# Patient Record
Sex: Female | Born: 1950 | Race: White | Hispanic: No | Marital: Married | Smoking: Current some day smoker
Health system: Southern US, Community
[De-identification: ages and names within clinical notes are randomized; demographics above are authoritative.]

## PROBLEM LIST (undated history)

## (undated) DIAGNOSIS — I5032 Chronic diastolic (congestive) heart failure: Secondary | ICD-10-CM

## (undated) DIAGNOSIS — K219 Gastro-esophageal reflux disease without esophagitis: Secondary | ICD-10-CM

## (undated) DIAGNOSIS — E1340 Other specified diabetes mellitus with diabetic neuropathy, unspecified: Secondary | ICD-10-CM

## (undated) DIAGNOSIS — J387 Other diseases of larynx: Secondary | ICD-10-CM

## (undated) DIAGNOSIS — D497 Neoplasm of unspecified behavior of endocrine glands and other parts of nervous system: Secondary | ICD-10-CM

## (undated) DIAGNOSIS — R112 Nausea with vomiting, unspecified: Secondary | ICD-10-CM

## (undated) DIAGNOSIS — N186 End stage renal disease: Secondary | ICD-10-CM

## (undated) DIAGNOSIS — E1122 Type 2 diabetes mellitus with diabetic chronic kidney disease: Secondary | ICD-10-CM

## (undated) DIAGNOSIS — I509 Heart failure, unspecified: Secondary | ICD-10-CM

## (undated) DIAGNOSIS — E1121 Type 2 diabetes mellitus with diabetic nephropathy: Secondary | ICD-10-CM

## (undated) DIAGNOSIS — E785 Hyperlipidemia, unspecified: Secondary | ICD-10-CM

## (undated) DIAGNOSIS — Z9889 Other specified postprocedural states: Secondary | ICD-10-CM

## (undated) DIAGNOSIS — D649 Anemia, unspecified: Secondary | ICD-10-CM

## (undated) DIAGNOSIS — I700 Atherosclerosis of aorta: Secondary | ICD-10-CM

## (undated) DIAGNOSIS — G4730 Sleep apnea, unspecified: Secondary | ICD-10-CM

## (undated) DIAGNOSIS — E1140 Type 2 diabetes mellitus with diabetic neuropathy, unspecified: Secondary | ICD-10-CM

## (undated) DIAGNOSIS — I350 Nonrheumatic aortic (valve) stenosis: Secondary | ICD-10-CM

## (undated) DIAGNOSIS — Z973 Presence of spectacles and contact lenses: Secondary | ICD-10-CM

## (undated) DIAGNOSIS — Z992 Dependence on renal dialysis: Secondary | ICD-10-CM

## (undated) DIAGNOSIS — I4891 Unspecified atrial fibrillation: Principal | ICD-10-CM

## (undated) DIAGNOSIS — I10 Essential (primary) hypertension: Secondary | ICD-10-CM

## (undated) DIAGNOSIS — I2119 ST elevation (STEMI) myocardial infarction involving other coronary artery of inferior wall: Secondary | ICD-10-CM

## (undated) DIAGNOSIS — E13319 Other specified diabetes mellitus with unspecified diabetic retinopathy without macular edema: Secondary | ICD-10-CM

## (undated) DIAGNOSIS — I2510 Atherosclerotic heart disease of native coronary artery without angina pectoris: Secondary | ICD-10-CM

## (undated) DIAGNOSIS — J449 Chronic obstructive pulmonary disease, unspecified: Secondary | ICD-10-CM

## (undated) DIAGNOSIS — IMO0001 Reserved for inherently not codable concepts without codable children: Secondary | ICD-10-CM

## (undated) HISTORY — DX: Other diseases of larynx: J38.7

## (undated) HISTORY — PX: ACHILLES TENDON REPAIR: SUR1153

## (undated) HISTORY — PX: OTHER SURGICAL HISTORY: SHX169

## (undated) HISTORY — DX: Hyperlipidemia, unspecified: E78.5

## (undated) HISTORY — DX: Essential (primary) hypertension: I10

## (undated) HISTORY — PX: SHOULDER ARTHROSCOPY: SHX128

## (undated) HISTORY — DX: Anemia, unspecified: D64.9

## (undated) HISTORY — PX: APPENDECTOMY: SHX54

## (undated) HISTORY — PX: LARYNGECTOMY: SUR815

## (undated) HISTORY — PX: CARDIAC CATHETERIZATION: SHX172

## (undated) HISTORY — DX: Heart failure, unspecified: I50.9

## (undated) HISTORY — DX: Type 2 diabetes mellitus with diabetic nephropathy: E11.21

## (undated) HISTORY — PX: HEMORRHOID SURGERY: SHX153

## (undated) HISTORY — PX: CHOLECYSTECTOMY: SHX55

## (undated) HISTORY — PX: LUMBAR LAMINECTOMY: SHX95

## (undated) HISTORY — DX: Chronic obstructive pulmonary disease, unspecified: J44.9

---

## 1998-05-30 ENCOUNTER — Ambulatory Visit (HOSPITAL_BASED_OUTPATIENT_CLINIC_OR_DEPARTMENT_OTHER): Admission: RE | Admit: 1998-05-30 | Discharge: 1998-05-30 | Payer: Self-pay | Admitting: *Deleted

## 1999-06-26 ENCOUNTER — Encounter: Admission: RE | Admit: 1999-06-26 | Discharge: 1999-06-26 | Payer: Self-pay | Admitting: *Deleted

## 1999-06-28 ENCOUNTER — Inpatient Hospital Stay (HOSPITAL_COMMUNITY): Admission: RE | Admit: 1999-06-28 | Discharge: 1999-06-29 | Payer: Self-pay | Admitting: *Deleted

## 2001-07-29 ENCOUNTER — Ambulatory Visit (HOSPITAL_COMMUNITY): Admission: RE | Admit: 2001-07-29 | Discharge: 2001-07-29 | Payer: Self-pay | Admitting: Orthopedic Surgery

## 2001-07-29 ENCOUNTER — Encounter: Payer: Self-pay | Admitting: Orthopedic Surgery

## 2002-07-12 ENCOUNTER — Ambulatory Visit (HOSPITAL_BASED_OUTPATIENT_CLINIC_OR_DEPARTMENT_OTHER): Admission: RE | Admit: 2002-07-12 | Discharge: 2002-07-12 | Payer: Self-pay | Admitting: Orthopedic Surgery

## 2007-11-16 ENCOUNTER — Ambulatory Visit: Payer: Self-pay | Admitting: Cardiology

## 2007-12-16 ENCOUNTER — Ambulatory Visit: Payer: Self-pay | Admitting: Cardiology

## 2009-03-21 ENCOUNTER — Ambulatory Visit (HOSPITAL_COMMUNITY): Admission: RE | Admit: 2009-03-21 | Discharge: 2009-03-21 | Payer: Self-pay | Admitting: Family Medicine

## 2011-01-03 NOTE — Op Note (Signed)
NAME:  Anita [name], Anita [name]                          ACCOUNT NO.:  [id number]   MEDICAL RECORD NO.:  [id number]                   PATIENT TYPE:  AMB   LOCATION:  DSC                                  FACILITY:  [facility]   PHYSICIAN:  Leonides Grills, M.D.                  DATE OF BIRTH:  08/17/1951   DATE OF PROCEDURE:  DATE OF DISCHARGE:                                 OPERATIVE REPORT   PREOPERATIVE DIAGNOSIS:  Right Achilles tendinopathy.   POSTOPERATIVE DIAGNOSIS:  Same.   OPERATION:  1. Right FHL to calcaneus transfer.  2. Right FDL to FHL transfer.  3. Right Achilles tendon scarification.   ANESTHESIA:  General endotracheal tube.   SURGEON:  Dr. Leonides Grills.   ASSISTANT:  Lianne Cure, P.A.-C.   ESTIMATED BLOOD LOSS:  Minimal.   TOURNIQUET TIME:  One hour and 15 minutes.   COMPLICATIONS:  None.   DISPOSITION:  Stable to PR.   INDICATIONS FOR PROCEDURE:  This is a 60 year old female who has had  persistent, long-standing Achilles tendinopathy that was resistant to  conservative management.  She was consented for the above procedure.  All  risks which include infection, neurovascular injury, persistent pain,  worsening of pain, rupture of the tendon, stiffness, contracture were all  explained, questions were encouraged and answered.   DESCRIPTION OF PROCEDURE:  The patient was brought to the operating room and  placed in the supine position after adequate general endotracheal tube  anesthesia was administered as well as Ancef one gram IV piggyback.  A  tourniquet was placed on the proximal thigh, right lower extremity, and the  patient was then placed in the lateral decubitus position with the operative  side down.  All bony prominences were well padded.  Axillary roll was  placed.  We then prepped and draped the left lower extremity in the sterile  manner.  Gravity exsanguinated the left lower extremity.  The tourniquet was  elevated to 290 mmHg.  A longitudinal  incision over the anteromedial border  of the Achilles tendon was then made.  Dissection was carried down to the  Achilles tendon.  The tendon was then identified and was stenosed, we then  scarified the tendon. Once the tendon was scarified, with longitudinal slits  in the Achilles tendon and debridement, we then identified the FHL tendon  and released the fascia posteriorly to the tendon.  Hemostasis was obtained  during the exposure.  We then made a longitudinal incision over the  posterior tibial tendon, posterior tibial tendon was then identified.  FDL  tendon was then identified and traced to knot of Sherilyn Cooter.  At this point, both  the FHL and FDL tendons were identified, and with the foot in neutral  dorsiflexion as well as toes in neutral dorsiflexion, we then transferred to  the FDL to FHL tendon with 2-0 fiber wire.  After this was done,  we then  tenotomized the FHL tendon in the wound, proximal to the knot of the  transfer, and pulled this through the more proximal wound.  We then  identified the adequate amount of tension on the FHL once this was elevated  off the posterior aspect of the tibia bluntly.  We then drilled a hole in  the calcaneal tuber midline, just anterior to the Achilles tendon.  We then  placed fiber wire in a running manner, both on the medial and lateral  aspects of the FHL tendon respectively.  We then placed the tendon in the  hole with the ankle in equinus, with the adequate amount of tension on the  tendon, and this was then secured and tensioned using an [employer] tenodesis  9 mm bioabsorbable screw.  This had excellent purchase and tension on the  Achilles tendon.  Muscle belly was anterior to the Achilles tendon for  better blood supply.  The paratenon of the Achilles tendon was then sewn to  the epimysium with 4-0 PDS.  Ankle was then ranged and had excellent  maintenance of the FHL to the anterior aspect of the Achilles tendon and  scarified area, and  did not have any contracture.  The wounds were copiously  irrigated with normal saline.  Subcu was closed with 3-0 Vicryl.  Skin was  closed with 4-0 nylon.  Tourniquet was deflated prior to closure and  hemostasis was obtained.  Sterile dressings were applied, modified Achilles  dressing was applied with the ankle in gravity equinus.  The patient was  stable to the PR.                                               Leonides Grills, M.D.    PB/MEDQ  D:  07/12/2002  T:  07/12/2002  Job:  [id number]

## 2011-06-06 ENCOUNTER — Encounter (INDEPENDENT_AMBULATORY_CARE_PROVIDER_SITE_OTHER): Payer: Self-pay | Admitting: Ophthalmology

## 2011-06-06 DIAGNOSIS — H2510 Age-related nuclear cataract, unspecified eye: Secondary | ICD-10-CM

## 2011-06-06 DIAGNOSIS — E11319 Type 2 diabetes mellitus with unspecified diabetic retinopathy without macular edema: Secondary | ICD-10-CM

## 2011-06-06 DIAGNOSIS — H43819 Vitreous degeneration, unspecified eye: Secondary | ICD-10-CM

## 2011-08-20 ENCOUNTER — Ambulatory Visit (HOSPITAL_COMMUNITY)
Admission: RE | Admit: 2011-08-20 | Discharge: 2011-08-20 | Disposition: A | Discharge status: Home / Self Care | Payer: Medicare Other | Source: Ambulatory Visit | Attending: Family Medicine | Admitting: Family Medicine

## 2011-08-20 DIAGNOSIS — R011 Cardiac murmur, unspecified: Secondary | ICD-10-CM | POA: Insufficient documentation

## 2011-08-20 DIAGNOSIS — I359 Nonrheumatic aortic valve disorder, unspecified: Secondary | ICD-10-CM | POA: Insufficient documentation

## 2011-08-20 NOTE — Progress Notes (Signed)
*  PRELIMINARY RESULTS* Echocardiogram 2D Echocardiogram has been performed.  Anita [name] 08/20/2011, 9:37 AM

## 2011-10-22 DIAGNOSIS — E669 Obesity, unspecified: Secondary | ICD-10-CM | POA: Insufficient documentation

## 2011-12-05 ENCOUNTER — Ambulatory Visit (INDEPENDENT_AMBULATORY_CARE_PROVIDER_SITE_OTHER): Payer: Medicare Other | Admitting: Ophthalmology

## 2011-12-05 DIAGNOSIS — H35039 Hypertensive retinopathy, unspecified eye: Secondary | ICD-10-CM

## 2011-12-05 DIAGNOSIS — H43819 Vitreous degeneration, unspecified eye: Secondary | ICD-10-CM

## 2011-12-05 DIAGNOSIS — H2510 Age-related nuclear cataract, unspecified eye: Secondary | ICD-10-CM

## 2011-12-05 DIAGNOSIS — E11319 Type 2 diabetes mellitus with unspecified diabetic retinopathy without macular edema: Secondary | ICD-10-CM

## 2011-12-05 DIAGNOSIS — I10 Essential (primary) hypertension: Secondary | ICD-10-CM

## 2011-12-05 DIAGNOSIS — E1139 Type 2 diabetes mellitus with other diabetic ophthalmic complication: Secondary | ICD-10-CM

## 2011-12-05 DIAGNOSIS — H43319 Vitreous membranes and strands, unspecified eye: Secondary | ICD-10-CM

## 2012-05-19 ENCOUNTER — Ambulatory Visit (INDEPENDENT_AMBULATORY_CARE_PROVIDER_SITE_OTHER): Payer: Medicare Other | Admitting: Ophthalmology

## 2012-11-30 ENCOUNTER — Other Ambulatory Visit (HOSPITAL_COMMUNITY): Payer: Self-pay | Admitting: Nephrology

## 2012-11-30 DIAGNOSIS — R011 Cardiac murmur, unspecified: Secondary | ICD-10-CM

## 2012-12-01 ENCOUNTER — Other Ambulatory Visit: Payer: Self-pay | Admitting: *Deleted

## 2012-12-01 ENCOUNTER — Other Ambulatory Visit (HOSPITAL_COMMUNITY): Payer: Self-pay | Admitting: Nephrology

## 2012-12-01 DIAGNOSIS — Z01810 Encounter for preprocedural cardiovascular examination: Secondary | ICD-10-CM

## 2012-12-01 DIAGNOSIS — N184 Chronic kidney disease, stage 4 (severe): Secondary | ICD-10-CM

## 2012-12-02 ENCOUNTER — Encounter: Payer: Self-pay | Admitting: Vascular Surgery

## 2012-12-03 ENCOUNTER — Ambulatory Visit (HOSPITAL_COMMUNITY)
Admission: RE | Admit: 2012-12-03 | Discharge: 2012-12-03 | Disposition: A | Discharge status: Home / Self Care | Payer: Medicare Other | Source: Ambulatory Visit | Attending: Nephrology | Admitting: Nephrology

## 2012-12-03 DIAGNOSIS — E119 Type 2 diabetes mellitus without complications: Secondary | ICD-10-CM | POA: Insufficient documentation

## 2012-12-03 DIAGNOSIS — R011 Cardiac murmur, unspecified: Secondary | ICD-10-CM | POA: Insufficient documentation

## 2012-12-03 DIAGNOSIS — I359 Nonrheumatic aortic valve disorder, unspecified: Secondary | ICD-10-CM

## 2012-12-03 DIAGNOSIS — I509 Heart failure, unspecified: Secondary | ICD-10-CM | POA: Insufficient documentation

## 2012-12-03 NOTE — Progress Notes (Signed)
*  PRELIMINARY RESULTS* Echocardiogram 2D Echocardiogram has been performed.  Anita [name] 12/03/2012, 10:10 AM

## 2012-12-22 ENCOUNTER — Encounter: Payer: Self-pay | Admitting: Vascular Surgery

## 2012-12-23 ENCOUNTER — Encounter (INDEPENDENT_AMBULATORY_CARE_PROVIDER_SITE_OTHER): Payer: Medicare Other

## 2012-12-23 ENCOUNTER — Other Ambulatory Visit: Payer: Self-pay

## 2012-12-23 ENCOUNTER — Encounter: Payer: Self-pay | Admitting: Vascular Surgery

## 2012-12-23 ENCOUNTER — Encounter (HOSPITAL_COMMUNITY): Payer: Self-pay | Admitting: Pharmacy Technician

## 2012-12-23 ENCOUNTER — Ambulatory Visit (INDEPENDENT_AMBULATORY_CARE_PROVIDER_SITE_OTHER): Payer: Medicare Other | Admitting: Vascular Surgery

## 2012-12-23 VITALS — BP 188/60 | HR 59 | Resp 16 | Ht 66.5 in | Wt 243.0 lb

## 2012-12-23 DIAGNOSIS — N184 Chronic kidney disease, stage 4 (severe): Secondary | ICD-10-CM

## 2012-12-23 DIAGNOSIS — I350 Nonrheumatic aortic (valve) stenosis: Secondary | ICD-10-CM

## 2012-12-23 DIAGNOSIS — Z01810 Encounter for preprocedural cardiovascular examination: Secondary | ICD-10-CM

## 2012-12-23 DIAGNOSIS — N289 Disorder of kidney and ureter, unspecified: Secondary | ICD-10-CM

## 2012-12-23 DIAGNOSIS — I359 Nonrheumatic aortic valve disorder, unspecified: Secondary | ICD-10-CM

## 2012-12-23 DIAGNOSIS — N186 End stage renal disease: Secondary | ICD-10-CM

## 2012-12-23 HISTORY — DX: Nonrheumatic aortic (valve) stenosis: I35.0

## 2012-12-23 NOTE — Progress Notes (Signed)
[facility] [place] HISTORY AND PHYSICAL  Referring: Hyman Hopes M.D. History of Present Illness:  Patient is a 62 y.o. year old female who presents for placement of a permanent hemodialysis access. The patient is right handed .  The patient is not currently on hemodialysis.  The cause of renal failure is thought to be secondary to diabetes.  Other chronic medical problems include peripheral neuropathy, hypertension, hyperlipidemia COPD, ll of which are currently stable. The patient has been experiencing some fatigue recently. She has also had some slight decrease of appetite. She denies any skin itching. She is not currently on hemodialysis  Past Medical History  Diagnosis Date  . Peripheral neuropathy   . Heart murmur   . ESRD (end stage renal disease)     Chronic Renal Insufficiency  . Diabetic nephropathy   . Diabetes mellitus without complication   . Proteinuria     Progression  . Hypertension   . Anemia   . Dyslipidemia   . Laryngeal mass   . COPD (chronic obstructive pulmonary disease)   . CHF (congestive heart failure)     Past Surgical History  Procedure Laterality Date  . Cholecystectomy    . Appendectomy    . Shoulder arthroscopy Right     w/ repair of rotator cuff repair  . Elbow tendon surgery Right   . Achilles tendon repair Right   . Cesarean section      X 2      Social History History  Substance Use Topics  . Smoking status: Current Every Day Smoker -- 1.00 packs/day for 40 years    Types: Cigarettes  . Smokeless tobacco: Never Used  . Alcohol Use: No    Family History Family History  Problem Relation Age of Onset  . COPD Mother   . Multiple sclerosis Father   . Depression Brother     Suicide  . Heart disease Brother     Allergies  Allergies  Allergen Reactions  . Doxycycline   . Lipitor (Atorvastatin)      Current Outpatient Prescriptions  Medication Sig Dispense Refill  . atenolol (TENORMIN) 50 MG tablet Take 50 mg  by mouth daily.      . calcitRIOL (ROCALTROL) 0.25 MCG capsule Take 0.25 mcg by mouth daily.      Marland Kitchen Cinnamon ([employer] CINNAMON) 500 MG capsule Take 500 mg by mouth daily.      . cloNIDine (CATAPRES) 0.2 MG tablet Take 0.2 mg by mouth 2 (two) times daily.      . Cyanocobalamin (VITAMIN B 12 PO) Take 1,000 mg by mouth daily.      . folic acid (FOLVITE) 800 MCG tablet Take 400 mcg by mouth daily.      . hydrALAZINE (APRESOLINE) 25 MG tablet Take 25 mg by mouth 2 (two) times daily.       Marland Kitchen HYDROcodone-acetaminophen (LORTAB) 10-500 MG per tablet Take 1 tablet by mouth every 6 (six) hours as needed for pain.      Marland Kitchen lisinopril (PRINIVIL,ZESTRIL) 20 MG tablet Take 20 mg by mouth daily.      Marland Kitchen LORazepam (ATIVAN) 1 MG tablet Take 1 mg by mouth every 8 (eight) hours.      Marland Kitchen omeprazole (PRILOSEC) 20 MG capsule Take 20 mg by mouth daily.      . paricalcitol (ZEMPLAR) 1 MCG capsule Take 1 mcg by mouth daily.      . saxagliptin HCl (ONGLYZA) 5 MG TABS tablet Take by mouth daily.      Marland Kitchen  sodium bicarbonate 650 MG tablet Take 650 mg by mouth 4 (four) times daily.      . Vitamin D, Ergocalciferol, (DRISDOL) 50000 UNITS CAPS Take 50,000 Units by mouth.      . zolpidem (AMBIEN) 10 MG tablet Take 10 mg by mouth at bedtime as needed for sleep.       No current facility-administered medications for this visit.    ROS:   General:  No weight loss, Fever, chills  HEENT: No recent headaches, no nasal bleeding, no visual changes, no sore throat  Neurologic: No dizziness, blackouts, seizures. No recent symptoms of stroke or mini- stroke. No recent episodes of slurred speech, or temporary blindness.  Cardiac: No recent episodes of chest pain/pressure, no shortness of breath at rest.  + shortness of breath with exertion.  Denies history of atrial fibrillation or irregular heartbeat  Vascular: No history of rest pain in feet.  No history of claudication.  No history of non-healing ulcer, No history of DVT   Pulmonary: No  home oxygen, no productive cough, no hemoptysis,  No asthma or wheezing  Musculoskeletal:  [ ]  Arthritis, [ ]  Low back pain,  [ ]  Joint pain  Hematologic:No history of hypercoagulable state.  No history of easy bleeding.  No history of anemia  Gastrointestinal: No hematochezia or melena,  No gastroesophageal reflux, no trouble swallowing  Urinary: [x ] chronic Kidney disease, [ ]  on HD - [ ]  MWF or [ ]  TTHS, [ ]  Burning with urination, [ ]  Frequent urination, [ ]  Difficulty urinating;   Skin: No rashes  Psychological: No history of anxiety,  No history of depression   Physical Examination  Filed Vitals:   12/23/12 1256  BP: 188/60  Pulse: 59  Resp: 16  Height: 5' 6.5" (1.689 m)  Weight: 243 lb (110.224 kg)  SpO2: 99%    Body mass index is 38.64 kg/(m^2).  General:  Alert and oriented, no acute distress HEENT: Normal Neck: No bruit or JVD Pulmonary: Clear to auscultation bilaterally Cardiac: Regular Rate and Rhythm with 3/6 systolic murmur heard throughout the precordium Gastrointestinal: Soft, non-tender, non-distended, no mass, obese Skin: No rash Extremity Pulses:  2+ radial, brachial pulses bilaterally Musculoskeletal: No deformity trace ankle edema  Neurologic: Upper and lower extremity motor 5/5 and symmetric  DATA: Patient had bilateral vein mapping ultrasound today. I reviewed and interpreted this study. The cephalic vein in the forearm is fairly small. It is of good quality and the upper arm bilaterally greater than 4 mm in diameter. The basilic vein in the right is also greater than 4 mm the basilic vein in the left is fairly small  Recent echocardiogram results were reviewed which shows aortic stenosis with mild regurgitation normal ejection fraction some diastolic dysfunction   ASSESSMENT: Needs hemodialysis access.   PLAN:  Left brachiocephalic AV fistula Wednesday, 12/29/2012.  Risks benefits possible complications and procedure details the fistula  placement including but not limited to non-maturation ischemic steal bleeding and infection were explained to the patient today.  She understands and agrees to proceed.  Fabienne Bruns, MD [facility] [place] Office: [phone] Pager: [phone]

## 2012-12-28 ENCOUNTER — Encounter (HOSPITAL_COMMUNITY)
Admission: RE | Admit: 2012-12-28 | Discharge: 2012-12-28 | Disposition: A | Discharge status: Home / Self Care | Payer: Medicare Other | Source: Ambulatory Visit | Attending: Vascular Surgery | Admitting: Vascular Surgery

## 2012-12-28 ENCOUNTER — Encounter (HOSPITAL_COMMUNITY): Payer: Self-pay

## 2012-12-28 ENCOUNTER — Ambulatory Visit (HOSPITAL_COMMUNITY)
Admission: RE | Admit: 2012-12-28 | Discharge: 2012-12-28 | Disposition: A | Discharge status: Home / Self Care | Payer: Medicare Other | Source: Ambulatory Visit | Attending: Anesthesiology | Admitting: Anesthesiology

## 2012-12-28 HISTORY — DX: Nausea with vomiting, unspecified: R11.2

## 2012-12-28 HISTORY — DX: Other specified postprocedural states: Z98.890

## 2012-12-28 LAB — BASIC METABOLIC PANEL
BUN: 47 mg/dL — ABNORMAL HIGH (ref 6–23)
Calcium: 7.9 mg/dL — ABNORMAL LOW (ref 8.4–10.5)
Chloride: 108 mEq/L (ref 96–112)
Creatinine, Ser: 4.49 mg/dL — ABNORMAL HIGH (ref 0.50–1.10)
GFR calc Af Amer: 11 mL/min — ABNORMAL LOW (ref >90)

## 2012-12-28 LAB — CBC
HCT: 31.5 % — ABNORMAL LOW (ref 36.0–46.0)
MCHC: 32.7 g/dL (ref 30.0–36.0)
Platelets: 187 10*3/uL (ref 150–400)
RDW: 14.1 % (ref 11.5–15.5)

## 2012-12-28 LAB — SURGICAL PCR SCREEN: MRSA, PCR: NEGATIVE

## 2012-12-28 MED ORDER — DEXTROSE 5 % IV SOLN
1.5000 g | INTRAVENOUS | Status: AC
  Administered 2012-12-29: 1.5 g via INTRAVENOUS
  Filled 2012-12-28: qty 1.5

## 2012-12-28 NOTE — Pre-Procedure Instructions (Signed)
Anita [name]  12/28/2012   Your procedure is scheduled on:  Dec 29, 2012  Report to [facility] at 8:30 AM.  Call this number if you have problems the morning of surgery: [phone]   Remember:   Do not eat food or drink liquids after midnight.   Take these medicines the morning of surgery with A SIP OF WATER: atenolol (TENORMIN) 50 MG tablet, cloNIDine (CATAPRES) 0.2 MG tablet, hydrALAZINE (APRESOLINE) 25 MG tablet, HYDROcodone-acetaminophen (NORCO) 7.5-325 MG per tablet, LORazepam (ATIVAN) 1 MG tablet, omeprazole (PRILOSEC) 20 MG capsule   Do not wear jewelry, make-up or nail polish.  Do not wear lotions, powders, or perfumes. You may wear deodorant.  Do not shave 48 hours prior to surgery. Men may shave face and neck.  Do not bring valuables to the hospital.  Contacts, dentures or bridgework may not be worn into surgery.  Leave suitcase in the car. After surgery it may be brought to your room.  For patients admitted to the hospital, checkout time is 11:00 AM the day of  discharge.   Patients discharged the day of surgery will not be allowed to drive  home.  Name and phone number of your driver:   Special Instructions: Shower using CHG 2 nights before surgery and the night before surgery.  If you shower the day of surgery use CHG.  Use special wash - you have one bottle of CHG for all showers.  You should use approximately 1/3 of the bottle for each shower.   Please read over the following fact sheets that you were given: Pain Booklet, Coughing and Deep Breathing and Surgical Site Infection Prevention

## 2012-12-29 ENCOUNTER — Telehealth: Payer: Self-pay | Admitting: Vascular Surgery

## 2012-12-29 ENCOUNTER — Encounter (HOSPITAL_COMMUNITY): Payer: Self-pay | Admitting: Anesthesiology

## 2012-12-29 ENCOUNTER — Encounter (HOSPITAL_COMMUNITY)
Admission: RE | Disposition: A | Discharge status: Home / Self Care | Payer: Self-pay | Source: Ambulatory Visit | Attending: Vascular Surgery

## 2012-12-29 ENCOUNTER — Ambulatory Visit (HOSPITAL_COMMUNITY)
Admission: RE | Admit: 2012-12-29 | Discharge: 2012-12-29 | Disposition: A | Discharge status: Home / Self Care | Payer: Medicare Other | Source: Ambulatory Visit | Attending: Vascular Surgery | Admitting: Vascular Surgery

## 2012-12-29 ENCOUNTER — Ambulatory Visit (HOSPITAL_COMMUNITY): Payer: Medicare Other | Admitting: Anesthesiology

## 2012-12-29 ENCOUNTER — Other Ambulatory Visit: Payer: Self-pay | Admitting: *Deleted

## 2012-12-29 DIAGNOSIS — E785 Hyperlipidemia, unspecified: Secondary | ICD-10-CM | POA: Insufficient documentation

## 2012-12-29 DIAGNOSIS — N058 Unspecified nephritic syndrome with other morphologic changes: Secondary | ICD-10-CM | POA: Insufficient documentation

## 2012-12-29 DIAGNOSIS — N186 End stage renal disease: Secondary | ICD-10-CM

## 2012-12-29 DIAGNOSIS — J449 Chronic obstructive pulmonary disease, unspecified: Secondary | ICD-10-CM | POA: Insufficient documentation

## 2012-12-29 DIAGNOSIS — G609 Hereditary and idiopathic neuropathy, unspecified: Secondary | ICD-10-CM | POA: Insufficient documentation

## 2012-12-29 DIAGNOSIS — F17200 Nicotine dependence, unspecified, uncomplicated: Secondary | ICD-10-CM | POA: Insufficient documentation

## 2012-12-29 DIAGNOSIS — I120 Hypertensive chronic kidney disease with stage 5 chronic kidney disease or end stage renal disease: Secondary | ICD-10-CM | POA: Insufficient documentation

## 2012-12-29 DIAGNOSIS — J4489 Other specified chronic obstructive pulmonary disease: Secondary | ICD-10-CM | POA: Insufficient documentation

## 2012-12-29 DIAGNOSIS — I509 Heart failure, unspecified: Secondary | ICD-10-CM | POA: Insufficient documentation

## 2012-12-29 DIAGNOSIS — Z4931 Encounter for adequacy testing for hemodialysis: Secondary | ICD-10-CM

## 2012-12-29 DIAGNOSIS — E1129 Type 2 diabetes mellitus with other diabetic kidney complication: Secondary | ICD-10-CM | POA: Insufficient documentation

## 2012-12-29 DIAGNOSIS — N289 Disorder of kidney and ureter, unspecified: Secondary | ICD-10-CM

## 2012-12-29 DIAGNOSIS — R011 Cardiac murmur, unspecified: Secondary | ICD-10-CM | POA: Insufficient documentation

## 2012-12-29 HISTORY — PX: AV FISTULA PLACEMENT: SHX1204

## 2012-12-29 LAB — POCT I-STAT 4, (NA,K, GLUC, HGB,HCT): Potassium: 4.6 mEq/L (ref 3.5–5.1)

## 2012-12-29 SURGERY — ARTERIOVENOUS (AV) FISTULA CREATION
Anesthesia: Monitor Anesthesia Care | Site: Arm Upper | Laterality: Left | Wound class: Clean

## 2012-12-29 MED ORDER — CEFUROXIME SODIUM 750 MG IJ SOLR
INTRAMUSCULAR | Status: AC
  Filled 2012-12-29: qty 1500

## 2012-12-29 MED ORDER — OXYCODONE HCL 5 MG PO TABS
ORAL_TABLET | ORAL | Status: AC
  Administered 2012-12-29: 5 mg via ORAL
  Filled 2012-12-29: qty 1

## 2012-12-29 MED ORDER — 0.9 % SODIUM CHLORIDE (POUR BTL) OPTIME
TOPICAL | Status: DC | PRN
  Administered 2012-12-29: 1000 mL

## 2012-12-29 MED ORDER — SODIUM CHLORIDE 0.9 % IV SOLN
INTRAVENOUS | Status: DC | PRN
  Administered 2012-12-29: 12:00:00 via INTRAVENOUS

## 2012-12-29 MED ORDER — HEPARIN SODIUM (PORCINE) 1000 UNIT/ML IJ SOLN
INTRAMUSCULAR | Status: DC | PRN
  Administered 2012-12-29: 5000 [IU] via INTRAVENOUS

## 2012-12-29 MED ORDER — THROMBIN 20000 UNITS EX SOLR
CUTANEOUS | Status: AC
  Filled 2012-12-29: qty 20000

## 2012-12-29 MED ORDER — HYDROCODONE-ACETAMINOPHEN 7.5-325 MG PO TABS: 1.0000 | ORAL_TABLET | Freq: Four times a day (QID) | ORAL | Status: DC | PRN

## 2012-12-29 MED ORDER — MIDAZOLAM HCL 5 MG/5ML IJ SOLN
INTRAMUSCULAR | Status: DC | PRN
  Administered 2012-12-29 (×2): 1 mg via INTRAVENOUS

## 2012-12-29 MED ORDER — SODIUM CHLORIDE 0.9 % IR SOLN
Status: DC | PRN
  Administered 2012-12-29: 13:00:00

## 2012-12-29 MED ORDER — FENTANYL CITRATE 0.05 MG/ML IJ SOLN
INTRAMUSCULAR | Status: DC | PRN
  Administered 2012-12-29 (×4): 50 ug via INTRAVENOUS

## 2012-12-29 MED ORDER — HYDROMORPHONE HCL PF 1 MG/ML IJ SOLN: 0.2500 mg | INTRAMUSCULAR | Status: DC | PRN

## 2012-12-29 MED ORDER — OXYCODONE HCL 5 MG PO TABS: 5.0000 mg | ORAL_TABLET | Freq: Once | ORAL | Status: AC | PRN

## 2012-12-29 MED ORDER — ONDANSETRON HCL 4 MG/2ML IJ SOLN
INTRAMUSCULAR | Status: DC | PRN
  Administered 2012-12-29: 4 mg via INTRAVENOUS

## 2012-12-29 MED ORDER — LIDOCAINE HCL (PF) 1 % IJ SOLN
INTRAMUSCULAR | Status: DC | PRN
  Administered 2012-12-29: 30 mL via INTRADERMAL

## 2012-12-29 MED ORDER — PROMETHAZINE HCL 25 MG/ML IJ SOLN: 6.2500 mg | INTRAMUSCULAR | Status: DC | PRN

## 2012-12-29 MED ORDER — LIDOCAINE HCL (PF) 1 % IJ SOLN
INTRAMUSCULAR | Status: AC
  Filled 2012-12-29: qty 30

## 2012-12-29 MED ORDER — OXYCODONE HCL 5 MG/5ML PO SOLN: 5.0000 mg | Freq: Once | ORAL | Status: AC | PRN

## 2012-12-29 MED ORDER — PROPOFOL INFUSION 10 MG/ML OPTIME
INTRAVENOUS | Status: DC | PRN
  Administered 2012-12-29: 25 ug/kg/min via INTRAVENOUS

## 2012-12-29 MED ORDER — SODIUM CHLORIDE 0.9 % IV SOLN
INTRAVENOUS | Status: DC
  Administered 2012-12-29: 12:00:00 via INTRAVENOUS

## 2012-12-29 SURGICAL SUPPLY — 40 items
ADH SKN CLS APL DERMABOND .7 (GAUZE/BANDAGES/DRESSINGS) ×1
ARMBAND PINK RESTRICT EXTREMIT (MISCELLANEOUS) ×2 IMPLANT
CANISTER SUCTION 2500CC (MISCELLANEOUS) ×2 IMPLANT
CLIP TI MEDIUM 6 (CLIP) ×2 IMPLANT
CLIP TI WIDE RED SMALL 6 (CLIP) ×2 IMPLANT
CLOTH BEACON ORANGE TIMEOUT ST (SAFETY) ×2 IMPLANT
COVER PROBE W GEL 5X96 (DRAPES) ×2 IMPLANT
COVER SURGICAL LIGHT HANDLE (MISCELLANEOUS) ×2 IMPLANT
DECANTER SPIKE VIAL GLASS SM (MISCELLANEOUS) ×2 IMPLANT
DERMABOND ADVANCED (GAUZE/BANDAGES/DRESSINGS) ×1
DERMABOND ADVANCED .7 DNX12 (GAUZE/BANDAGES/DRESSINGS) ×1 IMPLANT
DRAIN PENROSE 1/4X12 LTX STRL (WOUND CARE) ×2 IMPLANT
ELECT REM PT RETURN 9FT ADLT (ELECTROSURGICAL) ×2
ELECTRODE REM PT RTRN 9FT ADLT (ELECTROSURGICAL) ×1 IMPLANT
GEL ULTRASOUND 20GR AQUASONIC (MISCELLANEOUS) IMPLANT
GLOVE BIO SURGEON STRL SZ 6.5 (GLOVE) ×2 IMPLANT
GLOVE BIO SURGEON STRL SZ7.5 (GLOVE) ×2 IMPLANT
GLOVE BIOGEL PI IND STRL 6 (GLOVE) IMPLANT
GLOVE BIOGEL PI IND STRL 6.5 (GLOVE) IMPLANT
GLOVE BIOGEL PI INDICATOR 6 (GLOVE) ×1
GLOVE BIOGEL PI INDICATOR 6.5 (GLOVE) ×2
GLOVE SURG SS PI 6.5 STRL IVOR (GLOVE) ×1 IMPLANT
GOWN PREVENTION PLUS XLARGE (GOWN DISPOSABLE) ×2 IMPLANT
GOWN STRL NON-REIN LRG LVL3 (GOWN DISPOSABLE) ×4 IMPLANT
KIT BASIN OR (CUSTOM PROCEDURE TRAY) ×2 IMPLANT
KIT ROOM TURNOVER OR (KITS) ×2 IMPLANT
LOOP VESSEL MINI RED (MISCELLANEOUS) IMPLANT
NS IRRIG 1000ML POUR BTL (IV SOLUTION) ×2 IMPLANT
PACK CV ACCESS (CUSTOM PROCEDURE TRAY) ×2 IMPLANT
PAD ARMBOARD 7.5X6 YLW CONV (MISCELLANEOUS) ×4 IMPLANT
SPONGE SURGIFOAM ABS GEL 100 (HEMOSTASIS) IMPLANT
SUT PROLENE 6 0 CC (SUTURE) ×1 IMPLANT
SUT PROLENE 7 0 BV 1 (SUTURE) ×2 IMPLANT
SUT VIC AB 3-0 SH 27 (SUTURE) ×2
SUT VIC AB 3-0 SH 27X BRD (SUTURE) ×1 IMPLANT
SUT VICRYL 4-0 PS2 18IN ABS (SUTURE) ×2 IMPLANT
TOWEL OR 17X24 6PK STRL BLUE (TOWEL DISPOSABLE) ×2 IMPLANT
TOWEL OR 17X26 10 PK STRL BLUE (TOWEL DISPOSABLE) ×2 IMPLANT
UNDERPAD 30X30 INCONTINENT (UNDERPADS AND DIAPERS) ×2 IMPLANT
WATER STERILE IRR 1000ML POUR (IV SOLUTION) ×2 IMPLANT

## 2012-12-29 NOTE — Telephone Encounter (Addendum)
Message copied by Fredrich Birks on Wed Dec 29, 2012  3:03 PM ------      Message from: [place], [place] K      Created: Wed Dec 29, 2012  1:33 PM      Regarding: schedule                   ----- Message -----         From: Dara Lords, PA-C         Sent: 12/29/2012   1:15 PM           To: Sharee Pimple, CMA            S/p left brachiocephalic AVF 12/29/12.  F/u with Dr. Darrick Penna in 4-6 weeks.            Thanks,      Samantha ------  12/29/12: unable to reach patient, mailed letter, dpm

## 2012-12-29 NOTE — Preoperative (Signed)
Beta Blockers   Reason not to administer Beta Blockers:Not Applicable 

## 2012-12-29 NOTE — Anesthesia Postprocedure Evaluation (Signed)
Anesthesia Post Note  Patient: Anita [name]  Procedure(s) Performed: Procedure(s) (LRB): ARTERIOVENOUS (AV) FISTULA CREATION (Left)  Anesthesia type: MAC  Patient location: PACU  Post pain: Pain level controlled  Post assessment: Patient's Cardiovascular Status Stable  Last Vitals:  Filed Vitals:   12/29/12 1445  BP: 171/73  Pulse: 57  Temp: 36.6 C  Resp: 20    Post vital signs: Reviewed and stable  Level of consciousness: sedated  Complications: No apparent anesthesia complications

## 2012-12-29 NOTE — H&P (View-Only) (Signed)
[facility] [place] HISTORY AND PHYSICAL  Referring: Webb M.D. History of Present Illness:  Patient is a 61 y.o. year old female who presents for placement of a permanent hemodialysis access. The patient is right handed .  The patient is not currently on hemodialysis.  The cause of renal failure is thought to be secondary to diabetes.  Other chronic medical problems include peripheral neuropathy, hypertension, hyperlipidemia COPD, ll of which are currently stable. The patient has been experiencing some fatigue recently. She has also had some slight decrease of appetite. She denies any skin itching. She is not currently on hemodialysis  Past Medical History  Diagnosis Date  . Peripheral neuropathy   . Heart murmur   . ESRD (end stage renal disease)     Chronic Renal Insufficiency  . Diabetic nephropathy   . Diabetes mellitus without complication   . Proteinuria     Progression  . Hypertension   . Anemia   . Dyslipidemia   . Laryngeal mass   . COPD (chronic obstructive pulmonary disease)   . CHF (congestive heart failure)     Past Surgical History  Procedure Laterality Date  . Cholecystectomy    . Appendectomy    . Shoulder arthroscopy Right     w/ repair of rotator cuff repair  . Elbow tendon surgery Right   . Achilles tendon repair Right   . Cesarean section      X 2      Social History History  Substance Use Topics  . Smoking status: Current Every Day Smoker -- 1.00 packs/day for 40 years    Types: Cigarettes  . Smokeless tobacco: Never Used  . Alcohol Use: No    Family History Family History  Problem Relation Age of Onset  . COPD Mother   . Multiple sclerosis Father   . Depression Brother     Suicide  . Heart disease Brother     Allergies  Allergies  Allergen Reactions  . Doxycycline   . Lipitor (Atorvastatin)      Current Outpatient Prescriptions  Medication Sig Dispense Refill  . atenolol (TENORMIN) 50 MG tablet Take 50 mg  by mouth daily.      . calcitRIOL (ROCALTROL) 0.25 MCG capsule Take 0.25 mcg by mouth daily.      . Cinnamon ([employer] CINNAMON) 500 MG capsule Take 500 mg by mouth daily.      . cloNIDine (CATAPRES) 0.2 MG tablet Take 0.2 mg by mouth 2 (two) times daily.      . Cyanocobalamin (VITAMIN B 12 PO) Take 1,000 mg by mouth daily.      . folic acid (FOLVITE) 800 MCG tablet Take 400 mcg by mouth daily.      . hydrALAZINE (APRESOLINE) 25 MG tablet Take 25 mg by mouth 2 (two) times daily.       . HYDROcodone-acetaminophen (LORTAB) 10-500 MG per tablet Take 1 tablet by mouth every 6 (six) hours as needed for pain.      . lisinopril (PRINIVIL,ZESTRIL) 20 MG tablet Take 20 mg by mouth daily.      . LORazepam (ATIVAN) 1 MG tablet Take 1 mg by mouth every 8 (eight) hours.      . omeprazole (PRILOSEC) 20 MG capsule Take 20 mg by mouth daily.      . paricalcitol (ZEMPLAR) 1 MCG capsule Take 1 mcg by mouth daily.      . saxagliptin HCl (ONGLYZA) 5 MG TABS tablet Take by mouth daily.      .   sodium bicarbonate 650 MG tablet Take 650 mg by mouth 4 (four) times daily.      . Vitamin D, Ergocalciferol, (DRISDOL) 50000 UNITS CAPS Take 50,000 Units by mouth.      . zolpidem (AMBIEN) 10 MG tablet Take 10 mg by mouth at bedtime as needed for sleep.       No current facility-administered medications for this visit.    ROS:   General:  No weight loss, Fever, chills  HEENT: No recent headaches, no nasal bleeding, no visual changes, no sore throat  Neurologic: No dizziness, blackouts, seizures. No recent symptoms of stroke or mini- stroke. No recent episodes of slurred speech, or temporary blindness.  Cardiac: No recent episodes of chest pain/pressure, no shortness of breath at rest.  + shortness of breath with exertion.  Denies history of atrial fibrillation or irregular heartbeat  Vascular: No history of rest pain in feet.  No history of claudication.  No history of non-healing ulcer, No history of DVT   Pulmonary: No  home oxygen, no productive cough, no hemoptysis,  No asthma or wheezing  Musculoskeletal:  [ ] Arthritis, [ ] Low back pain,  [ ] Joint pain  Hematologic:No history of hypercoagulable state.  No history of easy bleeding.  No history of anemia  Gastrointestinal: No hematochezia or melena,  No gastroesophageal reflux, no trouble swallowing  Urinary: [x ] chronic Kidney disease, [ ] on HD - [ ] MWF or [ ] TTHS, [ ] Burning with urination, [ ] Frequent urination, [ ] Difficulty urinating;   Skin: No rashes  Psychological: No history of anxiety,  No history of depression   Physical Examination  Filed Vitals:   12/23/12 1256  BP: 188/60  Pulse: 59  Resp: 16  Height: 5' 6.5" (1.689 m)  Weight: 243 lb (110.224 kg)  SpO2: 99%    Body mass index is 38.64 kg/(m^2).  General:  Alert and oriented, no acute distress HEENT: Normal Neck: No bruit or JVD Pulmonary: Clear to auscultation bilaterally Cardiac: Regular Rate and Rhythm with 3/6 systolic murmur heard throughout the precordium Gastrointestinal: Soft, non-tender, non-distended, no mass, obese Skin: No rash Extremity Pulses:  2+ radial, brachial pulses bilaterally Musculoskeletal: No deformity trace ankle edema  Neurologic: Upper and lower extremity motor 5/5 and symmetric  DATA: Patient had bilateral vein mapping ultrasound today. I reviewed and interpreted this study. The cephalic vein in the forearm is fairly small. It is of good quality and the upper arm bilaterally greater than 4 mm in diameter. The basilic vein in the right is also greater than 4 mm the basilic vein in the left is fairly small  Recent echocardiogram results were reviewed which shows aortic stenosis with mild regurgitation normal ejection fraction some diastolic dysfunction   ASSESSMENT: Needs hemodialysis access.   PLAN:  Left brachiocephalic AV fistula Wednesday, 12/29/2012.  Risks benefits possible complications and procedure details the fistula  placement including but not limited to non-maturation ischemic steal bleeding and infection were explained to the patient today.  She understands and agrees to proceed.  Caisley Baxendale, MD [facility] [place] Office: [phone] Pager: [phone]  

## 2012-12-29 NOTE — Transfer of Care (Signed)
Immediate Anesthesia Transfer of Care Note  Patient: Anita [name]  Procedure(s) Performed: Procedure(s) with comments: ARTERIOVENOUS (AV) FISTULA CREATION (Left) - Creation Left Brachial Cephalic Arteriovenous Fistula  Patient Location: PACU  Anesthesia Type:MAC  Level of Consciousness: awake, alert  and oriented  Airway & Oxygen Therapy: Patient Spontanous Breathing and Patient connected to nasal cannula oxygen  Post-op Assessment: Report given to PACU RN, Post -op Vital signs reviewed and stable and Patient moving all extremities X 4  Post vital signs: Reviewed and stable  Complications: No apparent anesthesia complications

## 2012-12-29 NOTE — Op Note (Signed)
Procedure: Left Brachial Cephalic AV fistula  Preop: ESRD  Postop: ESRD  Anesthesia: Local with IV sedation  Assistant: Doreatha Massed, PA-C  Findings: 3.5 mm cephalic vein  Procedure: After obtaining informed consent, the patient was taken to the operating room.  After adequate sedation, the left upper extremity was prepped and draped in usual sterile fashion.  Local anesthesia was infiltrated near the antecubital crease.  A transverse incision was then made near the antecubital crease the left arm. The incision was carried into the subcutaneous tissues down to level of the cephalic vein. The cephalic vein was approximately 3.5 mm in diameter. It was of good quality. This was dissected free circumferentially and small side branches ligated and divided between silk ties or clips. Next the brachial artery was dissected free in the medial portion of the incision. The artery was  3 mm in diameter. The vessel loops were placed proximal and distal to the planned site of arteriotomy. The patient was given 5000 units of intravenous heparin. After appropriate circulation time, the vessel loops were used to control the artery. A longitudinal opening was made in the brachial artery.  The vein was ligated distally with a 2-0 silk tie. The vein was controlled proximally with a fine bulldog clamp. The vein was then swung over to the artery and sewn end of vein to side of artery using a running 7-0 Prolene suture. Just prior to completion of the anastomosis, everything was fore bled back bled and thoroughly flushed. The anastomosis was secured, vessel loops released, and there was a palpable thrill in the fistula immediately. After hemostasis was obtained, the subcutaneous tissues were reapproximated using a running 3-0 Vicryl suture. The skin was then closed with a 4 0 Vicryl subcuticular stitch. Dermabond was applied to the skin incision.  The patient had an audible radial doppler signal that augmented  approximately 40% with compression of the fistula.  Instrument sponge and needle count was correct at the end of the case.  The patient was taken to PACU in stable condition.  Fabienne Bruns, MD [facility] [place] Office: [phone] Pager: [phone]

## 2012-12-29 NOTE — Interval H&P Note (Signed)
History and Physical Interval Note:  12/29/2012 11:36 AM  Anita [name]  has presented today for surgery, with the diagnosis of End Stage Renal Disease  The various methods of treatment have been discussed with the patient and family. After consideration of risks, benefits and other options for treatment, the patient has consented to  Procedure(s) with comments: ARTERIOVENOUS (AV) FISTULA CREATION (Left) - Creation Left Brachial Cephalic Arteriovenous Fistula as a surgical intervention .  The patient's history has been reviewed, patient examined, no change in status, stable for surgery.  I have reviewed the patient's chart and labs.  Questions were answered to the patient's satisfaction.     FIELDS,CHARLES E

## 2012-12-29 NOTE — Anesthesia Preprocedure Evaluation (Addendum)
Anesthesia Evaluation  Patient identified by MRN, date of birth, ID band Patient awake    Reviewed: Allergy & Precautions, H&P , NPO status , Patient's Chart, lab work & pertinent test results  History of Anesthesia Complications (+) PONV  Airway Mallampati: II TM Distance: >3 FB Neck ROM: Full    Dental  (+) Poor Dentition and Dental Advisory Given   Pulmonary COPD COPD inhaler, Current Smoker,    Pulmonary exam normal       Cardiovascular hypertension, Pt. on medications +CHF     Neuro/Psych negative neurological ROS     GI/Hepatic negative GI ROS, Neg liver ROS,   Endo/Other  diabetes, Insulin Dependent  Renal/GU CRFRenal disease     Musculoskeletal   Abdominal   Peds  Hematology negative hematology ROS (+)   Anesthesia Other Findings   Reproductive/Obstetrics                          Anesthesia Physical Anesthesia Plan  ASA: III  Anesthesia Plan: MAC   Post-op Pain Management:    Induction: Intravenous  Airway Management Planned: Simple Face Mask  Additional Equipment:   Intra-op Plan:   Post-operative Plan:   Informed Consent: I have reviewed the patients History and Physical, chart, labs and discussed the procedure including the risks, benefits and alternatives for the proposed anesthesia with the patient or authorized representative who has indicated his/her understanding and acceptance.   Dental advisory given  Plan Discussed with: CRNA, Anesthesiologist and Surgeon  Anesthesia Plan Comments:        Anesthesia Quick Evaluation

## 2012-12-31 ENCOUNTER — Encounter (HOSPITAL_COMMUNITY): Payer: Self-pay | Admitting: Vascular Surgery

## 2013-02-02 ENCOUNTER — Encounter: Payer: Self-pay | Admitting: Vascular Surgery

## 2013-02-03 ENCOUNTER — Encounter (INDEPENDENT_AMBULATORY_CARE_PROVIDER_SITE_OTHER): Payer: Medicare Other | Admitting: Vascular Surgery

## 2013-02-03 ENCOUNTER — Encounter: Payer: Self-pay | Admitting: Vascular Surgery

## 2013-02-03 ENCOUNTER — Encounter: Payer: Self-pay | Admitting: *Deleted

## 2013-02-03 ENCOUNTER — Other Ambulatory Visit: Payer: Self-pay | Admitting: *Deleted

## 2013-02-03 ENCOUNTER — Ambulatory Visit (INDEPENDENT_AMBULATORY_CARE_PROVIDER_SITE_OTHER): Payer: Medicare Other | Admitting: Vascular Surgery

## 2013-02-03 VITALS — BP 182/69 | HR 55 | Temp 97.0°F | Resp 16 | Ht 66.5 in | Wt 251.0 lb

## 2013-02-03 DIAGNOSIS — M7989 Other specified soft tissue disorders: Secondary | ICD-10-CM

## 2013-02-03 DIAGNOSIS — N186 End stage renal disease: Secondary | ICD-10-CM

## 2013-02-03 DIAGNOSIS — R1900 Intra-abdominal and pelvic swelling, mass and lump, unspecified site: Secondary | ICD-10-CM | POA: Insufficient documentation

## 2013-02-03 DIAGNOSIS — Z48812 Encounter for surgical aftercare following surgery on the circulatory system: Secondary | ICD-10-CM

## 2013-02-03 DIAGNOSIS — Z4931 Encounter for adequacy testing for hemodialysis: Secondary | ICD-10-CM

## 2013-02-03 DIAGNOSIS — N184 Chronic kidney disease, stage 4 (severe): Secondary | ICD-10-CM

## 2013-02-03 NOTE — Progress Notes (Signed)
Patient is a 62 year old female who returns for followup today after placement of a left brachiocephalic AV fistula May 14. She is currently not on hemodialysis. She denies any numbness or tingling in her left hand.  Physical exam:  Filed Vitals:   02/03/13 1441  BP: 182/69  Pulse: 55  Temp: 97 F (36.1 C)  TempSrc: Oral  Resp: 16  Height: 5' 6.5" (1.689 m)  Weight: 251 lb (113.853 kg)  SpO2: 96%   Left upper extremity: Palpable thrill in fistula it is deep and difficult to palpate Left hand pink warm well perfused 1+ radial pulse  Data: Duplex ultrasound of the fistula was performed today. The fistula 7-8 mm in diameter throughout its course. It is possible narrowing of the proximal anastomosis with velocity of 93 cm/s there was one large side branch mid fistula. The fistula 7-8 mm in depth throughout its course  Assessment: Maturing left upper arm AV fistula but with possible narrowing of the anastomosis as well as overall being too deep Plan: Superficialization and possible revision of the anastomosis on 02/15/2013. Risks benefits possible complications procedure details were explained the patient today. She understands and agrees to proceed.  Fabienne Bruns, MD [facility] [place] Office: [phone] Pager: [phone]

## 2013-02-07 ENCOUNTER — Encounter (HOSPITAL_COMMUNITY): Payer: Self-pay | Admitting: Pharmacy Technician

## 2013-02-14 ENCOUNTER — Encounter (HOSPITAL_COMMUNITY): Payer: Self-pay | Admitting: *Deleted

## 2013-02-14 MED ORDER — SODIUM CHLORIDE 0.9 % IV SOLN
INTRAVENOUS | Status: DC
  Administered 2013-02-15: 10 mL/h via INTRAVENOUS

## 2013-02-14 MED ORDER — DEXTROSE 5 % IV SOLN
1.5000 g | INTRAVENOUS | Status: AC
  Administered 2013-02-15: 1.5 g via INTRAVENOUS
  Filled 2013-02-14: qty 1.5

## 2013-02-15 ENCOUNTER — Encounter (HOSPITAL_COMMUNITY): Payer: Self-pay | Admitting: Anesthesiology

## 2013-02-15 ENCOUNTER — Telehealth: Payer: Self-pay | Admitting: Vascular Surgery

## 2013-02-15 ENCOUNTER — Ambulatory Visit (HOSPITAL_COMMUNITY)
Admission: RE | Admit: 2013-02-15 | Discharge: 2013-02-15 | Disposition: A | Discharge status: Home / Self Care | Payer: Medicare Other | Source: Ambulatory Visit | Attending: Vascular Surgery | Admitting: Vascular Surgery

## 2013-02-15 ENCOUNTER — Ambulatory Visit (HOSPITAL_COMMUNITY): Payer: Medicare Other | Admitting: Anesthesiology

## 2013-02-15 ENCOUNTER — Encounter (HOSPITAL_COMMUNITY): Payer: Self-pay | Admitting: Certified Registered Nurse Anesthetist

## 2013-02-15 ENCOUNTER — Encounter (HOSPITAL_COMMUNITY)
Admission: RE | Disposition: A | Discharge status: Home / Self Care | Payer: Self-pay | Source: Ambulatory Visit | Attending: Vascular Surgery

## 2013-02-15 DIAGNOSIS — T82898A Other specified complication of vascular prosthetic devices, implants and grafts, initial encounter: Secondary | ICD-10-CM

## 2013-02-15 DIAGNOSIS — T82598A Other mechanical complication of other cardiac and vascular devices and implants, initial encounter: Secondary | ICD-10-CM | POA: Insufficient documentation

## 2013-02-15 DIAGNOSIS — N186 End stage renal disease: Secondary | ICD-10-CM | POA: Insufficient documentation

## 2013-02-15 DIAGNOSIS — Y832 Surgical operation with anastomosis, bypass or graft as the cause of abnormal reaction of the patient, or of later complication, without mention of misadventure at the time of the procedure: Secondary | ICD-10-CM | POA: Insufficient documentation

## 2013-02-15 HISTORY — DX: Gastro-esophageal reflux disease without esophagitis: K21.9

## 2013-02-15 HISTORY — DX: Sleep apnea, unspecified: G47.30

## 2013-02-15 LAB — POCT I-STAT 4, (NA,K, GLUC, HGB,HCT): Sodium: 140 mEq/L (ref 135–145)

## 2013-02-15 SURGERY — FISTULA SUPERFICIALIZATION
Anesthesia: General | Site: Arm Upper | Laterality: Left | Wound class: Clean

## 2013-02-15 MED ORDER — ONDANSETRON HCL 4 MG/2ML IJ SOLN: 4.0000 mg | Freq: Four times a day (QID) | INTRAMUSCULAR | Status: DC | PRN

## 2013-02-15 MED ORDER — PROPOFOL 10 MG/ML IV BOLUS
INTRAVENOUS | Status: DC | PRN
  Administered 2013-02-15: 120 mg via INTRAVENOUS

## 2013-02-15 MED ORDER — LIDOCAINE HCL (CARDIAC) 10 MG/ML IV SOLN
INTRAVENOUS | Status: DC | PRN
  Administered 2013-02-15: 40 mg via INTRAVENOUS

## 2013-02-15 MED ORDER — OXYCODONE HCL 5 MG PO TABS
ORAL_TABLET | ORAL | Status: AC
  Filled 2013-02-15: qty 1

## 2013-02-15 MED ORDER — HYDROCODONE-ACETAMINOPHEN 7.5-325 MG/15ML PO SOLN
ORAL | Status: AC
  Filled 2013-02-15: qty 15

## 2013-02-15 MED ORDER — OXYCODONE HCL 5 MG PO TABS
5.0000 mg | ORAL_TABLET | Freq: Once | ORAL | Status: AC | PRN
  Administered 2013-02-15: 5 mg via ORAL

## 2013-02-15 MED ORDER — SODIUM CHLORIDE 0.9 % IR SOLN
Status: DC | PRN
  Administered 2013-02-15: 16:00:00

## 2013-02-15 MED ORDER — HYDROCODONE-ACETAMINOPHEN 7.5-325 MG PO TABS: 1.0000 | ORAL_TABLET | Freq: Four times a day (QID) | ORAL | Status: AC | PRN

## 2013-02-15 MED ORDER — 0.9 % SODIUM CHLORIDE (POUR BTL) OPTIME
TOPICAL | Status: DC | PRN
  Administered 2013-02-15: 1000 mL

## 2013-02-15 MED ORDER — HYDROCODONE-ACETAMINOPHEN 7.5-325 MG PO TABS
1.0000 | ORAL_TABLET | Freq: Four times a day (QID) | ORAL | Status: DC | PRN
  Administered 2013-02-15: 1 via ORAL

## 2013-02-15 MED ORDER — FENTANYL CITRATE 0.05 MG/ML IJ SOLN
25.0000 ug | INTRAMUSCULAR | Status: DC | PRN
  Administered 2013-02-15 (×4): 25 ug via INTRAVENOUS

## 2013-02-15 MED ORDER — OXYCODONE HCL 5 MG/5ML PO SOLN: 5.0000 mg | Freq: Once | ORAL | Status: AC | PRN

## 2013-02-15 MED ORDER — FENTANYL CITRATE 0.05 MG/ML IJ SOLN
INTRAMUSCULAR | Status: AC
  Filled 2013-02-15: qty 2

## 2013-02-15 MED ORDER — ONDANSETRON HCL 4 MG/2ML IJ SOLN
INTRAMUSCULAR | Status: DC | PRN
  Administered 2013-02-15: 4 mg via INTRAVENOUS

## 2013-02-15 MED ORDER — THROMBIN 20000 UNITS EX SOLR
CUTANEOUS | Status: AC
  Filled 2013-02-15: qty 20000

## 2013-02-15 MED ORDER — SODIUM CHLORIDE 0.9 % IV SOLN
INTRAVENOUS | Status: DC | PRN
  Administered 2013-02-15: 15:00:00 via INTRAVENOUS

## 2013-02-15 MED ORDER — DIPHENHYDRAMINE HCL 50 MG/ML IJ SOLN
INTRAMUSCULAR | Status: DC | PRN
  Administered 2013-02-15: 12.5 mg via INTRAVENOUS

## 2013-02-15 MED ORDER — FENTANYL CITRATE 0.05 MG/ML IJ SOLN
INTRAMUSCULAR | Status: DC | PRN
  Administered 2013-02-15 (×3): 50 ug via INTRAVENOUS

## 2013-02-15 MED ORDER — MIDAZOLAM HCL 5 MG/5ML IJ SOLN
INTRAMUSCULAR | Status: DC | PRN
  Administered 2013-02-15: 2 mg via INTRAVENOUS

## 2013-02-15 SURGICAL SUPPLY — 39 items
ADH SKN CLS APL DERMABOND .7 (GAUZE/BANDAGES/DRESSINGS) ×2
CANISTER SUCTION 2500CC (MISCELLANEOUS) ×2 IMPLANT
CLIP TI MEDIUM 6 (CLIP) ×2 IMPLANT
CLIP TI WIDE RED SMALL 6 (CLIP) ×2 IMPLANT
CLOTH BEACON ORANGE TIMEOUT ST (SAFETY) ×2 IMPLANT
COVER PROBE W GEL 5X96 (DRAPES) ×2 IMPLANT
COVER SURGICAL LIGHT HANDLE (MISCELLANEOUS) ×2 IMPLANT
DECANTER SPIKE VIAL GLASS SM (MISCELLANEOUS) ×2 IMPLANT
DERMABOND ADVANCED (GAUZE/BANDAGES/DRESSINGS) ×2
DERMABOND ADVANCED .7 DNX12 (GAUZE/BANDAGES/DRESSINGS) ×1 IMPLANT
DRAIN PENROSE 1/4X12 LTX STRL (WOUND CARE) ×2 IMPLANT
ELECT REM PT RETURN 9FT ADLT (ELECTROSURGICAL) ×2
ELECTRODE REM PT RTRN 9FT ADLT (ELECTROSURGICAL) ×1 IMPLANT
GEL ULTRASOUND 20GR AQUASONIC (MISCELLANEOUS) IMPLANT
GLOVE BIO SURGEON STRL SZ 6.5 (GLOVE) ×2 IMPLANT
GLOVE BIO SURGEON STRL SZ7 (GLOVE) ×3 IMPLANT
GLOVE BIO SURGEON STRL SZ7.5 (GLOVE) ×2 IMPLANT
GLOVE BIOGEL PI IND STRL 6.5 (GLOVE) IMPLANT
GLOVE BIOGEL PI IND STRL 7.0 (GLOVE) IMPLANT
GLOVE BIOGEL PI INDICATOR 6.5 (GLOVE) ×1
GLOVE BIOGEL PI INDICATOR 7.0 (GLOVE) ×1
GOWN PREVENTION PLUS XLARGE (GOWN DISPOSABLE) ×2 IMPLANT
GOWN STRL NON-REIN LRG LVL3 (GOWN DISPOSABLE) ×4 IMPLANT
GOWN STRL REIN XL XLG (GOWN DISPOSABLE) ×2 IMPLANT
KIT BASIN OR (CUSTOM PROCEDURE TRAY) ×2 IMPLANT
KIT ROOM TURNOVER OR (KITS) ×2 IMPLANT
LOOP VESSEL MINI RED (MISCELLANEOUS) IMPLANT
NS IRRIG 1000ML POUR BTL (IV SOLUTION) ×2 IMPLANT
PACK CV ACCESS (CUSTOM PROCEDURE TRAY) ×2 IMPLANT
PAD ARMBOARD 7.5X6 YLW CONV (MISCELLANEOUS) ×4 IMPLANT
SPONGE SURGIFOAM ABS GEL 100 (HEMOSTASIS) IMPLANT
SUT PROLENE 7 0 BV 1 (SUTURE) ×3 IMPLANT
SUT VIC AB 3-0 SH 27 (SUTURE) ×2
SUT VIC AB 3-0 SH 27X BRD (SUTURE) ×1 IMPLANT
SUT VICRYL 4-0 PS2 18IN ABS (SUTURE) ×4 IMPLANT
TOWEL OR 17X24 6PK STRL BLUE (TOWEL DISPOSABLE) ×2 IMPLANT
TOWEL OR 17X26 10 PK STRL BLUE (TOWEL DISPOSABLE) ×2 IMPLANT
UNDERPAD 30X30 INCONTINENT (UNDERPADS AND DIAPERS) ×2 IMPLANT
WATER STERILE IRR 1000ML POUR (IV SOLUTION) ×2 IMPLANT

## 2013-02-15 NOTE — Anesthesia Preprocedure Evaluation (Signed)
Anesthesia Evaluation  Patient identified by MRN, date of birth, ID band Patient awake    Reviewed: Allergy & Precautions, H&P , NPO status , Patient's Chart, lab work & pertinent test results  History of Anesthesia Complications (+) PONV  Airway Mallampati: II  Neck ROM: full    Dental   Pulmonary shortness of breath, sleep apnea , COPDCurrent Smoker,          Cardiovascular hypertension, +CHF     Neuro/Psych  Neuromuscular disease    GI/Hepatic GERD-  ,  Endo/Other  diabetes, Type obesity  Renal/GU ESRF and DialysisRenal disease     Musculoskeletal   Abdominal   Peds  Hematology   Anesthesia Other Findings   Reproductive/Obstetrics                           Anesthesia Physical Anesthesia Plan  ASA: III  Anesthesia Plan: General   Post-op Pain Management:    Induction: Intravenous  Airway Management Planned: LMA  Additional Equipment:   Intra-op Plan:   Post-operative Plan:   Informed Consent: I have reviewed the patients History and Physical, chart, labs and discussed the procedure including the risks, benefits and alternatives for the proposed anesthesia with the patient or authorized representative who has indicated his/her understanding and acceptance.     Plan Discussed with: CRNA, Anesthesiologist and Surgeon  Anesthesia Plan Comments:         Anesthesia Quick Evaluation

## 2013-02-15 NOTE — Anesthesia Procedure Notes (Signed)
Procedure Name: LMA Insertion Date/Time: 02/15/2013 3:27 PM Performed by: Orvilla Fus A Pre-anesthesia Checklist: Patient identified, Timeout performed, Emergency Drugs available, Suction available and Patient being monitored Patient Re-evaluated:Patient Re-evaluated prior to inductionOxygen Delivery Method: Circle system utilized Preoxygenation: Pre-oxygenation with 100% oxygen Intubation Type: IV induction Ventilation: Mask ventilation without difficulty LMA: LMA with gastric port inserted LMA Size: 4.0 Number of attempts: 1 Placement Confirmation: breath sounds checked- equal and bilateral and positive ETCO2 Tube secured with: Tape Dental Injury: Teeth and Oropharynx as per pre-operative assessment

## 2013-02-15 NOTE — H&P (View-Only) (Signed)
Patient is a 62 year old female who returns for followup today after placement of a left brachiocephalic AV fistula May 14. She is currently not on hemodialysis. She denies any numbness or tingling in her left hand.  Physical exam:  Filed Vitals:   02/03/13 1441  BP: 182/69  Pulse: 55  Temp: 97 F (36.1 C)  TempSrc: Oral  Resp: 16  Height: 5' 6.5" (1.689 m)  Weight: 251 lb (113.853 kg)  SpO2: 96%   Left upper extremity: Palpable thrill in fistula it is deep and difficult to palpate Left hand pink warm well perfused 1+ radial pulse  Data: Duplex ultrasound of the fistula was performed today. The fistula 7-8 mm in diameter throughout its course. It is possible narrowing of the proximal anastomosis with velocity of 93 cm/s there was one large side branch mid fistula. The fistula 7-8 mm in depth throughout its course  Assessment: Maturing left upper arm AV fistula but with possible narrowing of the anastomosis as well as overall being too deep Plan: Superficialization and possible revision of the anastomosis on 02/15/2013. Risks benefits possible complications procedure details were explained the patient today. She understands and agrees to proceed.  Fabienne Bruns, MD [facility] [place] Office: [phone] Pager: [phone]

## 2013-02-15 NOTE — Telephone Encounter (Addendum)
Message copied by Rosalyn Charters on Tue Feb 15, 2013  5:19 PM ------      Message from: Marlowe Shores      Created: Tue Feb 15, 2013  4:19 PM       4 week F/U revision AVF - Fields  unable to reach patient by phone mailed appt. lettter to notify patient of fu appt. with dr. Darrick Penna on 03-17-13 at 10:45 am ------

## 2013-02-15 NOTE — Transfer of Care (Signed)
Immediate Anesthesia Transfer of Care Note  Patient: Anita [name]  Procedure(s) Performed: Procedure(s): FISTULA SUPERFICIALIZATION AND REVISION (Left)  Patient Location: PACU  Anesthesia Type:General  Level of Consciousness: awake, alert  and oriented  Airway & Oxygen Therapy: Patient Spontanous Breathing and Patient connected to face mask oxygen  Post-op Assessment: Report given to PACU RN, Post -op Vital signs reviewed and stable and Patient moving all extremities  Post vital signs: Reviewed and stable  Complications: No apparent anesthesia complications

## 2013-02-15 NOTE — Anesthesia Postprocedure Evaluation (Signed)
  Anesthesia Post-op Note  Patient: Anita [name]  Procedure(s) Performed: Procedure(s): FISTULA SUPERFICIALIZATION AND REVISION (Left)  Patient Location: PACU  Anesthesia Type:General  Level of Consciousness: awake, alert  and oriented  Airway and Oxygen Therapy: Patient Spontanous Breathing  Post-op Pain: none  Post-op Assessment: Post-op Vital signs reviewed, Patient's Cardiovascular Status Stable, Respiratory Function Stable, Patent Airway and No signs of Nausea or vomiting  Post-op Vital Signs: Reviewed and stable  Complications: No apparent anesthesia complications

## 2013-02-15 NOTE — Op Note (Signed)
Procedure: #1 Superficialization right brachiocephalic AV fistula #2 ligation of multiple side branches  Preoperative Diagnosis: Non-maturing left brachiocephalic AV fistula  Postoperative diagnosis: Same  Anesthesia: General  Asst: Doreatha Massed PAC  Operative details: After obtaining informed consent, the patient was taken to the operating room. The patient was placed in supine position on the operating room table. After induction of general anesthesia and placement of a laryngeal mask the patient's entire left upper extremity was prepped and draped in the usual sterile fashion. A transverse incision was made near the left antecubital crease. The incision was carried into the subcutaneous tissues down to level of the left arm AV fistula. The fistula had an excellent thrill and palpation of the fistula suggested no inflow narrowing. The cephalic vein was dissected free circumferentially.  I then proceeded to dissect out the entire cephalic vein in the upper arm through several skip incisions of the arm. There were several side branches all of which were 1-2 mm in diameter and these were all ligated and divided between silk ties. The vein was fully mobilized circumferentially so it could rise to the skin surface. Hemostasis was obtained. The arm incisions were closed with running 4-0 Vicryl subcuticular stitch.  Dermabond was applied to all incisions. Patient tolerated the procedure well and there were no complications. Instrument, sponge, and  needle counts were correct at the end of the case.   Fabienne Bruns, MD [facility] [place] Office: [phone] Pager: [phone]

## 2013-02-15 NOTE — Anesthesia Postprocedure Evaluation (Signed)
  Anesthesia Post-op Note  Patient: Anita [name]  Procedure(s) Performed: Procedure(s): FISTULA SUPERFICIALIZATION AND REVISION (Left)  Patient Location: PACU  Anesthesia Type:General  Level of Consciousness: awake, alert  and oriented  Airway and Oxygen Therapy: Patient Spontanous Breathing and Patient connected to nasal cannula oxygen  Post-op Pain: mild  Post-op Assessment: Post-op Vital signs reviewed, Patient's Cardiovascular Status Stable, Respiratory Function Stable, Patent Airway and Pain level controlled  Post-op Vital Signs: stable  Complications: No apparent anesthesia complications

## 2013-02-15 NOTE — Interval H&P Note (Signed)
History and Physical Interval Note:  02/15/2013 2:36 PM  Anita [name]  has presented today for surgery, with the diagnosis of ESRD  The various methods of treatment have been discussed with the patient and family. After consideration of risks, benefits and other options for treatment, the patient has consented to  Procedure(s): FISTULA SUPERFICIALIZATION AND REVISION (Left) as a surgical intervention .  The patient's history has been reviewed, patient examined, no change in status, stable for surgery.  I have reviewed the patient's chart and labs.  Questions were answered to the patient's satisfaction.     Anita [name]

## 2013-03-15 ENCOUNTER — Encounter: Payer: Self-pay | Admitting: Vascular Surgery

## 2013-03-16 ENCOUNTER — Ambulatory Visit (INDEPENDENT_AMBULATORY_CARE_PROVIDER_SITE_OTHER): Payer: Medicare Other | Admitting: Vascular Surgery

## 2013-03-16 ENCOUNTER — Encounter: Payer: Self-pay | Admitting: Vascular Surgery

## 2013-03-16 VITALS — BP 169/74 | HR 58 | Resp 16 | Ht 66.5 in | Wt 217.0 lb

## 2013-03-16 DIAGNOSIS — N186 End stage renal disease: Secondary | ICD-10-CM

## 2013-03-16 NOTE — Progress Notes (Signed)
[facility] [place]  Subjective  - patient is status post Superficialization of her left upper arm AV fistula. The patient states she is doing well with a little numbness in the hand that goes away with active movement.  She has lost 20 lbs. Due to anorexia. She is currently not on dialysis. She has followup scheduled with Dr. Hyman Hopes in August.   Objective 169/74 58   16   @IOBRIEF @  Incisions are well healed.  Min. Scab formation. No sign of infection Min. decreased sensation in the left hand compared to the right Palpable radial pulse and strong thrill in fistula.  Assessment/Planning: S/P spiritualization of AV fistula 02/15/2013 AV fistula is ready for use once Dr. Hyman Hopes deems it necessary F/U with Dr. Darrick Penna PRN    Anita [name], Anita [name] [facility] 03/16/2013 2:10 PM  Fistula easily palpable throughout the upper arm. Left hand warm well perfused. No obvious sensory or motor deficits today. Incisions continuing to heal.  Fistula should be ready for use in the next few weeks. She will followup on as-needed basis.  Fabienne Bruns, MD [facility] [place] Office: [phone] Pager: [phone]

## 2013-03-17 ENCOUNTER — Ambulatory Visit: Payer: Medicare Other | Admitting: Vascular Surgery

## 2013-03-21 DIAGNOSIS — R609 Edema, unspecified: Secondary | ICD-10-CM | POA: Insufficient documentation

## 2013-05-31 ENCOUNTER — Ambulatory Visit: Payer: Medicare Other | Admitting: Vascular Surgery

## 2013-06-01 ENCOUNTER — Emergency Department (HOSPITAL_COMMUNITY): Payer: Medicare Other

## 2013-06-01 ENCOUNTER — Emergency Department (HOSPITAL_COMMUNITY)
Admission: EM | Admit: 2013-06-01 | Discharge: 2013-06-01 | Disposition: A | Discharge status: Home / Self Care | Payer: Medicare Other | Attending: Emergency Medicine | Admitting: Emergency Medicine

## 2013-06-01 ENCOUNTER — Encounter (HOSPITAL_COMMUNITY): Payer: Self-pay | Admitting: Emergency Medicine

## 2013-06-01 DIAGNOSIS — I120 Hypertensive chronic kidney disease with stage 5 chronic kidney disease or end stage renal disease: Secondary | ICD-10-CM | POA: Insufficient documentation

## 2013-06-01 DIAGNOSIS — E1129 Type 2 diabetes mellitus with other diabetic kidney complication: Secondary | ICD-10-CM | POA: Insufficient documentation

## 2013-06-01 DIAGNOSIS — K6289 Other specified diseases of anus and rectum: Secondary | ICD-10-CM | POA: Insufficient documentation

## 2013-06-01 DIAGNOSIS — J4489 Other specified chronic obstructive pulmonary disease: Secondary | ICD-10-CM | POA: Insufficient documentation

## 2013-06-01 DIAGNOSIS — Z992 Dependence on renal dialysis: Secondary | ICD-10-CM | POA: Insufficient documentation

## 2013-06-01 DIAGNOSIS — Z9889 Other specified postprocedural states: Secondary | ICD-10-CM | POA: Insufficient documentation

## 2013-06-01 DIAGNOSIS — K219 Gastro-esophageal reflux disease without esophagitis: Secondary | ICD-10-CM | POA: Insufficient documentation

## 2013-06-01 DIAGNOSIS — Z9089 Acquired absence of other organs: Secondary | ICD-10-CM | POA: Insufficient documentation

## 2013-06-01 DIAGNOSIS — Z79899 Other long term (current) drug therapy: Secondary | ICD-10-CM | POA: Insufficient documentation

## 2013-06-01 DIAGNOSIS — R011 Cardiac murmur, unspecified: Secondary | ICD-10-CM | POA: Insufficient documentation

## 2013-06-01 DIAGNOSIS — N186 End stage renal disease: Secondary | ICD-10-CM | POA: Insufficient documentation

## 2013-06-01 DIAGNOSIS — R197 Diarrhea, unspecified: Secondary | ICD-10-CM | POA: Insufficient documentation

## 2013-06-01 DIAGNOSIS — J449 Chronic obstructive pulmonary disease, unspecified: Secondary | ICD-10-CM | POA: Insufficient documentation

## 2013-06-01 DIAGNOSIS — I509 Heart failure, unspecified: Secondary | ICD-10-CM | POA: Insufficient documentation

## 2013-06-01 DIAGNOSIS — D649 Anemia, unspecified: Secondary | ICD-10-CM | POA: Insufficient documentation

## 2013-06-01 DIAGNOSIS — Z794 Long term (current) use of insulin: Secondary | ICD-10-CM | POA: Insufficient documentation

## 2013-06-01 DIAGNOSIS — F17200 Nicotine dependence, unspecified, uncomplicated: Secondary | ICD-10-CM | POA: Insufficient documentation

## 2013-06-01 DIAGNOSIS — K5900 Constipation, unspecified: Secondary | ICD-10-CM | POA: Insufficient documentation

## 2013-06-01 DIAGNOSIS — E876 Hypokalemia: Secondary | ICD-10-CM | POA: Insufficient documentation

## 2013-06-01 DIAGNOSIS — N058 Unspecified nephritic syndrome with other morphologic changes: Secondary | ICD-10-CM | POA: Insufficient documentation

## 2013-06-01 DIAGNOSIS — Z8669 Personal history of other diseases of the nervous system and sense organs: Secondary | ICD-10-CM | POA: Insufficient documentation

## 2013-06-01 LAB — BASIC METABOLIC PANEL
BUN: 34 mg/dL — ABNORMAL HIGH (ref 6–23)
Calcium: 9 mg/dL (ref 8.4–10.5)
Creatinine, Ser: 3.64 mg/dL — ABNORMAL HIGH (ref 0.50–1.10)
GFR calc Af Amer: 14 mL/min — ABNORMAL LOW (ref >90)
GFR calc non Af Amer: 12 mL/min — ABNORMAL LOW (ref >90)
Glucose, Bld: 111 mg/dL — ABNORMAL HIGH (ref 70–99)
Potassium: 2.9 mEq/L — ABNORMAL LOW (ref 3.5–5.1)

## 2013-06-01 MED ORDER — FLEET ENEMA 7-19 GM/118ML RE ENEM
1.0000 | ENEMA | Freq: Once | RECTAL | Status: AC
  Administered 2013-06-01: 1 via RECTAL

## 2013-06-01 MED ORDER — POLYETHYLENE GLYCOL 3350 17 GM/SCOOP PO POWD: 17.0000 g | Freq: Every day | ORAL | Status: DC

## 2013-06-01 MED ORDER — POTASSIUM CHLORIDE CRYS ER 20 MEQ PO TBCR
40.0000 meq | EXTENDED_RELEASE_TABLET | Freq: Once | ORAL | Status: AC
  Administered 2013-06-01: 40 meq via ORAL
  Filled 2013-06-01: qty 2

## 2013-06-01 NOTE — ED Provider Notes (Signed)
Medical screening examination/treatment/procedure(s) were performed by non-physician practitioner and as supervising physician I was immediately available for consultation/collaboration.   Tava Peery L Jacoby Ritsema, MD 06/01/13 1117 

## 2013-06-01 NOTE — ED Notes (Signed)
Reports having large solid BM.

## 2013-06-01 NOTE — ED Notes (Signed)
Reports constipation with no bm x 4 days.  C/o pain and pressure in rectum with lower abd cramping.  Took laxative last night with no relief.  Denies n/v/d.

## 2013-06-01 NOTE — ED Provider Notes (Signed)
CSN: [id number]     Arrival date & time 06/01/13  0706 History   First MD Initiated Contact with Patient 06/01/13 [phone]     Chief Complaint  Patient presents with  . Constipation   (Consider location/radiation/quality/duration/timing/severity/associated sxs/prior Treatment) HPI Comments: Anita [name] is a 62 y.o. Female presenting with a 4 day history of constipation.  She had diarrhea last week for which she took 2 imodium tablets.  Since then she has developed increased pain and pressure in her rectum and lower pelvis.  For the past 2 days she has leaked stool anytime she stands,  But has been unable to relieve the rectal pressure.  She took dulcolax PO yesterday without relief.  She denies fevers, chills, nausea or vomiting.  She is a dialysis patient and did not attend yesterday as she was unable to stand without leakage of stool, has resorted to using depends to avoid accidents since yesterday.       The history is provided by the patient.    Past Medical History  Diagnosis Date  . Peripheral neuropathy   . Heart murmur   . ESRD (end stage renal disease)     Chronic Renal Insufficiency  . Diabetic nephropathy   . Proteinuria     Progression  . Hypertension   . Anemia   . Dyslipidemia   . Laryngeal mass   . COPD (chronic obstructive pulmonary disease)   . CHF (congestive heart failure)   . PONV (postoperative nausea and vomiting)   . Shortness of breath     with too much fluid  . GERD (gastroesophageal reflux disease)   . Sleep apnea     Does not use CPAP- due to weight loss  . Diabetes mellitus without complication     pt denies   Past Surgical History  Procedure Laterality Date  . Cholecystectomy    . Appendectomy    . Shoulder arthroscopy Right     w/ repair of rotator cuff repair  . Elbow tendon surgery Right   . Achilles tendon repair Right   . Cesarean section      X 2   . Hemorrhoid surgery      many years ago  . Av fistula placement Left 12/29/2012     Procedure: ARTERIOVENOUS (AV) FISTULA CREATION;  Surgeon: Sherren Kerns, MD;  Location: [facility];  Service: Vascular;  Laterality: Left;  Creation Left Brachial Cephalic Arteriovenous Fistula  . Cardiac catheterization    . Laryngectomy      mass removed- noncancerous  . Back surgery      lower back   Family History  Problem Relation Age of Onset  . COPD Mother   . Multiple sclerosis Father   . Depression Brother     Suicide  . Heart disease Brother    History  Substance Use Topics  . Smoking status: Current Every Day Smoker -- 1.00 packs/day for 40 years    Types: Cigarettes  . Smokeless tobacco: Never Used  . Alcohol Use: No   OB History   Grav Para Term Preterm Abortions TAB SAB Ect Mult Living                 Review of Systems  Constitutional: Negative for chills.  HENT: Negative for congestion and sore throat.   Eyes: Negative.   Respiratory: Negative for chest tightness and shortness of breath.   Cardiovascular: Negative for chest pain.  Gastrointestinal: Positive for rectal pain. Negative for blood in stool.  Genitourinary: Negative.   Musculoskeletal: Negative for arthralgias, joint swelling and neck pain.  Skin: Negative.  Negative for rash and wound.  Neurological: Negative for dizziness, weakness, light-headedness, numbness and headaches.  Psychiatric/Behavioral: Negative.     Allergies  Doxycycline and Lipitor  Home Medications   Current Outpatient Rx  Name  Route  Sig  Dispense  Refill  . atenolol (TENORMIN) 50 MG tablet   Oral   Take 50 mg by mouth 2 (two) times daily.          . calcitRIOL (ROCALTROL) 0.25 MCG capsule   Oral   Take 0.25 mcg by mouth every Monday, Wednesday, and Friday.          . calcium acetate (PHOSLO) 667 MG capsule   Oral   Take 667 mg by mouth 3 (three) times daily with meals.         Marland Kitchen Cinnamon ([facility] CINNAMON) 500 MG capsule   Oral   Take 500 mg by mouth daily.         . cloNIDine (CATAPRES) 0.2 MG tablet    Oral   Take 0.2 mg by mouth 2 (two) times daily.         . Cyanocobalamin (VITAMIN B 12 PO)   Oral   Take 1,000 mg by mouth daily.         . furosemide (LASIX) 40 MG tablet   Oral   Take 80 mg by mouth 2 (two) times daily.          . hydrALAZINE (APRESOLINE) 25 MG tablet   Oral   Take 25 mg by mouth 2 (two) times daily.          Marland Kitchen HYDROcodone-acetaminophen (NORCO) 7.5-325 MG per tablet   Oral   Take 1 tablet by mouth every 6 (six) hours as needed for pain.   30 tablet   0   . insulin lispro (HUMALOG) 100 UNIT/ML injection   Subcutaneous   Inject into the skin 2 (two) times daily as needed for high blood sugar (>150 call the MD.).          Marland Kitchen lisinopril (PRINIVIL,ZESTRIL) 20 MG tablet   Oral   Take 20 mg by mouth 2 (two) times daily.          Marland Kitchen LORazepam (ATIVAN) 1 MG tablet   Oral   Take 1 mg by mouth every 8 (eight) hours as needed for anxiety.          Marland Kitchen omeprazole (PRILOSEC) 20 MG capsule   Oral   Take 20 mg by mouth daily as needed (acid reflux).          . polyethylene glycol powder (GLYCOLAX/MIRALAX) powder   Oral   Take 17 g by mouth daily.   527 g   0   . sodium bicarbonate 650 MG tablet   Oral   Take 650 mg by mouth 2 (two) times daily.          . Vitamin D, Ergocalciferol, (DRISDOL) 50000 UNITS CAPS   Oral   Take 50,000 Units by mouth every 30 (thirty) days. Take on the 10th day of each month.         . zolpidem (AMBIEN) 10 MG tablet   Oral   Take 10 mg by mouth at bedtime.           BP 170/54  Pulse 66  Temp(Src) 97.7 F (36.5 C) (Oral)  Resp 20  Ht 5\' 6"  (1.676 m)  Wt  205 lb (92.987 kg)  BMI 33.1 kg/m2  SpO2 95% Physical Exam  Nursing note and vitals reviewed. Constitutional: She appears well-developed and well-nourished.  HENT:  Head: Normocephalic and atraumatic.  Eyes: Conjunctivae are normal.  Neck: Normal range of motion.  Cardiovascular: Normal rate, regular rhythm, normal heart sounds and intact distal  pulses.   Pulmonary/Chest: Effort normal and breath sounds normal. She has no wheezes.  Abdominal: Soft. Bowel sounds are normal. She exhibits no distension. There is no tenderness. There is no guarding.  Genitourinary: Guaiac negative stool.  Hard stool in rectum with copious overflow soft stool present on digital exam.  Musculoskeletal: Normal range of motion.  Neurological: She is alert.  Skin: Skin is warm and dry.  Psychiatric: She has a normal mood and affect.    ED Course  Procedures (including critical care time)  Pt was given fleets enema with large bm resulting.  Improved rectal pain and pressure,  Still feels "sore".    Labs Review Labs Reviewed  BASIC METABOLIC PANEL - Abnormal; Notable for the following:    Sodium 129 (*)    Potassium 2.9 (*)    Chloride 87 (*)    Glucose, Bld 111 (*)    BUN 34 (*)    Creatinine, Ser 3.64 (*)    GFR calc non Af Amer 12 (*)    GFR calc Af Amer 14 (*)    All other components within normal limits   Imaging Review Dg Abd 2 Views  06/01/2013   CLINICAL DATA:  Abdominal pain with cramping and constipation.  EXAM: ABDOMEN - 2 VIEW  COMPARISON:  Abdominal pelvic CT 03/21/2009. Chest radiographs 12/28/2012.  FINDINGS: The bowel gas pattern is normal. There is prominent stool in the rectum. No free intraperitoneal air is identified. There are scattered vascular calcifications. Cholecystectomy clips are noted. There is a new right pleural effusion which is loculated laterally at the costophrenic angle. Mild opacity is noted at the right lung base. There is no significant left pleural effusion.  IMPRESSION: 1. No acute abdominal findings identified. Prominent stool in the colon may indicate constipation. 2. New right pleural effusion loculated at the costophrenic angle. This is of undetermined etiology. Consider CT or thoracentesis for further evaluation.   Electronically Signed   By: Roxy Horseman M.D.   On: 06/01/2013 08:52    EKG Interpretation    None       MDM   1. Constipation   2. Overflow diarrhea   3. Hypokalemia       Pt discussed with Dr Estell Harpin.  Pt with constipation/impaction with overflow diarrhea.  She is a dialysis patient who missed yesterdays treatment.  Potassium is low at 2.9.  Will replace with PO 40 meq.  Fleets enema performed with adequate results.  miralax prescribed.  F/u with dialysis tomorrow as routinely scheduled.    Burgess Amor, PA-C 06/01/13 [phone]

## 2013-07-12 ENCOUNTER — Emergency Department (HOSPITAL_COMMUNITY): Payer: Medicare Other

## 2013-07-12 ENCOUNTER — Encounter (HOSPITAL_COMMUNITY): Payer: Self-pay | Admitting: Emergency Medicine

## 2013-07-12 ENCOUNTER — Inpatient Hospital Stay (HOSPITAL_COMMUNITY)
Admission: EM | Admit: 2013-07-12 | Discharge: 2013-07-18 | DRG: 308 | Disposition: A | Discharge status: Home / Self Care | Payer: Medicare Other | Attending: Internal Medicine | Admitting: Internal Medicine

## 2013-07-12 DIAGNOSIS — E1142 Type 2 diabetes mellitus with diabetic polyneuropathy: Secondary | ICD-10-CM | POA: Diagnosis present

## 2013-07-12 DIAGNOSIS — J438 Other emphysema: Secondary | ICD-10-CM | POA: Diagnosis present

## 2013-07-12 DIAGNOSIS — E1122 Type 2 diabetes mellitus with diabetic chronic kidney disease: Secondary | ICD-10-CM

## 2013-07-12 DIAGNOSIS — Z836 Family history of other diseases of the respiratory system: Secondary | ICD-10-CM

## 2013-07-12 DIAGNOSIS — R531 Weakness: Secondary | ICD-10-CM | POA: Diagnosis present

## 2013-07-12 DIAGNOSIS — R627 Adult failure to thrive: Secondary | ICD-10-CM | POA: Diagnosis present

## 2013-07-12 DIAGNOSIS — R0902 Hypoxemia: Secondary | ICD-10-CM

## 2013-07-12 DIAGNOSIS — E43 Unspecified severe protein-calorie malnutrition: Secondary | ICD-10-CM | POA: Diagnosis present

## 2013-07-12 DIAGNOSIS — E11319 Type 2 diabetes mellitus with unspecified diabetic retinopathy without macular edema: Secondary | ICD-10-CM | POA: Diagnosis present

## 2013-07-12 DIAGNOSIS — K5900 Constipation, unspecified: Secondary | ICD-10-CM | POA: Diagnosis present

## 2013-07-12 DIAGNOSIS — IMO0002 Reserved for concepts with insufficient information to code with codable children: Secondary | ICD-10-CM | POA: Diagnosis present

## 2013-07-12 DIAGNOSIS — W101XXA Fall (on)(from) sidewalk curb, initial encounter: Secondary | ICD-10-CM | POA: Diagnosis present

## 2013-07-12 DIAGNOSIS — J90 Pleural effusion, not elsewhere classified: Secondary | ICD-10-CM

## 2013-07-12 DIAGNOSIS — E041 Nontoxic single thyroid nodule: Secondary | ICD-10-CM | POA: Diagnosis present

## 2013-07-12 DIAGNOSIS — N058 Unspecified nephritic syndrome with other morphologic changes: Secondary | ICD-10-CM | POA: Diagnosis present

## 2013-07-12 DIAGNOSIS — I359 Nonrheumatic aortic valve disorder, unspecified: Secondary | ICD-10-CM | POA: Diagnosis present

## 2013-07-12 DIAGNOSIS — D696 Thrombocytopenia, unspecified: Secondary | ICD-10-CM | POA: Diagnosis present

## 2013-07-12 DIAGNOSIS — I350 Nonrheumatic aortic (valve) stenosis: Secondary | ICD-10-CM

## 2013-07-12 DIAGNOSIS — W108XXA Fall (on) (from) other stairs and steps, initial encounter: Secondary | ICD-10-CM | POA: Diagnosis present

## 2013-07-12 DIAGNOSIS — F17200 Nicotine dependence, unspecified, uncomplicated: Secondary | ICD-10-CM | POA: Diagnosis present

## 2013-07-12 DIAGNOSIS — Y92009 Unspecified place in unspecified non-institutional (private) residence as the place of occurrence of the external cause: Secondary | ICD-10-CM | POA: Diagnosis present

## 2013-07-12 DIAGNOSIS — D3500 Benign neoplasm of unspecified adrenal gland: Secondary | ICD-10-CM | POA: Diagnosis present

## 2013-07-12 DIAGNOSIS — K219 Gastro-esophageal reflux disease without esophagitis: Secondary | ICD-10-CM | POA: Diagnosis present

## 2013-07-12 DIAGNOSIS — E785 Hyperlipidemia, unspecified: Secondary | ICD-10-CM | POA: Diagnosis present

## 2013-07-12 DIAGNOSIS — D631 Anemia in chronic kidney disease: Secondary | ICD-10-CM | POA: Diagnosis present

## 2013-07-12 DIAGNOSIS — R0600 Dyspnea, unspecified: Secondary | ICD-10-CM | POA: Diagnosis present

## 2013-07-12 DIAGNOSIS — Z992 Dependence on renal dialysis: Secondary | ICD-10-CM

## 2013-07-12 DIAGNOSIS — N186 End stage renal disease: Secondary | ICD-10-CM | POA: Diagnosis present

## 2013-07-12 DIAGNOSIS — IMO0001 Reserved for inherently not codable concepts without codable children: Secondary | ICD-10-CM | POA: Diagnosis present

## 2013-07-12 DIAGNOSIS — S0990XA Unspecified injury of head, initial encounter: Secondary | ICD-10-CM

## 2013-07-12 DIAGNOSIS — E1139 Type 2 diabetes mellitus with other diabetic ophthalmic complication: Secondary | ICD-10-CM | POA: Diagnosis present

## 2013-07-12 DIAGNOSIS — I2510 Atherosclerotic heart disease of native coronary artery without angina pectoris: Secondary | ICD-10-CM | POA: Diagnosis present

## 2013-07-12 DIAGNOSIS — Z9089 Acquired absence of other organs: Secondary | ICD-10-CM

## 2013-07-12 DIAGNOSIS — I509 Heart failure, unspecified: Secondary | ICD-10-CM | POA: Diagnosis present

## 2013-07-12 DIAGNOSIS — I5033 Acute on chronic diastolic (congestive) heart failure: Secondary | ICD-10-CM | POA: Diagnosis present

## 2013-07-12 DIAGNOSIS — I700 Atherosclerosis of aorta: Secondary | ICD-10-CM | POA: Diagnosis present

## 2013-07-12 DIAGNOSIS — E1149 Type 2 diabetes mellitus with other diabetic neurological complication: Secondary | ICD-10-CM | POA: Diagnosis present

## 2013-07-12 DIAGNOSIS — I5032 Chronic diastolic (congestive) heart failure: Secondary | ICD-10-CM | POA: Diagnosis present

## 2013-07-12 DIAGNOSIS — Z8249 Family history of ischemic heart disease and other diseases of the circulatory system: Secondary | ICD-10-CM

## 2013-07-12 DIAGNOSIS — S0003XA Contusion of scalp, initial encounter: Secondary | ICD-10-CM | POA: Diagnosis present

## 2013-07-12 DIAGNOSIS — I4891 Unspecified atrial fibrillation: Principal | ICD-10-CM | POA: Diagnosis present

## 2013-07-12 DIAGNOSIS — I953 Hypotension of hemodialysis: Secondary | ICD-10-CM | POA: Diagnosis present

## 2013-07-12 DIAGNOSIS — G4733 Obstructive sleep apnea (adult) (pediatric): Secondary | ICD-10-CM | POA: Diagnosis present

## 2013-07-12 DIAGNOSIS — R1310 Dysphagia, unspecified: Secondary | ICD-10-CM | POA: Diagnosis present

## 2013-07-12 DIAGNOSIS — Z818 Family history of other mental and behavioral disorders: Secondary | ICD-10-CM

## 2013-07-12 DIAGNOSIS — E1129 Type 2 diabetes mellitus with other diabetic kidney complication: Secondary | ICD-10-CM | POA: Diagnosis present

## 2013-07-12 DIAGNOSIS — Z7901 Long term (current) use of anticoagulants: Secondary | ICD-10-CM

## 2013-07-12 HISTORY — DX: Dependence on renal dialysis: Z99.2

## 2013-07-12 HISTORY — DX: Unspecified atrial fibrillation: I48.91

## 2013-07-12 HISTORY — DX: Chronic diastolic (congestive) heart failure: I50.32

## 2013-07-12 HISTORY — DX: End stage renal disease: N18.6

## 2013-07-12 HISTORY — DX: Other specified diabetes mellitus with unspecified diabetic retinopathy without macular edema: E13.319

## 2013-07-12 HISTORY — DX: Nonrheumatic aortic (valve) stenosis: I35.0

## 2013-07-12 HISTORY — DX: Atherosclerosis of aorta: I70.0

## 2013-07-12 HISTORY — DX: Type 2 diabetes mellitus with diabetic chronic kidney disease: E11.22

## 2013-07-12 HISTORY — DX: Atherosclerotic heart disease of native coronary artery without angina pectoris: I25.10

## 2013-07-12 HISTORY — DX: Other specified diabetes mellitus with diabetic neuropathy, unspecified: E13.40

## 2013-07-12 LAB — CBC WITH DIFFERENTIAL/PLATELET
Basophils Absolute: 0 10*3/uL (ref 0.0–0.1)
HCT: 35.4 % — ABNORMAL LOW (ref 36.0–46.0)
Hemoglobin: 12.3 g/dL (ref 12.0–15.0)
Lymphocytes Relative: 25 % (ref 12–46)
Lymphs Abs: 1.8 10*3/uL (ref 0.7–4.0)
MCV: 84.5 fL (ref 78.0–100.0)
Neutro Abs: 5.1 10*3/uL (ref 1.7–7.7)
Neutrophils Relative %: 70 % (ref 43–77)
Platelets: 210 10*3/uL (ref 150–400)
RBC: 4.19 MIL/uL (ref 3.87–5.11)
RDW: 17.2 % — ABNORMAL HIGH (ref 11.5–15.5)
WBC: 7.3 10*3/uL (ref 4.0–10.5)

## 2013-07-12 LAB — TROPONIN I: Troponin I: <0.3 ng/mL (ref <0.30)

## 2013-07-12 LAB — COMPREHENSIVE METABOLIC PANEL
ALT: 23 U/L (ref 0–35)
AST: 17 U/L (ref 0–37)
CO2: 24 mEq/L (ref 19–32)
Chloride: 89 mEq/L — ABNORMAL LOW (ref 96–112)
GFR calc non Af Amer: 9 mL/min — ABNORMAL LOW (ref >90)
Glucose, Bld: 100 mg/dL — ABNORMAL HIGH (ref 70–99)
Sodium: 134 mEq/L — ABNORMAL LOW (ref 135–145)
Total Bilirubin: 0.3 mg/dL (ref 0.3–1.2)

## 2013-07-12 LAB — HEPATITIS B SURFACE ANTIGEN: Hepatitis B Surface Ag: NEGATIVE

## 2013-07-12 MED ORDER — ALTEPLASE 2 MG IJ SOLR
2.0000 mg | Freq: Once | INTRAMUSCULAR | Status: DC | PRN
  Filled 2013-07-12: qty 2

## 2013-07-12 MED ORDER — FUROSEMIDE 80 MG PO TABS
80.0000 mg | ORAL_TABLET | Freq: Two times a day (BID) | ORAL | Status: DC
  Administered 2013-07-12 – 2013-07-16 (×9): 80 mg via ORAL
  Filled 2013-07-12 (×13): qty 1

## 2013-07-12 MED ORDER — ALBUMIN HUMAN 25 % IV SOLN
25.0000 g | Freq: Once | INTRAVENOUS | Status: AC
  Administered 2013-07-12: 25 g via INTRAVENOUS

## 2013-07-12 MED ORDER — PANTOPRAZOLE SODIUM 40 MG PO TBEC
40.0000 mg | DELAYED_RELEASE_TABLET | Freq: Every day | ORAL | Status: DC
  Administered 2013-07-12 – 2013-07-18 (×7): 40 mg via ORAL
  Filled 2013-07-12 (×6): qty 1

## 2013-07-12 MED ORDER — PENTAFLUOROPROP-TETRAFLUOROETH EX AERO: 1.0000 "application " | INHALATION_SPRAY | CUTANEOUS | Status: DC | PRN

## 2013-07-12 MED ORDER — HYDROCODONE-ACETAMINOPHEN 5-325 MG PO TABS
ORAL_TABLET | ORAL | Status: AC
  Filled 2013-07-12: qty 1

## 2013-07-12 MED ORDER — HYDROCODONE-ACETAMINOPHEN 5-325 MG PO TABS
ORAL_TABLET | ORAL | Status: AC
  Administered 2013-07-12: 1
  Filled 2013-07-12: qty 1

## 2013-07-12 MED ORDER — NEPRO/CARBSTEADY PO LIQD
237.0000 mL | ORAL | Status: DC | PRN
  Filled 2013-07-12: qty 237

## 2013-07-12 MED ORDER — CLONIDINE HCL 0.2 MG PO TABS
0.2000 mg | ORAL_TABLET | Freq: Two times a day (BID) | ORAL | Status: DC
  Filled 2013-07-12 (×3): qty 1

## 2013-07-12 MED ORDER — HEPARIN BOLUS VIA INFUSION
2500.0000 [IU] | Freq: Once | INTRAVENOUS | Status: AC
  Administered 2013-07-12: 2500 [IU] via INTRAVENOUS
  Filled 2013-07-12: qty 2500

## 2013-07-12 MED ORDER — MORPHINE SULFATE 4 MG/ML IJ SOLN
4.0000 mg | Freq: Once | INTRAMUSCULAR | Status: AC
  Administered 2013-07-12: 4 mg via INTRAVENOUS
  Filled 2013-07-12: qty 1

## 2013-07-12 MED ORDER — SODIUM CHLORIDE 0.9 % IJ SOLN
3.0000 mL | Freq: Two times a day (BID) | INTRAMUSCULAR | Status: DC
Start: 2013-07-12 — End: 2013-07-18
  Administered 2013-07-12 – 2013-07-18 (×11): 3 mL via INTRAVENOUS

## 2013-07-12 MED ORDER — HYDROMORPHONE HCL PF 1 MG/ML IJ SOLN: 1.0000 mg | INTRAMUSCULAR | Status: DC | PRN

## 2013-07-12 MED ORDER — LIDOCAINE HCL (PF) 1 % IJ SOLN: 5.0000 mL | INTRAMUSCULAR | Status: DC | PRN

## 2013-07-12 MED ORDER — LIDOCAINE-PRILOCAINE 2.5-2.5 % EX CREA
1.0000 "application " | TOPICAL_CREAM | CUTANEOUS | Status: DC | PRN
  Filled 2013-07-12: qty 5

## 2013-07-12 MED ORDER — ONDANSETRON HCL 4 MG/2ML IJ SOLN
4.0000 mg | Freq: Once | INTRAMUSCULAR | Status: AC
  Administered 2013-07-12: 4 mg via INTRAVENOUS
  Filled 2013-07-12: qty 2

## 2013-07-12 MED ORDER — ALBUMIN HUMAN 25 % IV SOLN
INTRAVENOUS | Status: AC
  Administered 2013-07-12: 25 g via INTRAVENOUS
  Filled 2013-07-12: qty 100

## 2013-07-12 MED ORDER — HEPARIN SODIUM (PORCINE) 1000 UNIT/ML DIALYSIS: 1000.0000 [IU] | INTRAMUSCULAR | Status: DC | PRN

## 2013-07-12 MED ORDER — CALCITRIOL 0.25 MCG PO CAPS
0.2500 ug | ORAL_CAPSULE | ORAL | Status: DC
  Administered 2013-07-13 – 2013-07-18 (×3): 0.25 ug via ORAL
  Filled 2013-07-12 (×3): qty 1

## 2013-07-12 MED ORDER — SODIUM CHLORIDE 0.9 % IV SOLN: 100.0000 mL | INTRAVENOUS | Status: DC | PRN

## 2013-07-12 MED ORDER — HEPARIN SODIUM (PORCINE) 5000 UNIT/ML IJ SOLN
5000.0000 [IU] | Freq: Three times a day (TID) | INTRAMUSCULAR | Status: DC
  Filled 2013-07-12 (×2): qty 1

## 2013-07-12 MED ORDER — POLYETHYLENE GLYCOL 3350 17 GM/SCOOP PO POWD
17.0000 g | Freq: Every day | ORAL | Status: DC
  Administered 2013-07-12: 17 g via ORAL
  Filled 2013-07-12 (×2): qty 255

## 2013-07-12 MED ORDER — HYDROCODONE-ACETAMINOPHEN 7.5-325 MG PO TABS
1.0000 | ORAL_TABLET | Freq: Four times a day (QID) | ORAL | Status: DC | PRN
  Administered 2013-07-12 – 2013-07-13 (×2): 1 via ORAL
  Filled 2013-07-12 (×2): qty 1

## 2013-07-12 MED ORDER — DILTIAZEM HCL 25 MG/5ML IV SOLN
5.0000 mg | Freq: Once | INTRAVENOUS | Status: AC
  Administered 2013-07-12: 5 mg via INTRAVENOUS
  Filled 2013-07-12: qty 5

## 2013-07-12 MED ORDER — HEPARIN SODIUM (PORCINE) 1000 UNIT/ML DIALYSIS
1000.0000 [IU] | Freq: Once | INTRAMUSCULAR | Status: DC
  Filled 2013-07-12: qty 1

## 2013-07-12 MED ORDER — ZOLPIDEM TARTRATE 5 MG PO TABS
5.0000 mg | ORAL_TABLET | Freq: Every day | ORAL | Status: DC
  Administered 2013-07-12 – 2013-07-13 (×2): 5 mg via ORAL
  Filled 2013-07-12 (×2): qty 1

## 2013-07-12 MED ORDER — NEPRO/CARBSTEADY PO LIQD: 237.0000 mL | ORAL | Status: DC | PRN

## 2013-07-12 MED ORDER — HEPARIN SODIUM (PORCINE) 1000 UNIT/ML DIALYSIS: 20.0000 [IU]/kg | INTRAMUSCULAR | Status: DC | PRN

## 2013-07-12 MED ORDER — HEPARIN (PORCINE) IN NACL 100-0.45 UNIT/ML-% IJ SOLN
1200.0000 [IU]/h | INTRAMUSCULAR | Status: DC
  Administered 2013-07-12: 1200 [IU]/h via INTRAVENOUS
  Filled 2013-07-12 (×3): qty 250

## 2013-07-12 MED ORDER — ALBUTEROL SULFATE HFA 108 (90 BASE) MCG/ACT IN AERS: 2.0000 | INHALATION_SPRAY | Freq: Four times a day (QID) | RESPIRATORY_TRACT | Status: DC | PRN

## 2013-07-12 MED ORDER — SODIUM CHLORIDE 0.9 % IV BOLUS (SEPSIS)
500.0000 mL | Freq: Once | INTRAVENOUS | Status: AC
  Administered 2013-07-12: 500 mL via INTRAVENOUS

## 2013-07-12 MED ORDER — ONDANSETRON HCL 4 MG/2ML IJ SOLN: 4.0000 mg | Freq: Three times a day (TID) | INTRAMUSCULAR | Status: DC | PRN

## 2013-07-12 MED ORDER — CALCIUM ACETATE 667 MG PO CAPS
667.0000 mg | ORAL_CAPSULE | Freq: Three times a day (TID) | ORAL | Status: DC
  Administered 2013-07-12 – 2013-07-18 (×9): 667 mg via ORAL
  Filled 2013-07-12 (×20): qty 1

## 2013-07-12 MED ORDER — HEPARIN SODIUM (PORCINE) 1000 UNIT/ML DIALYSIS
1000.0000 [IU] | INTRAMUSCULAR | Status: DC | PRN
  Filled 2013-07-12: qty 1

## 2013-07-12 NOTE — Progress Notes (Signed)
Patient arrived by ems after experiencing fall, hitting head and ALOC. Patient's husband is in route from [place], [place].  Provided support to staff. Will follow as needed.  07/12/13 1000  Clinical Encounter Type  Visited With Patient not available;Health care provider  Visit Type ED;Trauma;Other (Comment) (staff support)  Referral From Nurse  Spiritual Encounters  Spiritual Needs Emotional  Stress Factors  Patient Stress Factors None identified  Fae Pippin, pager [phone]

## 2013-07-12 NOTE — Consult Note (Signed)
Anita [name] is a 62 yo female with a history of ESRD on dialysis at the [facility] and receives treatment on Tuesday, Thursday and Saturday.  She reports an inability of removal of fluid and noticeable accumulation.  In fact she was started on diuretics recently to assist with fluid removal without improvement.  Today she had a fall while walking, slipped in the driveway hitting the back of her head. No loss of consciousness. She complain of pain in the left side of her head and left arm. She is not taking any Coumadin. Denies any chest pain or shortness of breath. Denies abdominal pain nausea vomiting. No back pain, focal weakness, numbness or tingling.  Brain CT was negative except for sq hematoma and CXR showed a new right pleural effusion.  There was diffuse subQ, mesenteric and mediastinal edema. She is due HD today.  Past Medical History  Diagnosis Date  . Peripheral neuropathy   . Heart murmur   . ESRD (end stage renal disease)     Chronic Renal Insufficiency  . Diabetic nephropathy   . Proteinuria     Progression  . Hypertension   . Anemia   . Dyslipidemia   . Laryngeal mass   . COPD (chronic obstructive pulmonary disease)   . CHF (congestive heart failure)   . PONV (postoperative nausea and vomiting)   . Shortness of breath     with too much fluid  . GERD (gastroesophageal reflux disease)   . Sleep apnea     Does not use CPAP- due to weight loss  . Diabetes mellitus without complication     pt denies   Past Surgical History  Procedure Laterality Date  . Cholecystectomy    . Appendectomy    . Shoulder arthroscopy Right     w/ repair of rotator cuff repair  . Elbow tendon surgery Right   . Achilles tendon repair Right   . Cesarean section      X 2   . Hemorrhoid surgery      many years ago  . Av fistula placement Left 12/29/2012    Procedure: ARTERIOVENOUS (AV) FISTULA CREATION;  Surgeon: Sherren Kerns, MD;  Location: [facility] OR;  Service: Vascular;  Laterality:  Left;  Creation Left Brachial Cephalic Arteriovenous Fistula  . Cardiac catheterization    . Laryngectomy      mass removed- noncancerous  . Back surgery      lower back   Social History:  reports that she has been smoking Cigarettes.  She has a 40 pack-year smoking history. She has never used smokeless tobacco. She reports that she does not drink alcohol or use illicit drugs. Allergies:  Allergies  Allergen Reactions  . Doxycycline Nausea And Vomiting  . Lipitor [Atorvastatin] Nausea And Vomiting   Family History  Problem Relation Age of Onset  . COPD Mother   . Multiple sclerosis Father   . Depression Brother     Suicide  . Heart disease Brother     Medications:  Scheduled: . [START ON 07/13/2013] calcitRIOL  0.25 mcg Oral Q M,W,F  . calcium acetate  667 mg Oral TID WC  . cloNIDine  0.2 mg Oral BID  . furosemide  80 mg Oral BID  . heparin  1,000 Units Dialysis Once in dialysis  . heparin  5,000 Units Subcutaneous Q8H  . pantoprazole  40 mg Oral Daily  . polyethylene glycol powder  17 g Oral Daily  . sodium chloride  3 mL Intravenous  Q12H  . zolpidem  10 mg Oral QHS   ROS: as per HPI otherwise, genarlized maliase and fatigue  Blood pressure 125/56, pulse 98, temperature 97.7 F (36.5 [name]), temperature source Oral, resp. rate 16, height 5' 6.5" (1.689 m), weight 90.719 kg (200 lb), SpO2 100.00%.  General appearance: alert and cooperative Head: Normocephalic, without obvious abnormality, atraumatic Eyes: negative Ears: normal TM's and external ear canals both ears Nose: Nares normal. Septum midline. Mucosa normal. No drainage or sinus tenderness. Throat: lips, mucosa, and tongue normal; teeth and gums normal Resp: diminished breath sounds bibasilar Chest wall: no tenderness Cardio: regular rate and rhythm, S1, S2 normal, no murmur, click, rub or gallop GI: obese protuberant Extremities: edema 3+ bilat AV access lue w/ bruising Skin: Skin color, texture, turgor normal.  No rashes or lesions Neurologic: Grossly normal Results for orders placed during the hospital encounter of 07/12/13 (from the past 48 hour(s))  CBC WITH DIFFERENTIAL     Status: Abnormal   Collection Time    07/12/13 10:08 AM      Result Value Range   WBC 7.3  4.0 - 10.5 K/uL   RBC 4.19  3.87 - 5.11 MIL/uL   Hemoglobin 12.3  12.0 - 15.0 g/dL   HCT 16.1 (*) 09.6 - 04.5 %   MCV 84.5  78.0 - 100.0 fL   MCH 29.4  26.0 - 34.0 pg   MCHC 34.7  30.0 - 36.0 g/dL   RDW 40.9 (*) 81.1 - 91.4 %   Platelets 210  150 - 400 K/uL   Neutrophils Relative % 70  43 - 77 %   Neutro Abs 5.1  1.7 - 7.7 K/uL   Lymphocytes Relative 25  12 - 46 %   Lymphs Abs 1.8  0.7 - 4.0 K/uL   Monocytes Relative 5  3 - 12 %   Monocytes Absolute 0.4  0.1 - 1.0 K/uL   Eosinophils Relative 0  0 - 5 %   Eosinophils Absolute 0.0  0.0 - 0.7 K/uL   Basophils Relative 0  0 - 1 %   Basophils Absolute 0.0  0.0 - 0.1 K/uL  COMPREHENSIVE METABOLIC PANEL     Status: Abnormal   Collection Time    07/12/13 10:08 AM      Result Value Range   Sodium 134 (*) 135 - 145 mEq/L   Potassium 3.3 (*) 3.5 - 5.1 mEq/L   Chloride 89 (*) 96 - 112 mEq/L   CO2 24  19 - 32 mEq/L   Glucose, Bld 100 (*) 70 - 99 mg/dL   BUN 37 (*) 6 - 23 mg/dL   Creatinine, Ser 7.82 (*) 0.50 - 1.10 mg/dL   Calcium 7.8 (*) 8.4 - 10.5 mg/dL   Total Protein 5.4 (*) 6.0 - 8.3 g/dL   Albumin 2.2 (*) 3.5 - 5.2 g/dL   AST 17  0 - 37 U/L   ALT 23  0 - 35 U/L   Alkaline Phosphatase 67  39 - 117 U/L   Total Bilirubin 0.3  0.3 - 1.2 mg/dL   GFR calc non Af Amer 9 (*) >90 mL/min   GFR calc Af Amer 10 (*) >90 mL/min   Comment: (NOTE)     The eGFR has been calculated using the CKD EPI equation.     This calculation has not been validated in all clinical situations.     eGFR's persistently <90 mL/min signify possible Chronic Kidney     Disease.  PROTIME-INR  Status: None   Collection Time    07/12/13 10:08 AM      Result Value Range   Prothrombin Time 11.8  11.6 -  15.2 seconds   INR 0.88  0.00 - 1.49  TROPONIN I     Status: None   Collection Time    07/12/13 10:08 AM      Result Value Range   Troponin I <0.30  <0.30 ng/mL   Comment:            Due to the release kinetics of cTnI,     a negative result within the first hours     of the onset of symptoms does not rule out     myocardial infarction with certainty.     If myocardial infarction is still suspected,     repeat the test at appropriate intervals.  CG4 I-STAT (LACTIC ACID)     Status: None   Collection Time    07/12/13 10:33 AM      Result Value Range   Lactic Acid, Venous 2.02  0.5 - 2.2 mmol/L   Ct Abdomen Pelvis Wo Contrast  07/12/2013   CLINICAL DATA:  Fall with head injury and altered level of consciousness.  EXAM: CT CHEST, ABDOMEN AND PELVIS WITHOUT CONTRAST  TECHNIQUE: Multidetector CT imaging of the chest, abdomen and pelvis was performed following the standard protocol without IV contrast.  COMPARISON:  Multiple exams, including 07/12/2013 and 03/21/2009.  FINDINGS: CT CHEST FINDINGS  2.0 cm partially calcified nodule the right thyroid lobe.  Coronary artery atherosclerosis with calcification of the aortic valve and mild cardiomegaly. Diffuse subcutaneous and mediastinal edema.  Moderate and likely loculated right pleural effusion with small left pleural effusion. Passive atelectasis noted. Suspected centrilobular emphysema.  CT ABDOMEN AND PELVIS FINDINGS  Diffuse subcutaneous edema with low grade mesenteric edema, all increased from prior. Trace fluid in the left paracolic gutter. Stable low-density left adrenal mass compatible with adenoma. Mild renal atrophy. Pancreas unremarkable in non-contrast CT appearance.  Aortoiliac atherosclerotic vascular disease noted with scattered small retroperitoneal lymph nodes. Sigmoid diverticulosis. No dilated bowel. Small amount of free pelvic fluid.  IMPRESSION: 1. Diffuse subcutaneous, mediastinal, and mesenteric edema compatible with 3rd spacing  of fluid. 2. Moderate right and small left pleural effusions. The right pleural effusion is probably loculated. 3. Right-sided thyroid nodule. Consider further evaluation with thyroid ultrasound. If patient is clinically hyperthyroid, consider nuclear medicine thyroid uptake and scan. 4. Atherosclerosis. 5. Stable left adrenal adenoma. 6. Small amount of ascites. Please note that today's exam was performed without IV contrast and accordingly does not have high sensitivity for solid organ laceration. 7. Sigmoid diverticulosis. 8. Centrilobular emphysema.   Electronically Signed   By: Herbie Baltimore M.D.   On: 07/12/2013 12:33   Dg Wrist Complete Left  07/12/2013   CLINICAL DATA:  Status post fall  EXAM: LEFT WRIST - COMPLETE 3+ VIEW  FINDINGS: There is no evidence of fracture or dislocation. There is no evidence of arthropathy or other focal bone abnormality. Soft tissues are unremarkable. The bones are osteopenic.  IMPRESSION: Osteopenia without evidence of acute osseous abnormalities.   Electronically Signed   By: Salome Holmes M.D.   On: 07/12/2013 11:53   Ct Head Wo Contrast  07/12/2013   CLINICAL DATA:  Fall  EXAM: CT HEAD WITHOUT CONTRAST  CT CERVICAL SPINE WITHOUT CONTRAST  TECHNIQUE: Multidetector CT imaging of the head and cervical spine was performed following the standard protocol without intravenous contrast. Multiplanar CT  image reconstructions of the cervical spine were also generated.  COMPARISON:  None.  FINDINGS: CT HEAD FINDINGS  No skull fracture is noted. Paranasal sinuses and mastoid air cells are unremarkable. There is skull swelling and subcutaneous stranding in left occipital region. Subcutaneous hematoma in this region measures about 2.3 cm.  No intracranial hemorrhage, mass effect or midline shift. Atherosclerotic calcifications of carotid siphon are noted. Mild cerebral atrophy is noted. Tiny lacunar infarct is noted in right basal ganglia. No acute cortical infarction. No mass  lesion is noted on this unenhanced scan.  CT CERVICAL SPINE FINDINGS  Axial images of the cervical spine shows no acute fracture or subluxation. Computer processed images shows no acute fracture or subluxation. Degenerative changes are noted C1-C2 articulation. Mild disc space flattening with anterior spurring at C5-C6 level. No prevertebral soft tissue swelling. Cervical airway is patent. There is no pneumothorax in visualized left lung apex.  IMPRESSION: 1. No acute intracranial abnormality. There is scalp swelling and subcutaneous stranding in left occipital region. Subcutaneous hematoma in left occipital region measures 2.4 cm. Mild cerebral atrophy. 2. No cervical spine acute fracture or subluxation. Mild degenerative changes.   Electronically Signed   By: Natasha Mead M.D.   On: 07/12/2013 11:22   Ct Chest Wo Contrast  07/12/2013   CLINICAL DATA:  Fall with head injury and altered level of consciousness.  EXAM: CT CHEST, ABDOMEN AND PELVIS WITHOUT CONTRAST  TECHNIQUE: Multidetector CT imaging of the chest, abdomen and pelvis was performed following the standard protocol without IV contrast.  COMPARISON:  Multiple exams, including 07/12/2013 and 03/21/2009.  FINDINGS: CT CHEST FINDINGS  2.0 cm partially calcified nodule the right thyroid lobe.  Coronary artery atherosclerosis with calcification of the aortic valve and mild cardiomegaly. Diffuse subcutaneous and mediastinal edema.  Moderate and likely loculated right pleural effusion with small left pleural effusion. Passive atelectasis noted. Suspected centrilobular emphysema.  CT ABDOMEN AND PELVIS FINDINGS  Diffuse subcutaneous edema with low grade mesenteric edema, all increased from prior. Trace fluid in the left paracolic gutter. Stable low-density left adrenal mass compatible with adenoma. Mild renal atrophy. Pancreas unremarkable in non-contrast CT appearance.  Aortoiliac atherosclerotic vascular disease noted with scattered small retroperitoneal lymph  nodes. Sigmoid diverticulosis. No dilated bowel. Small amount of free pelvic fluid.  IMPRESSION: 1. Diffuse subcutaneous, mediastinal, and mesenteric edema compatible with 3rd spacing of fluid. 2. Moderate right and small left pleural effusions. The right pleural effusion is probably loculated. 3. Right-sided thyroid nodule. Consider further evaluation with thyroid ultrasound. If patient is clinically hyperthyroid, consider nuclear medicine thyroid uptake and scan. 4. Atherosclerosis. 5. Stable left adrenal adenoma. 6. Small amount of ascites. Please note that today's exam was performed without IV contrast and accordingly does not have high sensitivity for solid organ laceration. 7. Sigmoid diverticulosis. 8. Centrilobular emphysema.   Electronically Signed   By: Herbie Baltimore M.D.   On: 07/12/2013 12:33   Ct Cervical Spine Wo Contrast  07/12/2013   CLINICAL DATA:  Fall  EXAM: CT HEAD WITHOUT CONTRAST  CT CERVICAL SPINE WITHOUT CONTRAST  TECHNIQUE: Multidetector CT imaging of the head and cervical spine was performed following the standard protocol without intravenous contrast. Multiplanar CT image reconstructions of the cervical spine were also generated.  COMPARISON:  None.  FINDINGS: CT HEAD FINDINGS  No skull fracture is noted. Paranasal sinuses and mastoid air cells are unremarkable. There is skull swelling and subcutaneous stranding in left occipital region. Subcutaneous hematoma in this region measures about 2.3  cm.  No intracranial hemorrhage, mass effect or midline shift. Atherosclerotic calcifications of carotid siphon are noted. Mild cerebral atrophy is noted. Tiny lacunar infarct is noted in right basal ganglia. No acute cortical infarction. No mass lesion is noted on this unenhanced scan.  CT CERVICAL SPINE FINDINGS  Axial images of the cervical spine shows no acute fracture or subluxation. Computer processed images shows no acute fracture or subluxation. Degenerative changes are noted C1-C2  articulation. Mild disc space flattening with anterior spurring at C5-C6 level. No prevertebral soft tissue swelling. Cervical airway is patent. There is no pneumothorax in visualized left lung apex.  IMPRESSION: 1. No acute intracranial abnormality. There is scalp swelling and subcutaneous stranding in left occipital region. Subcutaneous hematoma in left occipital region measures 2.4 cm. Mild cerebral atrophy. 2. No cervical spine acute fracture or subluxation. Mild degenerative changes.   Electronically Signed   By: Natasha Mead M.D.   On: 07/12/2013 11:22   Dg Pelvis Portable  07/12/2013   CLINICAL DATA:  Fall, weakness and pain.  EXAM: PORTABLE PELVIS 1-2 VIEWS  COMPARISON:  CT abdomen and pelvis 03/21/2009.  FINDINGS: There is no evidence of pelvic fracture or diastasis. No other pelvic bone lesions are seen.  IMPRESSION: Negative exam.   Electronically Signed   By: Drusilla Kanner M.D.   On: 07/12/2013 11:09   Dg Chest Portable 1 View  07/12/2013   CLINICAL DATA:  Status post fall now with weakness history of dialysis dependent renal failure, and diabetes.  EXAM: PORTABLE CHEST - 1 VIEW  COMPARISON:  Dec 28, 2012.  FINDINGS: There is new blunting of the right lateral costophrenic angle. There is no pneumothorax. The observed portions of the overlying ribs appear normal. The left lung is well expanded and clear. The cardiac silhouette is mildly enlarged though stable. The pulmonary vascularity is somewhat indistinct on the right.  IMPRESSION: There is a new right pleural effusion. This may be secondary to the patient's underlying cardiac and renal disease or reflect post traumatic processes. As best as can be determined the right ribs are intact. Followup chest CT scanning may be useful if the patient has experienced significant thoracic trauma.   Electronically Signed   By: David  [place]   On: 07/12/2013 11:07   Dg Humerus Left  07/12/2013   CLINICAL DATA:  Status post fall  EXAM: LEFT HUMERUS - 2+  VIEW  COMPARISON:  None.  FINDINGS: There is no evidence of fracture or other focal bone lesions. Soft tissues are unremarkable.  IMPRESSION: Negative.   Electronically Signed   By: Salome Holmes M.D.   On: 07/12/2013 11:54   Dg Hand Complete Left  07/12/2013   CLINICAL DATA:  Status post fall  EXAM: LEFT HAND - COMPLETE 3+ VIEW  COMPARISON:  None.  FINDINGS: The bones are osteopenic. Mild osteoarthritic changes identified within the proximal and distal interphalangeal joints. There is no evidence of acute fracture nor dislocation.  IMPRESSION: 1. Mild osteoarthritic changes. 2. No evidence of acute osseous abnormalities.   Electronically Signed   By: Salome Holmes M.D.   On: 07/12/2013 11:48    Assessment:  1 ESRD 2 Volume overload 3 s/p Fall 4 Generalized weakness/FTT Plan: 1 Hemodialysis tonight for volume removal, high k bath, repeat HD 11/26  Anita [name] 07/12/2013, 4:21 PM

## 2013-07-12 NOTE — ED Notes (Signed)
Pt reports was walking back into house, missed a step & fell backwards. Hematoma noted to posterior head & skin tear to posterior left hand. Pt denies LOC, back or extremity pain. Pt only c/o pain to head.  Pt A&OX4. A fib with RVR noted on monitor, ED MD aware. Pt denies any CP, palpitations, dizziness.  Pt reports dialysis on Tues, Thurs & Sat but last Saturday's treatment was only 2 hrs instead of 4. Has not been to dialysis yet today.

## 2013-07-12 NOTE — ED Notes (Signed)
Slipped & fell in driveway hitting back of head. Denies LOC. A&OX4 upon arrival

## 2013-07-12 NOTE — ED Provider Notes (Signed)
CSN: [id number]     Arrival date & time 07/12/13  8657 History   First MD Initiated Contact with Patient 07/12/13 1007     Chief Complaint  Patient presents with  . Head Injury  . Trauma   (Consider location/radiation/quality/duration/timing/severity/associated sxs/prior Treatment) HPI Comments: Presents by EMS after mechanical fall. Patient was walking and slipped in the driveway hitting the back of her head. No loss of consciousness. She complains of pain in the left side of her head and left arm. She is not taking any Coumadin. Is a history of dialysis last session was Saturday and she is due today. Denies any chest pain or shortness of breath. Denies abdominal pain nausea vomiting. No back pain, focal weakness, numbness or tingling.  The history is provided by the patient and the EMS personnel.    Past Medical History  Diagnosis Date  . Peripheral neuropathy   . Heart murmur   . ESRD (end stage renal disease)     Chronic Renal Insufficiency  . Diabetic nephropathy   . Proteinuria     Progression  . Hypertension   . Anemia   . Dyslipidemia   . Laryngeal mass   . COPD (chronic obstructive pulmonary disease)   . CHF (congestive heart failure)   . PONV (postoperative nausea and vomiting)   . Shortness of breath     with too much fluid  . GERD (gastroesophageal reflux disease)   . Sleep apnea     Does not use CPAP- due to weight loss  . Diabetes mellitus without complication     pt denies   Past Surgical History  Procedure Laterality Date  . Cholecystectomy    . Appendectomy    . Shoulder arthroscopy Right     w/ repair of rotator cuff repair  . Elbow tendon surgery Right   . Achilles tendon repair Right   . Cesarean section      X 2   . Hemorrhoid surgery      many years ago  . Av fistula placement Left 12/29/2012    Procedure: ARTERIOVENOUS (AV) FISTULA CREATION;  Surgeon: Sherren Kerns, MD;  Location: [facility] OR;  Service: Vascular;  Laterality: Left;  Creation Left  Brachial Cephalic Arteriovenous Fistula  . Cardiac catheterization    . Laryngectomy      mass removed- noncancerous  . Back surgery      lower back   Family History  Problem Relation Age of Onset  . COPD Mother   . Multiple sclerosis Father   . Depression Brother     Suicide  . Heart disease Brother    History  Substance Use Topics  . Smoking status: Current Every Day Smoker -- 1.00 packs/day for 40 years    Types: Cigarettes  . Smokeless tobacco: Never Used  . Alcohol Use: No   OB History   Grav Para Term Preterm Abortions TAB SAB Ect Mult Living                 Review of Systems  Constitutional: Negative for fever, activity change and appetite change.  HENT: Negative for postnasal drip and sneezing.   Respiratory: Negative for cough, chest tightness and shortness of breath.   Cardiovascular: Negative for chest pain.  Gastrointestinal: Negative for nausea, vomiting and abdominal pain.  Genitourinary: Negative for dysuria and hematuria.  Musculoskeletal: Positive for arthralgias and myalgias. Negative for back pain and neck pain.  Neurological: Positive for headaches. Negative for dizziness, weakness and light-headedness.  A complete 10 system review of systems was obtained and all systems are negative except as noted in the HPI and PMH.    Allergies  Doxycycline and Lipitor  Home Medications   No current outpatient prescriptions on file. BP 98/55  Pulse 104  Temp(Src) 97.5 F (36.4 C) (Oral)  Resp 10  Ht 5' 6.5" (1.689 m)  Wt 208 lb 8.9 oz (94.6 kg)  BMI 33.16 kg/m2  SpO2 95% Physical Exam  Constitutional: She is oriented to person, place, and time. She appears well-developed and well-nourished. No distress.  HENT:  Head: Normocephalic.  Mouth/Throat: Oropharynx is clear and moist. No oropharyngeal exudate.  L occipital hematoma  Eyes: Conjunctivae and EOM are normal. Pupils are equal, round, and reactive to light.  Neck: Normal range of motion.   Cardiovascular: Normal rate, regular rhythm and normal heart sounds.   Pulmonary/Chest: Effort normal and breath sounds normal. No respiratory distress.  Abdominal: Soft. There is no tenderness. There is no rebound and no guarding.  Musculoskeletal: Normal range of motion. She exhibits tenderness.  Abrasion dorsum left hand and wrist. No bony tenderness. Dialysis fistula intact with thrill and bruit No T. or L-spine tenderness  Neurological: She is alert and oriented to person, place, and time. No cranial nerve deficit. She exhibits normal muscle tone. Coordination normal.  Moving all extremities, no focal deficits  Skin: Skin is warm.    ED Course  Procedures (including critical care time) Labs Review Labs Reviewed  CBC WITH DIFFERENTIAL - Abnormal; Notable for the following:    HCT 35.4 (*)    RDW 17.2 (*)    All other components within normal limits  COMPREHENSIVE METABOLIC PANEL - Abnormal; Notable for the following:    Sodium 134 (*)    Potassium 3.3 (*)    Chloride 89 (*)    Glucose, Bld 100 (*)    BUN 37 (*)    Creatinine, Ser 4.73 (*)    Calcium 7.8 (*)    Total Protein 5.4 (*)    Albumin 2.2 (*)    GFR calc non Af Amer 9 (*)    GFR calc Af Amer 10 (*)    All other components within normal limits  PROTIME-INR  TROPONIN I  GLUCOSE, CAPILLARY  HEPATITIS B SURFACE ANTIGEN  TSH  T4, FREE  HEMOGLOBIN A1C  CG4 I-STAT (LACTIC ACID)   Imaging Review Ct Abdomen Pelvis Wo Contrast  07/12/2013   CLINICAL DATA:  Fall with head injury and altered level of consciousness.  EXAM: CT CHEST, ABDOMEN AND PELVIS WITHOUT CONTRAST  TECHNIQUE: Multidetector CT imaging of the chest, abdomen and pelvis was performed following the standard protocol without IV contrast.  COMPARISON:  Multiple exams, including 07/12/2013 and 03/21/2009.  FINDINGS: CT CHEST FINDINGS  2.0 cm partially calcified nodule the right thyroid lobe.  Coronary artery atherosclerosis with calcification of the aortic  valve and mild cardiomegaly. Diffuse subcutaneous and mediastinal edema.  Moderate and likely loculated right pleural effusion with small left pleural effusion. Passive atelectasis noted. Suspected centrilobular emphysema.  CT ABDOMEN AND PELVIS FINDINGS  Diffuse subcutaneous edema with low grade mesenteric edema, all increased from prior. Trace fluid in the left paracolic gutter. Stable low-density left adrenal mass compatible with adenoma. Mild renal atrophy. Pancreas unremarkable in non-contrast CT appearance.  Aortoiliac atherosclerotic vascular disease noted with scattered small retroperitoneal lymph nodes. Sigmoid diverticulosis. No dilated bowel. Small amount of free pelvic fluid.  IMPRESSION: 1. Diffuse subcutaneous, mediastinal, and mesenteric edema compatible with 3rd  spacing of fluid. 2. Moderate right and small left pleural effusions. The right pleural effusion is probably loculated. 3. Right-sided thyroid nodule. Consider further evaluation with thyroid ultrasound. If patient is clinically hyperthyroid, consider nuclear medicine thyroid uptake and scan. 4. Atherosclerosis. 5. Stable left adrenal adenoma. 6. Small amount of ascites. Please note that today's exam was performed without IV contrast and accordingly does not have high sensitivity for solid organ laceration. 7. Sigmoid diverticulosis. 8. Centrilobular emphysema.   Electronically Signed   By: Herbie Baltimore M.D.   On: 07/12/2013 12:33   Dg Wrist Complete Left  07/12/2013   CLINICAL DATA:  Status post fall  EXAM: LEFT WRIST - COMPLETE 3+ VIEW  FINDINGS: There is no evidence of fracture or dislocation. There is no evidence of arthropathy or other focal bone abnormality. Soft tissues are unremarkable. The bones are osteopenic.  IMPRESSION: Osteopenia without evidence of acute osseous abnormalities.   Electronically Signed   By: Salome Holmes M.D.   On: 07/12/2013 11:53   Ct Head Wo Contrast  07/12/2013   CLINICAL DATA:  Fall  EXAM: CT  HEAD WITHOUT CONTRAST  CT CERVICAL SPINE WITHOUT CONTRAST  TECHNIQUE: Multidetector CT imaging of the head and cervical spine was performed following the standard protocol without intravenous contrast. Multiplanar CT image reconstructions of the cervical spine were also generated.  COMPARISON:  None.  FINDINGS: CT HEAD FINDINGS  No skull fracture is noted. Paranasal sinuses and mastoid air cells are unremarkable. There is skull swelling and subcutaneous stranding in left occipital region. Subcutaneous hematoma in this region measures about 2.3 cm.  No intracranial hemorrhage, mass effect or midline shift. Atherosclerotic calcifications of carotid siphon are noted. Mild cerebral atrophy is noted. Tiny lacunar infarct is noted in right basal ganglia. No acute cortical infarction. No mass lesion is noted on this unenhanced scan.  CT CERVICAL SPINE FINDINGS  Axial images of the cervical spine shows no acute fracture or subluxation. Computer processed images shows no acute fracture or subluxation. Degenerative changes are noted C1-C2 articulation. Mild disc space flattening with anterior spurring at C5-C6 level. No prevertebral soft tissue swelling. Cervical airway is patent. There is no pneumothorax in visualized left lung apex.  IMPRESSION: 1. No acute intracranial abnormality. There is scalp swelling and subcutaneous stranding in left occipital region. Subcutaneous hematoma in left occipital region measures 2.4 cm. Mild cerebral atrophy. 2. No cervical spine acute fracture or subluxation. Mild degenerative changes.   Electronically Signed   By: Natasha Mead M.D.   On: 07/12/2013 11:22   Ct Chest Wo Contrast  07/12/2013   CLINICAL DATA:  Fall with head injury and altered level of consciousness.  EXAM: CT CHEST, ABDOMEN AND PELVIS WITHOUT CONTRAST  TECHNIQUE: Multidetector CT imaging of the chest, abdomen and pelvis was performed following the standard protocol without IV contrast.  COMPARISON:  Multiple exams,  including 07/12/2013 and 03/21/2009.  FINDINGS: CT CHEST FINDINGS  2.0 cm partially calcified nodule the right thyroid lobe.  Coronary artery atherosclerosis with calcification of the aortic valve and mild cardiomegaly. Diffuse subcutaneous and mediastinal edema.  Moderate and likely loculated right pleural effusion with small left pleural effusion. Passive atelectasis noted. Suspected centrilobular emphysema.  CT ABDOMEN AND PELVIS FINDINGS  Diffuse subcutaneous edema with low grade mesenteric edema, all increased from prior. Trace fluid in the left paracolic gutter. Stable low-density left adrenal mass compatible with adenoma. Mild renal atrophy. Pancreas unremarkable in non-contrast CT appearance.  Aortoiliac atherosclerotic vascular disease noted with scattered small  retroperitoneal lymph nodes. Sigmoid diverticulosis. No dilated bowel. Small amount of free pelvic fluid.  IMPRESSION: 1. Diffuse subcutaneous, mediastinal, and mesenteric edema compatible with 3rd spacing of fluid. 2. Moderate right and small left pleural effusions. The right pleural effusion is probably loculated. 3. Right-sided thyroid nodule. Consider further evaluation with thyroid ultrasound. If patient is clinically hyperthyroid, consider nuclear medicine thyroid uptake and scan. 4. Atherosclerosis. 5. Stable left adrenal adenoma. 6. Small amount of ascites. Please note that today's exam was performed without IV contrast and accordingly does not have high sensitivity for solid organ laceration. 7. Sigmoid diverticulosis. 8. Centrilobular emphysema.   Electronically Signed   By: Herbie Baltimore M.D.   On: 07/12/2013 12:33   Ct Cervical Spine Wo Contrast  07/12/2013   CLINICAL DATA:  Fall  EXAM: CT HEAD WITHOUT CONTRAST  CT CERVICAL SPINE WITHOUT CONTRAST  TECHNIQUE: Multidetector CT imaging of the head and cervical spine was performed following the standard protocol without intravenous contrast. Multiplanar CT image reconstructions of the  cervical spine were also generated.  COMPARISON:  None.  FINDINGS: CT HEAD FINDINGS  No skull fracture is noted. Paranasal sinuses and mastoid air cells are unremarkable. There is skull swelling and subcutaneous stranding in left occipital region. Subcutaneous hematoma in this region measures about 2.3 cm.  No intracranial hemorrhage, mass effect or midline shift. Atherosclerotic calcifications of carotid siphon are noted. Mild cerebral atrophy is noted. Tiny lacunar infarct is noted in right basal ganglia. No acute cortical infarction. No mass lesion is noted on this unenhanced scan.  CT CERVICAL SPINE FINDINGS  Axial images of the cervical spine shows no acute fracture or subluxation. Computer processed images shows no acute fracture or subluxation. Degenerative changes are noted C1-C2 articulation. Mild disc space flattening with anterior spurring at C5-C6 level. No prevertebral soft tissue swelling. Cervical airway is patent. There is no pneumothorax in visualized left lung apex.  IMPRESSION: 1. No acute intracranial abnormality. There is scalp swelling and subcutaneous stranding in left occipital region. Subcutaneous hematoma in left occipital region measures 2.4 cm. Mild cerebral atrophy. 2. No cervical spine acute fracture or subluxation. Mild degenerative changes.   Electronically Signed   By: Natasha Mead M.D.   On: 07/12/2013 11:22   Dg Pelvis Portable  07/12/2013   CLINICAL DATA:  Fall, weakness and pain.  EXAM: PORTABLE PELVIS 1-2 VIEWS  COMPARISON:  CT abdomen and pelvis 03/21/2009.  FINDINGS: There is no evidence of pelvic fracture or diastasis. No other pelvic bone lesions are seen.  IMPRESSION: Negative exam.   Electronically Signed   By: Drusilla Kanner M.D.   On: 07/12/2013 11:09   Dg Chest Portable 1 View  07/12/2013   CLINICAL DATA:  Status post fall now with weakness history of dialysis dependent renal failure, and diabetes.  EXAM: PORTABLE CHEST - 1 VIEW  COMPARISON:  Dec 28, 2012.   FINDINGS: There is new blunting of the right lateral costophrenic angle. There is no pneumothorax. The observed portions of the overlying ribs appear normal. The left lung is well expanded and clear. The cardiac silhouette is mildly enlarged though stable. The pulmonary vascularity is somewhat indistinct on the right.  IMPRESSION: There is a new right pleural effusion. This may be secondary to the patient's underlying cardiac and renal disease or reflect post traumatic processes. As best as can be determined the right ribs are intact. Followup chest CT scanning may be useful if the patient has experienced significant thoracic trauma.   Electronically Signed  By: David  [place]   On: 07/12/2013 11:07   Dg Humerus Left  07/12/2013   CLINICAL DATA:  Status post fall  EXAM: LEFT HUMERUS - 2+ VIEW  COMPARISON:  None.  FINDINGS: There is no evidence of fracture or other focal bone lesions. Soft tissues are unremarkable.  IMPRESSION: Negative.   Electronically Signed   By: Salome Holmes M.D.   On: 07/12/2013 11:54   Dg Hand Complete Left  07/12/2013   CLINICAL DATA:  Status post fall  EXAM: LEFT HAND - COMPLETE 3+ VIEW  COMPARISON:  None.  FINDINGS: The bones are osteopenic. Mild osteoarthritic changes identified within the proximal and distal interphalangeal joints. There is no evidence of acute fracture nor dislocation.  IMPRESSION: 1. Mild osteoarthritic changes. 2. No evidence of acute osseous abnormalities.   Electronically Signed   By: Salome Holmes M.D.   On: 07/12/2013 11:48    EKG Interpretation    Date/Time:  Tuesday July 12 2013 10:08:02 EST Ventricular Rate:  129 PR Interval:    QRS Duration: 100 QT Interval:  317 QTC Calculation: 464 R Axis:   32 Text Interpretation:  Atrial fibrillation Borderline low voltage, extremity leads Repol abnrm suggests ischemia, diffuse leads atrial  Confirmed by Wilton Thrall  MD, Sandrina Heaton ([id number]) on 07/12/2013 10:18:32 AM            MDM   1. Atrial  fibrillation   2. Head injury, initial encounter   3. Pleural effusion   4. Hypoxia    Mechanical fall with head injury. No loss of consciousness. No anticoagulants. Atrial fibrillation appears to be new. No neck or back pain. No chest pain or abdominal pain.  CT head is negative for intracranial pathology. C-spine cleared after CT scan. Chest x-ray shows new right sided pleural effusion. No evidence of trauma to the right chest wall.  She has new oxygen requirement. She does not wear his oxygen at home. She has no signs of trauma to her right chest and no rib fractures. Suspect effusion is secondary to her renal disease, volume overload, and need for dialysis. Doubt hemothorax.  Vitals stable in the ED.  Rate controlled atrial fibrillation in the 100-110s. BP stable.   Will need admission in setting of new atrial fibrillation with pleural effusion and hypoxia. She is due for dialysis today. Potassium is normal. Unassigned admission discussed with Dr. Dierdre Searles.  CRITICAL CARE Performed by: Glynn Octave Total critical care time: 30 Critical care time was exclusive of separately billable procedures and treating other patients. Critical care was necessary to treat or prevent imminent or life-threatening deterioration. Critical care was time spent personally by me on the following activities: development of treatment plan with patient and/or surrogate as well as nursing, discussions with consultants, evaluation of patient's response to treatment, examination of patient, obtaining history from patient or surrogate, ordering and performing treatments and interventions, ordering and review of laboratory studies, ordering and review of radiographic studies, pulse oximetry and re-evaluation of patient's condition.  BP 98/55  Pulse 104  Temp(Src) 97.5 F (36.4 C) (Oral)  Resp 10  Ht 5' 6.5" (1.689 m)  Wt 208 lb 8.9 oz (94.6 kg)  BMI 33.16 kg/m2  SpO2 95%      Glynn Octave, MD 07/12/13  [phone]

## 2013-07-12 NOTE — ED Notes (Signed)
Patient transported to X-ray & CT scan with this nurse

## 2013-07-12 NOTE — Progress Notes (Addendum)
Hemodialysis- Pt experiencing low bp and increased HR on HD. Asymptomatic. BP drop after 10 minutes of initiation. Goal reduced. With 1.75 hours remaining bp drop into 70s-80s/40s unrelieved by 200cc saline bolus, hob down, o2 administered. Dr. Kathrene Bongo paged. Gave 25g albumin. BP remains stable upper 90s-low 100s/50s. Pt has no complaints other than back discomfort, repositioning frequently.   Post weight Standing: 88.8kg

## 2013-07-12 NOTE — Progress Notes (Signed)
Called ED for the second time that 2C11 is ready.

## 2013-07-12 NOTE — H&P (Signed)
Date: 07/12/2013               Patient Name:  Anita [name] MRN: [id number]  DOB: [birth year] Age / Sex: 62 y.o., female   PCP: Milana Obey, MD              Medical Service: Internal Medicine Teaching Service              Attending Physician: Dr. Inez Catalina, MD    First Contact: Lewie Chamber, MS 4 Pager: [phone]  Second Contact: Dr. Johna Roles Pager: [phone]  Third Contact Dr. Burtis Junes Pager: [phone]         After Hours (After 5p/  First Contact Pager: [phone]  weekends / holidays): Second Contact Pager: [phone]   Chief Complaint: s/p fall without LOC  History of Present Illness: Anita [name] is a 62 year old caucasian female with significant PMH of ESRD (2/2 DM, began 03/2013 --TTS), DM (10yrs, no A1C), HTN (70yrs), anemia, COPD, Grade II diastolic CHF (EF 84-13%), sleep apnea who presents to the ED after a fall at home as she was attempting to go up her steps back into the house from outside.   She notes that since beginning dialysis in August 2014 she has become progressively weaker in all 4 extremities and when she grabbed the handrail she lost grip and fell backwards onto the pavement. She denied any LOC but did note that she struck her head when she fell and her left side. She waved down a neighbor passing by who called 911 and the patient was transported to the ED. She denied any other complaints on arrival and has been alert and oriented.   She endorses weakness in all extremities since HD was started 03/2013, chronic constipation, and some increased swelling in her legs above baseline. She denies any cough, fevers, sore throats, chills, night sweats, SOB, CP, or N/V/D.    Meds: Current Facility-Administered Medications  Medication Dose Route Frequency Provider Last Rate Last Dose  . 0.9 %  sodium chloride infusion  100 mL Intravenous PRN Lauris Poag, MD      . 0.9 %  sodium chloride infusion  100 mL Intravenous PRN Lauris Poag, MD      . alteplase  (CATHFLO ACTIVASE) injection 2 mg  2 mg Intracatheter Once PRN Lauris Poag, MD      . Melene Muller ON 07/13/2013] calcitRIOL (ROCALTROL) capsule 0.25 mcg  0.25 mcg Oral Q M,W,F Christen Bame, MD      . calcium acetate (PHOSLO) capsule 667 mg  667 mg Oral TID WC Christen Bame, MD      . cloNIDine (CATAPRES) tablet 0.2 mg  0.2 mg Oral BID Christen Bame, MD      . feeding supplement (NEPRO CARB STEADY) liquid 237 mL  237 mL Oral PRN Lauris Poag, MD      . furosemide (LASIX) tablet 80 mg  80 mg Oral BID Christen Bame, MD      . heparin injection 1,000 Units  1,000 Units Dialysis PRN Lauris Poag, MD      . heparin injection 1,000 Units  1,000 Units Dialysis Once in dialysis Lauris Poag, MD      . heparin injection 5,000 Units  5,000 Units Subcutaneous Q8H Christen Bame, MD      . HYDROcodone-acetaminophen (NORCO) 7.5-325 MG per tablet 1 tablet  1 tablet Oral Q6H PRN Christen Bame, MD      . lidocaine (PF) (XYLOCAINE) 1 %  injection 5 mL  5 mL Intradermal PRN Lauris Poag, MD      . lidocaine-prilocaine (EMLA) cream 1 application  1 application Topical PRN Lauris Poag, MD      . pantoprazole (PROTONIX) EC tablet 40 mg  40 mg Oral Daily Christen Bame, MD      . pentafluoroprop-tetrafluoroeth (GEBAUERS) aerosol 1 application  1 application Topical PRN Lauris Poag, MD      . polyethylene glycol powder (GLYCOLAX/MIRALAX) container 17 g  17 g Oral Daily Christen Bame, MD      . sodium chloride 0.9 % injection 3 mL  3 mL Intravenous Q12H Christen Bame, MD      . zolpidem (AMBIEN) tablet 5 mg  5 mg Oral QHS Christen Bame, MD        Allergies: Allergies as of 07/12/2013 - Review Complete 07/12/2013  Allergen Reaction Noted  . Doxycycline Nausea And Vomiting 12/02/2012  . Lipitor [atorvastatin] Nausea And Vomiting 12/02/2012   Past Medical History  Diagnosis Date  . Peripheral neuropathy   . Heart murmur   . ESRD (end stage renal disease)     Chronic Renal Insufficiency  . Diabetic nephropathy   . Proteinuria      Progression  . Hypertension   . Anemia   . Dyslipidemia   . Laryngeal mass   . COPD (chronic obstructive pulmonary disease)   . CHF (congestive heart failure)   . PONV (postoperative nausea and vomiting)   . Shortness of breath     with too much fluid  . GERD (gastroesophageal reflux disease)   . Sleep apnea     Does not use CPAP- due to weight loss  . Diabetes mellitus without complication     pt denies   Past Surgical History  Procedure Laterality Date  . Cholecystectomy    . Appendectomy    . Shoulder arthroscopy Right     w/ repair of rotator cuff repair  . Elbow tendon surgery Right   . Achilles tendon repair Right   . Cesarean section      X 2   . Hemorrhoid surgery      many years ago  . Av fistula placement Left 12/29/2012    Procedure: ARTERIOVENOUS (AV) FISTULA CREATION;  Surgeon: Sherren Kerns, MD;  Location: [facility] OR;  Service: Vascular;  Laterality: Left;  Creation Left Brachial Cephalic Arteriovenous Fistula  . Cardiac catheterization    . Laryngectomy      mass removed- noncancerous  . Back surgery      lower back   Family History  Problem Relation Age of Onset  . COPD Mother   . Multiple sclerosis Father   . Depression Brother     Suicide  . Heart disease Brother    History   Social History  . Marital Status: Married    Spouse Name: N/A    Number of Children: N/A  . Years of Education: N/A   Occupational History  . 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 Hair dresser    Social History Main Topics  . Smoking status: Current Every Day Smoker -- 1.00 packs/day for 40 years    Types: Cigarettes  . Smokeless tobacco: Never Used  . Alcohol Use: No  . Drug Use: No  . Sexual Activity: Not on file   Other Topics Concern  . Not on file   Social History Narrative   62 yo woman who lives at  home with her husband of 44 years. She has a 40 pack year history of smoking, denies etoh or illicit drug use. She is retired and has had many jobs  throughout her life, notably hair dressing as well as working 30 years in a [employer].     Review of Systems: Constitutional: positive for weakness, negative for chills, fevers and night sweats Eyes: negative for redness and visual disturbance Respiratory: positive for wheezing, negative for cough, hemoptysis, pneumonia and sputum Cardiovascular: positive for irregular heart beat, negative for chest pain, chest pressure/discomfort and palpitations Gastrointestinal: positive for constipation Musculoskeletal:positive for muscle weakness Neurological: negative  Physical Exam: Blood pressure 125/56, pulse 98, temperature 97.7 F (36.5 C), temperature source Oral, resp. rate 16, height 5' 6.5" (1.689 m), weight 90.719 kg (200 lb), SpO2 100.00%. BP 125/56  Pulse 98  Temp(Src) 97.7 F (36.5 C) (Oral)  Resp 16  Ht 5' 6.5" (1.689 m)  Wt 90.719 kg (200 lb)  BMI 31.80 kg/m2  SpO2 100% General appearance: alert, cooperative and no distress Head: posterior scalp hematoma Eyes: conjunctivae/corneas clear. PERRL, EOM's intact. Fundi benign. Throat: lips, mucosa, and tongue normal; teeth and gums normal Back: symmetric, no curvature. ROM normal. No CVA tenderness. Lungs: rales bibasilar and wheezes LLL Heart: irregularly irregular rhythm Abdomen: soft, non-tender; bowel sounds normal; no masses,  no organomegaly Extremities: edema 3+ pitting edema foot to subpatellar Pulses: unable to appreciate PT, DP pulses Skin: LE cool to touch   Lab results: Basic Metabolic Panel:  Recent Labs  16/10/96 1008  NA 134*  K 3.3*  CL 89*  CO2 24  GLUCOSE 100*  BUN 37*  CREATININE 4.73*  CALCIUM 7.8*   Liver Function Tests:  Recent Labs  07/12/13 1008  AST 17  ALT 23  ALKPHOS 67  BILITOT 0.3  PROT 5.4*  ALBUMIN 2.2*   No results found for this basename: LIPASE, AMYLASE,  in the last 72 hours No results found for this basename: AMMONIA,  in the last 72  hours CBC:  Recent Labs  07/12/13 1008  WBC 7.3  NEUTROABS 5.1  HGB 12.3  HCT 35.4*  MCV 84.5  PLT 210   Cardiac Enzymes:  Recent Labs  07/12/13 1008  TROPONINI <0.30   BNP: No results found for this basename: PROBNP,  in the last 72 hours D-Dimer: No results found for this basename: DDIMER,  in the last 72 hours CBG: No results found for this basename: GLUCAP,  in the last 72 hours Hemoglobin A1C: No results found for this basename: HGBA1C,  in the last 72 hours Fasting Lipid Panel: No results found for this basename: CHOL, HDL, LDLCALC, TRIG, CHOLHDL, LDLDIRECT,  in the last 72 hours Thyroid Function Tests: No results found for this basename: TSH, T4TOTAL, FREET4, T3FREE, THYROIDAB,  in the last 72 hours Anemia Panel: No results found for this basename: VITAMINB12, FOLATE, FERRITIN, TIBC, IRON, RETICCTPCT,  in the last 72 hours Coagulation:  Recent Labs  07/12/13 1008  LABPROT 11.8  INR 0.88   Urine Drug Screen: Drugs of Abuse  No results found for this basename: labopia,  cocainscrnur,  labbenz,  amphetmu,  thcu,  labbarb    Alcohol Level: No results found for this basename: ETH,  in the last 72 hours Urinalysis: No results found for this basename: COLORURINE, APPERANCEUR, LABSPEC, PHURINE, GLUCOSEU, HGBUR, BILIRUBINUR, KETONESUR, PROTEINUR, UROBILINOGEN, NITRITE, LEUKOCYTESUR,  in the last 72 hours  Imaging results:  Ct Abdomen Pelvis Wo Contrast  07/12/2013   CLINICAL DATA:  Fall with head injury and altered level of consciousness.  EXAM: CT CHEST, ABDOMEN AND PELVIS WITHOUT CONTRAST  TECHNIQUE: Multidetector CT imaging of the chest, abdomen and pelvis was performed following the standard protocol without IV contrast.  COMPARISON:  Multiple exams, including 07/12/2013 and 03/21/2009.  FINDINGS: CT CHEST FINDINGS  2.0 cm partially calcified nodule the right thyroid lobe.  Coronary artery atherosclerosis with calcification of the aortic valve and mild  cardiomegaly. Diffuse subcutaneous and mediastinal edema.  Moderate and likely loculated right pleural effusion with small left pleural effusion. Passive atelectasis noted. Suspected centrilobular emphysema.  CT ABDOMEN AND PELVIS FINDINGS  Diffuse subcutaneous edema with low grade mesenteric edema, all increased from prior. Trace fluid in the left paracolic gutter. Stable low-density left adrenal mass compatible with adenoma. Mild renal atrophy. Pancreas unremarkable in non-contrast CT appearance.  Aortoiliac atherosclerotic vascular disease noted with scattered small retroperitoneal lymph nodes. Sigmoid diverticulosis. No dilated bowel. Small amount of free pelvic fluid.  IMPRESSION: 1. Diffuse subcutaneous, mediastinal, and mesenteric edema compatible with 3rd spacing of fluid. 2. Moderate right and small left pleural effusions. The right pleural effusion is probably loculated. 3. Right-sided thyroid nodule. Consider further evaluation with thyroid ultrasound. If patient is clinically hyperthyroid, consider nuclear medicine thyroid uptake and scan. 4. Atherosclerosis. 5. Stable left adrenal adenoma. 6. Small amount of ascites. Please note that today's exam was performed without IV contrast and accordingly does not have high sensitivity for solid organ laceration. 7. Sigmoid diverticulosis. 8. Centrilobular emphysema.   Electronically Signed   By: Herbie Baltimore M.D.   On: 07/12/2013 12:33   Dg Wrist Complete Left  07/12/2013   CLINICAL DATA:  Status post fall  EXAM: LEFT WRIST - COMPLETE 3+ VIEW  FINDINGS: There is no evidence of fracture or dislocation. There is no evidence of arthropathy or other focal bone abnormality. Soft tissues are unremarkable. The bones are osteopenic.  IMPRESSION: Osteopenia without evidence of acute osseous abnormalities.   Electronically Signed   By: Salome Holmes M.D.   On: 07/12/2013 11:53   Ct Head Wo Contrast  07/12/2013   CLINICAL DATA:  Fall  EXAM: CT HEAD WITHOUT  CONTRAST  CT CERVICAL SPINE WITHOUT CONTRAST  TECHNIQUE: Multidetector CT imaging of the head and cervical spine was performed following the standard protocol without intravenous contrast. Multiplanar CT image reconstructions of the cervical spine were also generated.  COMPARISON:  None.  FINDINGS: CT HEAD FINDINGS  No skull fracture is noted. Paranasal sinuses and mastoid air cells are unremarkable. There is skull swelling and subcutaneous stranding in left occipital region. Subcutaneous hematoma in this region measures about 2.3 cm.  No intracranial hemorrhage, mass effect or midline shift. Atherosclerotic calcifications of carotid siphon are noted. Mild cerebral atrophy is noted. Tiny lacunar infarct is noted in right basal ganglia. No acute cortical infarction. No mass lesion is noted on this unenhanced scan.  CT CERVICAL SPINE FINDINGS  Axial images of the cervical spine shows no acute fracture or subluxation. Computer processed images shows no acute fracture or subluxation. Degenerative changes are noted C1-C2 articulation. Mild disc space flattening with anterior spurring at C5-C6 level. No prevertebral soft tissue swelling. Cervical airway is patent. There is no pneumothorax in visualized left lung apex.  IMPRESSION: 1. No acute intracranial abnormality. There is scalp swelling and subcutaneous stranding in left occipital region. Subcutaneous hematoma in left occipital region measures 2.4 cm. Mild cerebral atrophy. 2. No cervical spine acute fracture or subluxation.  Mild degenerative changes.   Electronically Signed   By: Natasha Mead M.D.   On: 07/12/2013 11:22   Ct Chest Wo Contrast  07/12/2013   CLINICAL DATA:  Fall with head injury and altered level of consciousness.  EXAM: CT CHEST, ABDOMEN AND PELVIS WITHOUT CONTRAST  TECHNIQUE: Multidetector CT imaging of the chest, abdomen and pelvis was performed following the standard protocol without IV contrast.  COMPARISON:  Multiple exams, including  07/12/2013 and 03/21/2009.  FINDINGS: CT CHEST FINDINGS  2.0 cm partially calcified nodule the right thyroid lobe.  Coronary artery atherosclerosis with calcification of the aortic valve and mild cardiomegaly. Diffuse subcutaneous and mediastinal edema.  Moderate and likely loculated right pleural effusion with small left pleural effusion. Passive atelectasis noted. Suspected centrilobular emphysema.  CT ABDOMEN AND PELVIS FINDINGS  Diffuse subcutaneous edema with low grade mesenteric edema, all increased from prior. Trace fluid in the left paracolic gutter. Stable low-density left adrenal mass compatible with adenoma. Mild renal atrophy. Pancreas unremarkable in non-contrast CT appearance.  Aortoiliac atherosclerotic vascular disease noted with scattered small retroperitoneal lymph nodes. Sigmoid diverticulosis. No dilated bowel. Small amount of free pelvic fluid.  IMPRESSION: 1. Diffuse subcutaneous, mediastinal, and mesenteric edema compatible with 3rd spacing of fluid. 2. Moderate right and small left pleural effusions. The right pleural effusion is probably loculated. 3. Right-sided thyroid nodule. Consider further evaluation with thyroid ultrasound. If patient is clinically hyperthyroid, consider nuclear medicine thyroid uptake and scan. 4. Atherosclerosis. 5. Stable left adrenal adenoma. 6. Small amount of ascites. Please note that today's exam was performed without IV contrast and accordingly does not have high sensitivity for solid organ laceration. 7. Sigmoid diverticulosis. 8. Centrilobular emphysema.   Electronically Signed   By: Herbie Baltimore M.D.   On: 07/12/2013 12:33   Ct Cervical Spine Wo Contrast  07/12/2013   CLINICAL DATA:  Fall  EXAM: CT HEAD WITHOUT CONTRAST  CT CERVICAL SPINE WITHOUT CONTRAST  TECHNIQUE: Multidetector CT imaging of the head and cervical spine was performed following the standard protocol without intravenous contrast. Multiplanar CT image reconstructions of the cervical  spine were also generated.  COMPARISON:  None.  FINDINGS: CT HEAD FINDINGS  No skull fracture is noted. Paranasal sinuses and mastoid air cells are unremarkable. There is skull swelling and subcutaneous stranding in left occipital region. Subcutaneous hematoma in this region measures about 2.3 cm.  No intracranial hemorrhage, mass effect or midline shift. Atherosclerotic calcifications of carotid siphon are noted. Mild cerebral atrophy is noted. Tiny lacunar infarct is noted in right basal ganglia. No acute cortical infarction. No mass lesion is noted on this unenhanced scan.  CT CERVICAL SPINE FINDINGS  Axial images of the cervical spine shows no acute fracture or subluxation. Computer processed images shows no acute fracture or subluxation. Degenerative changes are noted C1-C2 articulation. Mild disc space flattening with anterior spurring at C5-C6 level. No prevertebral soft tissue swelling. Cervical airway is patent. There is no pneumothorax in visualized left lung apex.  IMPRESSION: 1. No acute intracranial abnormality. There is scalp swelling and subcutaneous stranding in left occipital region. Subcutaneous hematoma in left occipital region measures 2.4 cm. Mild cerebral atrophy. 2. No cervical spine acute fracture or subluxation. Mild degenerative changes.   Electronically Signed   By: Natasha Mead M.D.   On: 07/12/2013 11:22   Dg Pelvis Portable  07/12/2013   CLINICAL DATA:  Fall, weakness and pain.  EXAM: PORTABLE PELVIS 1-2 VIEWS  COMPARISON:  CT abdomen and pelvis 03/21/2009.  FINDINGS:  There is no evidence of pelvic fracture or diastasis. No other pelvic bone lesions are seen.  IMPRESSION: Negative exam.   Electronically Signed   By: Drusilla Kanner M.D.   On: 07/12/2013 11:09   Dg Chest Portable 1 View  07/12/2013   CLINICAL DATA:  Status post fall now with weakness history of dialysis dependent renal failure, and diabetes.  EXAM: PORTABLE CHEST - 1 VIEW  COMPARISON:  Dec 28, 2012.  FINDINGS:  There is new blunting of the right lateral costophrenic angle. There is no pneumothorax. The observed portions of the overlying ribs appear normal. The left lung is well expanded and clear. The cardiac silhouette is mildly enlarged though stable. The pulmonary vascularity is somewhat indistinct on the right.  IMPRESSION: There is a new right pleural effusion. This may be secondary to the patient's underlying cardiac and renal disease or reflect post traumatic processes. As best as can be determined the right ribs are intact. Followup chest CT scanning may be useful if the patient has experienced significant thoracic trauma.   Electronically Signed   By: Cleon Thoma  [place]   On: 07/12/2013 11:07   Dg Humerus Left  07/12/2013   CLINICAL DATA:  Status post fall  EXAM: LEFT HUMERUS - 2+ VIEW  COMPARISON:  None.  FINDINGS: There is no evidence of fracture or other focal bone lesions. Soft tissues are unremarkable.  IMPRESSION: Negative.   Electronically Signed   By: Salome Holmes M.D.   On: 07/12/2013 11:54   Dg Hand Complete Left  07/12/2013   CLINICAL DATA:  Status post fall  EXAM: LEFT HAND - COMPLETE 3+ VIEW  COMPARISON:  None.  FINDINGS: The bones are osteopenic. Mild osteoarthritic changes identified within the proximal and distal interphalangeal joints. There is no evidence of acute fracture nor dislocation.  IMPRESSION: 1. Mild osteoarthritic changes. 2. No evidence of acute osseous abnormalities.   Electronically Signed   By: Salome Holmes M.D.   On: 07/12/2013 11:48    Other results: EKG: atrial fibrillation, rate 129  Assessment & Plan by Problem: Principal Problem:   Dyspnea Active Problems:   Accidental fall on or from sidewalk curb  Patient is a 62 yo woman with PMH ESRD (TTS), DM, HTN, COPD, CHF who presented s/p fall at home with no LOC and no acute findings on head/brain imaging studies. However, other imaging concerning for loculated right pleural effusion with symptomatic dyspnea and  weakness.   # Pleural effusion  Symptomatic at home with dyspnea and CT chest reveals likely loculated right pleural effusion and small left effusion. She has no s/s of infectious process (afebrile, no leukocytosis, normal lactic acid) and given her mild symptoms prior to admission this is likely a chronic problem. She meets indication criteria for diagnostic thoracentesis (Bilateral effusions that are of markedly disparate sizes, effusion does not resolve with HF therapy). Differential diagnosis includes decompensating CHF, pneumonia, or occupational lung disease (pneumoconiosis). - continue oxygen with O2 goal >90% - will consider diagnostic thoracentesis in am or if symptoms worsen or develops s/s of infection. Will reassess after dialysis.  # ESRD  Patient began dialysis in August 2014 via left arm fistula. She missed her outpt dialysis session today and had a short session on Saturday, 2 hrs vs. 4 hrs (3 days prior to admission). She has significant signs of volume overload given her LE edema, crackles, and dyspnea.  She also notes weakness since beginning HD. - appreciate nephrology consult and will follow recommendations. Plan is  HD tonight 11/25 and repeat again 11/26 - renal diet - will continue home calcitriol, calcium acetate, and lasix - check TSH  # HTN  Long standing history of hypertension and pressure has been well controlled. Will continue to monitor while inpatient. - continue clonidine and lasix  #DM  Patient states she no longer is on medication for her diabetes. ESRD 2/2 DM. All documented random glucose levels <200 per epic. Will continue to monitor CBG and consider A1C if suspicion for uncontrolled DM.  #COPD  Patient has 40 pack year smoking history and is current smoker.  - will provide smoking cessation counseling  #CHF  Grade II diastolic dysfunction with preserved EF 65-70%, via April 2014 echo. She has s/s of volume overload seen with B/L pleural effusions  and increased LE edema.  - continue lasix   #GERD  Well controlled history of reflux with no complaints on admission - continue PPI  #Constipation  Patient notes she developed significant constipation s/p initiation of dialysis. She takes miralax at home daily with good response. Will resume home med and monitor. - continue miralax    This is a Psychologist, occupational Note.  The care of the patient was discussed with Dr. Otis Brace and the assessment and plan was formulated with their assistance.  Please see their note for official documentation of the patient encounter.   Signed: Lewie Chamber, Med Student 07/12/2013, 5:13 PM

## 2013-07-12 NOTE — H&P (Signed)
Date: 07/12/2013               Patient Name:  Anita [name] MRN: [id number]  DOB: 1951/08/14 Age / Sex: 62 y.o., female   PCP: Milana Obey, MD         Medical Service: Internal Medicine Teaching Service         Attending Physician: Dr. Inez Catalina, MD    First Contact: Dr. Johna Roles Pager: [phone]  Second Contact: Dr. Burtis Junes Pager: [phone]       After Hours (After 5p/  First Contact Pager: [phone]  weekends / holidays): Second Contact Pager: [phone]   Chief Complaint: weakness and fall   History of Present Illness:   Anita [name]. Harms is a 62 year old female with past medical history of ESRD on HD  (TTS) since 03/2013, HTN, HL, T2DM, COPD, diastolic CHF, OSA, and GERD who presents with weakness and recent fall. Pt states that she was returning to her front door step and lost her grip causing her to fall backwards unto the ground resulting in hitting her head without LOC. Pt reports a knot on the left side of the back of her head without visual disturbance. She also suffered some cuts to her arms and wrists after the fall.     Pt reports she has been feeling weak ever since starting HD in August of this year and has had persistent LE edema. She had HD on Saturday but due to problems with AV fistula, the session was stopped 2 hrs earlier. She is anuric but has leaks with diuretic therapy which she is complaint with (lasix 80 mg BID).  She has orthopnea and occasionally PND.  Also with fatigue, constipation, dry skin, decreased appetite with unintentional weight loss of 40 lb in 6 weeks, and difficulty swallowing for the  Last couple of weeks, mostly with pills.  She denies dyspnea (excpet when she over exerts herself), chest pain, cough, choking, neck enlargement, palpitations, tremor, diaphoresis, or anxiety. Pt has history of thyroid mass that was removed and per patient was benign. No FH of thyroid disease or radiation to head and neck. She denies trauma to the chest.   Pt smokes  about 0.5 pack a day (1 pack before starting HD) since age 64. She has history of COPD not on home oxygen and does not use any inhalers with no recent exacerbations, history of intubation, or recent steroid or antibiotic use.   Pt reports she is compliant with taking her anti-hypertensives (lisinopril, clonidine, hydralazine, & atenolol). She was previously on insulin but states she was removed from it and other diabetic medications for the past 6-8 months because she did not have diabetes any longer.     Meds: No current facility-administered medications on file prior to encounter.   Current Outpatient Prescriptions on File Prior to Encounter  Medication Sig Dispense Refill  . atenolol (TENORMIN) 50 MG tablet Take 50 mg by mouth 2 (two) times daily.       . calcitRIOL (ROCALTROL) 0.25 MCG capsule Take 0.25 mcg by mouth every Monday, Wednesday, and Friday.       . calcium acetate (PHOSLO) 667 MG capsule Take 667 mg by mouth 3 (three) times daily with meals.      Marland Kitchen Cinnamon ([facility] CINNAMON) 500 MG capsule Take 500 mg by mouth daily.      . cloNIDine (CATAPRES) 0.2 MG tablet Take 0.2 mg by mouth 2 (two) times daily.      . Cyanocobalamin (  VITAMIN B 12 PO) Take 1,000 mg by mouth daily.      . furosemide (LASIX) 40 MG tablet Take 80 mg by mouth 2 (two) times daily.       . hydrALAZINE (APRESOLINE) 25 MG tablet Take 25 mg by mouth 2 (two) times daily.       Marland Kitchen HYDROcodone-acetaminophen (NORCO) 7.5-325 MG per tablet Take 1 tablet by mouth every 6 (six) hours as needed for pain.  30 tablet  0  . lisinopril (PRINIVIL,ZESTRIL) 20 MG tablet Take 20 mg by mouth 2 (two) times daily.       Marland Kitchen omeprazole (PRILOSEC) 20 MG capsule Take 20 mg by mouth daily as needed (acid reflux).       . polyethylene glycol powder (GLYCOLAX/MIRALAX) powder Take 17 g by mouth daily.  527 g  0  . zolpidem (AMBIEN) 10 MG tablet Take 10 mg by mouth at bedtime.         Current Facility-Administered Medications  Medication Dose Route  Frequency Provider Last Rate Last Dose  . 0.9 %  sodium chloride infusion  100 mL Intravenous PRN Lauris Poag, MD      . 0.9 %  sodium chloride infusion  100 mL Intravenous PRN Lauris Poag, MD      . alteplase (CATHFLO ACTIVASE) injection 2 mg  2 mg Intracatheter Once PRN Lauris Poag, MD      . Melene Muller ON 07/13/2013] calcitRIOL (ROCALTROL) capsule 0.25 mcg  0.25 mcg Oral Q M,W,F Christen Bame, MD      . calcium acetate (PHOSLO) capsule 667 mg  667 mg Oral TID WC Christen Bame, MD      . cloNIDine (CATAPRES) tablet 0.2 mg  0.2 mg Oral BID Christen Bame, MD      . feeding supplement (NEPRO CARB STEADY) liquid 237 mL  237 mL Oral PRN Lauris Poag, MD      . furosemide (LASIX) tablet 80 mg  80 mg Oral BID Christen Bame, MD      . heparin injection 1,000 Units  1,000 Units Dialysis PRN Lauris Poag, MD      . heparin injection 1,000 Units  1,000 Units Dialysis Once in dialysis Lauris Poag, MD      . heparin injection 5,000 Units  5,000 Units Subcutaneous Q8H Christen Bame, MD      . HYDROcodone-acetaminophen (NORCO) 7.5-325 MG per tablet 1 tablet  1 tablet Oral Q6H PRN Christen Bame, MD      . lidocaine (PF) (XYLOCAINE) 1 % injection 5 mL  5 mL Intradermal PRN Lauris Poag, MD      . lidocaine-prilocaine (EMLA) cream 1 application  1 application Topical PRN Lauris Poag, MD      . pantoprazole (PROTONIX) EC tablet 40 mg  40 mg Oral Daily Christen Bame, MD      . pentafluoroprop-tetrafluoroeth (GEBAUERS) aerosol 1 application  1 application Topical PRN Lauris Poag, MD      . polyethylene glycol powder (GLYCOLAX/MIRALAX) container 17 g  17 g Oral Daily Christen Bame, MD      . sodium chloride 0.9 % injection 3 mL  3 mL Intravenous Q12H Christen Bame, MD      . zolpidem (AMBIEN) tablet 5 mg  5 mg Oral QHS Christen Bame, MD        Allergies: Allergies as of 07/12/2013 - Review Complete 07/12/2013  Allergen Reaction Noted  . Doxycycline Nausea And Vomiting 12/02/2012  . Lipitor [atorvastatin]  Nausea And  Vomiting 12/02/2012   Past Medical History  Diagnosis Date  . Peripheral neuropathy   . Heart murmur   . ESRD (end stage renal disease)     Chronic Renal Insufficiency  . Diabetic nephropathy   . Proteinuria     Progression  . Hypertension   . Anemia   . Dyslipidemia   . Laryngeal mass   . COPD (chronic obstructive pulmonary disease)   . CHF (congestive heart failure)   . PONV (postoperative nausea and vomiting)   . Shortness of breath     with too much fluid  . GERD (gastroesophageal reflux disease)   . Sleep apnea     Does not use CPAP- due to weight loss  . Diabetes mellitus without complication     pt denies   Past Surgical History  Procedure Laterality Date  . Cholecystectomy    . Appendectomy    . Shoulder arthroscopy Right     w/ repair of rotator cuff repair  . Elbow tendon surgery Right   . Achilles tendon repair Right   . Cesarean section      X 2   . Hemorrhoid surgery      many years ago  . Av fistula placement Left 12/29/2012    Procedure: ARTERIOVENOUS (AV) FISTULA CREATION;  Surgeon: Sherren Kerns, MD;  Location: [facility] OR;  Service: Vascular;  Laterality: Left;  Creation Left Brachial Cephalic Arteriovenous Fistula  . Cardiac catheterization    . Laryngectomy      mass removed- noncancerous  . Back surgery      lower back   Family History  Problem Relation Age of Onset  . COPD Mother   . Multiple sclerosis Father   . Depression Brother     Suicide  . Heart disease Brother    History   Social History  . Marital Status: Married    Spouse Name: N/A    Number of Children: N/A  . Years of Education: N/A   Occupational History  . 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 Hair dresser    Social History Main Topics  . Smoking status: Current Every Day Smoker -- 1.00 packs/day for 40 years    Types: Cigarettes  . Smokeless tobacco: Never Used  . Alcohol Use: No  . Drug Use: No  . Sexual Activity: Not on file   Other Topics Concern    . Not on file   Social History Narrative   62 yo woman who lives at home with her husband of 44 years. She has a 40 pack year history of smoking, denies etoh or illicit drug use. 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     Review of Systems: Review of Systems  Constitutional: Positive for weight loss (40 lb in 6 weeks) and malaise/fatigue. Negative for fever, chills and diaphoresis.  HENT: Positive for congestion. Negative for sore throat.   Eyes: Positive for blurred vision (chronic).  Respiratory: Positive for shortness of breath (with overexertion). Negative for cough, sputum production and wheezing.   Cardiovascular: Positive for orthopnea, leg swelling (chronic ) and PND (occasionally). Negative for chest pain and palpitations.  Gastrointestinal: Positive for constipation (chronic). Negative for nausea, vomiting, abdominal pain, diarrhea, blood in stool and melena.  Genitourinary:       Oliguric  Musculoskeletal: Positive for falls.  Neurological: Positive for weakness (diffuse) and headaches (left sided posterior located). Negative for dizziness, tingling, tremors, sensory change, focal weakness and loss of consciousness.  Endo/Heme/Allergies: Bruises/bleeds easily.  Psychiatric/Behavioral: The patient has insomnia. The patient is not nervous/anxious.      Physical Exam: Blood pressure 125/56, pulse 98, temperature 97.7 F (36.5 C), temperature source Oral, resp. rate 16, height 5' 6.5" (1.689 m), weight 90.719 kg (200 lb), SpO2 100.00%. Physical Exam  Constitutional: She appears well-developed and well-nourished. No distress.  obese  HENT:  Mouth/Throat: Oropharynx is clear and moist. No oropharyngeal exudate.  Left occipital hematoma  Eyes: Conjunctivae and EOM are normal. Pupils are equal, round, and reactive to light.  Neck: Normal range of motion. Neck supple.   Cardiovascular: Normal rate.   Murmur heard. Irregular rhythm   Pulmonary/Chest: Effort normal. No respiratory distress. She has no wheezes.  Poor air movement, decreased breath sound at bases R>L  Abdominal: Soft. Bowel sounds are normal.  Musculoskeletal: She exhibits edema (+3 pitting edema to knees).  Skin: She is not diaphoretic.  Abrasion on left hand and right wrist.  Skin cold to touch Bilat AV access   Psychiatric: She has a normal mood and affect. Her behavior is normal.    Lab results: Basic Metabolic Panel:  Recent Labs  47/82/95 1008  NA 134*  K 3.3*  CL 89*  CO2 24  GLUCOSE 100*  BUN 37*  CREATININE 4.73*  CALCIUM 7.8*   Liver Function Tests:  Recent Labs  07/12/13 1008  AST 17  ALT 23  ALKPHOS 67  BILITOT 0.3  PROT 5.4*  ALBUMIN 2.2*   No results found for this basename: LIPASE, AMYLASE,  in the last 72 hours No results found for this basename: AMMONIA,  in the last 72 hours CBC:  Recent Labs  07/12/13 1008  WBC 7.3  NEUTROABS 5.1  HGB 12.3  HCT 35.4*  MCV 84.5  PLT 210   Cardiac Enzymes:  Recent Labs  07/12/13 1008  TROPONINI <0.30   BNP: No results found for this basename: PROBNP,  in the last 72 hours D-Dimer: No results found for this basename: DDIMER,  in the last 72 hours CBG: No results found for this basename: GLUCAP,  in the last 72 hours Hemoglobin A1C: No results found for this basename: HGBA1C,  in the last 72 hours Fasting Lipid Panel: No results found for this basename: CHOL, HDL, LDLCALC, TRIG, CHOLHDL, LDLDIRECT,  in the last 72 hours Thyroid Function Tests: No results found for this basename: TSH, T4TOTAL, FREET4, T3FREE, THYROIDAB,  in the last 72 hours Anemia Panel: No results found for this basename: VITAMINB12, FOLATE, FERRITIN, TIBC, IRON, RETICCTPCT,  in the last 72 hours Coagulation:  Recent Labs  07/12/13 1008  LABPROT 11.8  INR 0.88   Urine Drug Screen: Drugs of Abuse  No results found for  this basename: labopia, cocainscrnur, labbenz, amphetmu, thcu, labbarb    Alcohol Level: No results found for this basename: ETH,  in the last 72 hours Urinalysis: No results found for this basename: COLORURINE, APPERANCEUR, LABSPEC, PHURINE, GLUCOSEU, HGBUR, BILIRUBINUR, KETONESUR, PROTEINUR, UROBILINOGEN, NITRITE, LEUKOCYTESUR,  in the last 72 hours  Imaging results:  Ct Abdomen Pelvis Wo Contrast  07/12/2013   CLINICAL DATA:  Fall with head injury and altered level of consciousness.  EXAM: CT CHEST, ABDOMEN AND PELVIS WITHOUT CONTRAST  TECHNIQUE: Multidetector CT imaging of the chest, abdomen and pelvis was performed following the standard protocol without IV contrast.  COMPARISON:  Multiple exams, including 07/12/2013 and 03/21/2009.  FINDINGS: CT CHEST FINDINGS  2.0 cm partially calcified nodule the right thyroid lobe.  Coronary artery atherosclerosis with calcification of the aortic valve and mild cardiomegaly. Diffuse subcutaneous and mediastinal edema.  Moderate and likely loculated right pleural effusion with small left pleural effusion. Passive atelectasis noted. Suspected centrilobular emphysema.  CT ABDOMEN AND PELVIS FINDINGS  Diffuse subcutaneous edema with low grade mesenteric edema, all increased from prior. Trace fluid in the left paracolic gutter. Stable low-density left adrenal mass compatible with adenoma. Mild renal atrophy. Pancreas unremarkable in non-contrast CT appearance.  Aortoiliac atherosclerotic vascular disease noted with scattered small retroperitoneal lymph nodes. Sigmoid diverticulosis. No dilated bowel. Small amount of free pelvic fluid.  IMPRESSION: 1. Diffuse subcutaneous, mediastinal, and mesenteric edema compatible with 3rd spacing of fluid. 2. Moderate right and small left pleural effusions. The right pleural effusion is probably loculated. 3. Right-sided thyroid nodule. Consider further evaluation with thyroid ultrasound. If patient is clinically hyperthyroid,  consider nuclear medicine thyroid uptake and scan. 4. Atherosclerosis. 5. Stable left adrenal adenoma. 6. Small amount of ascites. Please note that today's exam was performed without IV contrast and accordingly does not have high sensitivity for solid organ laceration. 7. Sigmoid diverticulosis. 8. Centrilobular emphysema.   Electronically Signed   By: Herbie Baltimore M.D.   On: 07/12/2013 12:33   Dg Wrist Complete Left  07/12/2013   CLINICAL DATA:  Status post fall  EXAM: LEFT WRIST - COMPLETE 3+ VIEW  FINDINGS: There is no evidence of fracture or dislocation. There is no evidence of arthropathy or other focal bone abnormality. Soft tissues are unremarkable. The bones are osteopenic.  IMPRESSION: Osteopenia without evidence of acute osseous abnormalities.   Electronically Signed   By: Salome Holmes M.D.   On: 07/12/2013 11:53   Ct Head Wo Contrast  07/12/2013   CLINICAL DATA:  Fall  EXAM: CT HEAD WITHOUT CONTRAST  CT CERVICAL SPINE WITHOUT CONTRAST  TECHNIQUE: Multidetector CT imaging of the head and cervical spine was performed following the standard protocol without intravenous contrast. Multiplanar CT image reconstructions of the cervical spine were also generated.  COMPARISON:  None.  FINDINGS: CT HEAD FINDINGS  No skull fracture is noted. Paranasal sinuses and mastoid air cells are unremarkable. There is skull swelling and subcutaneous stranding in left occipital region. Subcutaneous hematoma in this region measures about 2.3 cm.  No intracranial hemorrhage, mass effect or midline shift. Atherosclerotic calcifications of carotid siphon are noted. Mild cerebral atrophy is noted. Tiny lacunar infarct is noted in right basal ganglia. No acute cortical infarction. No mass lesion is noted on this unenhanced scan.  CT CERVICAL SPINE FINDINGS  Axial images of the cervical spine shows no acute fracture or subluxation. Computer processed images shows no acute fracture or subluxation. Degenerative changes are  noted C1-C2 articulation. Mild disc space flattening with anterior spurring at C5-C6 level. No prevertebral soft tissue swelling. Cervical airway is patent. There is no pneumothorax in visualized left lung apex.  IMPRESSION: 1. No acute intracranial abnormality. There is scalp swelling and subcutaneous stranding in left occipital region. Subcutaneous hematoma in left occipital region measures 2.4 cm. Mild cerebral atrophy. 2. No cervical spine acute fracture or subluxation. Mild degenerative changes.   Electronically Signed   By: Natasha Mead M.D.   On: 07/12/2013 11:22   Ct Chest Wo Contrast  07/12/2013   CLINICAL DATA:  Fall with head injury and altered level of consciousness.  EXAM: CT CHEST, ABDOMEN AND  PELVIS WITHOUT CONTRAST  TECHNIQUE: Multidetector CT imaging of the chest, abdomen and pelvis was performed following the standard protocol without IV contrast.  COMPARISON:  Multiple exams, including 07/12/2013 and 03/21/2009.  FINDINGS: CT CHEST FINDINGS  2.0 cm partially calcified nodule the right thyroid lobe.  Coronary artery atherosclerosis with calcification of the aortic valve and mild cardiomegaly. Diffuse subcutaneous and mediastinal edema.  Moderate and likely loculated right pleural effusion with small left pleural effusion. Passive atelectasis noted. Suspected centrilobular emphysema.  CT ABDOMEN AND PELVIS FINDINGS  Diffuse subcutaneous edema with low grade mesenteric edema, all increased from prior. Trace fluid in the left paracolic gutter. Stable low-density left adrenal mass compatible with adenoma. Mild renal atrophy. Pancreas unremarkable in non-contrast CT appearance.  Aortoiliac atherosclerotic vascular disease noted with scattered small retroperitoneal lymph nodes. Sigmoid diverticulosis. No dilated bowel. Small amount of free pelvic fluid.  IMPRESSION: 1. Diffuse subcutaneous, mediastinal, and mesenteric edema compatible with 3rd spacing of fluid. 2. Moderate right and small left pleural  effusions. The right pleural effusion is probably loculated. 3. Right-sided thyroid nodule. Consider further evaluation with thyroid ultrasound. If patient is clinically hyperthyroid, consider nuclear medicine thyroid uptake and scan. 4. Atherosclerosis. 5. Stable left adrenal adenoma. 6. Small amount of ascites. Please note that today's exam was performed without IV contrast and accordingly does not have high sensitivity for solid organ laceration. 7. Sigmoid diverticulosis. 8. Centrilobular emphysema.   Electronically Signed   By: Herbie Baltimore M.D.   On: 07/12/2013 12:33   Ct Cervical Spine Wo Contrast  07/12/2013   CLINICAL DATA:  Fall  EXAM: CT HEAD WITHOUT CONTRAST  CT CERVICAL SPINE WITHOUT CONTRAST  TECHNIQUE: Multidetector CT imaging of the head and cervical spine was performed following the standard protocol without intravenous contrast. Multiplanar CT image reconstructions of the cervical spine were also generated.  COMPARISON:  None.  FINDINGS: CT HEAD FINDINGS  No skull fracture is noted. Paranasal sinuses and mastoid air cells are unremarkable. There is skull swelling and subcutaneous stranding in left occipital region. Subcutaneous hematoma in this region measures about 2.3 cm.  No intracranial hemorrhage, mass effect or midline shift. Atherosclerotic calcifications of carotid siphon are noted. Mild cerebral atrophy is noted. Tiny lacunar infarct is noted in right basal ganglia. No acute cortical infarction. No mass lesion is noted on this unenhanced scan.  CT CERVICAL SPINE FINDINGS  Axial images of the cervical spine shows no acute fracture or subluxation. Computer processed images shows no acute fracture or subluxation. Degenerative changes are noted C1-C2 articulation. Mild disc space flattening with anterior spurring at C5-C6 level. No prevertebral soft tissue swelling. Cervical airway is patent. There is no pneumothorax in visualized left lung apex.  IMPRESSION: 1. No acute intracranial  abnormality. There is scalp swelling and subcutaneous stranding in left occipital region. Subcutaneous hematoma in left occipital region measures 2.4 cm. Mild cerebral atrophy. 2. No cervical spine acute fracture or subluxation. Mild degenerative changes.   Electronically Signed   By: Natasha Mead M.D.   On: 07/12/2013 11:22   Dg Pelvis Portable  07/12/2013   CLINICAL DATA:  Fall, weakness and pain.  EXAM: PORTABLE PELVIS 1-2 VIEWS  COMPARISON:  CT abdomen and pelvis 03/21/2009.  FINDINGS: There is no evidence of pelvic fracture or diastasis. No other pelvic bone lesions are seen.  IMPRESSION: Negative exam.   Electronically Signed   By: Drusilla Kanner M.D.   On: 07/12/2013 11:09   Dg Chest Portable 1 View  07/12/2013  CLINICAL DATA:  Status post fall now with weakness history of dialysis dependent renal failure, and diabetes.  EXAM: PORTABLE CHEST - 1 VIEW  COMPARISON:  Dec 28, 2012.  FINDINGS: There is new blunting of the right lateral costophrenic angle. There is no pneumothorax. The observed portions of the overlying ribs appear normal. The left lung is well expanded and clear. The cardiac silhouette is mildly enlarged though stable. The pulmonary vascularity is somewhat indistinct on the right.  IMPRESSION: There is a new right pleural effusion. This may be secondary to the patient's underlying cardiac and renal disease or reflect post traumatic processes. As best as can be determined the right ribs are intact. Followup chest CT scanning may be useful if the patient has experienced significant thoracic trauma.   Electronically Signed   By: David  [place]   On: 07/12/2013 11:07   Dg Humerus Left  07/12/2013   CLINICAL DATA:  Status post fall  EXAM: LEFT HUMERUS - 2+ VIEW  COMPARISON:  None.  FINDINGS: There is no evidence of fracture or other focal bone lesions. Soft tissues are unremarkable.  IMPRESSION: Negative.   Electronically Signed   By: Salome Holmes M.D.   On: 07/12/2013 11:54   Dg Hand  Complete Left  07/12/2013   CLINICAL DATA:  Status post fall  EXAM: LEFT HAND - COMPLETE 3+ VIEW  COMPARISON:  None.  FINDINGS: The bones are osteopenic. Mild osteoarthritic changes identified within the proximal and distal interphalangeal joints. There is no evidence of acute fracture nor dislocation.  IMPRESSION: 1. Mild osteoarthritic changes. 2. No evidence of acute osseous abnormalities.   Electronically Signed   By: Salome Holmes M.D.   On: 07/12/2013 11:48    Other results: EKG  Ventricular Rate: 129  PR Interval:  QRS Duration: 100  QT Interval: 317  QTC Calculation: 464  R Axis: 32  Text Interpretation: Atrial fibrillation Borderline low voltage, extremity leads Repol abnrm suggests ischemia, diffuse leads atrial Confirmed    Assessment & Plan by Problem: Principal Problem:   Dyspnea Active Problems:   Accidental fall on or from sidewalk curb   Assessment:   62 year old female with past medical history of ESRD on HD (TTS) since 03/2013, HTN, HL, T2DM, COPD, diastolic CHF, OSA, and GERD who presented on 11/25 with recent fall and found to be in atrial fibrillation with RVR and  hypoxic with right loculated pleural effusion.      Atrial fibrillation with RVR -  Currently rate controlled. Pt with 1st episode of Afib with RVR on 12-lead EKG not present from 12/29/12. Etiology unknown. Due to symptoms of thyroid dysfunction (cold, constipation, wt change, dry skin, dysphagia) and presence of 2.0 cm partially calcified right sided thyroid nodule, thyroid disease possible. Pt also with HTN,  grade II diastolic HF, aortic stenosis, and OSA and hypoxia on admission.  No reports of alcohol or drug use. Pt received 5 mg IV diltiazem for rate control. HR 90-118. If paroxysmal should self-terminate in <48 hr.  -Monitor on telemetry  -Obtain TSH, free T4 -Consider metoprolol or diltiazem if HR> 110 at rest   -Start IV heparin for [facility]  -If AF>48hr consider cardioconversion   Hypoxia in  setting of right loculated pleural effusion - Pt not on home oxygen for COPD. Due to low SpO2 pt requiring supplemental oxygen (4L) with SpO2 95-100%. Moderate and likely loculated right pleural effusion with small left. Pt with diastolic heart failure without atypical features therefore diagnostic thoracentesis probably not  required. Pt with volume overload on exam on HD due to ESRD.   -Oxygen therapy to keep SpO2>92%   -HD today for volume removal  End Stage Renal Disease on HD -  Pt on HD since August of this year.  Pt is oliguric (occasional leaks). Pt with recent problem with clotting of fistula. She follows with ESRD on dialysis at the [facility] on TTS schedule. She reports chronic LE edema and on diuretic therapy without improvement. -Monitor weight  -Daily I& Os -Renal diet  -Continue Phoslo, calcitrol  -HD today on TTS schedule  Mechanical Fall with head injury - CT head negative for acute intracranial abnormality but evidence of subcutaneous hematoma (2.3 cm) in left occipital region. CT cervical spine without acute fracture or subluxation. XR left hand, humerus, and wrist  without evidence of  fracture or other focal bone lesions.  -Continue home norco Q6 PRN pain   Thyroid Nodule - Pt with evidence of 2.0 cm partially calcified nodule the right thyroid lobe with past history of benign thyroid mass removal. -Obtain TSH, free T4  Hypertension - Pt on home atenolol, clonidine, hydralazine and lisinopril. BP range 86/51- 132/66. -Hold atenolol 50 mg BID  -Hold lisinopril 20 mg BID -Hold hydralazine 25 mg BID -Continue home clonidine 0.2 mg BID  COPD - Pt reports last PFT many years ago (no records on file). Pt with evidence of centrilobular emphysema on CT imaging. No history of intubation, recent exacerbation or steroids/antiobitcs. Pt reports she does not use any inhalers at home. No evidence of wheezing on exam and denies dyspnea, cough, and chest tightness.     -Albuterol PRN bronchospasm -Outpatient PFT  Grade II Diastolic Heart Failure - Pt with last 2D-Echo on 12/03/12 with evidence of Grade 2 diastolic dysfunction with normal EF (65-70%) and no wall motion abnormalities. Also with mild to moderate aortic stenosis (possibly bicuspid). Pt reports she is oliguric and compliant with diuretic therapy. -Continue home PO 80 mg lasix   Complicated Diabetes Mellitus - No record of HbA1c on file. Complicated with peripheral neuropathy and nephropathy with progression to ESRD.   -Obtain HbA1c -Monitor glucose   Insomnia - Pt reports insomnia at home.  -Continue home zolpidem 5 mg daily    GERD - Pt on home omeprazole 20 mg daily.  -Continue protonix 40 mg daily     Constipation - Pt reports constipation for past several month. She is on miralax at home. -Continue home miralax  OSA - Pt with past use of CPAP at home but due to weight loss no longer uses it at night.  -CPAP at night as needed  Code: Full  Diet: Renal  DVT Ppx: Heparin  Dispo: Disposition is deferred at this time, awaiting improvement of current medical problems. Anticipated discharge in approximately 1-2 day(s).   The patient does have a current PCP Milana Obey, MD) and does need an [facility] hospital follow-up appointment after discharge.  The patient does not have transportation limitations that hinder transportation to clinic appointments.  Signed: Otis Brace, MD 07/12/2013, 5:07 PM

## 2013-07-12 NOTE — Progress Notes (Signed)
ANTICOAGULATION CONSULT NOTE - Initial Consult  Pharmacy Consult for Heparin Indication: atrial fibrillation  Allergies  Allergen Reactions  . Doxycycline Nausea And Vomiting  . Lipitor [Atorvastatin] Nausea And Vomiting    Patient Measurements: Height: 5' 6.5" (168.9 cm) Weight: 208 lb 8.9 oz (94.6 kg) IBW/kg (Calculated) : 60.45 Heparin Dosing Weight: 83 kg  Vital Signs: Temp: 97.5 F (36.4 C) (11/25 1715) Temp src: Oral (11/25 1715) BP: 98/70 mmHg (11/25 1926) Pulse Rate: 110 (11/25 1926)  Labs:  Recent Labs  07/12/13 1008  HGB 12.3  HCT 35.4*  PLT 210  LABPROT 11.8  INR 0.88  CREATININE 4.73*  TROPONINI <0.30    Estimated Creatinine Clearance: 14.2 ml/min (by C-G formula based on Cr of 4.73).   Medical History: Past Medical History  Diagnosis Date  . Peripheral neuropathy   . Heart murmur   . ESRD (end stage renal disease)     Chronic Renal Insufficiency  . Diabetic nephropathy   . Proteinuria     Progression  . Hypertension   . Anemia   . Dyslipidemia   . Laryngeal mass   . COPD (chronic obstructive pulmonary disease)   . CHF (congestive heart failure)   . PONV (postoperative nausea and vomiting)   . Shortness of breath     with too much fluid  . GERD (gastroesophageal reflux disease)   . Sleep apnea     Does not use CPAP- due to weight loss  . Diabetes mellitus without complication     pt denies    Assessment: 62 year old female with ESRD admitted s/p fall at home found to have right pleural effusion with symptomatic dyspnea and new onset Afib.  Pharmacy asked to begin IV heparin.  Received some heparin in HD this evening.  Goal of Therapy:  Heparin level 0.3-0.7 units/ml Monitor platelets by anticoagulation protocol: Yes   Plan:  1) Heparin bolus 2500 units iv x 1 2) Heparin drip at 1200 units / hr 3) Daily heparin level, CBC  Thank you. Okey Regal, PharmD [phone]   07/12/2013,7:37 PM

## 2013-07-12 NOTE — ED Notes (Signed)
Report called to [facility]. Informed by nurse that room is not ready & will call back when it's clean.

## 2013-07-13 ENCOUNTER — Encounter (HOSPITAL_COMMUNITY): Payer: Self-pay | Admitting: Cardiology

## 2013-07-13 DIAGNOSIS — I2510 Atherosclerotic heart disease of native coronary artery without angina pectoris: Secondary | ICD-10-CM

## 2013-07-13 DIAGNOSIS — E1122 Type 2 diabetes mellitus with diabetic chronic kidney disease: Secondary | ICD-10-CM | POA: Diagnosis present

## 2013-07-13 DIAGNOSIS — I700 Atherosclerosis of aorta: Secondary | ICD-10-CM | POA: Insufficient documentation

## 2013-07-13 DIAGNOSIS — I5032 Chronic diastolic (congestive) heart failure: Secondary | ICD-10-CM | POA: Diagnosis present

## 2013-07-13 DIAGNOSIS — E1340 Other specified diabetes mellitus with diabetic neuropathy, unspecified: Secondary | ICD-10-CM | POA: Insufficient documentation

## 2013-07-13 DIAGNOSIS — N186 End stage renal disease: Secondary | ICD-10-CM

## 2013-07-13 DIAGNOSIS — E1121 Type 2 diabetes mellitus with diabetic nephropathy: Secondary | ICD-10-CM | POA: Insufficient documentation

## 2013-07-13 DIAGNOSIS — E43 Unspecified severe protein-calorie malnutrition: Secondary | ICD-10-CM | POA: Diagnosis present

## 2013-07-13 DIAGNOSIS — R531 Weakness: Secondary | ICD-10-CM | POA: Diagnosis present

## 2013-07-13 DIAGNOSIS — Z7901 Long term (current) use of anticoagulants: Secondary | ICD-10-CM

## 2013-07-13 DIAGNOSIS — I25119 Atherosclerotic heart disease of native coronary artery with unspecified angina pectoris: Secondary | ICD-10-CM | POA: Insufficient documentation

## 2013-07-13 DIAGNOSIS — E13319 Other specified diabetes mellitus with unspecified diabetic retinopathy without macular edema: Secondary | ICD-10-CM | POA: Insufficient documentation

## 2013-07-13 HISTORY — DX: Atherosclerotic heart disease of native coronary artery without angina pectoris: I25.10

## 2013-07-13 LAB — HEPARIN LEVEL (UNFRACTIONATED): Heparin Unfractionated: 0.21 IU/mL — ABNORMAL LOW (ref 0.30–0.70)

## 2013-07-13 LAB — CK: Total CK: 48 U/L (ref 7–177)

## 2013-07-13 LAB — SEDIMENTATION RATE: Sed Rate: 5 mm/hr (ref 0–22)

## 2013-07-13 LAB — TSH: TSH: 4.126 u[IU]/mL (ref 0.350–4.500)

## 2013-07-13 LAB — CBC
HCT: 31.3 % — ABNORMAL LOW (ref 36.0–46.0)
Hemoglobin: 10 g/dL — ABNORMAL LOW (ref 12.0–15.0)
MCH: 29.4 pg (ref 26.0–34.0)
MCH: 29.9 pg (ref 26.0–34.0)
MCV: 84.4 fL (ref 78.0–100.0)
MCV: 84.5 fL (ref 78.0–100.0)
Platelets: 77 10*3/uL — ABNORMAL LOW (ref 150–400)
Platelets: 160 10*3/uL (ref 150–400)
RBC: 3.71 MIL/uL — ABNORMAL LOW (ref 3.87–5.11)
RBC: 3.35 MIL/uL — ABNORMAL LOW (ref 3.87–5.11)
RDW: 17.2 % — ABNORMAL HIGH (ref 11.5–15.5)
RDW: 17.3 % — ABNORMAL HIGH (ref 11.5–15.5)

## 2013-07-13 MED ORDER — WARFARIN - PHARMACIST DOSING INPATIENT
Freq: Every day | Status: DC
  Administered 2013-07-14: 18:00:00

## 2013-07-13 MED ORDER — COUMADIN BOOK
Freq: Once | Status: AC
  Administered 2013-07-13: 14:00:00
  Filled 2013-07-13: qty 1

## 2013-07-13 MED ORDER — WARFARIN SODIUM 5 MG PO TABS
5.0000 mg | ORAL_TABLET | Freq: Once | ORAL | Status: AC
  Administered 2013-07-13: 5 mg via ORAL
  Filled 2013-07-13: qty 1

## 2013-07-13 MED ORDER — ALBUMIN HUMAN 25 % IV SOLN: 25.0000 g | Freq: Once | INTRAVENOUS | Status: DC

## 2013-07-13 MED ORDER — SODIUM CHLORIDE 0.9 % IV BOLUS (SEPSIS)
250.0000 mL | Freq: Once | INTRAVENOUS | Status: AC
  Administered 2013-07-13: 250 mL via INTRAVENOUS

## 2013-07-13 MED ORDER — NEPRO/CARBSTEADY PO LIQD
237.0000 mL | Freq: Two times a day (BID) | ORAL | Status: DC
  Administered 2013-07-14 – 2013-07-18 (×5): 237 mL via ORAL
  Filled 2013-07-13: qty 237

## 2013-07-13 MED ORDER — WARFARIN VIDEO: Freq: Once | Status: DC

## 2013-07-13 MED ORDER — RENA-VITE PO TABS
1.0000 | ORAL_TABLET | Freq: Every day | ORAL | Status: DC
  Administered 2013-07-13 – 2013-07-15 (×3): 1 via ORAL
  Administered 2013-07-17: 1 mg via ORAL
  Administered 2013-07-17 (×2): 1 via ORAL
  Filled 2013-07-13 (×8): qty 1

## 2013-07-13 MED ORDER — ALBUMIN HUMAN 25 % IV SOLN
INTRAVENOUS | Status: AC
  Administered 2013-07-13: 25 g
  Filled 2013-07-13: qty 100

## 2013-07-13 MED ORDER — POLYETHYLENE GLYCOL 3350 17 G PO PACK
17.0000 g | PACK | Freq: Every day | ORAL | Status: DC
  Administered 2013-07-15: 17 g via ORAL
  Filled 2013-07-13 (×6): qty 1

## 2013-07-13 MED ORDER — METOPROLOL TARTRATE 25 MG PO TABS
25.0000 mg | ORAL_TABLET | Freq: Two times a day (BID) | ORAL | Status: DC
  Administered 2013-07-13 – 2013-07-18 (×10): 25 mg via ORAL
  Filled 2013-07-13 (×13): qty 1

## 2013-07-13 MED ORDER — HYDROCODONE-ACETAMINOPHEN 5-325 MG PO TABS
1.0000 | ORAL_TABLET | Freq: Four times a day (QID) | ORAL | Status: DC | PRN
  Administered 2013-07-13 – 2013-07-18 (×12): 1 via ORAL
  Filled 2013-07-13 (×11): qty 1

## 2013-07-13 NOTE — Progress Notes (Signed)
I agree with medical student, MS4, Deneen Harts note, please see my progress note.

## 2013-07-13 NOTE — Progress Notes (Signed)
Assessment:  1 ESRD w/ IDLHypotension 2 Volume overload  3 s/p Fall 4 New onset Afib  5 Generalized weakness/FTT  6 Acute thrombocytopenia v lab error Plan:  1 Hemodialysis again and give albumin for support. Will recheck PLT ct. .   Suggest a echo to rule out pericardial disease    Subjective: Interval History: Only about 2000cc off with HD yesterday due to intradialytic hypotension.  Objective: Vital signs in last 24 hours: Temp:  [97.5 F (36.4 C)-97.8 F (36.6 C)] 97.6 F (36.4 C) (11/26 0730) Pulse Rate:  [28-143] 118 (11/26 0930) Resp:  [10-23] 23 (11/26 0830) BP: (64-132)/(32-88) 131/72 mmHg (11/26 0930) SpO2:  [87 %-100 %] 98 % (11/26 0930) Weight:  [88.8 kg (195 lb 12.3 oz)-94.6 kg (208 lb 8.9 oz)] 89.2 kg (196 lb 10.4 oz) (11/26 0441) Weight change:   Intake/Output from previous day: 11/25 0701 - 11/26 0700 In: 515 [I.V.:515] Out: 1982  Intake/Output this shift: Total I/O In: 24 [I.V.:24] Out: -   General appearance: alert and cooperative Resp: diminished breath sounds RLL Cardio: irregularly irregular rhythm GI: soft, non-tender; bowel sounds normal; no masses,  no organomegaly Extremities: edema 2+  Lab Results:  Recent Labs  07/12/13 1008 07/13/13 0730  WBC 7.3 6.8  HGB 12.3 10.9*  HCT 35.4* 31.3*  PLT 210 77*   BMET:  Recent Labs  07/12/13 1008  NA 134*  K 3.3*  CL 89*  CO2 24  GLUCOSE 100*  BUN 37*  CREATININE 4.73*  CALCIUM 7.8*   No results found for this basename: PTH,  in the last 72 hours Iron Studies: No results found for this basename: IRON, TIBC, TRANSFERRIN, FERRITIN,  in the last 72 hours Studies/Results: Ct Abdomen Pelvis Wo Contrast  07/12/2013   CLINICAL DATA:  Fall with head injury and altered level of consciousness.  EXAM: CT CHEST, ABDOMEN AND PELVIS WITHOUT CONTRAST  TECHNIQUE: Multidetector CT imaging of the chest, abdomen and pelvis was performed following the standard protocol without IV contrast.  COMPARISON:   Multiple exams, including 07/12/2013 and 03/21/2009.  FINDINGS: CT CHEST FINDINGS  2.0 cm partially calcified nodule the right thyroid lobe.  Coronary artery atherosclerosis with calcification of the aortic valve and mild cardiomegaly. Diffuse subcutaneous and mediastinal edema.  Moderate and likely loculated right pleural effusion with small left pleural effusion. Passive atelectasis noted. Suspected centrilobular emphysema.  CT ABDOMEN AND PELVIS FINDINGS  Diffuse subcutaneous edema with low grade mesenteric edema, all increased from prior. Trace fluid in the left paracolic gutter. Stable low-density left adrenal mass compatible with adenoma. Mild renal atrophy. Pancreas unremarkable in non-contrast CT appearance.  Aortoiliac atherosclerotic vascular disease noted with scattered small retroperitoneal lymph nodes. Sigmoid diverticulosis. No dilated bowel. Small amount of free pelvic fluid.  IMPRESSION: 1. Diffuse subcutaneous, mediastinal, and mesenteric edema compatible with 3rd spacing of fluid. 2. Moderate right and small left pleural effusions. The right pleural effusion is probably loculated. 3. Right-sided thyroid nodule. Consider further evaluation with thyroid ultrasound. If patient is clinically hyperthyroid, consider nuclear medicine thyroid uptake and scan. 4. Atherosclerosis. 5. Stable left adrenal adenoma. 6. Small amount of ascites. Please note that today's exam was performed without IV contrast and accordingly does not have high sensitivity for solid organ laceration. 7. Sigmoid diverticulosis. 8. Centrilobular emphysema.   Electronically Signed   By: Herbie Baltimore M.D.   On: 07/12/2013 12:33   Dg Wrist Complete Left  07/12/2013   CLINICAL DATA:  Status post fall  EXAM: LEFT WRIST -  COMPLETE 3+ VIEW  FINDINGS: There is no evidence of fracture or dislocation. There is no evidence of arthropathy or other focal bone abnormality. Soft tissues are unremarkable. The bones are osteopenic.   IMPRESSION: Osteopenia without evidence of acute osseous abnormalities.   Electronically Signed   By: Salome Holmes M.D.   On: 07/12/2013 11:53   Ct Head Wo Contrast  07/12/2013   CLINICAL DATA:  Fall  EXAM: CT HEAD WITHOUT CONTRAST  CT CERVICAL SPINE WITHOUT CONTRAST  TECHNIQUE: Multidetector CT imaging of the head and cervical spine was performed following the standard protocol without intravenous contrast. Multiplanar CT image reconstructions of the cervical spine were also generated.  COMPARISON:  None.  FINDINGS: CT HEAD FINDINGS  No skull fracture is noted. Paranasal sinuses and mastoid air cells are unremarkable. There is skull swelling and subcutaneous stranding in left occipital region. Subcutaneous hematoma in this region measures about 2.3 cm.  No intracranial hemorrhage, mass effect or midline shift. Atherosclerotic calcifications of carotid siphon are noted. Mild cerebral atrophy is noted. Tiny lacunar infarct is noted in right basal ganglia. No acute cortical infarction. No mass lesion is noted on this unenhanced scan.  CT CERVICAL SPINE FINDINGS  Axial images of the cervical spine shows no acute fracture or subluxation. Computer processed images shows no acute fracture or subluxation. Degenerative changes are noted C1-C2 articulation. Mild disc space flattening with anterior spurring at C5-C6 level. No prevertebral soft tissue swelling. Cervical airway is patent. There is no pneumothorax in visualized left lung apex.  IMPRESSION: 1. No acute intracranial abnormality. There is scalp swelling and subcutaneous stranding in left occipital region. Subcutaneous hematoma in left occipital region measures 2.4 cm. Mild cerebral atrophy. 2. No cervical spine acute fracture or subluxation. Mild degenerative changes.   Electronically Signed   By: Natasha Mead M.D.   On: 07/12/2013 11:22   Ct Chest Wo Contrast  07/12/2013   CLINICAL DATA:  Fall with head injury and altered level of consciousness.  EXAM: CT  CHEST, ABDOMEN AND PELVIS WITHOUT CONTRAST  TECHNIQUE: Multidetector CT imaging of the chest, abdomen and pelvis was performed following the standard protocol without IV contrast.  COMPARISON:  Multiple exams, including 07/12/2013 and 03/21/2009.  FINDINGS: CT CHEST FINDINGS  2.0 cm partially calcified nodule the right thyroid lobe.  Coronary artery atherosclerosis with calcification of the aortic valve and mild cardiomegaly. Diffuse subcutaneous and mediastinal edema.  Moderate and likely loculated right pleural effusion with small left pleural effusion. Passive atelectasis noted. Suspected centrilobular emphysema.  CT ABDOMEN AND PELVIS FINDINGS  Diffuse subcutaneous edema with low grade mesenteric edema, all increased from prior. Trace fluid in the left paracolic gutter. Stable low-density left adrenal mass compatible with adenoma. Mild renal atrophy. Pancreas unremarkable in non-contrast CT appearance.  Aortoiliac atherosclerotic vascular disease noted with scattered small retroperitoneal lymph nodes. Sigmoid diverticulosis. No dilated bowel. Small amount of free pelvic fluid.  IMPRESSION: 1. Diffuse subcutaneous, mediastinal, and mesenteric edema compatible with 3rd spacing of fluid. 2. Moderate right and small left pleural effusions. The right pleural effusion is probably loculated. 3. Right-sided thyroid nodule. Consider further evaluation with thyroid ultrasound. If patient is clinically hyperthyroid, consider nuclear medicine thyroid uptake and scan. 4. Atherosclerosis. 5. Stable left adrenal adenoma. 6. Small amount of ascites. Please note that today's exam was performed without IV contrast and accordingly does not have high sensitivity for solid organ laceration. 7. Sigmoid diverticulosis. 8. Centrilobular emphysema.   Electronically Signed   By: Herbie Baltimore M.D.  On: 07/12/2013 12:33   Ct Cervical Spine Wo Contrast  07/12/2013   CLINICAL DATA:  Fall  EXAM: CT HEAD WITHOUT CONTRAST  CT CERVICAL  SPINE WITHOUT CONTRAST  TECHNIQUE: Multidetector CT imaging of the head and cervical spine was performed following the standard protocol without intravenous contrast. Multiplanar CT image reconstructions of the cervical spine were also generated.  COMPARISON:  None.  FINDINGS: CT HEAD FINDINGS  No skull fracture is noted. Paranasal sinuses and mastoid air cells are unremarkable. There is skull swelling and subcutaneous stranding in left occipital region. Subcutaneous hematoma in this region measures about 2.3 cm.  No intracranial hemorrhage, mass effect or midline shift. Atherosclerotic calcifications of carotid siphon are noted. Mild cerebral atrophy is noted. Tiny lacunar infarct is noted in right basal ganglia. No acute cortical infarction. No mass lesion is noted on this unenhanced scan.  CT CERVICAL SPINE FINDINGS  Axial images of the cervical spine shows no acute fracture or subluxation. Computer processed images shows no acute fracture or subluxation. Degenerative changes are noted C1-C2 articulation. Mild disc space flattening with anterior spurring at C5-C6 level. No prevertebral soft tissue swelling. Cervical airway is patent. There is no pneumothorax in visualized left lung apex.  IMPRESSION: 1. No acute intracranial abnormality. There is scalp swelling and subcutaneous stranding in left occipital region. Subcutaneous hematoma in left occipital region measures 2.4 cm. Mild cerebral atrophy. 2. No cervical spine acute fracture or subluxation. Mild degenerative changes.   Electronically Signed   By: Natasha Mead M.D.   On: 07/12/2013 11:22   Dg Pelvis Portable  07/12/2013   CLINICAL DATA:  Fall, weakness and pain.  EXAM: PORTABLE PELVIS 1-2 VIEWS  COMPARISON:  CT abdomen and pelvis 03/21/2009.  FINDINGS: There is no evidence of pelvic fracture or diastasis. No other pelvic bone lesions are seen.  IMPRESSION: Negative exam.   Electronically Signed   By: Drusilla Kanner M.D.   On: 07/12/2013 11:09   Dg  Chest Portable 1 View  07/12/2013   CLINICAL DATA:  Status post fall now with weakness history of dialysis dependent renal failure, and diabetes.  EXAM: PORTABLE CHEST - 1 VIEW  COMPARISON:  Dec 28, 2012.  FINDINGS: There is new blunting of the right lateral costophrenic angle. There is no pneumothorax. The observed portions of the overlying ribs appear normal. The left lung is well expanded and clear. The cardiac silhouette is mildly enlarged though stable. The pulmonary vascularity is somewhat indistinct on the right.  IMPRESSION: There is a new right pleural effusion. This may be secondary to the patient's underlying cardiac and renal disease or reflect post traumatic processes. As best as can be determined the right ribs are intact. Followup chest CT scanning may be useful if the patient has experienced significant thoracic trauma.   Electronically Signed   By: David  [place]   On: 07/12/2013 11:07   Dg Humerus Left  07/12/2013   CLINICAL DATA:  Status post fall  EXAM: LEFT HUMERUS - 2+ VIEW  COMPARISON:  None.  FINDINGS: There is no evidence of fracture or other focal bone lesions. Soft tissues are unremarkable.  IMPRESSION: Negative.   Electronically Signed   By: Salome Holmes M.D.   On: 07/12/2013 11:54   Dg Hand Complete Left  07/12/2013   CLINICAL DATA:  Status post fall  EXAM: LEFT HAND - COMPLETE 3+ VIEW  COMPARISON:  None.  FINDINGS: The bones are osteopenic. Mild osteoarthritic changes identified within the proximal and distal interphalangeal joints.  There is no evidence of acute fracture nor dislocation.  IMPRESSION: 1. Mild osteoarthritic changes. 2. No evidence of acute osseous abnormalities.   Electronically Signed   By: Salome Holmes M.D.   On: 07/12/2013 11:48   Scheduled: . calcitRIOL  0.25 mcg Oral Q M,W,F  . calcium acetate  667 mg Oral TID WC  . furosemide  80 mg Oral BID  . pantoprazole  40 mg Oral Daily  . polyethylene glycol powder  17 g Oral Daily  . sodium chloride  3  mL Intravenous Q12H  . zolpidem  5 mg Oral QHS    LOS: 1 day   Gillie Crisci C 07/13/2013,10:07 AM

## 2013-07-13 NOTE — H&P (Signed)
I agree with medical student, Deneen Harts note H & P, please see my H &P.

## 2013-07-13 NOTE — Progress Notes (Signed)
ANTICOAGULATION CONSULT NOTE - Follow Up Consult  Pharmacy Consult for coumadin Indication: atrial fibrillation  Allergies  Allergen Reactions  . Doxycycline Nausea And Vomiting  . Lipitor [Atorvastatin] Nausea And Vomiting    Patient Measurements: Height: 5' 6.5" (168.9 cm) Weight: 205 lb 14.6 oz (93.4 kg) IBW/kg (Calculated) : 60.45  Vital Signs: Temp: 97.9 F (36.6 C) (11/26 1227) Temp src: Oral (11/26 1227) BP: 110/71 mmHg (11/26 1336) Pulse Rate: 103 (11/26 1336)  Labs:  Recent Labs  07/12/13 1008 07/13/13 0730 07/13/13 1030 07/13/13 1200 07/13/13 1315  HGB 12.3 10.9*  --   --  10.0*  HCT 35.4* 31.3*  --   --  28.3*  PLT 210 77*  --   --  160  LABPROT 11.8  --   --   --   --   INR 0.88  --   --   --   --   HEPARINUNFRC  --   --   --  0.21*  --   CREATININE 4.73*  --   --   --   --   CKTOTAL  --   --  48  --   --   TROPONINI <0.30  --   --   --   --     Estimated Creatinine Clearance: 14.2 ml/min (by C-G formula based on Cr of 4.73).   Medications:  Scheduled:  . albumin human  25 g Intravenous Once  . albumin human      . calcitRIOL  0.25 mcg Oral Q M,W,F  . calcium acetate  667 mg Oral TID WC  . furosemide  80 mg Oral BID  . metoprolol tartrate  25 mg Oral BID  . pantoprazole  40 mg Oral Daily  . polyethylene glycol  17 g Oral Daily  . sodium chloride  3 mL Intravenous Q12H  . zolpidem  5 mg Oral QHS    Assessment: 62 yo female with afib to start coumadin. Baseline INR= 0.88. Platelet count of 77 noted this am and last platelet count was 210 on 07/12/13. A repeat platelet count today was 160 (prior lab was likely error).  CHADS-VASC score= 4 and patient to begin coumadin with no heparin due to prior concern of thrombocytopenia.  Goal of Therapy:  INR 2-3 Monitor platelets by anticoagulation protocol: Yes   Plan:  -Coumadin 5mg  po today -Daily PT/INR -Will begin education process  Harland German, Ilda Basset D 07/13/2013 1:57 PM

## 2013-07-13 NOTE — Progress Notes (Signed)
Internal Medicine Teaching Service Night Float Progress Note  S: Called by nursing for low BP.  Patient underwent HD earlier today and had low BP, in dialysis she was given 200cc bolus and 25g of albumin.  2L were taken off in HD.  Weight was documented to drop from 208 to 195 post dialysis (doubt. Tonight nursing reports a BP reading of 66/28, cuff was repositioned and read 83/43.  Patient reports she feels well but is tired and would like to go back to sleep.    O: BP 86/47 HR 89-112 (Irregular)  O2 Sat 93% on 2L [facility] Gen: Resting in bed comfortably Cardio: Irregularly irregular. Pulm: B/L wheezing Abd: soft nontender  A:  Hypotension  P:  - 250cc NS bolus over 3 hours. - Continue to monitor BP - Will hold off on breathing treatment at this time as patient wishes to sleep and is not in respiratory distress and has tachycardia.  Carlynn Purl, DO

## 2013-07-13 NOTE — H&P (Signed)
Date: 07/13/2013  Patient name: [name]  Medical record number: [id number]  Date of birth: July 02, 1951   I have seen and evaluated Anita [name] and discussed their care with the Residency Team.  Anita [name] is a 62yo woman with PMH of ESRD on HD, HTN, DM2, COPD, diastolic CHF and GERD who presented for weakness and a fall.  She noted that she was climbing a step and lost her grip on the handrail resulting in a fall backward and hit to the head, no LOC.  Further symptoms include weakness since starting HD and persistent LE edema along with orthopnea and occasional PAD.  Further has constipation, dry skin and decreased appetite along with a sensation of dysphagia and bolus sensation.  She has lost 70 pounds per patient since May, about 40 pounds documented in our system.  Her weakness consists of difficulty getting up from a chair, difficulty raising things over her head and difficulty rolling over in bed. This has been occuring for about 1 month.  She is a smoker.  On CXR she was found to have a right sided pleural effusion, which was also noted on CT scan.  She was noted to be in Atrial fibrillation with intermittent RVR since being admitted.  She has a history of diastolic CHF.  Assessment and Plan: I have seen and evaluated the patient as outlined above. I agree with the formulated Assessment and Plan as detailed in the residents' admission note, with the following changes:   1. Afib with RVR - Unclear if this is new onset or paroxysmal.  The EKGs we have in our system from May reveal sinus bradycardia.  Currently, she is ranging from the 90s-120s on telemetry - She was initiated on heparin therapy IV - CBC will be monitored while on therapy - Consider Cardiology consult for long term plan in this complicated woman.  She will likely need long term anticoagulation and discussion regarding cardioversion.  - Afib with poor cardiac output could explain why she has been having worsening of her  edema despite starting dialysis (she has pleural effusion, LE edema not responsive to dialysis and what appears to be bowel edema on CT scan).  - Monitor on Telemetry - She is on atenolol at home - Recheck TTE to evaluate for worsening function given Afib, ? Need for metoprolol - Check thyroid studies  2. Volume overload with loculated pleural effusion and hypoxia - Hypoxia has improved - Nephrology consulted for HD - If pleural effusion not improving with HD removal of fluid, consider thoracentesis - ? Poor cardiac output as noted above, will get TTE  3. Grade 2 d HF - TTE in 11/2012 reveal this, patient states she has had very poorly controlled HTN for "a long time," which has now converted to hypotension in the setting of HD - Get TTE for further evaluation - Consider Cardiology consult  4. Mechanical fall, weakness - She appears to have mostly proximal muscle weakness, given her symptoms.  She also reports swallowing issues and weight loss.  Symptoms could be related to poor cardiac function and dialysis, but also concerning for possible polymyositis vs. MG vs. ALS.  Will get CK and ESR for baseline. TSH as well.  Further imaging/procedures based on findings.   5. Wt loss - As noted above, could be explained by bowel edema and poor cardiac output, but also concerning for malignancy given weakness (? Paraneoplastic syndrome) and dysphagia with decreased PO intake.  She does not appear to  have had age appropriate screening.  Will need to assess for recent mammogram/colonoscopy.  Consider hemocult. Check TSH.  6. H/O HTN - Now with hypotension - Would hold home anti-HTN, but have low threshold to add back a beta blocker given elevated HR  Other issues per resident note.   Anita Catalina, MD 11/26/201410:25 AM

## 2013-07-13 NOTE — Progress Notes (Signed)
UR completed.  Patient changed to inpatient status r/t new onset A-fib

## 2013-07-13 NOTE — Consult Note (Signed)
Cardiology Consult Note  Admit date: 07/12/2013 Name: Anita [name] 62 y.o.  female DOB:  [birth year] MRN:  [id number]  Today's date:  07/13/2013  Referring Physician:    [facility]  Primary Physician:   Dr. John Giovanni  Reason for Consultation:    Atrial fibrillation, significant edema  IMPRESSIONS: 1. Atrial fibrillation of unknown duration of onset. The duration of the onset of atrial fibrillation is unclear but she has had difficulty with dialysis and also has extensive evidence of diastolic dysfunction. 2. Chronic diastolic heart failure likely worsened with atrial fibrillation 3. Hypertensive heart disease 4. End-stage renal disease 5. Diabetes mellitus with complications of retinopathy, nephropathy, neuropathy 6. mild to moderate aortic stenosis 7. Thrombocytopenia on intravenous heparin 8. Weight loss 9. Significant peripheral edema -may have elements of nephrotic syndrome with low albumin 10. Malnutrition 11. Coronary atherosclerosis and aortic atherosclerosis as manifested by coronary calcification on CT scan in. Recent catheterization 5 years ago did not show obstructive disease by history 12. Tobacco abuse with need to stop smoking 13. Significant lumbar disc disease and low back pain  RECOMMENDATION: 1. She'll need to be anticoagulated long term. Her CHADS2VASC score is 4 at this time. I would recommend initiating warfarin in the hospital without coverage with heparin 2. Repeat echocardiogram. Her previous echo showed preserved systolic function but evidence of diastolic dysfunction 3. Evaluation of low albumin as this may be contributing to her generalized edema 4. Rate control initially with beta blockers and may add diltiazem if beta blockers not able to control the rate 5. Following anticoagulation for 3-4 weeks consider initiation of amiodarone followed by cardioversion if she fails to convert back to sinus rhythm. 6. Because she lives in  [place], we'll need to make arrangements to have her pro time followed in that area. 7. Obtain records of previous cardiology evaluation including catheterization 5 years ago. 8. Smoking cessation  HISTORY: This 62 year old female has a long-standing history of diabetes with complications and has developed end-stage renal disease. She went on dialysis in August and has had a poor time tolerating it since then. She states that she has a lot of low back pain and has difficulty tolerating dialysis. She's had a lot of trouble with persistent edema as well as dyspnea. She was admitted after she had a fall and was noted to be in atrial fibrillation with somewhat rapid response. She has since been dialyzed (she dialyzes on Tuesday Thursday and Saturday) and complains of feeling quite weak. She does not have any angina. She has not been monitored on telemetry so it is difficult to tell how long she has been in atrial fibrillation. She was initially placed on heparin but had a low platelet count come back today and her heparin has been discontinued. She has a new pleural effusion that could be related to the atrial fibrillation she has chronic edema and does not have PND or orthopnea.  Past Medical History  Diagnosis Date  . Neuropathy due to secondary diabetes   . Diabetic nephropathy   . Hypertension   . Anemia   . Dyslipidemia   . Laryngeal mass   . COPD (chronic obstructive pulmonary disease)   . GERD (gastroesophageal reflux disease)   . Sleep apnea     Does not use CPAP- due to weight loss  . Atrial fibrillation     Diagnosed 11/14 of undetermined age of onset    . 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 CAD (  coronary artery disease) 07/13/2013    Catheterization 5 years ago by Dr. Lynnea Ferrier with reportedly nonobstructive disease Calcification noted on CT scan of chest in November of 2014   . ESRD on hemodialysis   . Chronic diastolic heart failure   . Aortic stenosis 12/23/2012  .  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 Atherosclerosis of aorta       Past Surgical History  Procedure Laterality Date  . Cholecystectomy    . Appendectomy    . Shoulder arthroscopy Right     w/ repair of rotator cuff repair  . Elbow tendon surgery Right   . Achilles tendon repair Right   . Cesarean section      X 2   . Hemorrhoid surgery      many years ago  . Av fistula placement Left 12/29/2012    Procedure: ARTERIOVENOUS (AV) FISTULA CREATION;  Surgeon: Sherren Kerns, MD;  Location: [facility] OR;  Service: Vascular;  Laterality: Left;  Creation Left Brachial Cephalic Arteriovenous Fistula  . Cardiac catheterization    . Laryngectomy      mass removed- noncancerous  . Lumbar laminectomy      lower back     Allergies:  is allergic to doxycycline and lipitor.   Medications: Prior to Admission medications   Medication Sig Start Date End Date Taking? Authorizing Provider  atenolol (TENORMIN) 50 MG tablet Take 50 mg by mouth 2 (two) times daily.    Yes Historical Provider, MD  calcitRIOL (ROCALTROL) 0.25 MCG capsule Take 0.25 mcg by mouth every Monday, Wednesday, and Friday.    Yes Historical Provider, MD  calcium acetate (PHOSLO) 667 MG capsule Take 667 mg by mouth 3 (three) times daily with meals.   Yes Historical Provider, MD  Cinnamon ([employer] CINNAMON) 500 MG capsule Take 500 mg by mouth daily.   Yes Historical Provider, MD  cloNIDine (CATAPRES) 0.2 MG tablet Take 0.2 mg by mouth 2 (two) times daily.   Yes Historical Provider, MD  Cyanocobalamin (VITAMIN B 12 PO) Take 1,000 mg by mouth daily.   Yes Historical Provider, MD  furosemide (LASIX) 40 MG tablet Take 80 mg by mouth 2 (two) times daily.    Yes Historical Provider, MD  hydrALAZINE (APRESOLINE) 25 MG tablet Take 25 mg by mouth 2 (two) times daily.  12/22/12  Yes Historical Provider, MD  HYDROcodone-acetaminophen (NORCO) 7.5-325 MG per tablet Take 1 tablet by mouth every 6 (six) hours as needed for pain. 02/15/13  Yes Regina J  Roczniak, PA-C  lisinopril (PRINIVIL,ZESTRIL) 20 MG tablet Take 20 mg by mouth 2 (two) times daily.    Yes Historical Provider, MD  omeprazole (PRILOSEC) 20 MG capsule Take 20 mg by mouth daily as needed (acid reflux).    Yes Historical Provider, MD  polyethylene glycol powder (GLYCOLAX/MIRALAX) powder Take 17 g by mouth daily. 06/01/13  Yes Raynelle Fanning Idol, PA-C  zolpidem (AMBIEN) 10 MG tablet Take 10 mg by mouth at bedtime.    Yes Historical Provider, MD    Family History: Family Status  Relation Status Death Age  . Mother Deceased 41    Post lung transplant for emphysema  . Father Deceased 2    multiple sclerosis, heart failure  . Brother Deceased     suicide  . Brother Deceased     Social History:   reports that she has been smoking Cigarettes.  She has a 40 pack-year smoking history. She has never used smokeless tobacco. She reports that she does  not drink alcohol or use illicit drugs.   History   Social History Narrative   62 yo woman who lives at home with her husband of 44 years. She has a 40 pack year history of smoking, denies etoh or illicit drug use. 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     Review of Systems: She has lost a significant amount of weight within the past year. She has significant generalized edema at that has been difficult to get off with dialysis and has low albumin. She has significant insomnia. She continues to smoke and has ongoing shortness of breath. She also has some decreased appetite as well as some mild nausea. She still dribbles some urine. She has significant chronic back pain due to previous lumbar disc disease as well as pain involving her tailbone it prevents her from lying down well. She is not active and does not do much in the way of regular walking. Other than as noted above the remainder of the review of systems is  unremarkable.  Physical Exam: BP 109/73  Pulse 104  Temp(Src) 97.9 F (36.6 C) (Oral)  Resp 16  Ht 5' 6.5" (1.689 m)  Wt 93.4 kg (205 lb 14.6 oz)  BMI 32.74 kg/m2  SpO2 97%  General appearance: She is a mildly obese white female who appears older than stated age and is uncomfortable, examined on dialysis. Head: Normocephalic, without obvious abnormality, atraumatic Eyes: conjunctivae/corneas clear. PERRL, EOM's intact. Fundi not examined  Neck: no adenopathy, no carotid bruit, no JVD and supple, symmetrical, trachea midline Lungs: Reduced breath sounds at the right base, no definite rales Heart: Irregular rhythm, 2/6 systolic murmur at the aortic valve with no diastolic component noted, no S3 Abdomen: Obese, no tenderness, no masses Pelvic: deferred Extremities: 2+ peripheral edema which is generalized , AV fistula is present in the left upper forearm Pulses: Femoral pulses are 2+ and present, peripheral pulses are palpable but mildly diminished Skin: Multiple ecchymoses noted, fragile skin, fistula noted in left upper extremity Neurologic: Grossly normal   Labs: CBC  Recent Labs  07/12/13 1008 07/13/13 0730  WBC 7.3 6.8  RBC 4.19 3.71*  HGB 12.3 10.9*  HCT 35.4* 31.3*  PLT 210 77*  MCV 84.5 84.4  MCH 29.4 29.4  MCHC 34.7 34.8  RDW 17.2* 17.2*  LYMPHSABS 1.8  --   MONOABS 0.4  --   EOSABS 0.0  --   BASOSABS 0.0  --    CMP   Recent Labs  07/12/13 1008  NA 134*  K 3.3*  CL 89*  CO2 24  GLUCOSE 100*  BUN 37*  CREATININE 4.73*  CALCIUM 7.8*  PROT 5.4*  ALBUMIN 2.2*  AST 17  ALT 23  ALKPHOS 67  BILITOT 0.3  GFRNONAA 9*  GFRAA 10*   Cardiac Panel (last 3 results)  Recent Labs  07/12/13 1008  TROPONINI <0.30     Radiology: Right pleural effusion is noted on chest x-ray. CT scan shows calcified coronary arteries and aorta and in addition shows mild ascites and some bowel edema.  EKG: Atrial fibrillation with somewhat rapid ventricular response  on admission, nonspecific ST and T-wave changes.  Signed:  Darden Palmer MD [facility]   Cardiology Consultant  07/13/2013, 1:13 PM

## 2013-07-13 NOTE — Progress Notes (Signed)
INITIAL NUTRITION ASSESSMENT  DOCUMENTATION CODES Per approved criteria  -Severe malnutrition in the context of chronic illness -Obesity Unspecified   INTERVENTION:  Nepro Carb Steady twice daily (425 kcals, 19.1 gm protein per 8 fl oz bottle)  Recommend addition of RENA-VIT daily RD to follow for nutrition care plan  NUTRITION DIAGNOSIS: .Increased nutrient needs related to ESRD on HD as evidenced by estimated nutrition needs  Goal: Pt to meet >/= 90% of their estimated nutrition needs   Monitor:  PO & supplemental intake, weight, labs, I/O's  Reason for Assessment: Malnutrition Screening Tool Report  62 y.o. female  Admitting Dx: Dyspnea  ASSESSMENT: Patient with PMH of ESRD on HD, HTN, DM2, COPD, diastolic CHF and GERD who presented for weakness and a fall; further symptoms include weakness since starting HD and persistent LE edema along with orthopnea and occasional PAD; CXR showed right sided pleural effusion.  RD spoke with patient's husband; reports patient wasn't eating well PTA; they would prepare 3 meals per day, however, she would eat small amounts of her food; per weight readings, patient has had severe weight loss since July 2014; s/p bedside swallow evaluation -- SLP recommending regular textures with thin liquids.  Telephone with readback order received per Dr. Johna Roles to change diet order (ie liquids).  Patient meets criteria for severe malnutrition in the context of chronic illness as evidenced by < 75% intake of estimated energy requirement for > 1 month and 15% weight loss x 4 months (severe for time frame).  Height: Ht Readings from Last 1 Encounters:  07/12/13 5' 6.5" (1.689 m)    Weight: Wt Readings from Last 1 Encounters:  07/13/13 205 lb 14.6 oz (93.4 kg)    Ideal Body Weight: 130 lb  % Ideal Body Weight: 157%  Wt Readings from Last 10 Encounters:  07/13/13 205 lb 14.6 oz (93.4 kg)  06/01/13 205 lb (92.987 kg)  03/16/13 217 lb (98.431 kg)   02/15/13 241 lb (109.317 kg)  02/15/13 241 lb (109.317 kg)  02/03/13 251 lb (113.853 kg)  12/28/12 238 lb 5.1 oz (108.1 kg)  12/23/12 243 lb (110.224 kg)    Usual Body Weight: 241 lb  % Usual Body Weight: 85%  BMI:  Body mass index is 32.74 kg/(m^2).  Estimated Nutritional Needs: Kcal: 1700-1900 Protein: 80-90 gm Fluid: 1.7-1.9 L  Skin: Intact  Diet Order: Renal 80/90-09-19-1198 ml  EDUCATION NEEDS: -No education needs identified at this time   Intake/Output Summary (Last 24 hours) at 07/13/13 1533 Last data filed at 07/13/13 0900  Gross per 24 hour  Intake     39 ml  Output   1982 ml  Net  -1943 ml    Labs:   Recent Labs Lab 07/12/13 1008  NA 134*  K 3.3*  CL 89*  CO2 24  BUN 37*  CREATININE 4.73*  CALCIUM 7.8*  GLUCOSE 100*    CBG (last 3)   Recent Labs  07/12/13 1712  GLUCAP 83    Scheduled Meds: . albumin human  25 g Intravenous Once  . calcitRIOL  0.25 mcg Oral Q M,W,F  . calcium acetate  667 mg Oral TID WC  . coumadin book   Does not apply Once  . furosemide  80 mg Oral BID  . metoprolol tartrate  25 mg Oral BID  . pantoprazole  40 mg Oral Daily  . polyethylene glycol  17 g Oral Daily  . sodium chloride  3 mL Intravenous Q12H  . warfarin  5  mg Oral ONCE-1800  . warfarin   Does not apply Once  . Warfarin - Pharmacist Dosing Inpatient   Does not apply q1800  . zolpidem  5 mg Oral QHS    Continuous Infusions:   Past Medical History  Diagnosis Date  . Neuropathy due to secondary diabetes   . Diabetic nephropathy   . Hypertension   . Anemia   . Dyslipidemia   . Laryngeal mass   . COPD (chronic obstructive pulmonary disease)   . GERD (gastroesophageal reflux disease)   . Sleep apnea     Does not use CPAP- due to weight loss  . Atrial fibrillation     Diagnosed 11/14 of undetermined age of onset    . 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 CAD (coronary artery disease) 07/13/2013    Catheterization 5 years ago by  Dr. Lynnea Ferrier with reportedly nonobstructive disease Calcification noted on CT scan of chest in November of 2014   . ESRD on hemodialysis   . Chronic diastolic heart failure   . Aortic stenosis 12/23/2012  . 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 Atherosclerosis of aorta     Past Surgical History  Procedure Laterality Date  . Cholecystectomy    . Appendectomy    . Shoulder arthroscopy Right     w/ repair of rotator cuff repair  . Elbow tendon surgery Right   . Achilles tendon repair Right   . Cesarean section      X 2   . Hemorrhoid surgery      many years ago  . Av fistula placement Left 12/29/2012    Procedure: ARTERIOVENOUS (AV) FISTULA CREATION;  Surgeon: Sherren Kerns, MD;  Location: [facility] OR;  Service: Vascular;  Laterality: Left;  Creation Left Brachial Cephalic Arteriovenous Fistula  . Cardiac catheterization    . Laryngectomy      mass removed- noncancerous  . Lumbar laminectomy      lower back    Maureen Chatters, RD, LDN Pager #: [phone] After-Hours Pager #: [phone]

## 2013-07-13 NOTE — Evaluation (Signed)
Clinical/Bedside Swallow Evaluation Patient Details  Name: Anita [name] MRN: [id number] Date of Birth: May 01, 1951  Today's Date: 07/13/2013 Time: 0900-0917 SLP Time Calculation (min): 17 min  Past Medical History:  Past Medical History  Diagnosis Date  . Peripheral neuropathy   . Heart murmur   . ESRD (end stage renal disease)     Chronic Renal Insufficiency  . Diabetic nephropathy   . Proteinuria     Progression  . Hypertension   . Anemia   . Dyslipidemia   . Laryngeal mass   . COPD (chronic obstructive pulmonary disease)   . CHF (congestive heart failure)   . PONV (postoperative nausea and vomiting)   . Shortness of breath     with too much fluid  . GERD (gastroesophageal reflux disease)   . Sleep apnea     Does not use CPAP- due to weight loss  . Diabetes mellitus without complication     pt denies   Past Surgical History:  Past Surgical History  Procedure Laterality Date  . Cholecystectomy    . Appendectomy    . Shoulder arthroscopy Right     w/ repair of rotator cuff repair  . Elbow tendon surgery Right   . Achilles tendon repair Right   . Cesarean section      X 2   . Hemorrhoid surgery      many years ago  . Av fistula placement Left 12/29/2012    Procedure: ARTERIOVENOUS (AV) FISTULA CREATION;  Surgeon: Sherren Kerns, MD;  Location: [facility] OR;  Service: Vascular;  Laterality: Left;  Creation Left Brachial Cephalic Arteriovenous Fistula  . Cardiac catheterization    . Laryngectomy      mass removed- noncancerous  . Back surgery      lower back   HPI:  62 year old female with past medical history of ESRD on HD (TTS) since 03/2013, HTN, HL, T2DM, COPD, diastolic CHF, OSA, and GERD who presents with weakness and recent fall. Pt states that she was returning to her front door step and lost her grip causing her to fall backwards unto the ground resulting in hitting her head without LOC. Pt reports a knot on the left side of the back of her head without visual  disturbance. She also suffered some cuts to her arms and wrists after the fall.  Admitted with weakness, persistent LE edema, decreased appetite, weight loss (40 lbs in 6 weeks), and difficulty swallowing for a few weeks (pills). H/o thyroid mass noted. Found to be in atrial fibrillation with RVR and hypoxic with right loculated pleural effusion.    Assessment / Plan / Recommendation Clinical Impression  Suspect a primary esophageal dysphagia in this 62 year old female who presents with continued c/o globus with soft solids and pills despite what appears to be  normal oropharyngeal swallowing function. Education complete with patient and husband regarding general esophageal precautions which patient will be able to complete independently. Defer further management of GERD-like/esophageal symptoms to MD. Did note some anterior spurring of C5-6 on CT scan which may also be contributing to mild CP segment residuals causing globus post swallow.      Aspiration Risk  Mild    Diet Recommendation Regular;Thin liquid   Liquid Administration via: Cup;Straw Medication Administration: Whole meds with liquid Supervision: Patient able to self feed Compensations: Slow rate;Small sips/bites;Follow solids with liquid Postural Changes and/or Swallow Maneuvers: Seated upright 90 degrees;Upright 30-60 min after meal    Other  Recommendations Recommended Consults: Other (Comment) (  MD f/u for possible medication management of symptoms) Oral Care Recommendations: Oral care BID   Follow Up Recommendations  None    Frequency and Duration        Pertinent Vitals/Pain none        Swallow Study    General HPI: 62 year old female with past medical history of ESRD on HD (TTS) since 03/2013, HTN, HL, T2DM, COPD, diastolic CHF, OSA, and GERD who presents with weakness and recent fall. Pt states that she was returning to her front door step and lost her grip causing her to fall backwards unto the ground resulting in  hitting her head without LOC. Pt reports a knot on the left side of the back of her head without visual disturbance. She also suffered some cuts to her arms and wrists after the fall.  Admitted with weakness, persistent LE edema, decreased appetite, weight loss (40 lbs in 6 weeks), and difficulty swallowing for a few weeks (pills). H/o thyroid mass noted. Found to be in atrial fibrillation with RVR and hypoxic with right loculated pleural effusion.  Type of Study: Bedside swallow evaluation Previous Swallow Assessment: none Diet Prior to this Study: Regular;Thin liquids Temperature Spikes Noted: No Respiratory Status: Room air History of Recent Intubation: No Behavior/Cognition: Pleasant mood;Cooperative;Alert Oral Cavity - Dentition: Adequate natural dentition Self-Feeding Abilities: Able to feed self Patient Positioning: Upright in bed Baseline Vocal Quality: Clear Volitional Cough: Strong Volitional Swallow: Able to elicit    Oral/Motor/Sensory Function Overall Oral Motor/Sensory Function: Appears within functional limits for tasks assessed   Ice Chips Ice chips: Not tested   Thin Liquid Thin Liquid: Within functional limits Presentation: Cup;Self Fed;Straw    Nectar Thick Nectar Thick Liquid: Not tested   Honey Thick Honey Thick Liquid: Not tested   Puree Puree: Within functional limits Presentation: Self Fed;Spoon   Solid   GO Functional Assessment Tool Used: skilled clinical judgement Functional Limitations: Swallowing Swallow Current Status ([id number]): At least 1 percent but less than 20 percent impaired, limited or restricted Swallow Goal Status [phone]): At least 1 percent but less than 20 percent impaired, limited or restricted Swallow Discharge Status [phone]): At least 1 percent but less than 20 percent impaired, limited or restricted  Solid: Within functional limits Presentation: Self [employer] MA, CCC-SLP [phone]  Lucca Ballo Meryl 07/13/2013,9:22  AM

## 2013-07-13 NOTE — Progress Notes (Signed)
Subjective: Patient states she is doing a little better this morning. She had HD last night (2L pulled) with low BPs noted afterwards and was given NS bolus overnight. Clonidine has been stopped and BP has rebounded. She has not been out of bed and notes her weakness is still at baseline in all 4 extremities. She has been on heparin for new onset afib since admission. She denies fevers, chills, SOB, CP, N/V/D, constipation, or increased swelling.   Objective: Vital signs in last 24 hours: Filed Vitals:   07/13/13 0800 07/13/13 0830 07/13/13 0900 07/13/13 0930  BP: 110/63 117/56 121/82 131/72  Pulse: 28 119 124 118  Temp:      TempSrc:      Resp: 23 23    Height:      Weight:      SpO2: 87% 95% 95% 98%   Weight change:   Intake/Output Summary (Last 24 hours) at 07/13/13 1032 Last data filed at 07/13/13 0900  Gross per 24 hour  Intake    539 ml  Output   1982 ml  Net  -1443 ml   BP 131/72  Pulse 118  Temp(Src) 97.6 F (36.4 C) (Oral)  Resp 23  Ht 5' 6.5" (1.689 m)  Wt 89.2 kg (196 lb 10.4 oz)  BMI 31.27 kg/m2  SpO2 98% General appearance: alert, cooperative and no distress Eyes: conjunctivae/corneas clear. PERRL, EOM's intact. Fundi benign. Neck: no adenopathy, no carotid bruit, no JVD and supple, symmetrical, trachea midline Lungs: rales bibasilar Heart: irregularly irregular rhythm Abdomen: soft, non-tender; bowel sounds normal; no masses,  no organomegaly Extremities: edema 3+ pitting edema B/L LE feet to subpatellar Skin: abrasions to left hand/wrist, diffuse senile purpura Neurologic: Mental status: Alert, oriented, thought content appropriate Motor: UE 4/5, LE 2/5 Lab Results: Basic Metabolic Panel:  Recent Labs Lab 07/12/13 1008  NA 134*  K 3.3*  CL 89*  CO2 24  GLUCOSE 100*  BUN 37*  CREATININE 4.73*  CALCIUM 7.8*   Liver Function Tests:  Recent Labs Lab 07/12/13 1008  AST 17  ALT 23  ALKPHOS 67  BILITOT 0.3  PROT 5.4*  ALBUMIN 2.2*   No  results found for this basename: LIPASE, AMYLASE,  in the last 168 hours No results found for this basename: AMMONIA,  in the last 168 hours CBC:  Recent Labs Lab 07/12/13 1008 07/13/13 0730  WBC 7.3 6.8  NEUTROABS 5.1  --   HGB 12.3 10.9*  HCT 35.4* 31.3*  MCV 84.5 84.4  PLT 210 77*   Cardiac Enzymes:  Recent Labs Lab 07/12/13 1008  TROPONINI <0.30   BNP: No results found for this basename: PROBNP,  in the last 168 hours D-Dimer: No results found for this basename: DDIMER,  in the last 168 hours CBG:  Recent Labs Lab 07/12/13 1712  GLUCAP 83   Hemoglobin A1C: No results found for this basename: HGBA1C,  in the last 168 hours Fasting Lipid Panel: No results found for this basename: CHOL, HDL, LDLCALC, TRIG, CHOLHDL, LDLDIRECT,  in the last 168 hours Thyroid Function Tests: No results found for this basename: TSH, T4TOTAL, FREET4, T3FREE, THYROIDAB,  in the last 168 hours Coagulation:  Recent Labs Lab 07/12/13 1008  LABPROT 11.8  INR 0.88   Anemia Panel: No results found for this basename: VITAMINB12, FOLATE, FERRITIN, TIBC, IRON, RETICCTPCT,  in the last 168 hours Urine Drug Screen: Drugs of Abuse  No results found for this basename: labopia, cocainscrnur, labbenz, amphetmu, thcu, labbarb  Alcohol Level: No results found for this basename: ETH,  in the last 168 hours Urinalysis: No results found for this basename: COLORURINE, APPERANCEUR, LABSPEC, PHURINE, GLUCOSEU, HGBUR, BILIRUBINUR, KETONESUR, PROTEINUR, UROBILINOGEN, NITRITE, LEUKOCYTESUR,  in the last 168 hours  Micro Results: No results found for this or any previous visit (from the past 240 hour(s)). Studies/Results: Ct Abdomen Pelvis Wo Contrast  07/12/2013   CLINICAL DATA:  Fall with head injury and altered level of consciousness.  EXAM: CT CHEST, ABDOMEN AND PELVIS WITHOUT CONTRAST  TECHNIQUE: Multidetector CT imaging of the chest, abdomen and pelvis was performed following the standard  protocol without IV contrast.  COMPARISON:  Multiple exams, including 07/12/2013 and 03/21/2009.  FINDINGS: CT CHEST FINDINGS  2.0 cm partially calcified nodule the right thyroid lobe.  Coronary artery atherosclerosis with calcification of the aortic valve and mild cardiomegaly. Diffuse subcutaneous and mediastinal edema.  Moderate and likely loculated right pleural effusion with small left pleural effusion. Passive atelectasis noted. Suspected centrilobular emphysema.  CT ABDOMEN AND PELVIS FINDINGS  Diffuse subcutaneous edema with low grade mesenteric edema, all increased from prior. Trace fluid in the left paracolic gutter. Stable low-density left adrenal mass compatible with adenoma. Mild renal atrophy. Pancreas unremarkable in non-contrast CT appearance.  Aortoiliac atherosclerotic vascular disease noted with scattered small retroperitoneal lymph nodes. Sigmoid diverticulosis. No dilated bowel. Small amount of free pelvic fluid.  IMPRESSION: 1. Diffuse subcutaneous, mediastinal, and mesenteric edema compatible with 3rd spacing of fluid. 2. Moderate right and small left pleural effusions. The right pleural effusion is probably loculated. 3. Right-sided thyroid nodule. Consider further evaluation with thyroid ultrasound. If patient is clinically hyperthyroid, consider nuclear medicine thyroid uptake and scan. 4. Atherosclerosis. 5. Stable left adrenal adenoma. 6. Small amount of ascites. Please note that today's exam was performed without IV contrast and accordingly does not have high sensitivity for solid organ laceration. 7. Sigmoid diverticulosis. 8. Centrilobular emphysema.   Electronically Signed   By: Herbie Baltimore M.D.   On: 07/12/2013 12:33   Dg Wrist Complete Left  07/12/2013   CLINICAL DATA:  Status post fall  EXAM: LEFT WRIST - COMPLETE 3+ VIEW  FINDINGS: There is no evidence of fracture or dislocation. There is no evidence of arthropathy or other focal bone abnormality. Soft tissues are  unremarkable. The bones are osteopenic.  IMPRESSION: Osteopenia without evidence of acute osseous abnormalities.   Electronically Signed   By: Salome Holmes M.D.   On: 07/12/2013 11:53   Ct Head Wo Contrast  07/12/2013   CLINICAL DATA:  Fall  EXAM: CT HEAD WITHOUT CONTRAST  CT CERVICAL SPINE WITHOUT CONTRAST  TECHNIQUE: Multidetector CT imaging of the head and cervical spine was performed following the standard protocol without intravenous contrast. Multiplanar CT image reconstructions of the cervical spine were also generated.  COMPARISON:  None.  FINDINGS: CT HEAD FINDINGS  No skull fracture is noted. Paranasal sinuses and mastoid air cells are unremarkable. There is skull swelling and subcutaneous stranding in left occipital region. Subcutaneous hematoma in this region measures about 2.3 cm.  No intracranial hemorrhage, mass effect or midline shift. Atherosclerotic calcifications of carotid siphon are noted. Mild cerebral atrophy is noted. Tiny lacunar infarct is noted in right basal ganglia. No acute cortical infarction. No mass lesion is noted on this unenhanced scan.  CT CERVICAL SPINE FINDINGS  Axial images of the cervical spine shows no acute fracture or subluxation. Computer processed images shows no acute fracture or subluxation. Degenerative changes are noted C1-C2 articulation. Mild disc  space flattening with anterior spurring at C5-C6 level. No prevertebral soft tissue swelling. Cervical airway is patent. There is no pneumothorax in visualized left lung apex.  IMPRESSION: 1. No acute intracranial abnormality. There is scalp swelling and subcutaneous stranding in left occipital region. Subcutaneous hematoma in left occipital region measures 2.4 cm. Mild cerebral atrophy. 2. No cervical spine acute fracture or subluxation. Mild degenerative changes.   Electronically Signed   By: Natasha Mead M.D.   On: 07/12/2013 11:22   Ct Chest Wo Contrast  07/12/2013   CLINICAL DATA:  Fall with head injury and  altered level of consciousness.  EXAM: CT CHEST, ABDOMEN AND PELVIS WITHOUT CONTRAST  TECHNIQUE: Multidetector CT imaging of the chest, abdomen and pelvis was performed following the standard protocol without IV contrast.  COMPARISON:  Multiple exams, including 07/12/2013 and 03/21/2009.  FINDINGS: CT CHEST FINDINGS  2.0 cm partially calcified nodule the right thyroid lobe.  Coronary artery atherosclerosis with calcification of the aortic valve and mild cardiomegaly. Diffuse subcutaneous and mediastinal edema.  Moderate and likely loculated right pleural effusion with small left pleural effusion. Passive atelectasis noted. Suspected centrilobular emphysema.  CT ABDOMEN AND PELVIS FINDINGS  Diffuse subcutaneous edema with low grade mesenteric edema, all increased from prior. Trace fluid in the left paracolic gutter. Stable low-density left adrenal mass compatible with adenoma. Mild renal atrophy. Pancreas unremarkable in non-contrast CT appearance.  Aortoiliac atherosclerotic vascular disease noted with scattered small retroperitoneal lymph nodes. Sigmoid diverticulosis. No dilated bowel. Small amount of free pelvic fluid.  IMPRESSION: 1. Diffuse subcutaneous, mediastinal, and mesenteric edema compatible with 3rd spacing of fluid. 2. Moderate right and small left pleural effusions. The right pleural effusion is probably loculated. 3. Right-sided thyroid nodule. Consider further evaluation with thyroid ultrasound. If patient is clinically hyperthyroid, consider nuclear medicine thyroid uptake and scan. 4. Atherosclerosis. 5. Stable left adrenal adenoma. 6. Small amount of ascites. Please note that today's exam was performed without IV contrast and accordingly does not have high sensitivity for solid organ laceration. 7. Sigmoid diverticulosis. 8. Centrilobular emphysema.   Electronically Signed   By: Herbie Baltimore M.D.   On: 07/12/2013 12:33   Ct Cervical Spine Wo Contrast  07/12/2013   CLINICAL DATA:  Fall   EXAM: CT HEAD WITHOUT CONTRAST  CT CERVICAL SPINE WITHOUT CONTRAST  TECHNIQUE: Multidetector CT imaging of the head and cervical spine was performed following the standard protocol without intravenous contrast. Multiplanar CT image reconstructions of the cervical spine were also generated.  COMPARISON:  None.  FINDINGS: CT HEAD FINDINGS  No skull fracture is noted. Paranasal sinuses and mastoid air cells are unremarkable. There is skull swelling and subcutaneous stranding in left occipital region. Subcutaneous hematoma in this region measures about 2.3 cm.  No intracranial hemorrhage, mass effect or midline shift. Atherosclerotic calcifications of carotid siphon are noted. Mild cerebral atrophy is noted. Tiny lacunar infarct is noted in right basal ganglia. No acute cortical infarction. No mass lesion is noted on this unenhanced scan.  CT CERVICAL SPINE FINDINGS  Axial images of the cervical spine shows no acute fracture or subluxation. Computer processed images shows no acute fracture or subluxation. Degenerative changes are noted C1-C2 articulation. Mild disc space flattening with anterior spurring at C5-C6 level. No prevertebral soft tissue swelling. Cervical airway is patent. There is no pneumothorax in visualized left lung apex.  IMPRESSION: 1. No acute intracranial abnormality. There is scalp swelling and subcutaneous stranding in left occipital region. Subcutaneous hematoma in left occipital region measures  2.4 cm. Mild cerebral atrophy. 2. No cervical spine acute fracture or subluxation. Mild degenerative changes.   Electronically Signed   By: Natasha Mead M.D.   On: 07/12/2013 11:22   Dg Pelvis Portable  07/12/2013   CLINICAL DATA:  Fall, weakness and pain.  EXAM: PORTABLE PELVIS 1-2 VIEWS  COMPARISON:  CT abdomen and pelvis 03/21/2009.  FINDINGS: There is no evidence of pelvic fracture or diastasis. No other pelvic bone lesions are seen.  IMPRESSION: Negative exam.   Electronically Signed   By: Drusilla Kanner M.D.   On: 07/12/2013 11:09   Dg Chest Portable 1 View  07/12/2013   CLINICAL DATA:  Status post fall now with weakness history of dialysis dependent renal failure, and diabetes.  EXAM: PORTABLE CHEST - 1 VIEW  COMPARISON:  Dec 28, 2012.  FINDINGS: There is new blunting of the right lateral costophrenic angle. There is no pneumothorax. The observed portions of the overlying ribs appear normal. The left lung is well expanded and clear. The cardiac silhouette is mildly enlarged though stable. The pulmonary vascularity is somewhat indistinct on the right.  IMPRESSION: There is a new right pleural effusion. This may be secondary to the patient's underlying cardiac and renal disease or reflect post traumatic processes. As best as can be determined the right ribs are intact. Followup chest CT scanning may be useful if the patient has experienced significant thoracic trauma.   Electronically Signed   By: Deziah Renwick  [place]   On: 07/12/2013 11:07   Dg Humerus Left  07/12/2013   CLINICAL DATA:  Status post fall  EXAM: LEFT HUMERUS - 2+ VIEW  COMPARISON:  None.  FINDINGS: There is no evidence of fracture or other focal bone lesions. Soft tissues are unremarkable.  IMPRESSION: Negative.   Electronically Signed   By: Salome Holmes M.D.   On: 07/12/2013 11:54   Dg Hand Complete Left  07/12/2013   CLINICAL DATA:  Status post fall  EXAM: LEFT HAND - COMPLETE 3+ VIEW  COMPARISON:  None.  FINDINGS: The bones are osteopenic. Mild osteoarthritic changes identified within the proximal and distal interphalangeal joints. There is no evidence of acute fracture nor dislocation.  IMPRESSION: 1. Mild osteoarthritic changes. 2. No evidence of acute osseous abnormalities.   Electronically Signed   By: Salome Holmes M.D.   On: 07/12/2013 11:48   Medications:  I have reviewed the patient's current medications. Prior to Admission:  Prescriptions prior to admission  Medication Sig Dispense Refill  . atenolol (TENORMIN) 50  MG tablet Take 50 mg by mouth 2 (two) times daily.       . calcitRIOL (ROCALTROL) 0.25 MCG capsule Take 0.25 mcg by mouth every Monday, Wednesday, and Friday.       . calcium acetate (PHOSLO) 667 MG capsule Take 667 mg by mouth 3 (three) times daily with meals.      Marland Kitchen Cinnamon ([facility] CINNAMON) 500 MG capsule Take 500 mg by mouth daily.      . cloNIDine (CATAPRES) 0.2 MG tablet Take 0.2 mg by mouth 2 (two) times daily.      . Cyanocobalamin (VITAMIN B 12 PO) Take 1,000 mg by mouth daily.      . furosemide (LASIX) 40 MG tablet Take 80 mg by mouth 2 (two) times daily.       . hydrALAZINE (APRESOLINE) 25 MG tablet Take 25 mg by mouth 2 (two) times daily.       Marland Kitchen HYDROcodone-acetaminophen (NORCO) 7.5-325 MG per tablet  Take 1 tablet by mouth every 6 (six) hours as needed for pain.  30 tablet  0  . lisinopril (PRINIVIL,ZESTRIL) 20 MG tablet Take 20 mg by mouth 2 (two) times daily.       Marland Kitchen omeprazole (PRILOSEC) 20 MG capsule Take 20 mg by mouth daily as needed (acid reflux).       . polyethylene glycol powder (GLYCOLAX/MIRALAX) powder Take 17 g by mouth daily.  527 g  0  . zolpidem (AMBIEN) 10 MG tablet Take 10 mg by mouth at bedtime.        Scheduled: . calcitRIOL  0.25 mcg Oral Q M,W,F  . calcium acetate  667 mg Oral TID WC  . furosemide  80 mg Oral BID  . pantoprazole  40 mg Oral Daily  . polyethylene glycol  17 g Oral Daily  . sodium chloride  3 mL Intravenous Q12H  . zolpidem  5 mg Oral QHS   Continuous:  Scheduled Meds: . calcitRIOL  0.25 mcg Oral Q M,W,F  . calcium acetate  667 mg Oral TID WC  . furosemide  80 mg Oral BID  . pantoprazole  40 mg Oral Daily  . polyethylene glycol  17 g Oral Daily  . sodium chloride  3 mL Intravenous Q12H  . zolpidem  5 mg Oral QHS   Continuous Infusions:  PRN Meds:.albuterol, HYDROcodone-acetaminophen Assessment/Plan: Principal Problem:   Dyspnea Active Problems:   Accidental fall on or from sidewalk curb A: Patient is a 62 yo woman with PMH ESRD  (TTS), DM, HTN, COPD, CHF who presented s/p fall at home with no LOC and no acute findings on head/brain imaging studies. Workup has revealed new onset afib (age indeterminate), bilateral pleural effusions, and weakness.  # Acute Thrombocytopenia Patient was started on heparin for afib 11/25 on admission with drop in PLTC 210>>77. Patient is not heparin naive as she has been a dialysis patient since August. HIT suspected with a 4T score = 4 (2 pts for >50% fall in PLTC count with a nadir <100,000 and 2 pts for onset of fall <=1day if heparin exposure within past 30 days). Other possibility remains lab error. Recheck of CBC reveals a PLTC of 160. - STAT recheck CBC (appreciate nursing and phlebotomy assistance in difficult blood draw patient) - HIT panel - lab error suspected, now with PLTC 160  #Afib (new diagnosis, age indeterminate) with RVR Newly diagnosed on admission. Patient's previous EKG in May 2014 with no evidence of afib and patient has no known history of afib. Ventricular rate of 129 on admission with 5mg  cardizem given in ED with mild response. Heparin started with drop in PLTC count noted today. CHA2DS2-VASc score = 4 (4% annual stroke risk) - cardiology consult for new afib (appreciate their assistance with this patient and recommendations) - will begin coumadin for afib with no heparin bridge per cardiology - 2D echo pending - lopressor 25 BID added per cardiology for rate control - If fails to convert to NSR, will consider amio followed by cardioversion after 3-4 weeks, per cardiology recs  # Pleural effusion  CT chest reveals likely loculated right pleural effusion and small left effusion. Remains afebrile without leukocytosis. Most likely related to her CHF but differential still includes pna or possible occult malignancy, and less likely pneumoconiosis. Oxygen sats have improved and she no longer requires oxygen. - will obtain CXR after HD today for continued resolvement  #  ESRD  Patient continues to have signs of volume overload given  her LE edema and crackles. Her dyspnea has improved and she has been weaned off wall oxygen. - appreciate nephrology consult and will follow recommendations. Plan is repeat HD this afternoon - renal diet  - will continue home calcitriol, calcium acetate, and lasix   # HTN  Hstory of hypertension with low BP after HD last night. She received NS bolus total of 750cc and clonidine was stopped in addition to other home anti-hypertensives that were held on admission. BP has since improved today. Will continue to monitor.  - continue lasix - clonidine has been discontinued  # Weakness and weight loss Patient describes weakness since beginning dialysis, approximately 2-3 months. She notes more weakness in her lower extremity than upper and has difficulty rising from bed in the mornings and reaching with her arms. Does not note any difficulty with brushing her hair or climbing stairs. She reports approximately a 70 lb weight loss and records in epic indicate a weight loss 251>>195 over past 5 months. Differential includes muscle atrophy 2/2 increased sedentary lifestyle vs. polymyositis vs. thyroid dysfunction - check TSH, free T4 - ESR, CK   #Dysphagia Patient reported difficulty swallowing on admission, especially pills. She denies any vomiting secondary to swallowing and no progression in the nature of difficulty. SLP eval was obtained and revealed mild aspiration risk with no follow-up recommendations noted. Will continue to monitor.    #DM  Patient states she no longer is on medication for her diabetes. ESRD 2/2 DM. All documented random glucose levels <200 per epic. Will continue to monitor CBG. No utility in obtaining A1C on ESRD patients as normal levels not predictive of disease activity.   #COPD  Patient has 40 pack year smoking history and is current smoker.  - will provide smoking cessation counseling   #CHF  Grade II  diastolic dysfunction with preserved EF 65-70%, via April 2014 echo. She has s/s of volume overload seen with B/L pleural effusions and increased LE edema.  - continue lasix  - 2D echo for assessment of current cardiac function  #GERD  Well controlled history of reflux with no complaints on admission  - continue PPI   #Constipation  Patient notes she developed significant constipation s/p initiation of dialysis. She takes miralax at home daily with good response. Will resume home med and monitor.  - continue miralax   This is a Psychologist, occupational Note.  The care of the patient was discussed with Dr. Otis Brace and the assessment and plan formulated with their assistance.  Please see their attached note for official documentation of the daily encounter.   LOS: 1 day   Anita [name], Med Student 07/13/2013, 10:32 AM

## 2013-07-13 NOTE — Progress Notes (Addendum)
Subjective:  Pt seen and examined in AM. Overnight, pt with hypotension after HD that responded to fluids. Today, pt states she is feeling ok. Headache is better and weakness is no better or worse. She reports difficulty rising from the chair and combing her hair. She denies fever, chills, dyspnea, CP, nausea, vomiting, abdominal pain, or lightheadedness. She has constipation at baseline. She is able to ambulate without difficulty.   Objective: Vital signs in last 24 hours: Filed Vitals:   07/13/13 0800 07/13/13 0830 07/13/13 0900 07/13/13 0930  BP: 110/63 117/56 121/82 131/72  Pulse: 28 119 124 118  Temp:      TempSrc:      Resp: 23 23    Height:      Weight:      SpO2: 87% 95% 95% 98%   Weight change:   Intake/Output Summary (Last 24 hours) at 07/13/13 1053 Last data filed at 07/13/13 0900  Gross per 24 hour  Intake    539 ml  Output   1982 ml  Net  -1443 ml    Constitutional: She appears well-developed and well-nourished. No distress. obese  HENT:  Mouth/Throat: Oropharynx is clear and moist. No oropharyngeal exudate.  Left occipital hematoma with tenderness to touch  Eyes: Conjunctivae and EOM are normal. Pupils are equal, round, and reactive to light.  Neck: Normal range of motion. Neck supple.  Cardiovascular: Normal rate. Murmur heard. Irregular rhythm  Pulmonary/Chest: Effort normal. No respiratory distress. She has no wheezes.  Poor air movement, decreased breath sound at bases R>L  Abdominal: Soft. Bowel sounds are normal.  Musculoskeletal: She exhibits edema (+3 pitting edema to knees).  Skin: She is not diaphoretic.  Abrasion on left hand and right wrist.  Skin cold to touch Neuro:  A & O x3, 4/5 UE & LE strength Psychiatric: She has a normal mood and affect. Her behavior is normal.   Lab Results: Basic Metabolic Panel:  Recent Labs Lab 07/12/13 1008  NA 134*  K 3.3*  CL 89*  CO2 24  GLUCOSE 100*  BUN 37*  CREATININE 4.73*  CALCIUM 7.8*    Liver Function Tests:  Recent Labs Lab 07/12/13 1008  AST 17  ALT 23  ALKPHOS 67  BILITOT 0.3  PROT 5.4*  ALBUMIN 2.2*   No results found for this basename: LIPASE, AMYLASE,  in the last 168 hours No results found for this basename: AMMONIA,  in the last 168 hours CBC:  Recent Labs Lab 07/12/13 1008 07/13/13 0730  WBC 7.3 6.8  NEUTROABS 5.1  --   HGB 12.3 10.9*  HCT 35.4* 31.3*  MCV 84.5 84.4  PLT 210 77*   Cardiac Enzymes:  Recent Labs Lab 07/12/13 1008  TROPONINI <0.30   BNP: No results found for this basename: PROBNP,  in the last 168 hours D-Dimer: No results found for this basename: DDIMER,  in the last 168 hours CBG:  Recent Labs Lab 07/12/13 1712  GLUCAP 83   Hemoglobin A1C: No results found for this basename: HGBA1C,  in the last 168 hours Fasting Lipid Panel: No results found for this basename: CHOL, HDL, LDLCALC, TRIG, CHOLHDL, LDLDIRECT,  in the last 168 hours Thyroid Function Tests: No results found for this basename: TSH, T4TOTAL, FREET4, T3FREE, THYROIDAB,  in the last 168 hours Coagulation:  Recent Labs Lab 07/12/13 1008  LABPROT 11.8  INR 0.88   Anemia Panel: No results found for this basename: VITAMINB12, FOLATE, FERRITIN, TIBC, IRON, RETICCTPCT,  in the  last 168 hours Urine Drug Screen: Drugs of Abuse  No results found for this basename: labopia, cocainscrnur, labbenz, amphetmu, thcu, labbarb    Alcohol Level: No results found for this basename: ETH,  in the last 168 hours Urinalysis: No results found for this basename: COLORURINE, APPERANCEUR, LABSPEC, PHURINE, GLUCOSEU, HGBUR, BILIRUBINUR, KETONESUR, PROTEINUR, UROBILINOGEN, NITRITE, LEUKOCYTESUR,  in the last 168 hours  Micro Results: No results found for this or any previous visit (from the past 240 hour(s)). Studies/Results: Ct Abdomen Pelvis Wo Contrast  07/12/2013   CLINICAL DATA:  Fall with head injury and altered level of consciousness.  EXAM: CT CHEST, ABDOMEN  AND PELVIS WITHOUT CONTRAST  TECHNIQUE: Multidetector CT imaging of the chest, abdomen and pelvis was performed following the standard protocol without IV contrast.  COMPARISON:  Multiple exams, including 07/12/2013 and 03/21/2009.  FINDINGS: CT CHEST FINDINGS  2.0 cm partially calcified nodule the right thyroid lobe.  Coronary artery atherosclerosis with calcification of the aortic valve and mild cardiomegaly. Diffuse subcutaneous and mediastinal edema.  Moderate and likely loculated right pleural effusion with small left pleural effusion. Passive atelectasis noted. Suspected centrilobular emphysema.  CT ABDOMEN AND PELVIS FINDINGS  Diffuse subcutaneous edema with low grade mesenteric edema, all increased from prior. Trace fluid in the left paracolic gutter. Stable low-density left adrenal mass compatible with adenoma. Mild renal atrophy. Pancreas unremarkable in non-contrast CT appearance.  Aortoiliac atherosclerotic vascular disease noted with scattered small retroperitoneal lymph nodes. Sigmoid diverticulosis. No dilated bowel. Small amount of free pelvic fluid.  IMPRESSION: 1. Diffuse subcutaneous, mediastinal, and mesenteric edema compatible with 3rd spacing of fluid. 2. Moderate right and small left pleural effusions. The right pleural effusion is probably loculated. 3. Right-sided thyroid nodule. Consider further evaluation with thyroid ultrasound. If patient is clinically hyperthyroid, consider nuclear medicine thyroid uptake and scan. 4. Atherosclerosis. 5. Stable left adrenal adenoma. 6. Small amount of ascites. Please note that today's exam was performed without IV contrast and accordingly does not have high sensitivity for solid organ laceration. 7. Sigmoid diverticulosis. 8. Centrilobular emphysema.   Electronically Signed   By: Herbie Baltimore M.D.   On: 07/12/2013 12:33   Dg Wrist Complete Left  07/12/2013   CLINICAL DATA:  Status post fall  EXAM: LEFT WRIST - COMPLETE 3+ VIEW  FINDINGS: There  is no evidence of fracture or dislocation. There is no evidence of arthropathy or other focal bone abnormality. Soft tissues are unremarkable. The bones are osteopenic.  IMPRESSION: Osteopenia without evidence of acute osseous abnormalities.   Electronically Signed   By: Salome Holmes M.D.   On: 07/12/2013 11:53   Ct Head Wo Contrast  07/12/2013   CLINICAL DATA:  Fall  EXAM: CT HEAD WITHOUT CONTRAST  CT CERVICAL SPINE WITHOUT CONTRAST  TECHNIQUE: Multidetector CT imaging of the head and cervical spine was performed following the standard protocol without intravenous contrast. Multiplanar CT image reconstructions of the cervical spine were also generated.  COMPARISON:  None.  FINDINGS: CT HEAD FINDINGS  No skull fracture is noted. Paranasal sinuses and mastoid air cells are unremarkable. There is skull swelling and subcutaneous stranding in left occipital region. Subcutaneous hematoma in this region measures about 2.3 cm.  No intracranial hemorrhage, mass effect or midline shift. Atherosclerotic calcifications of carotid siphon are noted. Mild cerebral atrophy is noted. Tiny lacunar infarct is noted in right basal ganglia. No acute cortical infarction. No mass lesion is noted on this unenhanced scan.  CT CERVICAL SPINE FINDINGS  Axial images of the  cervical spine shows no acute fracture or subluxation. Computer processed images shows no acute fracture or subluxation. Degenerative changes are noted C1-C2 articulation. Mild disc space flattening with anterior spurring at C5-C6 level. No prevertebral soft tissue swelling. Cervical airway is patent. There is no pneumothorax in visualized left lung apex.  IMPRESSION: 1. No acute intracranial abnormality. There is scalp swelling and subcutaneous stranding in left occipital region. Subcutaneous hematoma in left occipital region measures 2.4 cm. Mild cerebral atrophy. 2. No cervical spine acute fracture or subluxation. Mild degenerative changes.   Electronically Signed    By: Natasha Mead M.D.   On: 07/12/2013 11:22   Ct Chest Wo Contrast  07/12/2013   CLINICAL DATA:  Fall with head injury and altered level of consciousness.  EXAM: CT CHEST, ABDOMEN AND PELVIS WITHOUT CONTRAST  TECHNIQUE: Multidetector CT imaging of the chest, abdomen and pelvis was performed following the standard protocol without IV contrast.  COMPARISON:  Multiple exams, including 07/12/2013 and 03/21/2009.  FINDINGS: CT CHEST FINDINGS  2.0 cm partially calcified nodule the right thyroid lobe.  Coronary artery atherosclerosis with calcification of the aortic valve and mild cardiomegaly. Diffuse subcutaneous and mediastinal edema.  Moderate and likely loculated right pleural effusion with small left pleural effusion. Passive atelectasis noted. Suspected centrilobular emphysema.  CT ABDOMEN AND PELVIS FINDINGS  Diffuse subcutaneous edema with low grade mesenteric edema, all increased from prior. Trace fluid in the left paracolic gutter. Stable low-density left adrenal mass compatible with adenoma. Mild renal atrophy. Pancreas unremarkable in non-contrast CT appearance.  Aortoiliac atherosclerotic vascular disease noted with scattered small retroperitoneal lymph nodes. Sigmoid diverticulosis. No dilated bowel. Small amount of free pelvic fluid.  IMPRESSION: 1. Diffuse subcutaneous, mediastinal, and mesenteric edema compatible with 3rd spacing of fluid. 2. Moderate right and small left pleural effusions. The right pleural effusion is probably loculated. 3. Right-sided thyroid nodule. Consider further evaluation with thyroid ultrasound. If patient is clinically hyperthyroid, consider nuclear medicine thyroid uptake and scan. 4. Atherosclerosis. 5. Stable left adrenal adenoma. 6. Small amount of ascites. Please note that today's exam was performed without IV contrast and accordingly does not have high sensitivity for solid organ laceration. 7. Sigmoid diverticulosis. 8. Centrilobular emphysema.   Electronically  Signed   By: Herbie Baltimore M.D.   On: 07/12/2013 12:33   Ct Cervical Spine Wo Contrast  07/12/2013   CLINICAL DATA:  Fall  EXAM: CT HEAD WITHOUT CONTRAST  CT CERVICAL SPINE WITHOUT CONTRAST  TECHNIQUE: Multidetector CT imaging of the head and cervical spine was performed following the standard protocol without intravenous contrast. Multiplanar CT image reconstructions of the cervical spine were also generated.  COMPARISON:  None.  FINDINGS: CT HEAD FINDINGS  No skull fracture is noted. Paranasal sinuses and mastoid air cells are unremarkable. There is skull swelling and subcutaneous stranding in left occipital region. Subcutaneous hematoma in this region measures about 2.3 cm.  No intracranial hemorrhage, mass effect or midline shift. Atherosclerotic calcifications of carotid siphon are noted. Mild cerebral atrophy is noted. Tiny lacunar infarct is noted in right basal ganglia. No acute cortical infarction. No mass lesion is noted on this unenhanced scan.  CT CERVICAL SPINE FINDINGS  Axial images of the cervical spine shows no acute fracture or subluxation. Computer processed images shows no acute fracture or subluxation. Degenerative changes are noted C1-C2 articulation. Mild disc space flattening with anterior spurring at C5-C6 level. No prevertebral soft tissue swelling. Cervical airway is patent. There is no pneumothorax in visualized left lung apex.  IMPRESSION: 1. No acute intracranial abnormality. There is scalp swelling and subcutaneous stranding in left occipital region. Subcutaneous hematoma in left occipital region measures 2.4 cm. Mild cerebral atrophy. 2. No cervical spine acute fracture or subluxation. Mild degenerative changes.   Electronically Signed   By: Natasha Mead M.D.   On: 07/12/2013 11:22   Dg Pelvis Portable  07/12/2013   CLINICAL DATA:  Fall, weakness and pain.  EXAM: PORTABLE PELVIS 1-2 VIEWS  COMPARISON:  CT abdomen and pelvis 03/21/2009.  FINDINGS: There is no evidence of pelvic  fracture or diastasis. No other pelvic bone lesions are seen.  IMPRESSION: Negative exam.   Electronically Signed   By: Drusilla Kanner M.D.   On: 07/12/2013 11:09   Dg Chest Portable 1 View  07/12/2013   CLINICAL DATA:  Status post fall now with weakness history of dialysis dependent renal failure, and diabetes.  EXAM: PORTABLE CHEST - 1 VIEW  COMPARISON:  Dec 28, 2012.  FINDINGS: There is new blunting of the right lateral costophrenic angle. There is no pneumothorax. The observed portions of the overlying ribs appear normal. The left lung is well expanded and clear. The cardiac silhouette is mildly enlarged though stable. The pulmonary vascularity is somewhat indistinct on the right.  IMPRESSION: There is a new right pleural effusion. This may be secondary to the patient's underlying cardiac and renal disease or reflect post traumatic processes. As best as can be determined the right ribs are intact. Followup chest CT scanning may be useful if the patient has experienced significant thoracic trauma.   Electronically Signed   By: David  [place]   On: 07/12/2013 11:07   Dg Humerus Left  07/12/2013   CLINICAL DATA:  Status post fall  EXAM: LEFT HUMERUS - 2+ VIEW  COMPARISON:  None.  FINDINGS: There is no evidence of fracture or other focal bone lesions. Soft tissues are unremarkable.  IMPRESSION: Negative.   Electronically Signed   By: Salome Holmes M.D.   On: 07/12/2013 11:54   Dg Hand Complete Left  07/12/2013   CLINICAL DATA:  Status post fall  EXAM: LEFT HAND - COMPLETE 3+ VIEW  COMPARISON:  None.  FINDINGS: The bones are osteopenic. Mild osteoarthritic changes identified within the proximal and distal interphalangeal joints. There is no evidence of acute fracture nor dislocation.  IMPRESSION: 1. Mild osteoarthritic changes. 2. No evidence of acute osseous abnormalities.   Electronically Signed   By: Salome Holmes M.D.   On: 07/12/2013 11:48   Medications: I have reviewed the patient's current  medications. Scheduled Meds: . calcitRIOL  0.25 mcg Oral Q M,W,F  . calcium acetate  667 mg Oral TID WC  . furosemide  80 mg Oral BID  . pantoprazole  40 mg Oral Daily  . polyethylene glycol  17 g Oral Daily  . sodium chloride  3 mL Intravenous Q12H  . zolpidem  5 mg Oral QHS   Continuous Infusions:  PRN Meds:.albuterol, HYDROcodone-acetaminophen Assessment/Plan: Principal Problem:   Dyspnea Active Problems:   Accidental fall on or from sidewalk curb   Weakness  Assessment: 62 year old female with past medical history of ESRD on HD (TTS) since 03/2013, HTN, HL, T2DM, COPD, diastolic CHF, OSA, and GERD who presented on 11/25 with recent fall and found to be in atrial fibrillation with RVR and hypoxic with right loculated pleural effusion.   Atrial fibrillation with RVR - Currently rate controlled. Pt with 1st episode of Afib with RVR on 12-lead EKG not present from  12/29/12. Etiology unknown. Due to symptoms of thyroid dysfunction (cold, constipation, wt change, dry skin, dysphagia) and presence of 2.0 cm partially calcified right sided thyroid nodule, thyroid disease possible. Pt also with HTN, grade II diastolic HF, aortic stenosis, and OSA. No reports of alcohol or drug use. Pt received 5 mg IV diltiazem for rate control and was started on heparin for AC. Due to drop in platelets (11/25) (most likely lab error), heparin stopped on 11/26.  If paroxysmal should self-terminate in <48 hr.  -Monitor on telemetry  -Obtain TSH, free T4  -Per cardiology start metoprolol 25 mg BID, consider diltiazem if uncontrolled ( HR> 110 at rest)  -Appreciate cardiology consult --> Following anticoagulation for 3-4 weeks consider initiation of amiodarone followed by cardioversion if she fails to convert back to sinus rhythm -Due to CHADS2 score 4 ---> needs long term AC --> start coumadin   -Obtain 2-D Echo -Obtain records from previous cardiology 5 years ago  Thrombocytopenia -resolved. Most likely due to  lab error vs HIT syndrome  - Obtain HIT panel   Hypoxia in setting of right loculated pleural effusion & volume overload - improved oxygenation (no longer on supplemental oxygen) Pt not on home oxygen for COPD. Due to low SpO2 pt requiring supplemental oxygen (4L) with SpO2 95-100%. Moderate and likely loculated right pleural effusion with small left. Pt with diastolic heart failure without atypical features therefore diagnostic thoracentesis probably not required. Pt with volume overload on exam. She is on HD due to ESRD.  -Oxygen therapy to keep SpO2>92%  -HD again  today for volume removal  -Repeat CXR   End Stage Renal Disease on HD - Pt on HD since August of this year. Pt is oliguric (occasional leaks). Pt with recent problem with clotting of fistula. She follows with ESRD on dialysis at the [facility] on TTS schedule. She reports chronic LE edema and on diuretic therapy without improvement. Pt had HD on 11/25 with 1.9L out.  -Monitor weight --> 200 to 205lb -Daily I&Os  --> 1.9L out  -Renal diet  -Continue Phoslo, calcitrol  -Start RENA-Vit -HD today on TTS schedule   Mechanical Fall with head injury and resulting hematoma due to weakness -  CT head negative for acute intracranial abnormality but evidence of subcutaneous hematoma (2.3 cm) in left occipital region. CT cervical spine without acute fracture or subluxation. XR left hand, humerus, and wrist without evidence of fracture or other focal bone lesions.  -Continue home norco Q6 PRN pain  -Obtain CK level --> WNL  -Obtain ESR ---> WNL  Thyroid Nodule - Pt with evidence of 2.0 cm partially calcified nodule the right thyroid lobe with past history of benign thyroid mass removal.  -Obtain TSH, free T4  -Monitor outpatient  Hypertension - Pt on home atenolol, clonidine, hydralazine and lisinopril. Pt with hypotension after HD.  -Hold atenolol 50 mg BID  -Hold lisinopril 20 mg BID  -Hold hydralazine 25 mg BID  -Hold  clonidine 0.2 mg BID   COPD - Pt reports last PFT many years ago (no records on file). Pt with evidence of centrilobular emphysema on CT imaging. No history of intubation, recent exacerbation or steroids/antiobitcs. Pt reports she does not use any inhalers at home. No evidence of wheezing on exam and denies dyspnea, cough, and chest tightness.  -Albuterol PRN bronchospasm  -Outpatient PFT  -Counsel on smoking cessation  Grade II Diastolic Heart Failure - Pt with last 2D-Echo on 12/03/12 with evidence of Grade  2 diastolic dysfunction with normal EF (65-70%) and no wall motion abnormalities. Also with mild to moderate aortic stenosis (possibly bicuspid). Pt reports she is oliguric on HD  and compliant with diuretic therapy however still with volume overload.  -Continue home PO 80 mg lasix  -Obtain 2D- echo  Complicated Diabetes Mellitus - Stable. No record of HbA1c on file. Complicated with peripheral neuropathy and nephropathy with progression to ESRD.  -Monitor glucose   Hypoalbuminemia - Pt with volume overload not responsive to HD. Liver function appears normal. No UA available due to oliguria.  -Obtain pre-albumin -Nutrition consult   Normocytic Anemia - Stable, baseline near 10. Most likely due to CKD. No reports of GI or GU bleeding. Last colonoscopy unknown. -Obtain anemia panel   -Outpatient colonoscopy if not within last10 yrs   Insomnia - Pt reports insomnia at home.  -Continue home zolpidem 5 mg daily   GERD - Pt on home omeprazole 20 mg daily.  -Continue protonix 40 mg daily   Constipation - Pt reports constipation for past several month. She is on miralax at home.  -Continue home miralax   OSA - Pt with past use of CPAP at home but due to weight loss no longer uses it at night.  -CPAP at night as needed   Code: Full  Diet: Renal  DVT Ppx: Heparin       Dispo: Disposition is deferred at this time, awaiting improvement of current medical problems.  Anticipated  discharge in approximately 2-3 day(s).   The patient does have a current PCP Milana Obey, MD) and does need an [facility] hospital follow-up appointment after discharge.  The patient does not have transportation limitations that hinder transportation to clinic appointments.  .Services Needed at time of discharge: Y = Yes, Blank = No PT:   OT:   RN:   Equipment:   Other:     LOS: 1 day   Otis Brace, MD 07/13/2013, 10:53 AM

## 2013-07-14 ENCOUNTER — Inpatient Hospital Stay (HOSPITAL_COMMUNITY): Payer: Medicare Other

## 2013-07-14 DIAGNOSIS — I4891 Unspecified atrial fibrillation: Principal | ICD-10-CM

## 2013-07-14 DIAGNOSIS — I369 Nonrheumatic tricuspid valve disorder, unspecified: Secondary | ICD-10-CM

## 2013-07-14 DIAGNOSIS — I2510 Atherosclerotic heart disease of native coronary artery without angina pectoris: Secondary | ICD-10-CM

## 2013-07-14 DIAGNOSIS — I5032 Chronic diastolic (congestive) heart failure: Secondary | ICD-10-CM

## 2013-07-14 DIAGNOSIS — I359 Nonrheumatic aortic valve disorder, unspecified: Secondary | ICD-10-CM

## 2013-07-14 MED ORDER — DILTIAZEM LOAD VIA INFUSION
10.0000 mg | Freq: Once | INTRAVENOUS | Status: AC
  Administered 2013-07-14: 10 mg via INTRAVENOUS
  Filled 2013-07-14: qty 10

## 2013-07-14 MED ORDER — DILTIAZEM HCL 100 MG IV SOLR
5.0000 mg/h | INTRAVENOUS | Status: DC
  Administered 2013-07-14 (×2): 5 mg/h via INTRAVENOUS
  Administered 2013-07-15 – 2013-07-16 (×3): 10 mg/h via INTRAVENOUS
  Filled 2013-07-14 (×5): qty 100

## 2013-07-14 MED ORDER — ZOLPIDEM TARTRATE 5 MG PO TABS
10.0000 mg | ORAL_TABLET | Freq: Every day | ORAL | Status: DC
  Administered 2013-07-14 – 2013-07-17 (×4): 10 mg via ORAL
  Filled 2013-07-14 (×4): qty 2

## 2013-07-14 MED ORDER — WARFARIN SODIUM 5 MG PO TABS
5.0000 mg | ORAL_TABLET | Freq: Once | ORAL | Status: AC
  Administered 2013-07-14: 5 mg via ORAL
  Filled 2013-07-14: qty 1

## 2013-07-14 NOTE — Progress Notes (Signed)
Assessment:  1 ESRD w/ IDLHypotension  2 Volume overload  3 s/p Fall  4 New onset Afib  5 Generalized weakness/FTT  6 Acute thrombocytopenia was lab error Plan:  1 Hemodialysis Friday. Getting Cardizem drip now.   -1600cc removed with dialysis yesterday with BP drop to 90/45.  Cardilology feels a fib responsible for lack of ability to maintain BP due to loss of atrial kick.  Subjective: Interval History: Worried about BP drops with HD  Objective: Vital signs in last 24 hours: Temp:  [97.8 F (36.6 C)-98.6 F (37 C)] 97.8 F (36.6 C) (11/27 0926) Pulse Rate:  [52-122] 93 (11/27 0800) Resp:  [13-35] 33 (11/27 0800) BP: (77-143)/(5-89) 130/89 mmHg (11/27 0800) SpO2:  [74 %-100 %] 93 % (11/27 0800) Weight:  [92.1 kg (203 lb 0.7 oz)-93.4 kg (205 lb 14.6 oz)] 92.1 kg (203 lb 0.7 oz) (11/26 1704) Weight change: 2.68 kg (5 lb 14.6 oz)  Intake/Output from previous day: 11/26 0701 - 11/27 0700 In: 168 [P.O.:120; I.V.:48] Out: 1678  Intake/Output this shift: Total I/O In: 240 [P.O.:240] Out: -   General appearance: alert and cooperative Resp: clear to auscultation bilaterally Cardio: irregularly irregular rhythm Extremities: edema 2+  Lab Results:  Recent Labs  07/13/13 0730 07/13/13 1315  WBC 6.8 5.7  HGB 10.9* 10.0*  HCT 31.3* 28.3*  PLT 77* 160   BMET:  Recent Labs  07/12/13 1008  NA 134*  K 3.3*  CL 89*  CO2 24  GLUCOSE 100*  BUN 37*  CREATININE 4.73*  CALCIUM 7.8*   No results found for this basename: PTH,  in the last 72 hours Iron Studies: No results found for this basename: IRON, TIBC, TRANSFERRIN, FERRITIN,  in the last 72 hours Studies/Results: Ct Abdomen Pelvis Wo Contrast  07/12/2013   CLINICAL DATA:  Fall with head injury and altered level of consciousness.  EXAM: CT CHEST, ABDOMEN AND PELVIS WITHOUT CONTRAST  TECHNIQUE: Multidetector CT imaging of the chest, abdomen and pelvis was performed following the standard protocol without IV contrast.   COMPARISON:  Multiple exams, including 07/12/2013 and 03/21/2009.  FINDINGS: CT CHEST FINDINGS  2.0 cm partially calcified nodule the right thyroid lobe.  Coronary artery atherosclerosis with calcification of the aortic valve and mild cardiomegaly. Diffuse subcutaneous and mediastinal edema.  Moderate and likely loculated right pleural effusion with small left pleural effusion. Passive atelectasis noted. Suspected centrilobular emphysema.  CT ABDOMEN AND PELVIS FINDINGS  Diffuse subcutaneous edema with low grade mesenteric edema, all increased from prior. Trace fluid in the left paracolic gutter. Stable low-density left adrenal mass compatible with adenoma. Mild renal atrophy. Pancreas unremarkable in non-contrast CT appearance.  Aortoiliac atherosclerotic vascular disease noted with scattered small retroperitoneal lymph nodes. Sigmoid diverticulosis. No dilated bowel. Small amount of free pelvic fluid.  IMPRESSION: 1. Diffuse subcutaneous, mediastinal, and mesenteric edema compatible with 3rd spacing of fluid. 2. Moderate right and small left pleural effusions. The right pleural effusion is probably loculated. 3. Right-sided thyroid nodule. Consider further evaluation with thyroid ultrasound. If patient is clinically hyperthyroid, consider nuclear medicine thyroid uptake and scan. 4. Atherosclerosis. 5. Stable left adrenal adenoma. 6. Small amount of ascites. Please note that today's exam was performed without IV contrast and accordingly does not have high sensitivity for solid organ laceration. 7. Sigmoid diverticulosis. 8. Centrilobular emphysema.   Electronically Signed   By: Herbie Baltimore M.D.   On: 07/12/2013 12:33   Dg Chest 2 View  07/14/2013   CLINICAL DATA:  Recent fall  with head injury and altered level of consciousness. Right pleural effusion.  EXAM: CHEST  2 VIEW  COMPARISON:  CT and radiographs 07/12/2013.  FINDINGS: The loculated right pleural effusion does not appear significantly changed.  Adjacent right basilar airspace disease is stable. The left lung is clear. An azygos fissure is noted. The heart size and mediastinal contours are stable with aortic atherosclerosis  IMPRESSION: No significant change in loculated right pleural effusion and adjacent right basilar airspace disease.   Electronically Signed   By: Roxy Horseman M.D.   On: 07/14/2013 08:30   Dg Wrist Complete Left  07/12/2013   CLINICAL DATA:  Status post fall  EXAM: LEFT WRIST - COMPLETE 3+ VIEW  FINDINGS: There is no evidence of fracture or dislocation. There is no evidence of arthropathy or other focal bone abnormality. Soft tissues are unremarkable. The bones are osteopenic.  IMPRESSION: Osteopenia without evidence of acute osseous abnormalities.   Electronically Signed   By: Salome Holmes M.D.   On: 07/12/2013 11:53   Ct Head Wo Contrast  07/12/2013   CLINICAL DATA:  Fall  EXAM: CT HEAD WITHOUT CONTRAST  CT CERVICAL SPINE WITHOUT CONTRAST  TECHNIQUE: Multidetector CT imaging of the head and cervical spine was performed following the standard protocol without intravenous contrast. Multiplanar CT image reconstructions of the cervical spine were also generated.  COMPARISON:  None.  FINDINGS: CT HEAD FINDINGS  No skull fracture is noted. Paranasal sinuses and mastoid air cells are unremarkable. There is skull swelling and subcutaneous stranding in left occipital region. Subcutaneous hematoma in this region measures about 2.3 cm.  No intracranial hemorrhage, mass effect or midline shift. Atherosclerotic calcifications of carotid siphon are noted. Mild cerebral atrophy is noted. Tiny lacunar infarct is noted in right basal ganglia. No acute cortical infarction. No mass lesion is noted on this unenhanced scan.  CT CERVICAL SPINE FINDINGS  Axial images of the cervical spine shows no acute fracture or subluxation. Computer processed images shows no acute fracture or subluxation. Degenerative changes are noted C1-C2 articulation. Mild  disc space flattening with anterior spurring at C5-C6 level. No prevertebral soft tissue swelling. Cervical airway is patent. There is no pneumothorax in visualized left lung apex.  IMPRESSION: 1. No acute intracranial abnormality. There is scalp swelling and subcutaneous stranding in left occipital region. Subcutaneous hematoma in left occipital region measures 2.4 cm. Mild cerebral atrophy. 2. No cervical spine acute fracture or subluxation. Mild degenerative changes.   Electronically Signed   By: Natasha Mead M.D.   On: 07/12/2013 11:22   Ct Chest Wo Contrast  07/12/2013   CLINICAL DATA:  Fall with head injury and altered level of consciousness.  EXAM: CT CHEST, ABDOMEN AND PELVIS WITHOUT CONTRAST  TECHNIQUE: Multidetector CT imaging of the chest, abdomen and pelvis was performed following the standard protocol without IV contrast.  COMPARISON:  Multiple exams, including 07/12/2013 and 03/21/2009.  FINDINGS: CT CHEST FINDINGS  2.0 cm partially calcified nodule the right thyroid lobe.  Coronary artery atherosclerosis with calcification of the aortic valve and mild cardiomegaly. Diffuse subcutaneous and mediastinal edema.  Moderate and likely loculated right pleural effusion with small left pleural effusion. Passive atelectasis noted. Suspected centrilobular emphysema.  CT ABDOMEN AND PELVIS FINDINGS  Diffuse subcutaneous edema with low grade mesenteric edema, all increased from prior. Trace fluid in the left paracolic gutter. Stable low-density left adrenal mass compatible with adenoma. Mild renal atrophy. Pancreas unremarkable in non-contrast CT appearance.  Aortoiliac atherosclerotic vascular disease noted with scattered  small retroperitoneal lymph nodes. Sigmoid diverticulosis. No dilated bowel. Small amount of free pelvic fluid.  IMPRESSION: 1. Diffuse subcutaneous, mediastinal, and mesenteric edema compatible with 3rd spacing of fluid. 2. Moderate right and small left pleural effusions. The right pleural  effusion is probably loculated. 3. Right-sided thyroid nodule. Consider further evaluation with thyroid ultrasound. If patient is clinically hyperthyroid, consider nuclear medicine thyroid uptake and scan. 4. Atherosclerosis. 5. Stable left adrenal adenoma. 6. Small amount of ascites. Please note that today's exam was performed without IV contrast and accordingly does not have high sensitivity for solid organ laceration. 7. Sigmoid diverticulosis. 8. Centrilobular emphysema.   Electronically Signed   By: Herbie Baltimore M.D.   On: 07/12/2013 12:33   Ct Cervical Spine Wo Contrast  07/12/2013   CLINICAL DATA:  Fall  EXAM: CT HEAD WITHOUT CONTRAST  CT CERVICAL SPINE WITHOUT CONTRAST  TECHNIQUE: Multidetector CT imaging of the head and cervical spine was performed following the standard protocol without intravenous contrast. Multiplanar CT image reconstructions of the cervical spine were also generated.  COMPARISON:  None.  FINDINGS: CT HEAD FINDINGS  No skull fracture is noted. Paranasal sinuses and mastoid air cells are unremarkable. There is skull swelling and subcutaneous stranding in left occipital region. Subcutaneous hematoma in this region measures about 2.3 cm.  No intracranial hemorrhage, mass effect or midline shift. Atherosclerotic calcifications of carotid siphon are noted. Mild cerebral atrophy is noted. Tiny lacunar infarct is noted in right basal ganglia. No acute cortical infarction. No mass lesion is noted on this unenhanced scan.  CT CERVICAL SPINE FINDINGS  Axial images of the cervical spine shows no acute fracture or subluxation. Computer processed images shows no acute fracture or subluxation. Degenerative changes are noted C1-C2 articulation. Mild disc space flattening with anterior spurring at C5-C6 level. No prevertebral soft tissue swelling. Cervical airway is patent. There is no pneumothorax in visualized left lung apex.  IMPRESSION: 1. No acute intracranial abnormality. There is scalp  swelling and subcutaneous stranding in left occipital region. Subcutaneous hematoma in left occipital region measures 2.4 cm. Mild cerebral atrophy. 2. No cervical spine acute fracture or subluxation. Mild degenerative changes.   Electronically Signed   By: Natasha Mead M.D.   On: 07/12/2013 11:22   Dg Pelvis Portable  07/12/2013   CLINICAL DATA:  Fall, weakness and pain.  EXAM: PORTABLE PELVIS 1-2 VIEWS  COMPARISON:  CT abdomen and pelvis 03/21/2009.  FINDINGS: There is no evidence of pelvic fracture or diastasis. No other pelvic bone lesions are seen.  IMPRESSION: Negative exam.   Electronically Signed   By: Drusilla Kanner M.D.   On: 07/12/2013 11:09   Dg Chest Portable 1 View  07/12/2013   CLINICAL DATA:  Status post fall now with weakness history of dialysis dependent renal failure, and diabetes.  EXAM: PORTABLE CHEST - 1 VIEW  COMPARISON:  Dec 28, 2012.  FINDINGS: There is new blunting of the right lateral costophrenic angle. There is no pneumothorax. The observed portions of the overlying ribs appear normal. The left lung is well expanded and clear. The cardiac silhouette is mildly enlarged though stable. The pulmonary vascularity is somewhat indistinct on the right.  IMPRESSION: There is a new right pleural effusion. This may be secondary to the patient's underlying cardiac and renal disease or reflect post traumatic processes. As best as can be determined the right ribs are intact. Followup chest CT scanning may be useful if the patient has experienced significant thoracic trauma.   Electronically  Signed   By: David  [place]   On: 07/12/2013 11:07   Dg Humerus Left  07/12/2013   CLINICAL DATA:  Status post fall  EXAM: LEFT HUMERUS - 2+ VIEW  COMPARISON:  None.  FINDINGS: There is no evidence of fracture or other focal bone lesions. Soft tissues are unremarkable.  IMPRESSION: Negative.   Electronically Signed   By: Salome Holmes M.D.   On: 07/12/2013 11:54   Dg Hand Complete Left  07/12/2013    CLINICAL DATA:  Status post fall  EXAM: LEFT HAND - COMPLETE 3+ VIEW  COMPARISON:  None.  FINDINGS: The bones are osteopenic. Mild osteoarthritic changes identified within the proximal and distal interphalangeal joints. There is no evidence of acute fracture nor dislocation.  IMPRESSION: 1. Mild osteoarthritic changes. 2. No evidence of acute osseous abnormalities.   Electronically Signed   By: Salome Holmes M.D.   On: 07/12/2013 11:48   Scheduled: . albumin human  25 g Intravenous Once  . calcitRIOL  0.25 mcg Oral Q M,W,F  . calcium acetate  667 mg Oral TID WC  . diltiazem  10 mg Intravenous Once  . feeding supplement (NEPRO CARB STEADY)  237 mL Oral BID BM  . furosemide  80 mg Oral BID  . metoprolol tartrate  25 mg Oral BID  . multivitamin  1 tablet Oral QHS  . pantoprazole  40 mg Oral Daily  . polyethylene glycol  17 g Oral Daily  . sodium chloride  3 mL Intravenous Q12H  . warfarin   Does not apply Once  . Warfarin - Pharmacist Dosing Inpatient   Does not apply q1800  . zolpidem  5 mg Oral QHS     LOS: 2 days   Lakeasha Petion C 07/14/2013,10:12 AM

## 2013-07-14 NOTE — Progress Notes (Signed)
Phlebotomists unable to draw this AM labs d/t limited access. MD notified

## 2013-07-14 NOTE — Progress Notes (Signed)
ANTICOAGULATION CONSULT NOTE - Follow Up Consult  Pharmacy Consult for coumadin Indication: atrial fibrillation  Allergies  Allergen Reactions  . Doxycycline Nausea And Vomiting  . Lipitor [Atorvastatin] Nausea And Vomiting    Patient Measurements: Height: 5' 6.5" (168.9 cm) Weight: 203 lb 0.7 oz (92.1 kg) IBW/kg (Calculated) : 60.45  Vital Signs: Temp: 97.8 F (36.6 C) (11/27 0926) Temp src: Oral (11/27 0926) BP: 116/67 mmHg (11/27 1115) Pulse Rate: 73 (11/27 1115)  Labs:  Recent Labs  07/12/13 1008 07/13/13 0730 07/13/13 1030 07/13/13 1200 07/13/13 1315  HGB 12.3 10.9*  --   --  10.0*  HCT 35.4* 31.3*  --   --  28.3*  PLT 210 77*  --   --  160  LABPROT 11.8  --   --   --   --   INR 0.88  --   --   --   --   HEPARINUNFRC  --   --   --  0.21*  --   CREATININE 4.73*  --   --   --   --   CKTOTAL  --   --  48  --   --   TROPONINI <0.30  --   --   --   --     Estimated Creatinine Clearance: 14 ml/min (by C-G formula based on Cr of 4.73).   Medications:  Scheduled:  . albumin human  25 g Intravenous Once  . calcitRIOL  0.25 mcg Oral Q M,W,F  . calcium acetate  667 mg Oral TID WC  . feeding supplement (NEPRO CARB STEADY)  237 mL Oral BID BM  . furosemide  80 mg Oral BID  . metoprolol tartrate  25 mg Oral BID  . multivitamin  1 tablet Oral QHS  . pantoprazole  40 mg Oral Daily  . polyethylene glycol  17 g Oral Daily  . sodium chloride  3 mL Intravenous Q12H  . warfarin   Does not apply Once  . Warfarin - Pharmacist Dosing Inpatient   Does not apply q1800  . zolpidem  5 mg Oral QHS    Assessment: 62 yo female with afib to start coumadin. Baseline INR= 0.88. Platelet count of 77 noted this am and last platelet count was 210 on 07/12/13. A repeat platelet count today was 160 (prior lab was likely error).  CHADS-VASC score= 4.  Patient received warfarin 5mg  yesterday.  Labs could not be obtained today due to poor access.  MD has been notified. Goal of Therapy:   INR 2-3 Monitor platelets by anticoagulation protocol: Yes   Plan:  -Coumadin 5mg  po today -Daily PT/INR   Celedonio Miyamoto, PharmD, BCPS Clinical Pharmacist Pager [phone]   07/14/2013 1:22 PM

## 2013-07-14 NOTE — Progress Notes (Signed)
SUBJECTIVE:  Wants to go home but HR still 130's  OBJECTIVE:   Vitals:   Filed Vitals:   07/13/13 2021 07/13/13 2300 07/14/13 0020 07/14/13 0435  BP: 77/47 90/45 94/71  112/77  Pulse: 122 106 111 114  Temp: 97.9 F (36.6 C)  98 F (36.7 C) 98.6 F (37 C)  TempSrc: Oral  Oral Oral  Resp: 18 17 14 18   Height:      Weight:      SpO2: 97% 100% 100% 95%   I&O's:   Intake/Output Summary (Last 24 hours) at 07/14/13 0805 Last data filed at 07/13/13 2206  Gross per 24 hour  Intake    156 ml  Output   1678 ml  Net  -1522 ml   TELEMETRY: Reviewed telemetry pt in afib with RVR at 1:     PHYSICAL EXAM General: Well developed, well nourished, in no acute distress Head: Eyes PERRLA, No xanthomas.   Normal cephalic and atramatic  Lungs:   Clear bilaterally to auscultation and percussion. Heart:   irregularly irregular and tachy S1 S2 Pulses are 2+ & equal. Abdomen: Bowel sounds are positive, abdomen soft and non-tender without masses  Extremities:   No clubbing, cyanosis or edema.  DP +1 Neuro: Alert and oriented X 3. Psych:  Good affect, responds appropriately   LABS: Basic Metabolic Panel:  Recent Labs  40/98/11 1008  NA 134*  K 3.3*  CL 89*  CO2 24  GLUCOSE 100*  BUN 37*  CREATININE 4.73*  CALCIUM 7.8*   Liver Function Tests:  Recent Labs  07/12/13 1008  AST 17  ALT 23  ALKPHOS 67  BILITOT 0.3  PROT 5.4*  ALBUMIN 2.2*   No results found for this basename: LIPASE, AMYLASE,  in the last 72 hours CBC:  Recent Labs  07/12/13 1008 07/13/13 0730 07/13/13 1315  WBC 7.3 6.8 5.7  NEUTROABS 5.1  --   --   HGB 12.3 10.9* 10.0*  HCT 35.4* 31.3* 28.3*  MCV 84.5 84.4 84.5  PLT 210 77* 160   Cardiac Enzymes:  Recent Labs  07/12/13 1008 07/13/13 1030  CKTOTAL  --  48  TROPONINI <0.30  --    BNP: No components found with this basename: POCBNP,  D-Dimer: No results found for this basename: DDIMER,  in the last 72 hours Hemoglobin A1C: No results  found for this basename: HGBA1C,  in the last 72 hours Fasting Lipid Panel: No results found for this basename: CHOL, HDL, LDLCALC, TRIG, CHOLHDL, LDLDIRECT,  in the last 72 hours Thyroid Function Tests:  Recent Labs  07/13/13 1030  TSH 4.126   Anemia Panel: No results found for this basename: VITAMINB12, FOLATE, FERRITIN, TIBC, IRON, RETICCTPCT,  in the last 72 hours Coag Panel:   Lab Results  Component Value Date   INR 0.88 07/12/2013    RADIOLOGY: Ct Abdomen Pelvis Wo Contrast  07/12/2013   CLINICAL DATA:  Fall with head injury and altered level of consciousness.  EXAM: CT CHEST, ABDOMEN AND PELVIS WITHOUT CONTRAST  TECHNIQUE: Multidetector CT imaging of the chest, abdomen and pelvis was performed following the standard protocol without IV contrast.  COMPARISON:  Multiple exams, including 07/12/2013 and 03/21/2009.  FINDINGS: CT CHEST FINDINGS  2.0 cm partially calcified nodule the right thyroid lobe.  Coronary artery atherosclerosis with calcification of the aortic valve and mild cardiomegaly. Diffuse subcutaneous and mediastinal edema.  Moderate and likely loculated right pleural effusion with small left pleural effusion. Passive atelectasis noted. Suspected centrilobular  emphysema.  CT ABDOMEN AND PELVIS FINDINGS  Diffuse subcutaneous edema with low grade mesenteric edema, all increased from prior. Trace fluid in the left paracolic gutter. Stable low-density left adrenal mass compatible with adenoma. Mild renal atrophy. Pancreas unremarkable in non-contrast CT appearance.  Aortoiliac atherosclerotic vascular disease noted with scattered small retroperitoneal lymph nodes. Sigmoid diverticulosis. No dilated bowel. Small amount of free pelvic fluid.  IMPRESSION: 1. Diffuse subcutaneous, mediastinal, and mesenteric edema compatible with 3rd spacing of fluid. 2. Moderate right and small left pleural effusions. The right pleural effusion is probably loculated. 3. Right-sided thyroid nodule.  Consider further evaluation with thyroid ultrasound. If patient is clinically hyperthyroid, consider nuclear medicine thyroid uptake and scan. 4. Atherosclerosis. 5. Stable left adrenal adenoma. 6. Small amount of ascites. Please note that today's exam was performed without IV contrast and accordingly does not have high sensitivity for solid organ laceration. 7. Sigmoid diverticulosis. 8. Centrilobular emphysema.   Electronically Signed   By: Herbie Baltimore M.D.   On: 07/12/2013 12:33   Dg Wrist Complete Left  07/12/2013   CLINICAL DATA:  Status post fall  EXAM: LEFT WRIST - COMPLETE 3+ VIEW  FINDINGS: There is no evidence of fracture or dislocation. There is no evidence of arthropathy or other focal bone abnormality. Soft tissues are unremarkable. The bones are osteopenic.  IMPRESSION: Osteopenia without evidence of acute osseous abnormalities.   Electronically Signed   By: Salome Holmes M.D.   On: 07/12/2013 11:53   Ct Head Wo Contrast  07/12/2013   CLINICAL DATA:  Fall  EXAM: CT HEAD WITHOUT CONTRAST  CT CERVICAL SPINE WITHOUT CONTRAST  TECHNIQUE: Multidetector CT imaging of the head and cervical spine was performed following the standard protocol without intravenous contrast. Multiplanar CT image reconstructions of the cervical spine were also generated.  COMPARISON:  None.  FINDINGS: CT HEAD FINDINGS  No skull fracture is noted. Paranasal sinuses and mastoid air cells are unremarkable. There is skull swelling and subcutaneous stranding in left occipital region. Subcutaneous hematoma in this region measures about 2.3 cm.  No intracranial hemorrhage, mass effect or midline shift. Atherosclerotic calcifications of carotid siphon are noted. Mild cerebral atrophy is noted. Tiny lacunar infarct is noted in right basal ganglia. No acute cortical infarction. No mass lesion is noted on this unenhanced scan.  CT CERVICAL SPINE FINDINGS  Axial images of the cervical spine shows no acute fracture or subluxation.  Computer processed images shows no acute fracture or subluxation. Degenerative changes are noted C1-C2 articulation. Mild disc space flattening with anterior spurring at C5-C6 level. No prevertebral soft tissue swelling. Cervical airway is patent. There is no pneumothorax in visualized left lung apex.  IMPRESSION: 1. No acute intracranial abnormality. There is scalp swelling and subcutaneous stranding in left occipital region. Subcutaneous hematoma in left occipital region measures 2.4 cm. Mild cerebral atrophy. 2. No cervical spine acute fracture or subluxation. Mild degenerative changes.   Electronically Signed   By: Natasha Mead M.D.   On: 07/12/2013 11:22   Ct Chest Wo Contrast  07/12/2013   CLINICAL DATA:  Fall with head injury and altered level of consciousness.  EXAM: CT CHEST, ABDOMEN AND PELVIS WITHOUT CONTRAST  TECHNIQUE: Multidetector CT imaging of the chest, abdomen and pelvis was performed following the standard protocol without IV contrast.  COMPARISON:  Multiple exams, including 07/12/2013 and 03/21/2009.  FINDINGS: CT CHEST FINDINGS  2.0 cm partially calcified nodule the right thyroid lobe.  Coronary artery atherosclerosis with calcification of the aortic valve  and mild cardiomegaly. Diffuse subcutaneous and mediastinal edema.  Moderate and likely loculated right pleural effusion with small left pleural effusion. Passive atelectasis noted. Suspected centrilobular emphysema.  CT ABDOMEN AND PELVIS FINDINGS  Diffuse subcutaneous edema with low grade mesenteric edema, all increased from prior. Trace fluid in the left paracolic gutter. Stable low-density left adrenal mass compatible with adenoma. Mild renal atrophy. Pancreas unremarkable in non-contrast CT appearance.  Aortoiliac atherosclerotic vascular disease noted with scattered small retroperitoneal lymph nodes. Sigmoid diverticulosis. No dilated bowel. Small amount of free pelvic fluid.  IMPRESSION: 1. Diffuse subcutaneous, mediastinal, and  mesenteric edema compatible with 3rd spacing of fluid. 2. Moderate right and small left pleural effusions. The right pleural effusion is probably loculated. 3. Right-sided thyroid nodule. Consider further evaluation with thyroid ultrasound. If patient is clinically hyperthyroid, consider nuclear medicine thyroid uptake and scan. 4. Atherosclerosis. 5. Stable left adrenal adenoma. 6. Small amount of ascites. Please note that today's exam was performed without IV contrast and accordingly does not have high sensitivity for solid organ laceration. 7. Sigmoid diverticulosis. 8. Centrilobular emphysema.   Electronically Signed   By: Herbie Baltimore M.D.   On: 07/12/2013 12:33   Ct Cervical Spine Wo Contrast  07/12/2013   CLINICAL DATA:  Fall  EXAM: CT HEAD WITHOUT CONTRAST  CT CERVICAL SPINE WITHOUT CONTRAST  TECHNIQUE: Multidetector CT imaging of the head and cervical spine was performed following the standard protocol without intravenous contrast. Multiplanar CT image reconstructions of the cervical spine were also generated.  COMPARISON:  None.  FINDINGS: CT HEAD FINDINGS  No skull fracture is noted. Paranasal sinuses and mastoid air cells are unremarkable. There is skull swelling and subcutaneous stranding in left occipital region. Subcutaneous hematoma in this region measures about 2.3 cm.  No intracranial hemorrhage, mass effect or midline shift. Atherosclerotic calcifications of carotid siphon are noted. Mild cerebral atrophy is noted. Tiny lacunar infarct is noted in right basal ganglia. No acute cortical infarction. No mass lesion is noted on this unenhanced scan.  CT CERVICAL SPINE FINDINGS  Axial images of the cervical spine shows no acute fracture or subluxation. Computer processed images shows no acute fracture or subluxation. Degenerative changes are noted C1-C2 articulation. Mild disc space flattening with anterior spurring at C5-C6 level. No prevertebral soft tissue swelling. Cervical airway is  patent. There is no pneumothorax in visualized left lung apex.  IMPRESSION: 1. No acute intracranial abnormality. There is scalp swelling and subcutaneous stranding in left occipital region. Subcutaneous hematoma in left occipital region measures 2.4 cm. Mild cerebral atrophy. 2. No cervical spine acute fracture or subluxation. Mild degenerative changes.   Electronically Signed   By: Natasha Mead M.D.   On: 07/12/2013 11:22   Dg Pelvis Portable  07/12/2013   CLINICAL DATA:  Fall, weakness and pain.  EXAM: PORTABLE PELVIS 1-2 VIEWS  COMPARISON:  CT abdomen and pelvis 03/21/2009.  FINDINGS: There is no evidence of pelvic fracture or diastasis. No other pelvic bone lesions are seen.  IMPRESSION: Negative exam.   Electronically Signed   By: Drusilla Kanner M.D.   On: 07/12/2013 11:09   Dg Chest Portable 1 View  07/12/2013   CLINICAL DATA:  Status post fall now with weakness history of dialysis dependent renal failure, and diabetes.  EXAM: PORTABLE CHEST - 1 VIEW  COMPARISON:  Dec 28, 2012.  FINDINGS: There is new blunting of the right lateral costophrenic angle. There is no pneumothorax. The observed portions of the overlying ribs appear normal. The left  lung is well expanded and clear. The cardiac silhouette is mildly enlarged though stable. The pulmonary vascularity is somewhat indistinct on the right.  IMPRESSION: There is a new right pleural effusion. This may be secondary to the patient's underlying cardiac and renal disease or reflect post traumatic processes. As best as can be determined the right ribs are intact. Followup chest CT scanning may be useful if the patient has experienced significant thoracic trauma.   Electronically Signed   By: David  [place]   On: 07/12/2013 11:07   Dg Humerus Left  07/12/2013   CLINICAL DATA:  Status post fall  EXAM: LEFT HUMERUS - 2+ VIEW  COMPARISON:  None.  FINDINGS: There is no evidence of fracture or other focal bone lesions. Soft tissues are unremarkable.   IMPRESSION: Negative.   Electronically Signed   By: Salome Holmes M.D.   On: 07/12/2013 11:54   Dg Hand Complete Left  07/12/2013   CLINICAL DATA:  Status post fall  EXAM: LEFT HAND - COMPLETE 3+ VIEW  COMPARISON:  None.  FINDINGS: The bones are osteopenic. Mild osteoarthritic changes identified within the proximal and distal interphalangeal joints. There is no evidence of acute fracture nor dislocation.  IMPRESSION: 1. Mild osteoarthritic changes. 2. No evidence of acute osseous abnormalities.   Electronically Signed   By: Salome Holmes M.D.   On: 07/12/2013 11:48   IMPRESSIONS:  1. Atrial fibrillation with RVR of unknown duration of onset. The duration of the onset of atrial fibrillation is unclear but she has had difficulty with dialysis and also has extensive evidence of diastolic dysfunction.  2. Chronic diastolic heart failure likely worsened with atrial fibrillation  3. Hypertensive heart disease  4. End-stage renal disease  5. Diabetes mellitus with complications of retinopathy, nephropathy, neuropathy  6. mild to moderate aortic stenosis  7. Thrombocytopenia on intravenous heparin  8. Weight loss  9. Significant peripheral edema -may have elements of nephrotic syndrome with markedly low albumin  10. Malnutrition  11. Coronary atherosclerosis and aortic atherosclerosis as manifested by coronary calcification on CT scan in. Recent catheterization 5 years ago did not show obstructive disease by history  12. Tobacco abuse with need to stop smoking  13. Significant lumbar disc disease and low back pain  RECOMMENDATION:  1. She'll need to be anticoagulated long term. Her CHADS2VASC score is 4 at this time. Continue warfarin in the hospital without coverage with heparin  2. Repeat echocardiogram. Her previous echo showed preserved systolic function but evidence of diastolic dysfunction  3. Her HR is still poorly controlled despite metoprolol.  Her BP was low yesterday after HD but now is  in the 130/60's.  Will try IV Cardizem first and if BP drops may need to consider stopping beta blocker and addig IV Amio for rate control.  Would not use dig in setting of renal failure. 4. Because she lives in [place], we'll need to make arrangements to have her pro time followed in that area.  5. Obtain records of previous cardiology evaluation including catheterization 5 years ago.  8. Smoking cessation      Anita Reichert, MD  07/14/2013  8:05 AM

## 2013-07-14 NOTE — Progress Notes (Signed)
  Echocardiogram 2D Echocardiogram has been performed.  Cristal Howatt, [facility] 07/14/2013, 10:11 AM

## 2013-07-14 NOTE — Progress Notes (Signed)
Subjective: Pt had episode of hypotension with some RVR into the 130s that seemed to correlate with HD completion. Pt was asymptomatic during this episode. Pt wanting to go home and has no other complaints.    Objective: Vital signs in last 24 hours: Filed Vitals:   07/14/13 1035 07/14/13 1045 07/14/13 1100 07/14/13 1115  BP: 116/63 128/63 123/67 116/67  Pulse: 114 62 115 73  Temp:      TempSrc:      Resp:  22 18 16   Height:      Weight:      SpO2:  96% 96% 96%   Weight change: 5 lb 14.6 oz (2.68 kg)  Intake/Output Summary (Last 24 hours) at 07/14/13 1152 Last data filed at 07/14/13 0800  Gross per 24 hour  Intake    360 ml  Output   1678 ml  Net  -1318 ml    Constitutional: She appears well-developed and well-nourished. No distress. obese  HENT:  Mouth/Throat: Oropharynx is clear and moist. No oropharyngeal exudate.  Left occipital hematoma with tenderness to touch  Eyes: Conjunctivae and EOM are normal. Pupils are equal, round, and reactive to light.  Neck: Normal range of motion. Neck supple.  Cardiovascular: Normal rate. Murmur heard. Irregular rhythm  Pulmonary/Chest: Effort normal. No respiratory distress. She has no wheezes.  Poor air movement, decreased breath sound at bases R>L  Abdominal: Soft. Bowel sounds are normal.  Musculoskeletal: She exhibits 1+ edema Skin: She is not diaphoretic.  Abrasion on left hand and right wrist.  Diffuse ecchymosis all over UE Neuro:  A & O x3, 4/5 UE & LE strength Psychiatric: She has a normal mood and affect. Her behavior is normal.   Lab Results: Basic Metabolic Panel:  Recent Labs Lab 07/12/13 1008  NA 134*  K 3.3*  CL 89*  CO2 24  GLUCOSE 100*  BUN 37*  CREATININE 4.73*  CALCIUM 7.8*   CBC:  Recent Labs Lab 07/12/13 1008 07/13/13 0730 07/13/13 1315  WBC 7.3 6.8 5.7  NEUTROABS 5.1  --   --   HGB 12.3 10.9* 10.0*  HCT 35.4* 31.3* 28.3*  MCV 84.5 84.4 84.5  PLT 210 77* 160   Micro Results: No results  found for this or any previous visit (from the past 240 hour(s)). Studies/Results:  Medications: I have reviewed the patient's current medications. Scheduled Meds: . albumin human  25 g Intravenous Once  . calcitRIOL  0.25 mcg Oral Q M,W,F  . calcium acetate  667 mg Oral TID WC  . feeding supplement (NEPRO CARB STEADY)  237 mL Oral BID BM  . furosemide  80 mg Oral BID  . metoprolol tartrate  25 mg Oral BID  . multivitamin  1 tablet Oral QHS  . pantoprazole  40 mg Oral Daily  . polyethylene glycol  17 g Oral Daily  . sodium chloride  3 mL Intravenous Q12H  . warfarin   Does not apply Once  . Warfarin - Pharmacist Dosing Inpatient   Does not apply q1800  . zolpidem  5 mg Oral QHS   Continuous Infusions: . diltiazem (CARDIZEM) infusion 5 mg/hr (07/14/13 1022)   PRN Meds:.albuterol, HYDROcodone-acetaminophen Assessment/Plan: Principal Problem:   Dyspnea Active Problems:   Accidental fall on or from sidewalk curb   Weakness   Atrial fibrillation with RVR   Diabetes mellitus with end stage renal disease   ESRD on hemodialysis   Chronic diastolic heart failure   Long-term (current) use of anticoagulants  Protein-calorie malnutrition, severe  Assessment: 62 year old female with past medical history of ESRD on HD (TTS) since 03/2013, HTN, HL, T2DM, COPD, diastolic CHF, OSA, and GERD who presented on 11/25 with recent fall and found to be in atrial fibrillation with RVR and hypoxic with right loculated pleural effusion.   Atrial fibrillation with RVR: Pt with previously unknown afib and continues to be on afib poor control on metoprolol. Repeat echo pending. Etiology most likely 2/2 loss of atrial kick and fluid status. TSH WNL. CHADSVC score of 4.  -Per cardiology start cardizem gtt -Appreciate cardiology consult --> Following anticoagulation for 3-4 weeks consider initiation of amiodarone followed by cardioversion if she fails to convert back to sinus rhythm -coumadin  -Obtain 2-D  Echo  End Stage Renal Disease on HD - Pt on HD since 03/2013. Pt is oliguric (occasional leaks). She follows with ESRD on dialysis at [facility] on TTS schedule. -Continue Phoslo, calcitrol  -cont RENA-Vit -HD today on TTS schedule   Mechanical Fall with head injury and resulting hematoma due to weakness -  CT head negative for acute intracranial abnormality but evidence of subcutaneous hematoma (2.3 cm) in left occipital region. CT cervical spine without acute fracture or subluxation. XR left hand, humerus, and wrist without evidence of fracture or other focal bone lesions.  -Continue home norco Q6 PRN pain   Hypertension - Pt on home atenolol, clonidine, hydralazine and lisinopril. Pt with hypotension after HD.  -Hold atenolol 50 mg BID  -Hold lisinopril 20 mg BID  -Hold hydralazine 25 mg BID  -Hold clonidine 0.2 mg BID   Grade II Diastolic Heart Failure - Pt with last 2D-Echo on 12/03/12 with evidence of Grade 2 diastolic dysfunction with normal EF (65-70%) and no wall motion abnormalities. Also with mild to moderate aortic stenosis (possibly bicuspid). Pt reports she is oliguric on HD  and compliant with diuretic therapy however still with volume overload.  -Continue home PO 80 mg lasix   Complicated Diabetes Mellitus - Stable. No record of HbA1c on file. Complicated with peripheral neuropathy and nephropathy with progression to ESRD.  -Monitor glucose   Code: Full  Diet: Renal  DVT Ppx: Heparin  Dispo: Disposition is deferred at this time, awaiting improvement of current medical problems.  Anticipated discharge in approximately 2-3 day(s).   The patient does have a current PCP Milana Obey, MD) and does need an [facility] hospital follow-up appointment after discharge.  The patient does not have transportation limitations that hinder transportation to clinic appointments.  .Services Needed at time of discharge: Y = Yes, Blank = No PT:   OT:   RN:   Equipment:    Other:     LOS: 2 days   Christen Bame, MD 07/14/2013, 11:52 AM

## 2013-07-15 LAB — CBC
HCT: 29.1 % — ABNORMAL LOW (ref 36.0–46.0)
MCH: 28.9 pg (ref 26.0–34.0)
MCHC: 34 g/dL (ref 30.0–36.0)
MCV: 85.1 fL (ref 78.0–100.0)
Platelets: 175 10*3/uL (ref 150–400)
RBC: 3.42 MIL/uL — ABNORMAL LOW (ref 3.87–5.11)

## 2013-07-15 LAB — BASIC METABOLIC PANEL
Chloride: 96 mEq/L (ref 96–112)
GFR calc Af Amer: 18 mL/min — ABNORMAL LOW (ref >90)
Potassium: 3.9 mEq/L (ref 3.5–5.1)
Sodium: 134 mEq/L — ABNORMAL LOW (ref 135–145)

## 2013-07-15 LAB — MAGNESIUM: Magnesium: 1.8 mg/dL (ref 1.5–2.5)

## 2013-07-15 LAB — RETICULOCYTES
Retic Count, Absolute: 44.5 10*3/uL (ref 19.0–186.0)
Retic Ct Pct: 1.3 % (ref 0.4–3.1)

## 2013-07-15 LAB — PROTIME-INR
INR: 1.32 (ref 0.00–1.49)
Prothrombin Time: 16.1 seconds — ABNORMAL HIGH (ref 11.6–15.2)

## 2013-07-15 MED ORDER — WARFARIN SODIUM 5 MG PO TABS
5.0000 mg | ORAL_TABLET | Freq: Once | ORAL | Status: AC
  Administered 2013-07-15: 5 mg via ORAL
  Filled 2013-07-15: qty 1

## 2013-07-15 MED ORDER — LIDOCAINE HCL (PF) 1 % IJ SOLN: 5.0000 mL | INTRAMUSCULAR | Status: DC | PRN

## 2013-07-15 MED ORDER — HEPARIN SODIUM (PORCINE) 1000 UNIT/ML DIALYSIS
20.0000 [IU]/kg | INTRAMUSCULAR | Status: DC | PRN
  Filled 2013-07-15: qty 2

## 2013-07-15 MED ORDER — PENTAFLUOROPROP-TETRAFLUOROETH EX AERO
1.0000 | INHALATION_SPRAY | CUTANEOUS | Status: DC | PRN
Start: 2013-07-15 — End: 2013-07-17

## 2013-07-15 MED ORDER — HYDROCODONE-ACETAMINOPHEN 5-325 MG PO TABS
1.0000 | ORAL_TABLET | Freq: Once | ORAL | Status: AC
  Administered 2013-07-15: 1 via ORAL
  Filled 2013-07-15: qty 1

## 2013-07-15 MED ORDER — HEPARIN SODIUM (PORCINE) 1000 UNIT/ML DIALYSIS
1000.0000 [IU] | INTRAMUSCULAR | Status: DC | PRN
  Filled 2013-07-15: qty 1

## 2013-07-15 MED ORDER — SODIUM CHLORIDE 0.9 % IV SOLN: 100.0000 mL | INTRAVENOUS | Status: DC | PRN

## 2013-07-15 MED ORDER — NEPRO/CARBSTEADY PO LIQD: 237.0000 mL | ORAL | Status: DC | PRN

## 2013-07-15 MED ORDER — LIDOCAINE-PRILOCAINE 2.5-2.5 % EX CREA
1.0000 "application " | TOPICAL_CREAM | CUTANEOUS | Status: DC | PRN
  Filled 2013-07-15: qty 5

## 2013-07-15 MED ORDER — ALTEPLASE 2 MG IJ SOLR
2.0000 mg | Freq: Once | INTRAMUSCULAR | Status: AC | PRN
  Filled 2013-07-15: qty 2

## 2013-07-15 MED ORDER — DILTIAZEM LOAD VIA INFUSION
10.0000 mg | Freq: Once | INTRAVENOUS | Status: AC
  Administered 2013-07-15: 10 mg via INTRAVENOUS
  Filled 2013-07-15: qty 10

## 2013-07-15 NOTE — Progress Notes (Signed)
Subjective: Pt had no acute events overnight. Still frustrated has to be in hospital. Pt achieving better rate control on cardizem gtt. No other complaints of SOB, CP, n/v/d.     Objective: Vital signs in last 24 hours: Filed Vitals:   07/15/13 0756 07/15/13 0800 07/15/13 0802 07/15/13 0830  BP: 130/62  118/59 118/68  Pulse:  119 101 105  Temp: 97 F (36.1 C)     TempSrc:      Resp:  12 11 9   Height:      Weight: 206 lb 2.1 oz (93.5 kg)     SpO2:  95% 96%    Weight change:   Intake/Output Summary (Last 24 hours) at 07/15/13 0846 Last data filed at 07/15/13 0800  Gross per 24 hour  Intake     70 ml  Output      0 ml  Net     70 ml    Constitutional: She appears well-developed and well-nourished. No distress. obese  HENT:  Mouth/Throat: Oropharynx is clear and moist. No oropharyngeal exudate.  Left occipital hematoma with tenderness to touch  Eyes: Conjunctivae and EOM are normal. Pupils are equal, round, and reactive to light.  Neck: Normal range of motion. Neck supple.  Cardiovascular: Normal rate. Murmur heard. Irregular rhythm  Pulmonary/Chest: Effort normal. No respiratory distress. She has no wheezes.  Poor air movement, decreased breath sound at bases R>L  Abdominal: Soft. Bowel sounds are normal.  Musculoskeletal: She exhibits 1+ edema Skin: She is not diaphoretic.  Abrasion on left hand and right wrist.  Diffuse ecchymosis all over UE Neuro:  A & O x3, 4/5 UE & LE strength Psychiatric: She has a normal mood and affect. Her behavior is normal.   Lab Results: Basic Metabolic Panel:  Recent Labs Lab 07/12/13 1008  NA 134*  K 3.3*  CL 89*  CO2 24  GLUCOSE 100*  BUN 37*  CREATININE 4.73*  CALCIUM 7.8*   CBC:  Recent Labs Lab 07/12/13 1008  07/13/13 1315 07/15/13 0800  WBC 7.3  < > 5.7 4.9  NEUTROABS 5.1  --   --   --   HGB 12.3  < > 10.0* 9.9*  HCT 35.4*  < > 28.3* 29.1*  MCV 84.5  < > 84.5 85.1  PLT 210  < > 160 175  < > = values in this  interval not displayed. Micro Results: No results found for this or any previous visit (from the past 240 hour(s)). Studies/Results:  Medications: I have reviewed the patient's current medications. Scheduled Meds: . albumin human  25 g Intravenous Once  . calcitRIOL  0.25 mcg Oral Q M,W,F  . calcium acetate  667 mg Oral TID WC  . feeding supplement (NEPRO CARB STEADY)  237 mL Oral BID BM  . furosemide  80 mg Oral BID  . metoprolol tartrate  25 mg Oral BID  . multivitamin  1 tablet Oral QHS  . pantoprazole  40 mg Oral Daily  . polyethylene glycol  17 g Oral Daily  . sodium chloride  3 mL Intravenous Q12H  . warfarin   Does not apply Once  . Warfarin - Pharmacist Dosing Inpatient   Does not apply q1800  . zolpidem  10 mg Oral QHS   Continuous Infusions: . 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   Atrial fibrillation with RVR: Pt with previously unknown afib and continues to be on afib poor control on metoprolol. Repeat echo pending. Etiology most likely 2/2 loss of atrial kick and fluid status. TSH WNL. CHADSVC score of 4. Echo EF 75% but 2/2 significant concentric hypertropy and beat to beat variation, dilated atria bilaterally.  -cardiology following and greatly appreciate recs -will continue cardizem gtt  and transition to orals after HD -Appreciate cardiology consult --> Following anticoagulation for 3-4 weeks consider initiation of amiodarone followed by cardioversion if she fails to convert back to sinus rhythm -coumadin   End Stage Renal Disease on HD - Pt on HD since 03/2013. Pt is oliguric (occasional leaks). She follows with ESRD on dialysis at the [facility] on TTS schedule.  -Continue Phoslo, calcitrol, and RENA-Vit -anemia panel pending -HD today   Mechanical Fall with head injury and resulting hematoma due to weakness -  CT head negative for acute intracranial abnormality but evidence of subcutaneous hematoma (2.3 cm) in left occipital region. CT cervical spine without acute fracture or subluxation. XR left hand, humerus, and wrist without evidence of fracture or other focal bone lesions.  -Continue home norco Q6 PRN pain   Hypertension - Pt on home atenolol, clonidine, hydralazine and lisinopril. Pt with hypotension after HD.  -Hold atenolol 50 mg BID  -Hold lisinopril 20 mg BID  -Hold hydralazine 25 mg BID  -Hold clonidine 0.2 mg BID   Grade II Diastolic Heart Failure - Pt with last 2D-Echo on 12/03/12 with evidence of Grade 2 diastolic dysfunction with normal EF (65-70%) and no wall motion abnormalities. Also with mild to moderate aortic stenosis (possibly bicuspid). Pt reports she is oliguric on HD  and compliant with diuretic therapy however still with volume overload.  -Continue home PO 80 mg lasix   Complicated Diabetes Mellitus - Stable. No record of HbA1c on file. Complicated with peripheral neuropathy and nephropathy with progression to ESRD.  -Monitor glucose   Code: Full  Diet: Renal  DVT Ppx: Heparin  Dispo: Disposition is deferred at this time, awaiting improvement of current medical problems.  Anticipated discharge in approximately 2-3 day(s).   The patient does have a current PCP Milana Obey, MD) and does need an [facility] hospital follow-up  appointment after discharge.  The patient does not have transportation limitations that hinder transportation to clinic appointments.  .Services Needed at time of discharge: Y = Yes, Blank = No PT:   OT:   RN:   Equipment:   Other:     LOS: 3 days   Christen Bame, MD 07/15/2013, 8:46 AM

## 2013-07-15 NOTE — Procedures (Signed)
Tolerating hemodialysis treatments.  HR 110. Target goal 3500cc.  Receiving cardizem drip.  Assessment:  1 ESRD TTS w/ IDLHypotension  2 Volume overload  3 s/p Fall  4 Newly diagnosed Afib  5 Generalized weakness/FTT  6 Acute thrombocytopenia was lab error  Plan:  1 Hemodialysis in progress. Getting Cardizem drip now  Subjective: Interval History: Feels better  Objective: Vital signs in last 24 hours: Temp:  [97 F (36.1 C)-97.9 F (36.6 C)] 97 F (36.1 C) (11/28 0756) Pulse Rate:  [62-136] 106 (11/28 0859) Resp:  [9-37] 11 (11/28 0859) BP: (101-131)/(54-91) 123/69 mmHg (11/28 0859) SpO2:  [91 %-99 %] 97 % (11/28 0859) Weight:  [93.5 kg (206 lb 2.1 oz)] 93.5 kg (206 lb 2.1 oz) (11/28 0756) Weight change:   Intake/Output from previous day: 11/27 0701 - 11/28 0700 In: 305 [P.O.:240; I.V.:65] Out: -  Intake/Output this shift: Total I/O In: 5 [I.V.:5] Out: -   General appearance: alert and cooperative Cardio: irregularly irregular rhythm GI: soft, non-tender; bowel sounds normal; no masses,  no organomegaly Extremities: edema 2+  Lab Results:  Recent Labs  07/13/13 1315 07/15/13 0800  WBC 5.7 4.9  HGB 10.0* 9.9*  HCT 28.3* 29.1*  PLT 160 175   BMET:  Recent Labs  07/12/13 1008 07/15/13 0800  NA 134* 134*  K 3.3* 3.9  CL 89* 96  CO2 24 26  GLUCOSE 100* 94  BUN 37* 15  CREATININE 4.73* 3.00*  CALCIUM 7.8* 7.9*   No results found for this basename: PTH,  in the last 72 hours Iron Studies: No results found for this basename: IRON, TIBC, TRANSFERRIN, FERRITIN,  in the last 72 hours Studies/Results: Dg Chest 2 View  07/14/2013   CLINICAL DATA:  Recent fall with head injury and altered level of consciousness. Right pleural effusion.  EXAM: CHEST  2 VIEW  COMPARISON:  CT and radiographs 07/12/2013.  FINDINGS: The loculated right pleural effusion does not appear significantly changed. Adjacent right basilar airspace disease is stable. The left lung is clear.  An azygos fissure is noted. The heart size and mediastinal contours are stable with aortic atherosclerosis  IMPRESSION: No significant change in loculated right pleural effusion and adjacent right basilar airspace disease.   Electronically Signed   By: Roxy Horseman M.D.   On: 07/14/2013 08:30   Scheduled: . albumin human  25 g Intravenous Once  . calcitRIOL  0.25 mcg Oral Q M,W,F  . calcium acetate  667 mg Oral TID WC  . feeding supplement (NEPRO CARB STEADY)  237 mL Oral BID BM  . furosemide  80 mg Oral BID  . metoprolol tartrate  25 mg Oral BID  . multivitamin  1 tablet Oral QHS  . pantoprazole  40 mg Oral Daily  . polyethylene glycol  17 g Oral Daily  . sodium chloride  3 mL Intravenous Q12H  . warfarin  5 mg Oral ONCE-1800  . warfarin   Does not apply Once  . Warfarin - Pharmacist Dosing Inpatient   Does not apply q1800  . zolpidem  10 mg Oral QHS     LOS: 3 days   Ryenne Lynam C 07/15/2013,9:13 AM

## 2013-07-15 NOTE — Evaluation (Signed)
Physical Therapy Evaluation Patient Details Name: Anita [name] MRN: [id number] DOB: 1950/10/05 Today's Date: 07/15/2013 Time: 1478-2956 PT Time Calculation (min): 21 min  PT Assessment / Plan / Recommendation History of Present Illness  Anita [name] is a 62 year old female with past medical history of ESRD on HD  (TTS) since 03/2013, HTN, HL, T2DM, COPD, diastolic CHF, OSA, and GERD who presents with weakness and recent fall.  Clinical Impression  Patient presents with decreased independence and safety with mobility and will benefit from skilled PT in the acute setting to allow return home with spouse assist and HHPT.  Seems reluctant to use device at home (states small doorways,) but feel would be less of a fall risk with use of RW.  Will follow.    PT Assessment  Patient needs continued PT services    Follow Up Recommendations  Home health PT (on non-dialysis days)    Does the patient have the potential to tolerate intense rehabilitation    N/A  Barriers to Discharge  None      Equipment Recommendations  Rolling walker with 5" wheels    Recommendations for Other Services   None  Frequency Min 3X/week    Precautions / Restrictions Precautions Precautions: Fall   Pertinent Vitals/Pain C/o tailbone pain with sitting in chair      Mobility  Bed Mobility Bed Mobility: Not assessed Details for Bed Mobility Assistance: up in chair on entry and sitting edge of bed on exit Transfers Transfers: Sit to Stand;Stand to Sit Sit to Stand: 2: Max assist;3: Mod assist;From chair/3-in-1;With upper extremity assist;With armrests Stand to Sit: To bed;4: Min assist Details for Transfer Assistance: assist for anterior weight shift and lifting up from chair (at home uses lift chair or high "executive desk chair" Ambulation/Gait Ambulation/Gait Assistance: 4: Min guard;4: Min assist Ambulation Distance (Feet): 90 Feet Assistive device: Other (Comment) (intermittent wall rail  use) Ambulation/Gait Assistance Details: short shuffling steps decreased clearance (states trying to pick up her feet) Gait Pattern: Step-to pattern;Decreased stride length;Shuffle        PT Diagnosis: Abnormality of gait;Generalized weakness  PT Problem List: Decreased strength;Decreased mobility;Decreased balance;Decreased activity tolerance;Cardiopulmonary status limiting activity;Decreased safety awareness PT Treatment Interventions: DME instruction;Gait training;Therapeutic exercise;Therapeutic activities;Functional mobility training;Neuromuscular re-education;Balance training;Patient/family education     PT Goals(Current goals can be found in the care plan section) Acute Rehab PT Goals Patient Stated Goal: To return home with spouse assist PT Goal Formulation: With patient Time For Goal Achievement: 07/29/13 Potential to Achieve Goals: Good  Visit Information  Last PT Received On: 07/15/13 Assistance Needed: +1 History of Present Illness: Anita [name] is a 62 year old female with past medical history of ESRD on HD  (TTS) since 03/2013, HTN, HL, T2DM, COPD, diastolic CHF, OSA, and GERD who presents with weakness and recent fall.       Prior Functioning  Home Living Family/patient expects to be discharged to:: Private residence Living Arrangements: Spouse/significant other Available Help at Discharge: Family;Available PRN/intermittently Type of Home: House Home Access: Stairs to enter [employer] of Steps: 1 Home Layout: One level Home Equipment: Bedside commode Additional Comments: sitdown bathtub, borrowed 3:1 uses at bedside, lift chair, craftmatic bed Prior Function Level of Independence: Needs assistance Gait / Transfers Assistance Needed: spouse assists out of car  ADL's / Homemaking Assistance Needed: spouse assists with bathing Communication Communication: No difficulties    Cognition  Cognition Arousal/Alertness: Awake/alert Behavior During  Therapy: WFL for tasks assessed/performed Overall Cognitive Status: Within Functional Limits  for tasks assessed    Extremity/Trunk Assessment Lower Extremity Assessment Lower Extremity Assessment: RLE deficits/detail;LLE deficits/detail RLE Deficits / Details: pitting edema, AROM grossly WFL except admits to tightness with ankle DF; strength 3+ to 4-/5 LLE Deficits / Details: pitting edema, AROM grossly WFL except admits to tightness with ankle DF; strength 3+ to 4-/5   Balance Balance Balance Assessed: Yes Static Standing Balance Static Standing - Balance Support: No upper extremity supported Static Standing - Level of Assistance: 5: Stand by assistance Static Standing - Comment/# of Minutes: in room prior to sitting stood with supervision  End of Session PT - End of Session Equipment Utilized During Treatment: Gait belt Activity Tolerance: Patient limited by fatigue Patient left: in bed;with call bell/phone within reach Nurse Communication: Mobility status  GP     [facility] 07/15/2013, 2:54 PM  Sheran Lawless, PT [phone] 07/15/2013

## 2013-07-15 NOTE — Progress Notes (Signed)
ANTICOAGULATION CONSULT NOTE - Follow Up Consult  Pharmacy Consult for coumadin Indication: atrial fibrillation  Allergies  Allergen Reactions  . Doxycycline Nausea And Vomiting  . Lipitor [Atorvastatin] Nausea And Vomiting    Patient Measurements: Height: 5' 6.5" (168.9 cm) Weight: 206 lb 2.1 oz (93.5 kg) IBW/kg (Calculated) : 60.45  Vital Signs: Temp: 97 F (36.1 C) (11/28 0756) Temp src: Oral (11/28 0700) BP: 118/68 mmHg (11/28 0830) Pulse Rate: 105 (11/28 0830)  Labs:  Recent Labs  07/12/13 1008 07/13/13 0730 07/13/13 1030 07/13/13 1200 07/13/13 1315 07/15/13 0500 07/15/13 0800  HGB 12.3 10.9*  --   --  10.0*  --  9.9*  HCT 35.4* 31.3*  --   --  28.3*  --  29.1*  PLT 210 77*  --   --  160  --  175  LABPROT 11.8  --   --   --   --  16.1*  --   INR 0.88  --   --   --   --  1.32  --   HEPARINUNFRC  --   --   --  0.21*  --   --   --   CREATININE 4.73*  --   --   --   --   --   --   CKTOTAL  --   --  48  --   --   --   --   TROPONINI <0.30  --   --   --   --   --   --     Estimated Creatinine Clearance: 14.2 ml/min (by C-G formula based on Cr of 4.73).   Medications:  Scheduled:  . albumin human  25 g Intravenous Once  . calcitRIOL  0.25 mcg Oral Q M,W,F  . calcium acetate  667 mg Oral TID WC  . feeding supplement (NEPRO CARB STEADY)  237 mL Oral BID BM  . furosemide  80 mg Oral BID  . metoprolol tartrate  25 mg Oral BID  . multivitamin  1 tablet Oral QHS  . pantoprazole  40 mg Oral Daily  . polyethylene glycol  17 g Oral Daily  . sodium chloride  3 mL Intravenous Q12H  . warfarin   Does not apply Once  . Warfarin - Pharmacist Dosing Inpatient   Does not apply q1800  . zolpidem  10 mg Oral QHS    Assessment: 62 yo female with afib started on coumadin. Baseline INR= 0.88. Platelet count now up to 175k.  CHADS-VASC score= 4.  Coumadin 5mg  x2 days now. INR is trending up.  Goal of Therapy:  INR 2-3 Monitor platelets by anticoagulation protocol: Yes    Plan:  -Coumadin 5mg  po today -Daily PT/INR

## 2013-07-15 NOTE — Progress Notes (Signed)
Subjective: Patient lying in bed receiving dialysis on exam. Continues to express wishes to go home. Still with weakness with sitting on side of bed. Wishes to get out of bed. Denies complaints of SOB, dyspnea, CP, fever.  Objective: Vital signs in last 24 hours: Filed Vitals:   07/15/13 0756 07/15/13 0800 07/15/13 0802 07/15/13 0830  BP: 130/62  118/59 118/68  Pulse:  119 101 105  Temp: 97 F (36.1 C)     TempSrc:      Resp:  12 11 9   Height:      Weight: 93.5 kg (206 lb 2.1 oz)     SpO2:  95% 96%    Weight change:   Intake/Output Summary (Last 24 hours) at 07/15/13 0858 Last data filed at 07/15/13 0800  Gross per 24 hour  Intake     70 ml  Output      0 ml  Net     70 ml   BP 123/69  Pulse 106  Temp(Src) 97 F (36.1 C) (Oral)  Resp 11  Ht 5' 6.5" (1.689 m)  Wt 93.5 kg (206 lb 2.1 oz)  BMI 32.78 kg/m2  SpO2 97% General appearance: alert, cooperative and no distress Head: Left occipital hematoma Neck: no adenopathy, no carotid bruit, no JVD and supple, symmetrical, trachea midline Lungs: poor inspiratory effort, coarse breath sounds Heart: irregularly irregular rhythm Abdomen: soft, non-tender; bowel sounds normal; no masses,  no organomegaly Extremities: edema 3+ pitting edema to knee Skin: diffuse senile purpura Lab Results: Basic Metabolic Panel:  Recent Labs Lab 07/12/13 1008  NA 134*  K 3.3*  CL 89*  CO2 24  GLUCOSE 100*  BUN 37*  CREATININE 4.73*  CALCIUM 7.8*   Liver Function Tests:  Recent Labs Lab 07/12/13 1008  AST 17  ALT 23  ALKPHOS 67  BILITOT 0.3  PROT 5.4*  ALBUMIN 2.2*   No results found for this basename: LIPASE, AMYLASE,  in the last 168 hours No results found for this basename: AMMONIA,  in the last 168 hours CBC:  Recent Labs Lab 07/12/13 1008  07/13/13 1315 07/15/13 0800  WBC 7.3  < > 5.7 4.9  NEUTROABS 5.1  --   --   --   HGB 12.3  < > 10.0* 9.9*  HCT 35.4*  < > 28.3* 29.1*  MCV 84.5  < > 84.5 85.1  PLT 210  <  > 160 175  < > = values in this interval not displayed. Cardiac Enzymes:  Recent Labs Lab 07/12/13 1008 07/13/13 1030  CKTOTAL  --  48  TROPONINI <0.30  --    BNP: No results found for this basename: PROBNP,  in the last 168 hours D-Dimer: No results found for this basename: DDIMER,  in the last 168 hours CBG:  Recent Labs Lab 07/12/13 1712  GLUCAP 83   Hemoglobin A1C: No results found for this basename: HGBA1C,  in the last 168 hours Fasting Lipid Panel: No results found for this basename: CHOL, HDL, LDLCALC, TRIG, CHOLHDL, LDLDIRECT,  in the last 168 hours Thyroid Function Tests:  Recent Labs Lab 07/13/13 1030  TSH 4.126  FREET4 0.88   Coagulation:  Recent Labs Lab 07/12/13 1008 07/15/13 0500  LABPROT 11.8 16.1*  INR 0.88 1.32   Anemia Panel:  Recent Labs Lab 07/15/13 0800  RETICCTPCT 1.3   Urine Drug Screen: Drugs of Abuse  No results found for this basename: labopia, cocainscrnur, labbenz, amphetmu, thcu, labbarb    Alcohol Level:  No results found for this basename: ETH,  in the last 168 hours Urinalysis: No results found for this basename: COLORURINE, APPERANCEUR, LABSPEC, PHURINE, GLUCOSEU, HGBUR, BILIRUBINUR, KETONESUR, PROTEINUR, UROBILINOGEN, NITRITE, LEUKOCYTESUR,  in the last 168 hours  Micro Results: No results found for this or any previous visit (from the past 240 hour(s)). Studies/Results: Dg Chest 2 View  07/14/2013   CLINICAL DATA:  Recent fall with head injury and altered level of consciousness. Right pleural effusion.  EXAM: CHEST  2 VIEW  COMPARISON:  CT and radiographs 07/12/2013.  FINDINGS: The loculated right pleural effusion does not appear significantly changed. Adjacent right basilar airspace disease is stable. The left lung is clear. An azygos fissure is noted. The heart size and mediastinal contours are stable with aortic atherosclerosis  IMPRESSION: No significant change in loculated right pleural effusion and adjacent right  basilar airspace disease.   Electronically Signed   By: Roxy Horseman M.D.   On: 07/14/2013 08:30   Medications:  I have reviewed the patient's current medications. Prior to Admission:  Prescriptions prior to admission  Medication Sig Dispense Refill  . atenolol (TENORMIN) 50 MG tablet Take 50 mg by mouth 2 (two) times daily.       . calcitRIOL (ROCALTROL) 0.25 MCG capsule Take 0.25 mcg by mouth every Monday, Wednesday, and Friday.       . calcium acetate (PHOSLO) 667 MG capsule Take 667 mg by mouth 3 (three) times daily with meals.      Marland Kitchen Cinnamon ([facility] CINNAMON) 500 MG capsule Take 500 mg by mouth daily.      . cloNIDine (CATAPRES) 0.2 MG tablet Take 0.2 mg by mouth 2 (two) times daily.      . Cyanocobalamin (VITAMIN B 12 PO) Take 1,000 mg by mouth daily.      . furosemide (LASIX) 40 MG tablet Take 80 mg by mouth 2 (two) times daily.       . hydrALAZINE (APRESOLINE) 25 MG tablet Take 25 mg by mouth 2 (two) times daily.       Marland Kitchen HYDROcodone-acetaminophen (NORCO) 7.5-325 MG per tablet Take 1 tablet by mouth every 6 (six) hours as needed for pain.  30 tablet  0  . lisinopril (PRINIVIL,ZESTRIL) 20 MG tablet Take 20 mg by mouth 2 (two) times daily.       Marland Kitchen omeprazole (PRILOSEC) 20 MG capsule Take 20 mg by mouth daily as needed (acid reflux).       . polyethylene glycol powder (GLYCOLAX/MIRALAX) powder Take 17 g by mouth daily.  527 g  0  . zolpidem (AMBIEN) 10 MG tablet Take 10 mg by mouth at bedtime.        Scheduled: . albumin human  25 g Intravenous Once  . calcitRIOL  0.25 mcg Oral Q M,W,F  . calcium acetate  667 mg Oral TID WC  . feeding supplement (NEPRO CARB STEADY)  237 mL Oral BID BM  . furosemide  80 mg Oral BID  . metoprolol tartrate  25 mg Oral BID  . multivitamin  1 tablet Oral QHS  . pantoprazole  40 mg Oral Daily  . polyethylene glycol  17 g Oral Daily  . sodium chloride  3 mL Intravenous Q12H  . warfarin  5 mg Oral ONCE-1800  . warfarin   Does not apply Once  . Warfarin -  Pharmacist Dosing Inpatient   Does not apply q1800  . zolpidem  10 mg Oral QHS   Continuous: . diltiazem (CARDIZEM) infusion 5  mg/hr (07/14/13 1855)   Scheduled Meds: . albumin human  25 g Intravenous Once  . calcitRIOL  0.25 mcg Oral Q M,W,F  . calcium acetate  667 mg Oral TID WC  . feeding supplement (NEPRO CARB STEADY)  237 mL Oral BID BM  . furosemide  80 mg Oral BID  . metoprolol tartrate  25 mg Oral BID  . multivitamin  1 tablet Oral QHS  . pantoprazole  40 mg Oral Daily  . polyethylene glycol  17 g Oral Daily  . sodium chloride  3 mL Intravenous Q12H  . warfarin   Does not apply Once  . Warfarin - Pharmacist Dosing Inpatient   Does not apply q1800  . zolpidem  10 mg Oral QHS   Continuous Infusions: . 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 Workup has revealed new onset afib (age indeterminate), bilateral pleural effusions, and weakness.   #Afib (new diagnosis, age indeterminate) with RVR  Newly diagnosed on admission. Patient's previous EKG in May 2014 with no evidence of afib and patient has no known history of afib. Ventricular rate of 129 on admission with 5mg  cardizem given in ED with mild response. CHA2DS2-VASc score = 4 (4% annual stroke risk). New echo reveals LVEF 75%, LA dilation, RA moderate-severe dilation, increased PA psi ( ), dilated IVC, and unable to assess diastolic function due to afib with  rapid rate. - appreciated cardiology assistance in the management of this patient - coumadin for afib (no heparin bridge) - continue lopressor 25 BID - continue cardizem IV and will convert to PO likely tomorrow, blood pressure is tolerating this well, HR still tachy - If fails to convert to NSR, will consider amio followed by cardioversion after 3-4 weeks, per cardiology recs   # Pleural effusion  CT chest reveals likely loculated right pleural effusion and small left effusion. Remains afebrile without leukocytosis. Most likely related to her CHF but differential still includes pna or possible occult malignancy, and less likely pneumoconiosis. Oxygen sats have improved and she no longer requires oxygen. Repeat CXR shows improved Left aeration and stable right loculated effusion. - will continue to monitor  # ESRD  Patient continues to have signs of volume overload given her LE edema and crackles. Her dyspnea has improved and she has been weaned off wall oxygen.  - appreciate nephrology consult in the management of this patient - renal diet and continue RENA-vit - will continue home calcitriol, calcium acetate, and lasix   # HTN  History of long standing hypertension. Low BPs after HD. She received NS bolus' and home anti-hypertensives were held on admission. BP stable. Will continue to monitor.  -Hold atenolol 50 mg BID  -Hold lisinopril 20 mg BID  -Hold hydralazine 25 mg BID  -Hold clonidine 0.2 mg BID   #Adrenal adenoma CT abd/pelvis revealed a stable density left adrenal mass consistent with adenoma. - will arrange for recommendation of outpatient r/o pheochromocytoma and  Cushing's.  #Thyroid CT chest revealed a 2 cm calcified R thyroid nodule. Patient has a history of laryngeal neck mass excision. Currently euthyroid. TSH and Free T4, WNL - will arrange for recommendation of outpatient FNA at discharge  # Weakness and weight loss  Patient describes weakness since beginning  dialysis, approximately 2-3 months. She notes more weakness in her lower extremity than upper and has difficulty rising from bed in the mornings and reaching with her arms. Records in epic indicate a weight loss 251>>195 over past 5 months. Differential includes muscle atrophy 2/2 increased sedentary lifestyle vs. polymyositis vs. thyroid dysfunction  - appreciate RD consult - continue Nepro BID and RENA-vit - PT/OT to eval today  #Dysphagia  Patient reported difficulty swallowing on admission, especially pills. She denies any vomiting secondary to swallowing and no progression in the nature of difficulty. SLP eval was obtained and revealed mild aspiration risk with no follow-up recommendations noted. Will continue to monitor.   #DM  Patient states she no longer is on medication for her diabetes. ESRD 2/2 DM. All documented random glucose levels <200 per epic. Will continue to monitor CBG. No utility in obtaining A1C on ESRD patients as normal levels not predictive of disease activity.  - monitor glucose  #COPD  Patient has 40 pack year smoking history and is current smoker.  - will provide smoking cessation counseling   #CHF  Grade II diastolic dysfunction with preserved EF 65-70%, via April 2014 echo. Current echo read with LVEF 75%, unable to assess diastolic function. She has s/s of volume overload seen with B/L pleural effusions and increased LE edema.  - continue lasix  - will continue to follow along with cardiology  #GERD  Well controlled history of reflux with no complaints on admission  - continue PPI   #Constipation  Patient notes she developed significant constipation s/p initiation of dialysis. She takes miralax at home daily with good response. Will resume home med and monitor.  - continue miralax  # Acute Thrombocytopenia  Patient was started on heparin for afib 11/25 on admission with drop in PLTC 210>>77. Patient is not heparin naive as she has been a dialysis patient  since August. HIT suspected with a 4T score = 4 (2 pts for >50% fall in PLTC count with a nadir <100,000 and 2 pts for onset of fall <=1day if heparin exposure within past 30 days). Other possibility remains lab error. Recheck of CBC revealed a PLTC of 160 and remains WNL. - resolved - will still f/u HIT panel   This is a Psychologist, occupational Note.  The care of the patient was discussed with Dr. Otis Brace and the assessment and plan formulated with their assistance.  Please see their attached note for official documentation of the daily encounter.   LOS: 3 days   Lewie Chamber, Med Student 07/15/2013, 8:58 AM  Resident Addendum to Medical Student Note   I have seen and examined the patient, and agree with the the medical student assessment and plan outlined above. Please see my brief note for additional details.  Christen Bame  07/15/2013, 11:14 AM

## 2013-07-15 NOTE — Progress Notes (Signed)
SUBJECTIVE:  Currently in HD with no complaints  OBJECTIVE:   Vitals:   Filed Vitals:   07/15/13 0800 07/15/13 0802 07/15/13 0830 07/15/13 0859  BP:  118/59 118/68 123/69  Pulse: 119 101 105 106  Temp:      TempSrc:      Resp: 12 11 9 11   Height:      Weight:      SpO2: 95% 96%  97%   I&O's:   Intake/Output Summary (Last 24 hours) at 07/15/13 0911 Last data filed at 07/15/13 0800  Gross per 24 hour  Intake     70 ml  Output      0 ml  Net     70 ml   TELEMETRY: Reviewed telemetry pt in atrial fibrillation with RVR     PHYSICAL EXAM General: Well developed, well nourished, in no acute distress Head: Eyes PERRLA, No xanthomas.   Normal cephalic and atramatic  Lungs:   Clear bilaterally to auscultation and percussion. Heart:   Irregularly irregular and tachy S1 S2 Pulses are 2+ & equal. Abdomen: Bowel sounds are positive, abdomen soft and non-tender without masses  Extremities:   No clubbing, cyanosis or edema.  DP +1 Neuro: Alert and oriented X 3. Psych:  Good affect, responds appropriately   LABS: Basic Metabolic Panel:  Recent Labs  16/10/96 1008 07/15/13 0800  NA 134* 134*  K 3.3* 3.9  CL 89* 96  CO2 24 26  GLUCOSE 100* 94  BUN 37* 15  CREATININE 4.73* 3.00*  CALCIUM 7.8* 7.9*  MG  --  1.8   Liver Function Tests:  Recent Labs  07/12/13 1008  AST 17  ALT 23  ALKPHOS 67  BILITOT 0.3  PROT 5.4*  ALBUMIN 2.2*   No results found for this basename: LIPASE, AMYLASE,  in the last 72 hours CBC:  Recent Labs  07/12/13 1008  07/13/13 1315 07/15/13 0800  WBC 7.3  < > 5.7 4.9  NEUTROABS 5.1  --   --   --   HGB 12.3  < > 10.0* 9.9*  HCT 35.4*  < > 28.3* 29.1*  MCV 84.5  < > 84.5 85.1  PLT 210  < > 160 175  < > = values in this interval not displayed. Cardiac Enzymes:  Recent Labs  07/12/13 1008 07/13/13 1030  CKTOTAL  --  48  TROPONINI <0.30  --    BNP: No components found with this basename: POCBNP,  D-Dimer: No results found for  this basename: DDIMER,  in the last 72 hours Hemoglobin A1C: No results found for this basename: HGBA1C,  in the last 72 hours Fasting Lipid Panel: No results found for this basename: CHOL, HDL, LDLCALC, TRIG, CHOLHDL, LDLDIRECT,  in the last 72 hours Thyroid Function Tests:  Recent Labs  07/13/13 1030  TSH 4.126   Anemia Panel:  Recent Labs  07/15/13 0800  RETICCTPCT 1.3   Coag Panel:   Lab Results  Component Value Date   INR 1.32 07/15/2013   INR 0.88 07/12/2013    RADIOLOGY: Ct Abdomen Pelvis Wo Contrast  07/12/2013   CLINICAL DATA:  Fall with head injury and altered level of consciousness.  EXAM: CT CHEST, ABDOMEN AND PELVIS WITHOUT CONTRAST  TECHNIQUE: Multidetector CT imaging of the chest, abdomen and pelvis was performed following the standard protocol without IV contrast.  COMPARISON:  Multiple exams, including 07/12/2013 and 03/21/2009.  FINDINGS: CT CHEST FINDINGS  2.0 cm partially calcified nodule the right thyroid lobe.  Coronary artery atherosclerosis with calcification of the aortic valve and mild cardiomegaly. Diffuse subcutaneous and mediastinal edema.  Moderate and likely loculated right pleural effusion with small left pleural effusion. Passive atelectasis noted. Suspected centrilobular emphysema.  CT ABDOMEN AND PELVIS FINDINGS  Diffuse subcutaneous edema with low grade mesenteric edema, all increased from prior. Trace fluid in the left paracolic gutter. Stable low-density left adrenal mass compatible with adenoma. Mild renal atrophy. Pancreas unremarkable in non-contrast CT appearance.  Aortoiliac atherosclerotic vascular disease noted with scattered small retroperitoneal lymph nodes. Sigmoid diverticulosis. No dilated bowel. Small amount of free pelvic fluid.  IMPRESSION: 1. Diffuse subcutaneous, mediastinal, and mesenteric edema compatible with 3rd spacing of fluid. 2. Moderate right and small left pleural effusions. The right pleural effusion is probably loculated.  3. Right-sided thyroid nodule. Consider further evaluation with thyroid ultrasound. If patient is clinically hyperthyroid, consider nuclear medicine thyroid uptake and scan. 4. Atherosclerosis. 5. Stable left adrenal adenoma. 6. Small amount of ascites. Please note that today's exam was performed without IV contrast and accordingly does not have high sensitivity for solid organ laceration. 7. Sigmoid diverticulosis. 8. Centrilobular emphysema.   Electronically Signed   By: Herbie Baltimore M.D.   On: 07/12/2013 12:33   Dg Chest 2 View  07/14/2013   CLINICAL DATA:  Recent fall with head injury and altered level of consciousness. Right pleural effusion.  EXAM: CHEST  2 VIEW  COMPARISON:  CT and radiographs 07/12/2013.  FINDINGS: The loculated right pleural effusion does not appear significantly changed. Adjacent right basilar airspace disease is stable. The left lung is clear. An azygos fissure is noted. The heart size and mediastinal contours are stable with aortic atherosclerosis  IMPRESSION: No significant change in loculated right pleural effusion and adjacent right basilar airspace disease.   Electronically Signed   By: Roxy Horseman M.D.   On: 07/14/2013 08:30   Dg Wrist Complete Left  07/12/2013   CLINICAL DATA:  Status post fall  EXAM: LEFT WRIST - COMPLETE 3+ VIEW  FINDINGS: There is no evidence of fracture or dislocation. There is no evidence of arthropathy or other focal bone abnormality. Soft tissues are unremarkable. The bones are osteopenic.  IMPRESSION: Osteopenia without evidence of acute osseous abnormalities.   Electronically Signed   By: Salome Holmes M.D.   On: 07/12/2013 11:53   Ct Head Wo Contrast  07/12/2013   CLINICAL DATA:  Fall  EXAM: CT HEAD WITHOUT CONTRAST  CT CERVICAL SPINE WITHOUT CONTRAST  TECHNIQUE: Multidetector CT imaging of the head and cervical spine was performed following the standard protocol without intravenous contrast. Multiplanar CT image reconstructions of the  cervical spine were also generated.  COMPARISON:  None.  FINDINGS: CT HEAD FINDINGS  No skull fracture is noted. Paranasal sinuses and mastoid air cells are unremarkable. There is skull swelling and subcutaneous stranding in left occipital region. Subcutaneous hematoma in this region measures about 2.3 cm.  No intracranial hemorrhage, mass effect or midline shift. Atherosclerotic calcifications of carotid siphon are noted. Mild cerebral atrophy is noted. Tiny lacunar infarct is noted in right basal ganglia. No acute cortical infarction. No mass lesion is noted on this unenhanced scan.  CT CERVICAL SPINE FINDINGS  Axial images of the cervical spine shows no acute fracture or subluxation. Computer processed images shows no acute fracture or subluxation. Degenerative changes are noted C1-C2 articulation. Mild disc space flattening with anterior spurring at C5-C6 level. No prevertebral soft tissue swelling. Cervical airway is patent. There is no pneumothorax in  visualized left lung apex.  IMPRESSION: 1. No acute intracranial abnormality. There is scalp swelling and subcutaneous stranding in left occipital region. Subcutaneous hematoma in left occipital region measures 2.4 cm. Mild cerebral atrophy. 2. No cervical spine acute fracture or subluxation. Mild degenerative changes.   Electronically Signed   By: Natasha Mead M.D.   On: 07/12/2013 11:22   Ct Chest Wo Contrast  07/12/2013   CLINICAL DATA:  Fall with head injury and altered level of consciousness.  EXAM: CT CHEST, ABDOMEN AND PELVIS WITHOUT CONTRAST  TECHNIQUE: Multidetector CT imaging of the chest, abdomen and pelvis was performed following the standard protocol without IV contrast.  COMPARISON:  Multiple exams, including 07/12/2013 and 03/21/2009.  FINDINGS: CT CHEST FINDINGS  2.0 cm partially calcified nodule the right thyroid lobe.  Coronary artery atherosclerosis with calcification of the aortic valve and mild cardiomegaly. Diffuse subcutaneous and  mediastinal edema.  Moderate and likely loculated right pleural effusion with small left pleural effusion. Passive atelectasis noted. Suspected centrilobular emphysema.  CT ABDOMEN AND PELVIS FINDINGS  Diffuse subcutaneous edema with low grade mesenteric edema, all increased from prior. Trace fluid in the left paracolic gutter. Stable low-density left adrenal mass compatible with adenoma. Mild renal atrophy. Pancreas unremarkable in non-contrast CT appearance.  Aortoiliac atherosclerotic vascular disease noted with scattered small retroperitoneal lymph nodes. Sigmoid diverticulosis. No dilated bowel. Small amount of free pelvic fluid.  IMPRESSION: 1. Diffuse subcutaneous, mediastinal, and mesenteric edema compatible with 3rd spacing of fluid. 2. Moderate right and small left pleural effusions. The right pleural effusion is probably loculated. 3. Right-sided thyroid nodule. Consider further evaluation with thyroid ultrasound. If patient is clinically hyperthyroid, consider nuclear medicine thyroid uptake and scan. 4. Atherosclerosis. 5. Stable left adrenal adenoma. 6. Small amount of ascites. Please note that today's exam was performed without IV contrast and accordingly does not have high sensitivity for solid organ laceration. 7. Sigmoid diverticulosis. 8. Centrilobular emphysema.   Electronically Signed   By: Herbie Baltimore M.D.   On: 07/12/2013 12:33   Ct Cervical Spine Wo Contrast  07/12/2013   CLINICAL DATA:  Fall  EXAM: CT HEAD WITHOUT CONTRAST  CT CERVICAL SPINE WITHOUT CONTRAST  TECHNIQUE: Multidetector CT imaging of the head and cervical spine was performed following the standard protocol without intravenous contrast. Multiplanar CT image reconstructions of the cervical spine were also generated.  COMPARISON:  None.  FINDINGS: CT HEAD FINDINGS  No skull fracture is noted. Paranasal sinuses and mastoid air cells are unremarkable. There is skull swelling and subcutaneous stranding in left occipital  region. Subcutaneous hematoma in this region measures about 2.3 cm.  No intracranial hemorrhage, mass effect or midline shift. Atherosclerotic calcifications of carotid siphon are noted. Mild cerebral atrophy is noted. Tiny lacunar infarct is noted in right basal ganglia. No acute cortical infarction. No mass lesion is noted on this unenhanced scan.  CT CERVICAL SPINE FINDINGS  Axial images of the cervical spine shows no acute fracture or subluxation. Computer processed images shows no acute fracture or subluxation. Degenerative changes are noted C1-C2 articulation. Mild disc space flattening with anterior spurring at C5-C6 level. No prevertebral soft tissue swelling. Cervical airway is patent. There is no pneumothorax in visualized left lung apex.  IMPRESSION: 1. No acute intracranial abnormality. There is scalp swelling and subcutaneous stranding in left occipital region. Subcutaneous hematoma in left occipital region measures 2.4 cm. Mild cerebral atrophy. 2. No cervical spine acute fracture or subluxation. Mild degenerative changes.   Electronically Signed  By: Natasha Mead M.D.   On: 07/12/2013 11:22   Dg Pelvis Portable  07/12/2013   CLINICAL DATA:  Fall, weakness and pain.  EXAM: PORTABLE PELVIS 1-2 VIEWS  COMPARISON:  CT abdomen and pelvis 03/21/2009.  FINDINGS: There is no evidence of pelvic fracture or diastasis. No other pelvic bone lesions are seen.  IMPRESSION: Negative exam.   Electronically Signed   By: Drusilla Kanner M.D.   On: 07/12/2013 11:09   Dg Chest Portable 1 View  07/12/2013   CLINICAL DATA:  Status post fall now with weakness history of dialysis dependent renal failure, and diabetes.  EXAM: PORTABLE CHEST - 1 VIEW  COMPARISON:  Dec 28, 2012.  FINDINGS: There is new blunting of the right lateral costophrenic angle. There is no pneumothorax. The observed portions of the overlying ribs appear normal. The left lung is well expanded and clear. The cardiac silhouette is mildly enlarged  though stable. The pulmonary vascularity is somewhat indistinct on the right.  IMPRESSION: There is a new right pleural effusion. This may be secondary to the patient's underlying cardiac and renal disease or reflect post traumatic processes. As best as can be determined the right ribs are intact. Followup chest CT scanning may be useful if the patient has experienced significant thoracic trauma.   Electronically Signed   By: David  [place]   On: 07/12/2013 11:07   Dg Humerus Left  07/12/2013   CLINICAL DATA:  Status post fall  EXAM: LEFT HUMERUS - 2+ VIEW  COMPARISON:  None.  FINDINGS: There is no evidence of fracture or other focal bone lesions. Soft tissues are unremarkable.  IMPRESSION: Negative.   Electronically Signed   By: Salome Holmes M.D.   On: 07/12/2013 11:54   Dg Hand Complete Left  07/12/2013   CLINICAL DATA:  Status post fall  EXAM: LEFT HAND - COMPLETE 3+ VIEW  COMPARISON:  None.  FINDINGS: The bones are osteopenic. Mild osteoarthritic changes identified within the proximal and distal interphalangeal joints. There is no evidence of acute fracture nor dislocation.  IMPRESSION: 1. Mild osteoarthritic changes. 2. No evidence of acute osseous abnormalities.   Electronically Signed   By: Salome Holmes M.D.   On: 07/12/2013 11:48    IMPRESSIONS:  1. Atrial fibrillation with RVR of unknown duration of onset. The duration of the onset of atrial fibrillation is unclear but she has had difficulty with dialysis and also has extensive evidence of diastolic dysfunction. Her HR remains elevated despite IV Cardizem 2. Chronic diastolic heart failure likely worsened with atrial fibrillation - repeat echo with normal LVF - could not assess diastolic function due to rapid afib 3. Hypertensive heart disease  4. End-stage renal disease  5. Diabetes mellitus with complications of retinopathy, nephropathy, neuropathy  6. mild to moderate aortic stenosis  7. Thrombocytopenia on intravenous heparin  8.  Weight loss  9. Significant peripheral edema -may have elements of nephrotic syndrome with markedly low albumin  10. Malnutrition  11. Coronary atherosclerosis and aortic atherosclerosis as manifested by coronary calcification on CT scan in. Recent catheterization 5 years ago did not show obstructive disease by history  12. Tobacco abuse with need to stop smoking  13. Significant lumbar disc disease and low back pain  RECOMMENDATION:  1. She'll need to be anticoagulated long term. Her CHADS2VASC score is 4 at this time. Continue warfarin in the hospital without coverage with heparin  2. Her HR is still poorly controlled despite metoprolol. Her BP was low yesterday  after HD but now is in the 130/60's. Will titrate IV Cardizem first and if HR still elevated may need to consider adding IV Amio for rate control. Would not use dig in setting of renal failure.  3. Because she lives in [place], we'll need to make arrangements to have her pro time followed in that area.  4. Obtain records of previous cardiology evaluation including catheterization 5 years ago.  5. Smoking cessation       Quintella Reichert, MD  07/15/2013  9:11 AM

## 2013-07-16 DIAGNOSIS — E1129 Type 2 diabetes mellitus with other diabetic kidney complication: Secondary | ICD-10-CM

## 2013-07-16 DIAGNOSIS — Z992 Dependence on renal dialysis: Secondary | ICD-10-CM

## 2013-07-16 DIAGNOSIS — Z7901 Long term (current) use of anticoagulants: Secondary | ICD-10-CM

## 2013-07-16 LAB — IRON AND TIBC: TIBC: 89 ug/dL — ABNORMAL LOW (ref 250–470)

## 2013-07-16 LAB — PREALBUMIN: Prealbumin: 13.5 mg/dL — ABNORMAL LOW (ref 17.0–34.0)

## 2013-07-16 LAB — PROTIME-INR: Prothrombin Time: 17.3 seconds — ABNORMAL HIGH (ref 11.6–15.2)

## 2013-07-16 MED ORDER — WARFARIN SODIUM 7.5 MG PO TABS
7.5000 mg | ORAL_TABLET | Freq: Once | ORAL | Status: AC
  Administered 2013-07-16: 7.5 mg via ORAL
  Filled 2013-07-16: qty 1

## 2013-07-16 MED ORDER — DILTIAZEM HCL 60 MG PO TABS
60.0000 mg | ORAL_TABLET | Freq: Four times a day (QID) | ORAL | Status: DC
  Administered 2013-07-16 – 2013-07-18 (×10): 60 mg via ORAL
  Filled 2013-07-16 (×12): qty 1

## 2013-07-16 NOTE — Progress Notes (Signed)
SUBJECTIVE:  Wants to go home  OBJECTIVE:   Vitals:   Filed Vitals:   07/16/13 0314 07/16/13 0442 07/16/13 0600 07/16/13 0800  BP:  119/54 123/55 134/73  Pulse:  94 76 104  Temp:  98.1 F (36.7 C)    TempSrc:  Oral    Resp: 17 11 13 18   Height:      Weight:      SpO2:  88% 90% 98%   I&O's:   Intake/Output Summary (Last 24 hours) at 07/16/13 0842 Last data filed at 07/16/13 0800  Gross per 24 hour  Intake    590 ml  Output   2900 ml  Net  -2310 ml   TELEMETRY: Reviewed telemetry pt in afib with HR 90-110bpm:     PHYSICAL EXAM General: Well developed, well nourished, in no acute distress Head: Eyes PERRLA, No xanthomas.   Normal cephalic and atramatic  Lungs:   Clear bilaterally to auscultation and percussion. Heart:   Irregularly irregular S1 S2 Pulses are 2+ & equal. Abdomen: Bowel sounds are positive, abdomen soft and non-tender without masses Extremities:   No clubbing, cyanosis or edema.  DP +1 Neuro: Alert and oriented X 3. Psych:  Good affect, responds appropriately   LABS: Basic Metabolic Panel:  Recent Labs  16/10/96 0800  NA 134*  K 3.9  CL 96  CO2 26  GLUCOSE 94  BUN 15  CREATININE 3.00*  CALCIUM 7.9*  MG 1.8   Liver Function Tests: No results found for this basename: AST, ALT, ALKPHOS, BILITOT, PROT, ALBUMIN,  in the last 72 hours No results found for this basename: LIPASE, AMYLASE,  in the last 72 hours CBC:  Recent Labs  07/13/13 1315 07/15/13 0800  WBC 5.7 4.9  HGB 10.0* 9.9*  HCT 28.3* 29.1*  MCV 84.5 85.1  PLT 160 175   Cardiac Enzymes:  Recent Labs  07/13/13 1030  CKTOTAL 48   BNP: No components found with this basename: POCBNP,  D-Dimer: No results found for this basename: DDIMER,  in the last 72 hours Hemoglobin A1C: No results found for this basename: HGBA1C,  in the last 72 hours Fasting Lipid Panel: No results found for this basename: CHOL, HDL, LDLCALC, TRIG, CHOLHDL, LDLDIRECT,  in the last 72 hours Thyroid  Function Tests:  Recent Labs  07/13/13 1030  TSH 4.126   Anemia Panel:  Recent Labs  07/15/13 0800  VITAMINB12 1096*  FOLATE >20.0  FERRITIN 148  TIBC 89*  IRON 41*  RETICCTPCT 1.3   Coag Panel:   Lab Results  Component Value Date   INR 1.45 07/16/2013   INR 1.32 07/15/2013   INR 0.88 07/12/2013    RADIOLOGY: Ct Abdomen Pelvis Wo Contrast  07/12/2013   CLINICAL DATA:  Fall with head injury and altered level of consciousness.  EXAM: CT CHEST, ABDOMEN AND PELVIS WITHOUT CONTRAST  TECHNIQUE: Multidetector CT imaging of the chest, abdomen and pelvis was performed following the standard protocol without IV contrast.  COMPARISON:  Multiple exams, including 07/12/2013 and 03/21/2009.  FINDINGS: CT CHEST FINDINGS  2.0 cm partially calcified nodule the right thyroid lobe.  Coronary artery atherosclerosis with calcification of the aortic valve and mild cardiomegaly. Diffuse subcutaneous and mediastinal edema.  Moderate and likely loculated right pleural effusion with small left pleural effusion. Passive atelectasis noted. Suspected centrilobular emphysema.  CT ABDOMEN AND PELVIS FINDINGS  Diffuse subcutaneous edema with low grade mesenteric edema, all increased from prior. Trace fluid in the left paracolic gutter. Stable  low-density left adrenal mass compatible with adenoma. Mild renal atrophy. Pancreas unremarkable in non-contrast CT appearance.  Aortoiliac atherosclerotic vascular disease noted with scattered small retroperitoneal lymph nodes. Sigmoid diverticulosis. No dilated bowel. Small amount of free pelvic fluid.  IMPRESSION: 1. Diffuse subcutaneous, mediastinal, and mesenteric edema compatible with 3rd spacing of fluid. 2. Moderate right and small left pleural effusions. The right pleural effusion is probably loculated. 3. Right-sided thyroid nodule. Consider further evaluation with thyroid ultrasound. If patient is clinically hyperthyroid, consider nuclear medicine thyroid uptake and  scan. 4. Atherosclerosis. 5. Stable left adrenal adenoma. 6. Small amount of ascites. Please note that today's exam was performed without IV contrast and accordingly does not have high sensitivity for solid organ laceration. 7. Sigmoid diverticulosis. 8. Centrilobular emphysema.   Electronically Signed   By: Herbie Baltimore M.D.   On: 07/12/2013 12:33   Dg Chest 2 View  07/14/2013   CLINICAL DATA:  Recent fall with head injury and altered level of consciousness. Right pleural effusion.  EXAM: CHEST  2 VIEW  COMPARISON:  CT and radiographs 07/12/2013.  FINDINGS: The loculated right pleural effusion does not appear significantly changed. Adjacent right basilar airspace disease is stable. The left lung is clear. An azygos fissure is noted. The heart size and mediastinal contours are stable with aortic atherosclerosis  IMPRESSION: No significant change in loculated right pleural effusion and adjacent right basilar airspace disease.   Electronically Signed   By: Roxy Horseman M.D.   On: 07/14/2013 08:30   Dg Wrist Complete Left  07/12/2013   CLINICAL DATA:  Status post fall  EXAM: LEFT WRIST - COMPLETE 3+ VIEW  FINDINGS: There is no evidence of fracture or dislocation. There is no evidence of arthropathy or other focal bone abnormality. Soft tissues are unremarkable. The bones are osteopenic.  IMPRESSION: Osteopenia without evidence of acute osseous abnormalities.   Electronically Signed   By: Salome Holmes M.D.   On: 07/12/2013 11:53   Ct Head Wo Contrast  07/12/2013   CLINICAL DATA:  Fall  EXAM: CT HEAD WITHOUT CONTRAST  CT CERVICAL SPINE WITHOUT CONTRAST  TECHNIQUE: Multidetector CT imaging of the head and cervical spine was performed following the standard protocol without intravenous contrast. Multiplanar CT image reconstructions of the cervical spine were also generated.  COMPARISON:  None.  FINDINGS: CT HEAD FINDINGS  No skull fracture is noted. Paranasal sinuses and mastoid air cells are  unremarkable. There is skull swelling and subcutaneous stranding in left occipital region. Subcutaneous hematoma in this region measures about 2.3 cm.  No intracranial hemorrhage, mass effect or midline shift. Atherosclerotic calcifications of carotid siphon are noted. Mild cerebral atrophy is noted. Tiny lacunar infarct is noted in right basal ganglia. No acute cortical infarction. No mass lesion is noted on this unenhanced scan.  CT CERVICAL SPINE FINDINGS  Axial images of the cervical spine shows no acute fracture or subluxation. Computer processed images shows no acute fracture or subluxation. Degenerative changes are noted C1-C2 articulation. Mild disc space flattening with anterior spurring at C5-C6 level. No prevertebral soft tissue swelling. Cervical airway is patent. There is no pneumothorax in visualized left lung apex.  IMPRESSION: 1. No acute intracranial abnormality. There is scalp swelling and subcutaneous stranding in left occipital region. Subcutaneous hematoma in left occipital region measures 2.4 cm. Mild cerebral atrophy. 2. No cervical spine acute fracture or subluxation. Mild degenerative changes.   Electronically Signed   By: Natasha Mead M.D.   On: 07/12/2013 11:22  Ct Chest Wo Contrast  07/12/2013   CLINICAL DATA:  Fall with head injury and altered level of consciousness.  EXAM: CT CHEST, ABDOMEN AND PELVIS WITHOUT CONTRAST  TECHNIQUE: Multidetector CT imaging of the chest, abdomen and pelvis was performed following the standard protocol without IV contrast.  COMPARISON:  Multiple exams, including 07/12/2013 and 03/21/2009.  FINDINGS: CT CHEST FINDINGS  2.0 cm partially calcified nodule the right thyroid lobe.  Coronary artery atherosclerosis with calcification of the aortic valve and mild cardiomegaly. Diffuse subcutaneous and mediastinal edema.  Moderate and likely loculated right pleural effusion with small left pleural effusion. Passive atelectasis noted. Suspected centrilobular  emphysema.  CT ABDOMEN AND PELVIS FINDINGS  Diffuse subcutaneous edema with low grade mesenteric edema, all increased from prior. Trace fluid in the left paracolic gutter. Stable low-density left adrenal mass compatible with adenoma. Mild renal atrophy. Pancreas unremarkable in non-contrast CT appearance.  Aortoiliac atherosclerotic vascular disease noted with scattered small retroperitoneal lymph nodes. Sigmoid diverticulosis. No dilated bowel. Small amount of free pelvic fluid.  IMPRESSION: 1. Diffuse subcutaneous, mediastinal, and mesenteric edema compatible with 3rd spacing of fluid. 2. Moderate right and small left pleural effusions. The right pleural effusion is probably loculated. 3. Right-sided thyroid nodule. Consider further evaluation with thyroid ultrasound. If patient is clinically hyperthyroid, consider nuclear medicine thyroid uptake and scan. 4. Atherosclerosis. 5. Stable left adrenal adenoma. 6. Small amount of ascites. Please note that today's exam was performed without IV contrast and accordingly does not have high sensitivity for solid organ laceration. 7. Sigmoid diverticulosis. 8. Centrilobular emphysema.   Electronically Signed   By: Herbie Baltimore M.D.   On: 07/12/2013 12:33   Ct Cervical Spine Wo Contrast  07/12/2013   CLINICAL DATA:  Fall  EXAM: CT HEAD WITHOUT CONTRAST  CT CERVICAL SPINE WITHOUT CONTRAST  TECHNIQUE: Multidetector CT imaging of the head and cervical spine was performed following the standard protocol without intravenous contrast. Multiplanar CT image reconstructions of the cervical spine were also generated.  COMPARISON:  None.  FINDINGS: CT HEAD FINDINGS  No skull fracture is noted. Paranasal sinuses and mastoid air cells are unremarkable. There is skull swelling and subcutaneous stranding in left occipital region. Subcutaneous hematoma in this region measures about 2.3 cm.  No intracranial hemorrhage, mass effect or midline shift. Atherosclerotic calcifications of  carotid siphon are noted. Mild cerebral atrophy is noted. Tiny lacunar infarct is noted in right basal ganglia. No acute cortical infarction. No mass lesion is noted on this unenhanced scan.  CT CERVICAL SPINE FINDINGS  Axial images of the cervical spine shows no acute fracture or subluxation. Computer processed images shows no acute fracture or subluxation. Degenerative changes are noted C1-C2 articulation. Mild disc space flattening with anterior spurring at C5-C6 level. No prevertebral soft tissue swelling. Cervical airway is patent. There is no pneumothorax in visualized left lung apex.  IMPRESSION: 1. No acute intracranial abnormality. There is scalp swelling and subcutaneous stranding in left occipital region. Subcutaneous hematoma in left occipital region measures 2.4 cm. Mild cerebral atrophy. 2. No cervical spine acute fracture or subluxation. Mild degenerative changes.   Electronically Signed   By: Natasha Mead M.D.   On: 07/12/2013 11:22   Dg Pelvis Portable  07/12/2013   CLINICAL DATA:  Fall, weakness and pain.  EXAM: PORTABLE PELVIS 1-2 VIEWS  COMPARISON:  CT abdomen and pelvis 03/21/2009.  FINDINGS: There is no evidence of pelvic fracture or diastasis. No other pelvic bone lesions are seen.  IMPRESSION: Negative exam.  Electronically Signed   By: Drusilla Kanner M.D.   On: 07/12/2013 11:09   Dg Chest Portable 1 View  07/12/2013   CLINICAL DATA:  Status post fall now with weakness history of dialysis dependent renal failure, and diabetes.  EXAM: PORTABLE CHEST - 1 VIEW  COMPARISON:  Dec 28, 2012.  FINDINGS: There is new blunting of the right lateral costophrenic angle. There is no pneumothorax. The observed portions of the overlying ribs appear normal. The left lung is well expanded and clear. The cardiac silhouette is mildly enlarged though stable. The pulmonary vascularity is somewhat indistinct on the right.  IMPRESSION: There is a new right pleural effusion. This may be secondary to the  patient's underlying cardiac and renal disease or reflect post traumatic processes. As best as can be determined the right ribs are intact. Followup chest CT scanning may be useful if the patient has experienced significant thoracic trauma.   Electronically Signed   By: David  [place]   On: 07/12/2013 11:07   Dg Humerus Left  07/12/2013   CLINICAL DATA:  Status post fall  EXAM: LEFT HUMERUS - 2+ VIEW  COMPARISON:  None.  FINDINGS: There is no evidence of fracture or other focal bone lesions. Soft tissues are unremarkable.  IMPRESSION: Negative.   Electronically Signed   By: Salome Holmes M.D.   On: 07/12/2013 11:54   Dg Hand Complete Left  07/12/2013   CLINICAL DATA:  Status post fall  EXAM: LEFT HAND - COMPLETE 3+ VIEW  COMPARISON:  None.  FINDINGS: The bones are osteopenic. Mild osteoarthritic changes identified within the proximal and distal interphalangeal joints. There is no evidence of acute fracture nor dislocation.  IMPRESSION: 1. Mild osteoarthritic changes. 2. No evidence of acute osseous abnormalities.   Electronically Signed   By: Salome Holmes M.D.   On: 07/12/2013 11:48   IMPRESSIONS:  1. Atrial fibrillation with RVR of unknown duration of onset. The duration of the onset of atrial fibrillation is unclear but she has had difficulty with dialysis and also has extensive evidence of diastolic dysfunction. Her HR has improved after increasing IV Cardizem 2. Chronic diastolic heart failure likely worsened with atrial fibrillation - repeat echo with normal LVF - could not assess diastolic function due to rapid afib  3. Hypertensive heart disease  4. End-stage renal disease  5. Diabetes mellitus with complications of retinopathy, nephropathy, neuropathy  6. mild to moderate aortic stenosis  7. Thrombocytopenia on intravenous heparin  8. Weight loss  9. Significant peripheral edema -may have elements of nephrotic syndrome with markedly low albumin  10. Malnutrition  11. Coronary  atherosclerosis and aortic atherosclerosis as manifested by coronary calcification on CT scan in. Recent catheterization 5 years ago did not show obstructive disease by history  12. Tobacco abuse with need to stop smoking  13. Significant lumbar disc disease and low back pain  RECOMMENDATION:  1. She'll need to be anticoagulated long term. Her CHADS2VASC score is 4 at this time. Continue warfarin in the hospital without coverage with heparin  2. Change IV Cardizem to PO Cardizem 60mg  q6 hours 3. Because she lives in [place], we'll need to make arrangements to have her pro time followed in that area.  4. Obtain records of previous cardiology evaluation including catheterization 5 years ago.  5. Smoking cessation       Quintella Reichert, MD  07/16/2013  8:42 AM

## 2013-07-16 NOTE — Progress Notes (Signed)
  Assessment:  1 ESRD TTS w/ IDLHypotension  2 Volume overload  3 s/p Fall  4 Newly diagnosed Afib  5 Generalized weakness/FTT  Plan:  Will try to stay ahead of fluid situation and plan for HD in [place] as she is reluctant to have it again today.  I think waiting until Monday or Tuesday is unwise.  Subjective: Interval History: Good HD yesterday with 2.9 liters off; overall feels better today  Objective: Vital signs in last 24 hours: Temp:  [97.3 F (36.3 C)-98.1 F (36.7 C)] 98.1 F (36.7 C) (11/29 0900) Pulse Rate:  [76-131] 102 (11/29 0900) Resp:  [11-21] 16 (11/29 0900) BP: (98-142)/(52-79) 134/73 mmHg (11/29 0800) SpO2:  [88 %-99 %] 99 % (11/29 0900) Weight:  [90.5 kg (199 lb 8.3 oz)] 90.5 kg (199 lb 8.3 oz) (11/28 1154) Weight change:   Intake/Output from previous day: 11/28 0701 - 11/29 0700 In: 585 [P.O.:420; I.V.:165] Out: 2900  Intake/Output this shift: Total I/O In: 10 [I.V.:10] Out: -   General appearance: alert and cooperative Resp: clear to auscultation bilaterally Cardio: irregularly irregular rhythm w/ gr 2/6 sem at [facility] GI: soft, non-tender; bowel sounds normal; no masses,  no organomegaly Extremities: edema 2+ biolat Alert and appropriate  Lab Results:  Recent Labs  07/13/13 1315 07/15/13 0800  WBC 5.7 4.9  HGB 10.0* 9.9*  HCT 28.3* 29.1*  PLT 160 175   BMET:  Recent Labs  07/15/13 0800  NA 134*  K 3.9  CL 96  CO2 26  GLUCOSE 94  BUN 15  CREATININE 3.00*  CALCIUM 7.9*   No results found for this basename: PTH,  in the last 72 hours Iron Studies:  Recent Labs  07/15/13 0800  IRON 41*  TIBC 89*  FERRITIN 148   Studies/Results: No results found.  Scheduled: . albumin human  25 g Intravenous Once  . calcitRIOL  0.25 mcg Oral Q M,W,F  . calcium acetate  667 mg Oral TID WC  . diltiazem  60 mg Oral Q6H  . feeding supplement (NEPRO CARB STEADY)  237 mL Oral BID BM  . furosemide  80 mg Oral BID  . metoprolol tartrate  25 mg Oral  BID  . multivitamin  1 tablet Oral QHS  . pantoprazole  40 mg Oral Daily  . polyethylene glycol  17 g Oral Daily  . sodium chloride  3 mL Intravenous Q12H  . warfarin   Does not apply Once  . Warfarin - Pharmacist Dosing Inpatient   Does not apply q1800  . zolpidem  10 mg Oral QHS     LOS: 4 days   Analeah Brame C 07/16/2013,10:28 AM

## 2013-07-16 NOTE — Progress Notes (Signed)
Subjective: Pt had no acute events overnight. Achieved good rate control on cardizem gtt and no repeat hypotension after HD. Still frustrated has to be in hospital. No other complaints of SOB, CP, n/v/d.     Objective: Vital signs in last 24 hours: Filed Vitals:   07/16/13 0314 07/16/13 0442 07/16/13 0600 07/16/13 0800  BP:  119/54 123/55 134/73  Pulse:  94 76 104  Temp:  98.1 F (36.7 C)    TempSrc:  Oral    Resp: 17 11 13 18   Height:      Weight:      SpO2:  88% 90% 98%   Weight change:   Intake/Output Summary (Last 24 hours) at 07/16/13 1610 Last data filed at 07/16/13 0800  Gross per 24 hour  Intake    590 ml  Output   2900 ml  Net  -2310 ml    Constitutional: She appears well-developed and well-nourished. No distress. obese  HENT:  Mouth/Throat: Oropharynx is clear and moist. No oropharyngeal exudate.  Left occipital hematoma with tenderness to touch  Eyes: Conjunctivae and EOM are normal. Pupils are equal, round, and reactive to light.  Neck: Normal range of motion. Neck supple.  Cardiovascular: Normal rate. Murmur heard. Irregular rhythm  Pulmonary/Chest: Effort normal. No respiratory distress. She has no wheezes.  Poor air movement, decreased breath sound at bases R>L  Abdominal: Soft. Bowel sounds are normal.  Musculoskeletal: She exhibits 1+ edema Skin: She is not diaphoretic.  Abrasion on left hand and right wrist.  Diffuse ecchymosis all over UE Neuro:  A & O x3, 4/5 UE & LE strength Psychiatric: She has a normal mood and affect. Her behavior is normal.   Lab Results: Basic Metabolic Panel:  Recent Labs Lab 07/12/13 1008 07/15/13 0800  NA 134* 134*  K 3.3* 3.9  CL 89* 96  CO2 24 26  GLUCOSE 100* 94  BUN 37* 15  CREATININE 4.73* 3.00*  CALCIUM 7.8* 7.9*  MG  --  1.8   CBC:  Recent Labs Lab 07/12/13 1008  07/13/13 1315 07/15/13 0800  WBC 7.3  < > 5.7 4.9  NEUTROABS 5.1  --   --   --   HGB 12.3  < > 10.0* 9.9*  HCT 35.4*  < > 28.3* 29.1*   MCV 84.5  < > 84.5 85.1  PLT 210  < > 160 175  < > = values in this interval not displayed. Micro Results: No results found for this or any previous visit (from the past 240 hour(s)). Studies/Results:  Medications: I have reviewed the patient's current medications. Scheduled Meds: . albumin human  25 g Intravenous Once  . calcitRIOL  0.25 mcg Oral Q M,W,F  . calcium acetate  667 mg Oral TID WC  . feeding supplement (NEPRO CARB STEADY)  237 mL Oral BID BM  . furosemide  80 mg Oral BID  . metoprolol tartrate  25 mg Oral BID  . multivitamin  1 tablet Oral QHS  . pantoprazole  40 mg Oral Daily  . polyethylene glycol  17 g Oral Daily  . sodium chloride  3 mL Intravenous Q12H  . warfarin   Does not apply Once  . Warfarin - Pharmacist Dosing Inpatient   Does not apply q1800  . zolpidem  10 mg Oral QHS   Continuous Infusions: . diltiazem (CARDIZEM) infusion 10 mg/hr (07/16/13 9604)   PRN Meds:.sodium chloride, sodium chloride, albuterol, feeding supplement (NEPRO CARB STEADY), heparin, heparin, HYDROcodone-acetaminophen, lidocaine (PF), lidocaine-prilocaine,  pentafluoroprop-tetrafluoroeth Assessment/Plan: Principal Problem:   Dyspnea Active Problems:   Accidental fall on or from sidewalk curb   Weakness   Atrial fibrillation with RVR   Diabetes mellitus with end stage renal disease   ESRD on hemodialysis   Chronic diastolic heart failure   Long-term (current) use of anticoagulants   Protein-calorie malnutrition, severe  Assessment: 62 year old female with past medical history of ESRD on HD (TTS) since 03/2013, HTN, HL, T2DM, COPD, diastolic CHF, OSA, and GERD who presented on 11/25 with recent fall and found to be in atrial fibrillation with RVR and hypoxic with right loculated pleural effusion.   Atrial fibrillation with RVR: Pt with previously unknown afib and continues to be on afib poor control on metoprolol. Repeat echo pending. Etiology most likely 2/2 loss of atrial kick and  fluid status. TSH WNL. CHADSVC score of 4. Echo EF 75% but 2/2 significant concentric hypertropy and beat to beat variation, dilated atria bilaterally.  -cardiology following and greatly appreciate recs -transition off cardizem gtt to orals -Appreciate cardiology consult --> Following anticoagulation for 3-4 weeks consider initiation of amiodarone followed by cardioversion if she fails to convert back to sinus rhythm -coumadin   End Stage Renal Disease on HD - Pt on HD since 03/2013. Pt is oliguric (occasional leaks). She follows with ESRD on dialysis at the [facility] on TTS schedule.  -Continue Phoslo, calcitrol, and RENA-Vit -anemia panel most consistent with fe deficiency   Mechanical Fall with head injury and resulting hematoma due to weakness -  CT head negative for acute intracranial abnormality but evidence of subcutaneous hematoma (2.3 cm) in left occipital region. CT cervical spine without acute fracture or subluxation. XR left hand, humerus, and wrist without evidence of fracture or other focal bone lesions.  -Continue home norco Q6 PRN pain   Hypertension - Pt on home atenolol, clonidine, hydralazine and lisinopril. Pt with hypotension after HD.  -Hold atenolol 50 mg BID  -Hold lisinopril 20 mg BID  -Hold hydralazine 25 mg BID  -Hold clonidine 0.2 mg BID   Grade II Diastolic Heart Failure - Pt with last 2D-Echo on 12/03/12 with evidence of Grade 2 diastolic dysfunction with normal EF (65-70%) and no wall motion abnormalities. Also with mild to moderate aortic stenosis (possibly bicuspid). Pt reports she is oliguric on HD  and compliant with diuretic therapy however still with volume overload.  -Continue home PO 80 mg lasix   Complicated Diabetes Mellitus - Stable. No record of HbA1c on file. Complicated with peripheral neuropathy and nephropathy with progression to ESRD.  -Monitor glucose   Code: Full  Diet: Renal  DVT Ppx: Heparin  Dispo: Disposition is deferred  at this time, awaiting improvement of current medical problems.  Anticipated discharge in approximately 2-3 day(s).   The patient does have a current PCP Milana Obey, MD) and does need an [facility] hospital follow-up appointment after discharge.  The patient does not have transportation limitations that hinder transportation to clinic appointments.  .Services Needed at time of discharge: Y = Yes, Blank = No PT:   OT:   RN:   Equipment:   Other:     LOS: 4 days   Christen Bame, MD 07/16/2013, 8:32 AM

## 2013-07-16 NOTE — Progress Notes (Signed)
  Date: 07/16/2013  Patient name: Anita [name]  Medical record number: [id number]  Date of birth: Jan 08, 1951   This patient has been seen and the plan of care was discussed with the house staff. Please see their note for complete details. I concur with their findings with the following additions/corrections: Feels well today. Eager to go home. Appreciate Nephro and cards input. Cardizem to PO. Will need outpatient follow up with cards. HD planned for AM.  Jonah Blue, DO, FACP Faculty [facility] Residency Program 07/16/2013, 12:54 PM

## 2013-07-16 NOTE — Progress Notes (Signed)
ANTICOAGULATION CONSULT NOTE - Follow Up Consult  Pharmacy Consult for coumadin Indication: atrial fibrillation  Allergies  Allergen Reactions  . Doxycycline Nausea And Vomiting  . Lipitor [Atorvastatin] Nausea And Vomiting    Patient Measurements: Height: 5' 6.5" (168.9 cm) Weight: 187 lb 6.3 oz (85 kg) IBW/kg (Calculated) : 60.45  Vital Signs: Temp: 98.1 F (36.7 C) (11/29 1219) Temp src: Oral (11/29 1219) BP: 122/55 mmHg (11/29 1219) Pulse Rate: 65 (11/29 1219)  Labs:  Recent Labs  07/15/13 0500 07/15/13 0800 07/16/13 0547  HGB  --  9.9*  --   HCT  --  29.1*  --   PLT  --  175  --   LABPROT 16.1*  --  17.3*  INR 1.32  --  1.45  CREATININE  --  3.00*  --     Estimated Creatinine Clearance: 21.3 ml/min (by C-G formula based on Cr of 3).   Medications:  Scheduled:  . albumin human  25 g Intravenous Once  . calcitRIOL  0.25 mcg Oral Q M,W,F  . calcium acetate  667 mg Oral TID WC  . diltiazem  60 mg Oral Q6H  . feeding supplement (NEPRO CARB STEADY)  237 mL Oral BID BM  . furosemide  80 mg Oral BID  . metoprolol tartrate  25 mg Oral BID  . multivitamin  1 tablet Oral QHS  . pantoprazole  40 mg Oral Daily  . polyethylene glycol  17 g Oral Daily  . sodium chloride  3 mL Intravenous Q12H  . warfarin   Does not apply Once  . Warfarin - Pharmacist Dosing Inpatient   Does not apply q1800  . zolpidem  10 mg Oral QHS    Assessment: 62 y.o. female on coumadin for afib (no heparin bridge at this time - it was d/c with plt drop 11/26 to 77 -?lab error). Cards following. HIT panel pending. Plt back to 175. INR 1.45 - slow movement on 5mg  daily. No bleeding noted. Hgb 9.9. - watch.  Goal of Therapy:  INR 2-3 Monitor platelets by anticoagulation protocol: Yes   Plan:  -Coumadin 7.5mg  po today -Daily PT/INR   Christoper Fabian, PharmD, BCPS Clinical pharmacist, pager [phone] 07/16/2013  2:06 PM

## 2013-07-16 NOTE — Progress Notes (Signed)
Subjective: Patient states she is doing well this morning and wants to go home. She tolerated HD yesterday well with no immediate episodes of hypotension. She was able to get OOB and ambulate with PT yesterday also. Denies fevers, chills, SOB, CP, dyspnea.   Objective: Vital signs in last 24 hours: Filed Vitals:   07/16/13 0314 07/16/13 0442 07/16/13 0600 07/16/13 0800  BP:  119/54 123/55 134/73  Pulse:  94 76 104  Temp:  98.1 F (36.7 C)    TempSrc:  Oral    Resp: 17 11 13 18   Height:      Weight:      SpO2:  88% 90% 98%   Weight change:   Intake/Output Summary (Last 24 hours) at 07/16/13 0849 Last data filed at 07/16/13 0800  Gross per 24 hour  Intake    590 ml  Output   2900 ml  Net  -2310 ml   BP 134/73  Pulse 104  Temp(Src) 98.1 F (36.7 C) (Oral)  Resp 18  Ht 5' 6.5" (1.689 m)  Wt 90.5 kg (199 lb 8.3 oz)  BMI 31.72 kg/m2  SpO2 98% General appearance: alert, cooperative and no distress Eyes: conjunctiva/cornea clear, PERRL, EOMI Back: symmetric, no curvature. ROM normal. No CVA tenderness. Lungs: diminished breath sounds bibasilar Heart: irregularly irregular rhythm Abdomen: soft, non-tender; bowel sounds normal; no masses,  no organomegaly Extremities: edema 3+ pitting edema to knees Skin: senile purpura Neuro: strength UE 4/5, LE 3/5 (improving)  Lab Results: Basic Metabolic Panel:  Recent Labs Lab 07/12/13 1008 07/15/13 0800  NA 134* 134*  K 3.3* 3.9  CL 89* 96  CO2 24 26  GLUCOSE 100* 94  BUN 37* 15  CREATININE 4.73* 3.00*  CALCIUM 7.8* 7.9*  MG  --  1.8   Liver Function Tests:  Recent Labs Lab 07/12/13 1008  AST 17  ALT 23  ALKPHOS 67  BILITOT 0.3  PROT 5.4*  ALBUMIN 2.2*   No results found for this basename: LIPASE, AMYLASE,  in the last 168 hours No results found for this basename: AMMONIA,  in the last 168 hours CBC:  Recent Labs Lab 07/12/13 1008  07/13/13 1315 07/15/13 0800  WBC 7.3  < > 5.7 4.9  NEUTROABS 5.1  --    --   --   HGB 12.3  < > 10.0* 9.9*  HCT 35.4*  < > 28.3* 29.1*  MCV 84.5  < > 84.5 85.1  PLT 210  < > 160 175  < > = values in this interval not displayed. Cardiac Enzymes:  Recent Labs Lab 07/12/13 1008 07/13/13 1030  CKTOTAL  --  48  TROPONINI <0.30  --    BNP: No results found for this basename: PROBNP,  in the last 168 hours D-Dimer: No results found for this basename: DDIMER,  in the last 168 hours CBG:  Recent Labs Lab 07/12/13 1712  GLUCAP 83   Hemoglobin A1C: No results found for this basename: HGBA1C,  in the last 168 hours Fasting Lipid Panel: No results found for this basename: CHOL, HDL, LDLCALC, TRIG, CHOLHDL, LDLDIRECT,  in the last 168 hours Thyroid Function Tests:  Recent Labs Lab 07/13/13 1030  TSH 4.126  FREET4 0.88   Coagulation:  Recent Labs Lab 07/12/13 1008 07/15/13 0500 07/16/13 0547  LABPROT 11.8 16.1* 17.3*  INR 0.88 1.32 1.45   Anemia Panel:  Recent Labs Lab 07/15/13 0800  VITAMINB12 1096*  FOLATE >20.0  FERRITIN 148  TIBC 89*  IRON 41*  RETICCTPCT 1.3   Urine Drug Screen: Drugs of Abuse  No results found for this basename: labopia, cocainscrnur, labbenz, amphetmu, thcu, labbarb    Alcohol Level: No results found for this basename: ETH,  in the last 168 hours Urinalysis: No results found for this basename: COLORURINE, APPERANCEUR, LABSPEC, PHURINE, GLUCOSEU, HGBUR, BILIRUBINUR, KETONESUR, PROTEINUR, UROBILINOGEN, NITRITE, LEUKOCYTESUR,  in the last 168 hours  Micro Results: No results found for this or any previous visit (from the past 240 hour(s)). Studies/Results: No results found. Medications:  I have reviewed the patient's current medications. Prior to Admission:  Prescriptions prior to admission  Medication Sig Dispense Refill  . atenolol (TENORMIN) 50 MG tablet Take 50 mg by mouth 2 (two) times daily.       . calcitRIOL (ROCALTROL) 0.25 MCG capsule Take 0.25 mcg by mouth every Monday, Wednesday, and Friday.        . calcium acetate (PHOSLO) 667 MG capsule Take 667 mg by mouth 3 (three) times daily with meals.      Marland Kitchen Cinnamon ([employer] CINNAMON) 500 MG capsule Take 500 mg by mouth daily.      . cloNIDine (CATAPRES) 0.2 MG tablet Take 0.2 mg by mouth 2 (two) times daily.      . Cyanocobalamin (VITAMIN B 12 PO) Take 1,000 mg by mouth daily.      . furosemide (LASIX) 40 MG tablet Take 80 mg by mouth 2 (two) times daily.       . hydrALAZINE (APRESOLINE) 25 MG tablet Take 25 mg by mouth 2 (two) times daily.       Marland Kitchen HYDROcodone-acetaminophen (NORCO) 7.5-325 MG per tablet Take 1 tablet by mouth every 6 (six) hours as needed for pain.  30 tablet  0  . lisinopril (PRINIVIL,ZESTRIL) 20 MG tablet Take 20 mg by mouth 2 (two) times daily.       Marland Kitchen omeprazole (PRILOSEC) 20 MG capsule Take 20 mg by mouth daily as needed (acid reflux).       . polyethylene glycol powder (GLYCOLAX/MIRALAX) powder Take 17 g by mouth daily.  527 g  0  . zolpidem (AMBIEN) 10 MG tablet Take 10 mg by mouth at bedtime.        Scheduled: . albumin human  25 g Intravenous Once  . calcitRIOL  0.25 mcg Oral Q M,W,F  . calcium acetate  667 mg Oral TID WC  . diltiazem  60 mg Oral Q6H  . feeding supplement (NEPRO CARB STEADY)  237 mL Oral BID BM  . furosemide  80 mg Oral BID  . metoprolol tartrate  25 mg Oral BID  . multivitamin  1 tablet Oral QHS  . pantoprazole  40 mg Oral Daily  . polyethylene glycol  17 g Oral Daily  . sodium chloride  3 mL Intravenous Q12H  . warfarin   Does not apply Once  . Warfarin - Pharmacist Dosing Inpatient   Does not apply q1800  . zolpidem  10 mg Oral QHS   Continuous:  Scheduled Meds: . albumin human  25 g Intravenous Once  . calcitRIOL  0.25 mcg Oral Q M,W,F  . calcium acetate  667 mg Oral TID WC  . diltiazem  60 mg Oral Q6H  . feeding supplement (NEPRO CARB STEADY)  237 mL Oral BID BM  . furosemide  80 mg Oral BID  . metoprolol tartrate  25 mg Oral BID  . multivitamin  1 tablet Oral QHS  .  pantoprazole  40 mg  Oral Daily  . polyethylene glycol  17 g Oral Daily  . sodium chloride  3 mL Intravenous Q12H  . warfarin   Does not apply Once  . Warfarin - Pharmacist Dosing Inpatient   Does not apply q1800  . 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 Workup has revealed new onset afib (age indeterminate), bilateral pleural effusions, and weakness.   #Afib (new diagnosis, age indeterminate) with RVR  Newly diagnosed on admission. Patient's previous EKG in May 2014 with no evidence of afib and patient has no known history of afib. CHA2DS2-VASc score = 4 (4% annual stroke risk). Current 2D echo, LVEF 75%, LA dilation, RA moderate-severe dilation, increased PA psi ( ), dilated IVC, and unable to assess diastolic function due to afib with rapid rate.  - appreciated cardiology assistance in the management of this patient  - coumadin for afib (no heparin bridge)  - continue lopressor 25 BID  - change Cardizem IV to PO 60 mg Q6hours - If fails to convert to NSR, will consider amio followed by cardioversion after 3-4 weeks, per cardiology recs   # Pleural effusion  CT chest reveals likely loculated right pleural effusion and small left effusion. Remains afebrile without  leukocytosis. Most likely related to her CHF but differential still includes pna or possible occult malignancy, and less likely pneumoconiosis. Oxygen sats have improved and she no longer requires oxygen. Repeat CXR shows improved Left aeration and stable right loculated effusion.  - will continue to monitor   # ESRD  Patient continues to have signs of volume overload given her LE edema and crackles. Her dyspnea has improved and she has been weaned off wall oxygen. Tolerating HD well. - appreciate nephrology consult in the management of this patient  - renal diet and continue RENA-vit  - will continue home calcitriol, calcium acetate, and lasix   # HTN  History of long standing hypertension. Low BPs after HD. She received NS bolus' and home anti-hypertensives were held on admission. BP stable. Will continue to monitor.  -Hold atenolol 50 mg BID  -Hold lisinopril 20 mg BID  -Hold hydralazine 25 mg BID  -Hold clonidine 0.2 mg BID   #Adrenal adenoma  CT abd/pelvis revealed a stable density left adrenal mass consistent with adenoma.  - will arrange for recommendation of outpatient r/o pheochromocytoma and Cushing's.   #Thyroid nodule CT chest revealed a 2 cm calcified R thyroid nodule. Patient has a history of laryngeal neck mass excision. Currently euthyroid. TSH and Free T4, WNL  - will arrange for recommendation of outpatient FNA at discharge   # Malnutrition Patient describes weakness since beginning dialysis, approximately 2-3 months. She notes more weakness in her lower extremity than upper and has difficulty rising from bed in the mornings and reaching with her arms. Records in epic indicate a weight loss  251>>195 over past 5 months. Differential includes muscle atrophy 2/2 increased sedentary lifestyle vs. Polymyositis (less likely as CK WNL) vs. thyroid dysfunction (currently euthyroid)  - appreciate RD consult  - continue Nepro BID and RENA-vit  - PT recommendation for H/H PT on  non-HD days   #Dysphagia  Patient reported difficulty swallowing on admission, especially pills. She denies any vomiting secondary to swallowing and no progression in the nature of difficulty. SLP eval was obtained and revealed mild aspiration risk with no follow-up recommendations noted. Will continue to monitor.   #DM  Patient states she no longer is on medication for her diabetes. ESRD 2/2 DM. All documented random glucose levels <200 per epic. Will continue to monitor CBG. No utility in obtaining A1C on ESRD patients as normal levels not predictive of disease activity.  - monitor glucose   #COPD  Patient has 40 pack year smoking history and is current smoker.  - will provide smoking cessation counseling   #CHF  Grade II diastolic dysfunction with preserved EF 65-70%, via April 2014 echo. Current echo read with LVEF 75%, unable to assess diastolic function. She has s/s of volume overload seen with B/L pleural effusions and increased LE edema.  - continue lasix  - will continue to follow along with cardiology   #GERD  Well controlled history of reflux with no complaints on admission  - continue PPI   #Constipation  Patient notes she developed significant constipation s/p initiation of dialysis. She takes miralax at home daily with good response. Will resume home med and monitor.  - continue miralax   # Acute Thrombocytopenia  Patient was started on heparin for afib 11/25 on admission with drop in PLTC 210>>77. Patient is not heparin naive as she has been a dialysis patient since August. HIT suspected with a 4T score = 4 (2 pts for >50% fall in PLTC count with a nadir <100,000 and 2 pts for onset of fall <=1day if heparin exposure within past 30 days). Other possibility remains lab error. Recheck of CBC revealed a PLTC of 160 and remains WNL.  - resolved  - will still f/u HIT panel   This is a Psychologist, occupational Note.  The care of the patient was discussed with Dr. Christen Bame and the  assessment and plan formulated with their assistance.  Please see their attached note for official documentation of the daily encounter.   LOS: 4 days   Lewie Chamber, Med Student 07/16/2013, 8:49 AM

## 2013-07-17 ENCOUNTER — Inpatient Hospital Stay (HOSPITAL_COMMUNITY): Payer: Medicare Other

## 2013-07-17 LAB — PROTIME-INR: Prothrombin Time: 21 seconds — ABNORMAL HIGH (ref 11.6–15.2)

## 2013-07-17 MED ORDER — LIDOCAINE HCL (PF) 1 % IJ SOLN: 5.0000 mL | INTRAMUSCULAR | Status: DC | PRN

## 2013-07-17 MED ORDER — SODIUM CHLORIDE 0.9 % IV SOLN: 100.0000 mL | INTRAVENOUS | Status: DC | PRN

## 2013-07-17 MED ORDER — AMIODARONE HCL 200 MG PO TABS
200.0000 mg | ORAL_TABLET | Freq: Two times a day (BID) | ORAL | Status: DC
  Administered 2013-07-17 – 2013-07-18 (×3): 200 mg via ORAL
  Filled 2013-07-17 (×4): qty 1

## 2013-07-17 MED ORDER — HEPARIN SODIUM (PORCINE) 1000 UNIT/ML DIALYSIS: 1000.0000 [IU] | INTRAMUSCULAR | Status: DC | PRN

## 2013-07-17 MED ORDER — NEPRO/CARBSTEADY PO LIQD
237.0000 mL | ORAL | Status: DC | PRN
  Filled 2013-07-17: qty 237

## 2013-07-17 MED ORDER — HEPARIN SODIUM (PORCINE) 1000 UNIT/ML DIALYSIS
20.0000 [IU]/kg | INTRAMUSCULAR | Status: DC | PRN
  Administered 2013-07-17: 1800 [IU] via INTRAVENOUS_CENTRAL

## 2013-07-17 MED ORDER — ALTEPLASE 2 MG IJ SOLR
2.0000 mg | Freq: Once | INTRAMUSCULAR | Status: DC | PRN
  Filled 2013-07-17: qty 2

## 2013-07-17 MED ORDER — WARFARIN SODIUM 5 MG PO TABS
5.0000 mg | ORAL_TABLET | Freq: Once | ORAL | Status: AC
  Administered 2013-07-17: 5 mg via ORAL
  Filled 2013-07-17: qty 1

## 2013-07-17 MED ORDER — PENTAFLUOROPROP-TETRAFLUOROETH EX AERO: 1.0000 "application " | INHALATION_SPRAY | CUTANEOUS | Status: DC | PRN

## 2013-07-17 MED ORDER — HYDROCODONE-ACETAMINOPHEN 5-325 MG PO TABS
ORAL_TABLET | ORAL | Status: AC
  Administered 2013-07-17: 1 via ORAL
  Filled 2013-07-17: qty 2

## 2013-07-17 MED ORDER — LIDOCAINE-PRILOCAINE 2.5-2.5 % EX CREA
1.0000 "application " | TOPICAL_CREAM | CUTANEOUS | Status: DC | PRN
  Filled 2013-07-17: qty 5

## 2013-07-17 NOTE — Progress Notes (Signed)
Assessment:  1 ESRD TTS w/ IDLHypotension  2 Volume overload/CHF  3 s/p Fall  4 Newly diagnosed Afib & anticoagulation 5 Generalized weakness/FTT   Plan:  Will try to stay ahead of fluid.  For dialysis today.  I did discuss with her that I thought she might need 4 days per week dialysis and she was not resistant to this. Will also stop Furosemide since no UOP.  Subjective: Interval History: Feels better.  Now on PO Cardizem.  Objective: Vital signs in last 24 hours: Temp:  [97.5 F (36.4 C)-98.1 F (36.7 C)] 97.9 F (36.6 C) (11/30 0354) Pulse Rate:  [51-119] 97 (11/29 2022) Resp:  [16-20] 16 (11/29 1400) BP: (108-134)/(52-73) 117/52 mmHg (11/29 2022) SpO2:  [96 %-100 %] 100 % (11/29 1219) Weight:  [85 kg (187 lb 6.3 oz)] 85 kg (187 lb 6.3 oz) (11/29 1300) Weight change: -8.5 kg (-18 lb 11.8 oz)  Intake/Output from previous day: 11/29 0701 - 11/30 0700 In: 10 [I.V.:10] Out: -  Intake/Output this shift:    General appearance: alert and cooperative Resp: diminished breath sounds bibasilar Cardio: irregularly irregular rhythm Extremities: edema 3+  Lab Results:  Recent Labs  07/15/13 0800  WBC 4.9  HGB 9.9*  HCT 29.1*  PLT 175   BMET:  Recent Labs  07/15/13 0800  NA 134*  K 3.9  CL 96  CO2 26  GLUCOSE 94  BUN 15  CREATININE 3.00*  CALCIUM 7.9*   No results found for this basename: PTH,  in the last 72 hours Iron Studies:  Recent Labs  07/15/13 0800  IRON 41*  TIBC 89*  FERRITIN 148   Studies/Results: No results found.  Scheduled: . albumin human  25 g Intravenous Once  . calcitRIOL  0.25 mcg Oral Q M,W,F  . calcium acetate  667 mg Oral TID WC  . diltiazem  60 mg Oral Q6H  . feeding supplement (NEPRO CARB STEADY)  237 mL Oral BID BM  . furosemide  80 mg Oral BID  . metoprolol tartrate  25 mg Oral BID  . multivitamin  1 tablet Oral QHS  . pantoprazole  40 mg Oral Daily  . polyethylene glycol  17 g Oral Daily  . sodium chloride  3 mL  Intravenous Q12H  . warfarin   Does not apply Once  . Warfarin - Pharmacist Dosing Inpatient   Does not apply q1800  . zolpidem  10 mg Oral QHS    LOS: 5 days   Anita [name] C 07/17/2013,7:41 AM

## 2013-07-17 NOTE — Progress Notes (Addendum)
ANTICOAGULATION CONSULT NOTE - Follow Up Consult  Pharmacy Consult for coumadin Indication: atrial fibrillation  Allergies  Allergen Reactions  . Doxycycline Nausea And Vomiting  . Lipitor [Atorvastatin] Nausea And Vomiting    Patient Measurements: Height: 5' 6.5" (168.9 cm) Weight: 194 lb 3.6 oz (88.1 kg) IBW/kg (Calculated) : 60.45  Vital Signs: Temp: 97.6 F (36.4 C) (11/30 1300) Temp src: Oral (11/30 1300) BP: 128/62 mmHg (11/30 1310) Pulse Rate: 131 (11/30 1300)  Labs:  Recent Labs  07/15/13 0500 07/15/13 0800 07/16/13 0547 07/17/13 0830  HGB  --  9.9*  --   --   HCT  --  29.1*  --   --   PLT  --  175  --   --   LABPROT 16.1*  --  17.3* 21.0*  INR 1.32  --  1.45 1.87*  CREATININE  --  3.00*  --   --     Estimated Creatinine Clearance: 21.7 ml/min (by C-G formula based on Cr of 3).  Assessment: 62 y.o. female on coumadin for afib (no heparin bridge at this time - it was d/c with plt drop 11/26 to 77 -?lab error). Cards following. HIT panel pending. Plt back to 175. INR 1.87 - better movement with increased dose yesterday. No bleeding noted. 11/28 Hgb 9.9. - watch. No bleeding noted. Addition of po amio 11/30 which will potentiate effects of warfarin.  Goal of Therapy:  INR 2-3 Monitor platelets by anticoagulation protocol: Yes   Plan:  -Coumadin 5mg  po today -Daily PT/INR  Christoper Fabian, PharmD, BCPS Clinical pharmacist, pager [phone] 07/17/2013  3:03 PM

## 2013-07-17 NOTE — Progress Notes (Addendum)
  Date: 07/17/2013  Patient name: Anita [name]  Medical record number: [id number]  Date of birth: 02-07-1951   This patient has been seen and the plan of care was discussed with the house staff. Please see their note for complete details. I concur with their findings with the following additions/corrections: Currently receiving HD. She is eager to be discharged home.  Denies CP or SOB at this time. Denies cough. Cardiology and Nephrology following. -Afib w/RVR: Continue Warfarin. Amiodarone added today, continue dilt 60 mg po q6hrs. Needs better control. She has some acute on chronic diastolic CHF as a result of uncontrolled Afib. -Right pleural effusion: loculated? Afebrile, no cough, no leukocytosis. Repeat CXR in AM to see if improved with HD.  -ESRD: Nephro following HD today. She will likely need HD 4 days per week.  -Adrenal incidentaloma: She will need outpatient follow up for this including evaluation for functioning adenoma. -Right thyroid nodule: She will need outpatient evaluation for consideration of biopsy given hx of laryngeal mass excision.  Dispo pending cardiology and nephrology final plans.  Jonah Blue, DO, FACP Faculty [facility] 07/17/2013, 12:08 PM

## 2013-07-17 NOTE — Progress Notes (Signed)
I have seen the patient and reviewed the daily progress note by Dedra Skeens MS IV and discussed the care of the patient with them.  See below for documentation of my findings, assessment, and plans.  Subjective: Undergoing HD, requests to go home, states that she feels better. Continues to have urine leakage and weeping from her lower right leg. Denies cough, chest pain, SOB.   Objective: Vital signs in last 24 hours: Filed Vitals:   07/17/13 1100 07/17/13 1130 07/17/13 1200 07/17/13 1230  BP: 153/70 145/73 128/64 108/47  Pulse: 99 107 108 118  Temp:      TempSrc:      Resp: 13 13 21 19   Height:      Weight:      SpO2: 98% 97% 96% 98%   Weight change: -18 lb 11.8 oz (-8.5 kg) No intake or output data in the 24 hours ending 07/17/13 1250 Vitals reviewed. General: resting in HD bed, in NAD HEENT: Pale with mild scleral icterus, tenderness to palpation of left occipital hematoma Cardiac: Irregularly irregular, no rubs, murmurs or gallops appreciated Pulm: Decreased breath sounds at the bases, no wheezes, rales, or rhonchi Abd: soft, nontender, nondistended, BS present Ext: warm and well perfused, 3+ pitting edema bilaterally up to her thighs, chronic skin changes. Abrasion on left hand and right wrist with no signs of infection.  Neuro: alert and oriented X3, no focal deficits, following commands, moves all 4 extremities voluntarily.    Lab Results: Reviewed and documented in Electronic Record Micro Results: Reviewed and documented in Electronic Record Studies/Results: Reviewed and documented in Electronic Record  Medications: I have reviewed the patient's current medications. Scheduled Meds: . albumin human  25 g Intravenous Once  . amiodarone  200 mg Oral BID  . calcitRIOL  0.25 mcg Oral Q M,W,F  . calcium acetate  667 mg Oral TID WC  . diltiazem  60 mg Oral Q6H  . feeding supplement (NEPRO CARB STEADY)  237 mL Oral BID BM  . metoprolol tartrate  25 mg Oral BID  .  multivitamin  1 tablet Oral QHS  . pantoprazole  40 mg Oral Daily  . polyethylene glycol  17 g Oral Daily  . sodium chloride  3 mL Intravenous Q12H  . warfarin   Does not apply Once  . Warfarin - Pharmacist Dosing Inpatient   Does not apply q1800  . zolpidem  10 mg Oral QHS   Continuous Infusions:  PRN Meds:.sodium chloride, sodium chloride, albuterol, alteplase, feeding supplement (NEPRO CARB STEADY), heparin, heparin, HYDROcodone-acetaminophen, lidocaine (PF), lidocaine-prilocaine, pentafluoroprop-tetrafluoroeth Assessment/Plan: 62 year old female with past medical history of ESRD on HD (TTS) since 03/2013, HTN, HL, T2DM, COPD, diastolic CHF, OSA, and GERD who presented on 11/25 with recent fall and found to be in atrial fibrillation with RVR and hypoxia with right loculated pleural effusion.   A. Fib with RVR - New - Likely caused by fluid overload. TSH WNL. CHADSVC of 4. Echo EF 75% but 2/2 significant concentric hypertropy and beat to beat variation, dilated atria bilaterally. Remains on A. Fib despite metoprolol, cardizem drip not well tolerated with low BP. Has acute on chronic dCHF due to A.fib with RVR. Started on amio PO per Cardiology.  -Appreciate Cardiology recommendations.  -Continue warfarin, needs long-term anticoagulation  -Per cards: continue Cardizem 60mg  q6h, amiodarone 200mg  BID added today -Working on obtaining records from [facility] and [facility] (cath by Dr. Lynnea Ferrier)  ESRD - Pt on HD since 03/2013. Pt is oliguric (occasional  leaks). She follows with ESRD on dialysis at the [facility] on TTS schedule but will likely need 4 days/week HD.  -Continue Phoslo, calcitrol, and RENA-Vit  -anemia panel most consistent with fe deficiency  Mechanical Fall with head injury and resulting hematoma due to weakness - CT head negative for acute intracranial abnormality but evidence of subcutaneous hematoma (2.3 cm) in left occipital region. CT cervical spine without acute fracture  or subluxation. XR left hand, humerus, and wrist without evidence of fracture or other focal bone lesions.  -Continue home norco Q6 PRN pain   Hypertension - Pt on home atenolol, clonidine, hydralazine and lisinopril. Pt with hypotension after HD.  -Hold atenolol 50 mg BID  -Hold lisinopril 20 mg BID  -Hold hydralazine 25 mg BID  -Hold clonidine 0.2 mg BID    Grade II Diastolic Heart Failure - Pt with last 2D-Echo on 12/03/12 with evidence of Grade 2 diastolic dysfunction with normal EF (65-70%) and no wall motion abnormalities and mild to moderate AS. Acute on chronic dCHF during this hospitalization 2/2 A.fib with RVR.  -Discontinue home PO 80 mg lasix as she is oliguric, on HD as inpatient  Complicated Diabetes Mellitus - Stable. No record of HbA1c on file. Complicated with peripheral neuropathy and nephropathy with progression to ESRD.  -Monitor glucose   Adrenal adenoma - incidentally found, will need outpatient follow up.   Right thyroid nodule - She has hx of laryngeal mass excision and needs further outpatient evaluation of this thyroid nodule.    Code: Full  Diet: Renal  DVT Ppx: Heparin  Dispo: Disposition is deferred at this time, awaiting improvement of current medical problems.  Anticipated discharge in approximately 1-2 day(s).   The patient does have a current PCP Milana Obey, MD) and does not need an [facility] hospital follow-up appointment after discharge.  The patient does not have transportation limitations that hinder transportation to clinic appointments.  .Services Needed at time of discharge: Y = Yes, Blank = No PT:   OT:   RN:   Equipment:   Other:     LOS: 5 days   Ky Barban, MD 07/17/2013, 12:50 PM

## 2013-07-17 NOTE — Progress Notes (Addendum)
SUBJECTIVE:  Wants to go home but HR still 117bpm while lying down  OBJECTIVE:   Vitals:   Filed Vitals:   07/16/13 2022 07/16/13 2036 07/16/13 2300 07/17/13 0354  BP: 117/52     Pulse: 97     Temp:  97.7 F (36.5 C) 97.5 F (36.4 C) 97.9 F (36.6 C)  TempSrc:  Oral Oral Oral  Resp:      Height:      Weight:      SpO2:       I&O's:   Intake/Output Summary (Last 24 hours) at 07/17/13 0751 Last data filed at 07/16/13 0800  Gross per 24 hour  Intake     10 ml  Output      0 ml  Net     10 ml   TELEMETRY: Reviewed telemetry pt in afib with RVR up to 125bpm at rest     PHYSICAL EXAM General: Well developed, well nourished, in no acute distress Head: Eyes PERRLA, No xanthomas.   Normal cephalic and atramatic  Lungs:   Clear bilaterally to auscultation and percussion. Heart:   Irregularly irregular S1 S2 Pulses are 2+ & equal. Abdomen: Bowel sounds are positive, abdomen soft and non-tender without masses  Extremities:   No clubbing, cyanosis or edema.  DP +1 Neuro: Alert and oriented X 3. Psych:  Good affect, responds appropriately   LABS: Basic Metabolic Panel:  Recent Labs  16/10/96 0800  NA 134*  K 3.9  CL 96  CO2 26  GLUCOSE 94  BUN 15  CREATININE 3.00*  CALCIUM 7.9*  MG 1.8   Liver Function Tests: No results found for this basename: AST, ALT, ALKPHOS, BILITOT, PROT, ALBUMIN,  in the last 72 hours No results found for this basename: LIPASE, AMYLASE,  in the last 72 hours CBC:  Recent Labs  07/15/13 0800  WBC 4.9  HGB 9.9*  HCT 29.1*  MCV 85.1  PLT 175   Cardiac Enzymes: No results found for this basename: CKTOTAL, CKMB, CKMBINDEX, TROPONINI,  in the last 72 hours BNP: No components found with this basename: POCBNP,  D-Dimer: No results found for this basename: DDIMER,  in the last 72 hours Hemoglobin A1C: No results found for this basename: HGBA1C,  in the last 72 hours Fasting Lipid Panel: No results found for this basename: CHOL, HDL,  LDLCALC, TRIG, CHOLHDL, LDLDIRECT,  in the last 72 hours Thyroid Function Tests: No results found for this basename: TSH, T4TOTAL, FREET3, T3FREE, THYROIDAB,  in the last 72 hours Anemia Panel:  Recent Labs  07/15/13 0800  VITAMINB12 1096*  FOLATE >20.0  FERRITIN 148  TIBC 89*  IRON 41*  RETICCTPCT 1.3   Coag Panel:   Lab Results  Component Value Date   INR 1.45 07/16/2013   INR 1.32 07/15/2013   INR 0.88 07/12/2013    RADIOLOGY: Ct Abdomen Pelvis Wo Contrast  07/12/2013   CLINICAL DATA:  Fall with head injury and altered level of consciousness.  EXAM: CT CHEST, ABDOMEN AND PELVIS WITHOUT CONTRAST  TECHNIQUE: Multidetector CT imaging of the chest, abdomen and pelvis was performed following the standard protocol without IV contrast.  COMPARISON:  Multiple exams, including 07/12/2013 and 03/21/2009.  FINDINGS: CT CHEST FINDINGS  2.0 cm partially calcified nodule the right thyroid lobe.  Coronary artery atherosclerosis with calcification of the aortic valve and mild cardiomegaly. Diffuse subcutaneous and mediastinal edema.  Moderate and likely loculated right pleural effusion with small left pleural effusion. Passive atelectasis noted. Suspected  centrilobular emphysema.  CT ABDOMEN AND PELVIS FINDINGS  Diffuse subcutaneous edema with low grade mesenteric edema, all increased from prior. Trace fluid in the left paracolic gutter. Stable low-density left adrenal mass compatible with adenoma. Mild renal atrophy. Pancreas unremarkable in non-contrast CT appearance.  Aortoiliac atherosclerotic vascular disease noted with scattered small retroperitoneal lymph nodes. Sigmoid diverticulosis. No dilated bowel. Small amount of free pelvic fluid.  IMPRESSION: 1. Diffuse subcutaneous, mediastinal, and mesenteric edema compatible with 3rd spacing of fluid. 2. Moderate right and small left pleural effusions. The right pleural effusion is probably loculated. 3. Right-sided thyroid nodule. Consider further  evaluation with thyroid ultrasound. If patient is clinically hyperthyroid, consider nuclear medicine thyroid uptake and scan. 4. Atherosclerosis. 5. Stable left adrenal adenoma. 6. Small amount of ascites. Please note that today's exam was performed without IV contrast and accordingly does not have high sensitivity for solid organ laceration. 7. Sigmoid diverticulosis. 8. Centrilobular emphysema.   Electronically Signed   By: Herbie Baltimore M.D.   On: 07/12/2013 12:33   Dg Chest 2 View  07/14/2013   CLINICAL DATA:  Recent fall with head injury and altered level of consciousness. Right pleural effusion.  EXAM: CHEST  2 VIEW  COMPARISON:  CT and radiographs 07/12/2013.  FINDINGS: The loculated right pleural effusion does not appear significantly changed. Adjacent right basilar airspace disease is stable. The left lung is clear. An azygos fissure is noted. The heart size and mediastinal contours are stable with aortic atherosclerosis  IMPRESSION: No significant change in loculated right pleural effusion and adjacent right basilar airspace disease.   Electronically Signed   By: Roxy Horseman M.D.   On: 07/14/2013 08:30   Dg Wrist Complete Left  07/12/2013   CLINICAL DATA:  Status post fall  EXAM: LEFT WRIST - COMPLETE 3+ VIEW  FINDINGS: There is no evidence of fracture or dislocation. There is no evidence of arthropathy or other focal bone abnormality. Soft tissues are unremarkable. The bones are osteopenic.  IMPRESSION: Osteopenia without evidence of acute osseous abnormalities.   Electronically Signed   By: Salome Holmes M.D.   On: 07/12/2013 11:53   Ct Head Wo Contrast  07/12/2013   CLINICAL DATA:  Fall  EXAM: CT HEAD WITHOUT CONTRAST  CT CERVICAL SPINE WITHOUT CONTRAST  TECHNIQUE: Multidetector CT imaging of the head and cervical spine was performed following the standard protocol without intravenous contrast. Multiplanar CT image reconstructions of the cervical spine were also generated.  COMPARISON:   None.  FINDINGS: CT HEAD FINDINGS  No skull fracture is noted. Paranasal sinuses and mastoid air cells are unremarkable. There is skull swelling and subcutaneous stranding in left occipital region. Subcutaneous hematoma in this region measures about 2.3 cm.  No intracranial hemorrhage, mass effect or midline shift. Atherosclerotic calcifications of carotid siphon are noted. Mild cerebral atrophy is noted. Tiny lacunar infarct is noted in right basal ganglia. No acute cortical infarction. No mass lesion is noted on this unenhanced scan.  CT CERVICAL SPINE FINDINGS  Axial images of the cervical spine shows no acute fracture or subluxation. Computer processed images shows no acute fracture or subluxation. Degenerative changes are noted C1-C2 articulation. Mild disc space flattening with anterior spurring at C5-C6 level. No prevertebral soft tissue swelling. Cervical airway is patent. There is no pneumothorax in visualized left lung apex.  IMPRESSION: 1. No acute intracranial abnormality. There is scalp swelling and subcutaneous stranding in left occipital region. Subcutaneous hematoma in left occipital region measures 2.4 cm. Mild cerebral atrophy.  2. No cervical spine acute fracture or subluxation. Mild degenerative changes.   Electronically Signed   By: Natasha Mead M.D.   On: 07/12/2013 11:22   Ct Chest Wo Contrast  07/12/2013   CLINICAL DATA:  Fall with head injury and altered level of consciousness.  EXAM: CT CHEST, ABDOMEN AND PELVIS WITHOUT CONTRAST  TECHNIQUE: Multidetector CT imaging of the chest, abdomen and pelvis was performed following the standard protocol without IV contrast.  COMPARISON:  Multiple exams, including 07/12/2013 and 03/21/2009.  FINDINGS: CT CHEST FINDINGS  2.0 cm partially calcified nodule the right thyroid lobe.  Coronary artery atherosclerosis with calcification of the aortic valve and mild cardiomegaly. Diffuse subcutaneous and mediastinal edema.  Moderate and likely loculated right  pleural effusion with small left pleural effusion. Passive atelectasis noted. Suspected centrilobular emphysema.  CT ABDOMEN AND PELVIS FINDINGS  Diffuse subcutaneous edema with low grade mesenteric edema, all increased from prior. Trace fluid in the left paracolic gutter. Stable low-density left adrenal mass compatible with adenoma. Mild renal atrophy. Pancreas unremarkable in non-contrast CT appearance.  Aortoiliac atherosclerotic vascular disease noted with scattered small retroperitoneal lymph nodes. Sigmoid diverticulosis. No dilated bowel. Small amount of free pelvic fluid.  IMPRESSION: 1. Diffuse subcutaneous, mediastinal, and mesenteric edema compatible with 3rd spacing of fluid. 2. Moderate right and small left pleural effusions. The right pleural effusion is probably loculated. 3. Right-sided thyroid nodule. Consider further evaluation with thyroid ultrasound. If patient is clinically hyperthyroid, consider nuclear medicine thyroid uptake and scan. 4. Atherosclerosis. 5. Stable left adrenal adenoma. 6. Small amount of ascites. Please note that today's exam was performed without IV contrast and accordingly does not have high sensitivity for solid organ laceration. 7. Sigmoid diverticulosis. 8. Centrilobular emphysema.   Electronically Signed   By: Herbie Baltimore M.D.   On: 07/12/2013 12:33   Ct Cervical Spine Wo Contrast  07/12/2013   CLINICAL DATA:  Fall  EXAM: CT HEAD WITHOUT CONTRAST  CT CERVICAL SPINE WITHOUT CONTRAST  TECHNIQUE: Multidetector CT imaging of the head and cervical spine was performed following the standard protocol without intravenous contrast. Multiplanar CT image reconstructions of the cervical spine were also generated.  COMPARISON:  None.  FINDINGS: CT HEAD FINDINGS  No skull fracture is noted. Paranasal sinuses and mastoid air cells are unremarkable. There is skull swelling and subcutaneous stranding in left occipital region. Subcutaneous hematoma in this region measures about  2.3 cm.  No intracranial hemorrhage, mass effect or midline shift. Atherosclerotic calcifications of carotid siphon are noted. Mild cerebral atrophy is noted. Tiny lacunar infarct is noted in right basal ganglia. No acute cortical infarction. No mass lesion is noted on this unenhanced scan.  CT CERVICAL SPINE FINDINGS  Axial images of the cervical spine shows no acute fracture or subluxation. Computer processed images shows no acute fracture or subluxation. Degenerative changes are noted C1-C2 articulation. Mild disc space flattening with anterior spurring at C5-C6 level. No prevertebral soft tissue swelling. Cervical airway is patent. There is no pneumothorax in visualized left lung apex.  IMPRESSION: 1. No acute intracranial abnormality. There is scalp swelling and subcutaneous stranding in left occipital region. Subcutaneous hematoma in left occipital region measures 2.4 cm. Mild cerebral atrophy. 2. No cervical spine acute fracture or subluxation. Mild degenerative changes.   Electronically Signed   By: Natasha Mead M.D.   On: 07/12/2013 11:22   Dg Pelvis Portable  07/12/2013   CLINICAL DATA:  Fall, weakness and pain.  EXAM: PORTABLE PELVIS 1-2 VIEWS  COMPARISON:  CT abdomen and pelvis 03/21/2009.  FINDINGS: There is no evidence of pelvic fracture or diastasis. No other pelvic bone lesions are seen.  IMPRESSION: Negative exam.   Electronically Signed   By: Drusilla Kanner M.D.   On: 07/12/2013 11:09   Dg Chest Portable 1 View  07/12/2013   CLINICAL DATA:  Status post fall now with weakness history of dialysis dependent renal failure, and diabetes.  EXAM: PORTABLE CHEST - 1 VIEW  COMPARISON:  Dec 28, 2012.  FINDINGS: There is new blunting of the right lateral costophrenic angle. There is no pneumothorax. The observed portions of the overlying ribs appear normal. The left lung is well expanded and clear. The cardiac silhouette is mildly enlarged though stable. The pulmonary vascularity is somewhat  indistinct on the right.  IMPRESSION: There is a new right pleural effusion. This may be secondary to the patient's underlying cardiac and renal disease or reflect post traumatic processes. As best as can be determined the right ribs are intact. Followup chest CT scanning may be useful if the patient has experienced significant thoracic trauma.   Electronically Signed   By: David  [place]   On: 07/12/2013 11:07   Dg Humerus Left  07/12/2013   CLINICAL DATA:  Status post fall  EXAM: LEFT HUMERUS - 2+ VIEW  COMPARISON:  None.  FINDINGS: There is no evidence of fracture or other focal bone lesions. Soft tissues are unremarkable.  IMPRESSION: Negative.   Electronically Signed   By: Salome Holmes M.D.   On: 07/12/2013 11:54   Dg Hand Complete Left  07/12/2013   CLINICAL DATA:  Status post fall  EXAM: LEFT HAND - COMPLETE 3+ VIEW  COMPARISON:  None.  FINDINGS: The bones are osteopenic. Mild osteoarthritic changes identified within the proximal and distal interphalangeal joints. There is no evidence of acute fracture nor dislocation.  IMPRESSION: 1. Mild osteoarthritic changes. 2. No evidence of acute osseous abnormalities.   Electronically Signed   By: Salome Holmes M.D.   On: 07/12/2013 11:48   IMPRESSIONS:  1. Atrial fibrillation with RVR of unknown duration of onset. The duration of the onset of atrial fibrillation is unclear but she has had difficulty with dialysis and also has extensive evidence of diastolic dysfunction. Her HR is still not adequately controlled after changing to PO Cardizem.   2. Chronic diastolic heart failure likely worsened with atrial fibrillation - repeat echo with normal LVF - could not assess diastolic function due to rapid afib  3. Hypertensive heart disease  4. End-stage renal disease  5. Diabetes mellitus with complications of retinopathy, nephropathy, neuropathy  6. mild to moderate aortic stenosis  7. Thrombocytopenia on intravenous heparin  8. Weight loss  9.  Significant peripheral edema -may have elements of nephrotic syndrome with markedly low albumin  10. Malnutrition  11. Coronary atherosclerosis and aortic atherosclerosis as manifested by coronary calcification on CT scan in. Recent catheterization 5 years ago did not show obstructive disease by history  12. Tobacco abuse with need to stop smoking  13. Significant lumbar disc disease and low back pain  RECOMMENDATION:  1. She'll need to be anticoagulated long term. Her CHADS2VASC score is 4 at this time. Continue warfarin in the hospital without coverage with heparin  2. Continue Cardizem 60mg  q6 hours  3. Add Amiodarone 200mg  BID for better rate control since borderline low BP does not allow for uptitration of Cardizem 4. Obtain records of previous cardiology evaluation including catheterization 5 years ago.  5. Smoking  cessation        Quintella Reichert, MD  07/17/2013  7:51 AM

## 2013-07-17 NOTE — Progress Notes (Signed)
Subjective: Patient resting comfortably in bed receiving dialysis on the HD unit. She denies any complaints this morning. Notes some continued urine leakage and ongoing swelling in her LE. Still anxious to go home when ready.   Objective: Vital signs in last 24 hours: Filed Vitals:   07/16/13 2300 07/17/13 0354 07/17/13 0800 07/17/13 0906  BP:   137/80 110/36  Pulse:   105 78  Temp: 97.5 F (36.4 C) 97.9 F (36.6 C) 97.7 F (36.5 C) 97.5 F (36.4 C)  TempSrc: Oral Oral Oral Oral  Resp:   16 17  Height:      Weight:    91.8 kg (202 lb 6.1 oz)  SpO2:   99% 99%   Weight change: -8.5 kg (-18 lb 11.8 oz) No intake or output data in the 24 hours ending 07/17/13 0927 BP 123/61  Pulse 89  Temp(Src) 97.5 F (36.4 C) (Oral)  Resp 13  Ht 5' 6.5" (1.689 m)  Wt 91.8 kg (202 lb 6.1 oz)  BMI 32.18 kg/m2  SpO2 96% General appearance: alert, cooperative and no distress Head: Normocephalic, without obvious abnormality, atraumatic Lungs: coarse breath sounds Heart: irregularly irregular rhythm Abdomen: soft, non-tender; bowel sounds normal; no masses,  no organomegaly Extremities: edema 3+ pitting edema to knees Skin: Skin color, texture, turgor normal. No rashes or lesions Lab Results: Basic Metabolic Panel:  Recent Labs Lab 07/12/13 1008 07/15/13 0800  NA 134* 134*  K 3.3* 3.9  CL 89* 96  CO2 24 26  GLUCOSE 100* 94  BUN 37* 15  CREATININE 4.73* 3.00*  CALCIUM 7.8* 7.9*  MG  --  1.8   Liver Function Tests:  Recent Labs Lab 07/12/13 1008  AST 17  ALT 23  ALKPHOS 67  BILITOT 0.3  PROT 5.4*  ALBUMIN 2.2*   No results found for this basename: LIPASE, AMYLASE,  in the last 168 hours No results found for this basename: AMMONIA,  in the last 168 hours CBC:  Recent Labs Lab 07/12/13 1008  07/13/13 1315 07/15/13 0800  WBC 7.3  < > 5.7 4.9  NEUTROABS 5.1  --   --   --   HGB 12.3  < > 10.0* 9.9*  HCT 35.4*  < > 28.3* 29.1*  MCV 84.5  < > 84.5 85.1  PLT 210  < >  160 175  < > = values in this interval not displayed. Cardiac Enzymes:  Recent Labs Lab 07/12/13 1008 07/13/13 1030  CKTOTAL  --  48  TROPONINI <0.30  --    BNP: No results found for this basename: PROBNP,  in the last 168 hours D-Dimer: No results found for this basename: DDIMER,  in the last 168 hours CBG:  Recent Labs Lab 07/12/13 1712  GLUCAP 83   Hemoglobin A1C: No results found for this basename: HGBA1C,  in the last 168 hours Fasting Lipid Panel: No results found for this basename: CHOL, HDL, LDLCALC, TRIG, CHOLHDL, LDLDIRECT,  in the last 168 hours Thyroid Function Tests:  Recent Labs Lab 07/13/13 1030  TSH 4.126  FREET4 0.88   Coagulation:  Recent Labs Lab 07/12/13 1008 07/15/13 0500 07/16/13 0547  LABPROT 11.8 16.1* 17.3*  INR 0.88 1.32 1.45   Anemia Panel:  Recent Labs Lab 07/15/13 0800  VITAMINB12 1096*  FOLATE >20.0  FERRITIN 148  TIBC 89*  IRON 41*  RETICCTPCT 1.3   Urine Drug Screen: Drugs of Abuse  No results found for this basename: labopia, cocainscrnur, labbenz, amphetmu, thcu, labbarb  Alcohol Level: No results found for this basename: ETH,  in the last 168 hours Urinalysis: No results found for this basename: COLORURINE, APPERANCEUR, LABSPEC, PHURINE, GLUCOSEU, HGBUR, BILIRUBINUR, KETONESUR, PROTEINUR, UROBILINOGEN, NITRITE, LEUKOCYTESUR,  in the last 168 hours  Micro Results: No results found for this or any previous visit (from the past 240 hour(s)). Studies/Results: No results found. Medications:  I have reviewed the patient's current medications. Prior to Admission:  Prescriptions prior to admission  Medication Sig Dispense Refill  . atenolol (TENORMIN) 50 MG tablet Take 50 mg by mouth 2 (two) times daily.       . calcitRIOL (ROCALTROL) 0.25 MCG capsule Take 0.25 mcg by mouth every Monday, Wednesday, and Friday.       . calcium acetate (PHOSLO) 667 MG capsule Take 667 mg by mouth 3 (three) times daily with meals.       Marland Kitchen Cinnamon ([employer] CINNAMON) 500 MG capsule Take 500 mg by mouth daily.      . cloNIDine (CATAPRES) 0.2 MG tablet Take 0.2 mg by mouth 2 (two) times daily.      . Cyanocobalamin (VITAMIN B 12 PO) Take 1,000 mg by mouth daily.      . furosemide (LASIX) 40 MG tablet Take 80 mg by mouth 2 (two) times daily.       . hydrALAZINE (APRESOLINE) 25 MG tablet Take 25 mg by mouth 2 (two) times daily.       Marland Kitchen HYDROcodone-acetaminophen (NORCO) 7.5-325 MG per tablet Take 1 tablet by mouth every 6 (six) hours as needed for pain.  30 tablet  0  . lisinopril (PRINIVIL,ZESTRIL) 20 MG tablet Take 20 mg by mouth 2 (two) times daily.       Marland Kitchen omeprazole (PRILOSEC) 20 MG capsule Take 20 mg by mouth daily as needed (acid reflux).       . polyethylene glycol powder (GLYCOLAX/MIRALAX) powder Take 17 g by mouth daily.  527 g  0  . zolpidem (AMBIEN) 10 MG tablet Take 10 mg by mouth at bedtime.        Scheduled: . albumin human  25 g Intravenous Once  . amiodarone  200 mg Oral BID  . calcitRIOL  0.25 mcg Oral Q M,W,F  . calcium acetate  667 mg Oral TID WC  . diltiazem  60 mg Oral Q6H  . feeding supplement (NEPRO CARB STEADY)  237 mL Oral BID BM  . metoprolol tartrate  25 mg Oral BID  . multivitamin  1 tablet Oral QHS  . pantoprazole  40 mg Oral Daily  . polyethylene glycol  17 g Oral Daily  . sodium chloride  3 mL Intravenous Q12H  . warfarin   Does not apply Once  . Warfarin - Pharmacist Dosing Inpatient   Does not apply q1800  . zolpidem  10 mg Oral QHS   Continuous:  Scheduled Meds: . albumin human  25 g Intravenous Once  . amiodarone  200 mg Oral BID  . calcitRIOL  0.25 mcg Oral Q M,W,F  . calcium acetate  667 mg Oral TID WC  . diltiazem  60 mg Oral Q6H  . feeding supplement (NEPRO CARB STEADY)  237 mL Oral BID BM  . metoprolol tartrate  25 mg Oral BID  . multivitamin  1 tablet Oral QHS  . pantoprazole  40 mg Oral Daily  . polyethylene glycol  17 g Oral Daily  . sodium chloride  3 mL Intravenous Q12H    . warfarin   Does not apply  Once  . Warfarin - Pharmacist Dosing Inpatient   Does not apply q1800  . 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 Workup has revealed new onset afib (age indeterminate), bilateral pleural effusions, and weakness.   #Afib (new diagnosis, age indeterminate) with RVR  Newly diagnosed on admission. Patient's previous EKG in May 2014 with no evidence of afib and patient has no known history of afib. CHA2DS2-VASc score = 4 (4% annual stroke risk). Current 2D echo, LVEF 75%, LA dilation, RA moderate-severe dilation, increased PA psi ( ), dilated IVC, and unable to assess diastolic function due to afib with rapid rate.  - appreciated cardiology assistance in the management of this patient  - coumadin for afib with no hep bridge (sub-therapeutic INR) - continue lopressor 25 BID  - continue cardizem 60mg  PO Q6hours - add amiodarone 200mg  BID for better rate control per cardiology as BPs do not allow uptitrating Cardizem - If fails to convert to NSR, will consider amio followed by cardioversion after 3-4 weeks, per cardiology recs  - will obtain records of previous heart cath from [facility] (previously done by Dr. Lynnea Ferrier)  # Pleural effusion   CT chest reveals likely loculated right pleural effusion and small left effusion. Remains afebrile without leukocytosis. Most likely related to her CHF but differential still includes pna or possible occult malignancy, and less likely pneumoconiosis. Oxygen sats have improved and she no longer requires oxygen. Repeat CXR shows improved Left aeration and stable right loculated effusion.  - will continue to monitor   # ESRD  Patient continues to have signs of volume overload given her LE edema and crackles. Her dyspnea has improved and she has been weaned off wall oxygen. Tolerating HD well.  - appreciate nephrology consult in the management of this patient - may need HD 4 days per week for volume overload per nephrology and patient is amenable to this  - renal diet and continue RENA-vit  - will continue home calcitriol, calcium acetate - lasix discontinued per nephrology  # HTN  History of long standing hypertension. Low BPs after HD. She received NS bolus' and home anti-hypertensives were held on admission. BP stable. Will continue to monitor.  -Hold atenolol 50 mg BID  -Hold lisinopril 20 mg BID  -Hold hydralazine 25 mg BID  -Hold clonidine 0.2 mg BID   #Adrenal adenoma  CT abd/pelvis revealed a stable density left adrenal mass consistent with adenoma.  - will arrange for recommendation of outpatient r/o pheochromocytoma and Cushing's.   #Thyroid nodule  CT chest revealed a 2 cm calcified R thyroid nodule. Patient has a history of laryngeal neck mass excision. Currently euthyroid. TSH and Free T4, WNL  - will arrange for recommendation of outpatient FNA at discharge   # Malnutrition  Patient describes weakness since beginning dialysis, approximately 2-3 months.  She notes more weakness in her lower extremity than upper and has difficulty rising from bed in the mornings and reaching with her arms. Records in epic indicate a weight loss 251>>195 over past 5 months. Differential includes  muscle atrophy 2/2 increased sedentary lifestyle vs. Polymyositis (less likely as CK WNL) vs. thyroid dysfunction (currently euthyroid)  - appreciate RD consult  - continue Nepro BID and RENA-vit  - PT recommendation for H/H PT on non-HD days   #Dysphagia  Patient reported difficulty swallowing on admission, especially pills. She denies any vomiting secondary to swallowing and no progression in the nature of difficulty. SLP eval was obtained and revealed mild aspiration risk with no follow-up recommendations noted. Will continue to monitor.   #DM  Patient states she no longer is on medication for her diabetes. ESRD 2/2 DM. All documented random glucose levels <200 per epic. Will continue to monitor CBG. No utility in obtaining A1C on ESRD patients as normal levels not predictive of disease activity.  - monitor glucose   #COPD  Patient has 40 pack year smoking history and is current smoker.  - will provide smoking cessation counseling   #CHF  Grade II diastolic dysfunction with preserved EF 65-70%, via April 2014 echo. Current echo read with LVEF 75%, unable to assess diastolic function. She has s/s of volume overload seen with B/L pleural effusions and increased LE edema.  - lasix d/c'd - will continue to follow along with cardiology   #GERD  Well controlled history of reflux with no complaints on admission  - continue PPI   #Constipation  Patient notes she developed significant constipation s/p initiation of dialysis. She takes miralax at home daily with good response. Will resume home med and monitor.  - continue miralax   # Acute Thrombocytopenia  Patient was started on heparin for afib 11/25 on admission with drop in PLTC 210>>77. Patient is not heparin naive as she has been a dialysis patient since August. HIT suspected with a 4T score = 4 (2 pts for >50% fall in PLTC count with a nadir <100,000 and 2 pts for onset of fall <=1day if heparin exposure within past 30 days). Other  possibility remains lab error. Recheck of CBC revealed a PLTC of 160 and remains WNL.  - resolved  - will still f/u HIT panel   This is a Psychologist, occupational Note.  The care of the patient was discussed with Dr. Sara Chu and the assessment and plan formulated with their assistance.  Please see their attached note for official documentation of the daily encounter.   LOS: 5 days   Lewie Chamber, Med Student 07/17/2013, 9:27 AM

## 2013-07-18 DIAGNOSIS — R296 Repeated falls: Secondary | ICD-10-CM

## 2013-07-18 DIAGNOSIS — N189 Chronic kidney disease, unspecified: Secondary | ICD-10-CM

## 2013-07-18 DIAGNOSIS — R0989 Other specified symptoms and signs involving the circulatory and respiratory systems: Secondary | ICD-10-CM

## 2013-07-18 DIAGNOSIS — J90 Pleural effusion, not elsewhere classified: Secondary | ICD-10-CM

## 2013-07-18 DIAGNOSIS — W19XXXA Unspecified fall, initial encounter: Secondary | ICD-10-CM

## 2013-07-18 DIAGNOSIS — S0083XA Contusion of other part of head, initial encounter: Secondary | ICD-10-CM

## 2013-07-18 DIAGNOSIS — S0003XA Contusion of scalp, initial encounter: Secondary | ICD-10-CM

## 2013-07-18 DIAGNOSIS — R5381 Other malaise: Secondary | ICD-10-CM

## 2013-07-18 DIAGNOSIS — I120 Hypertensive chronic kidney disease with stage 5 chronic kidney disease or end stage renal disease: Secondary | ICD-10-CM

## 2013-07-18 DIAGNOSIS — E43 Unspecified severe protein-calorie malnutrition: Secondary | ICD-10-CM

## 2013-07-18 DIAGNOSIS — R0609 Other forms of dyspnea: Secondary | ICD-10-CM

## 2013-07-18 LAB — HEPARIN INDUCED THROMBOCYTOPENIA PNL
Heparin Induced Plt Ab: NEGATIVE
UFH Low Dose 0.5 IU/mL: 0 % Release

## 2013-07-18 LAB — GLUCOSE, CAPILLARY: Glucose-Capillary: 350 mg/dL — ABNORMAL HIGH (ref 70–99)

## 2013-07-18 LAB — PROTIME-INR
INR: 2.12 — ABNORMAL HIGH (ref 0.00–1.49)
Prothrombin Time: 23.1 s — ABNORMAL HIGH (ref 11.6–15.2)

## 2013-07-18 MED ORDER — RENA-VITE PO TABS: 1.0000 | ORAL_TABLET | Freq: Every day | ORAL | Status: AC

## 2013-07-18 MED ORDER — WARFARIN SODIUM 7.5 MG PO TABS: 7.5000 mg | ORAL_TABLET | ORAL | Status: DC

## 2013-07-18 MED ORDER — AMIODARONE HCL 200 MG PO TABS: 200.0000 mg | ORAL_TABLET | Freq: Two times a day (BID) | ORAL | Status: DC

## 2013-07-18 MED ORDER — WARFARIN SODIUM 7.5 MG PO TABS
7.5000 mg | ORAL_TABLET | ORAL | Status: DC
  Filled 2013-07-18: qty 1

## 2013-07-18 MED ORDER — INSULIN ASPART 100 UNIT/ML ~~LOC~~ SOLN: 0.0000 [IU] | Freq: Three times a day (TID) | SUBCUTANEOUS | Status: DC

## 2013-07-18 MED ORDER — DILTIAZEM HCL 60 MG PO TABS: 60.0000 mg | ORAL_TABLET | Freq: Four times a day (QID) | ORAL | Status: DC

## 2013-07-18 MED ORDER — METOPROLOL TARTRATE 25 MG PO TABS: 25.0000 mg | ORAL_TABLET | Freq: Two times a day (BID) | ORAL | Status: DC

## 2013-07-18 MED ORDER — WARFARIN SODIUM 5 MG PO TABS
5.0000 mg | ORAL_TABLET | Freq: Once | ORAL | Status: AC
  Administered 2013-07-18: 5 mg via ORAL
  Filled 2013-07-18: qty 1

## 2013-07-18 MED ORDER — WARFARIN SODIUM 5 MG PO TABS: 5.0000 mg | ORAL_TABLET | ORAL | Status: DC

## 2013-07-18 NOTE — Progress Notes (Signed)
Subjective: Patient states she is doing well today and continually feels better. She continues to be ready to go home when cleared. Denies any CP, SOB, new swelling, fevers, chills, or sweats. Notes that she would like to change dialysis centers to one that has a bed for her to lay supine in due to her chronic back pain not allowing her to sit in a chair and tolerate HD.  Objective: Vital signs in last 24 hours: Filed Vitals:   07/18/13 0333 07/18/13 0516 07/18/13 0616 07/18/13 0752  BP: 118/59 118/74  119/71  Pulse: 90   92  Temp: 98 F (36.7 C)   98.2 F (36.8 C)  TempSrc: Oral   Oral  Resp: 16   18  Height:      Weight:   89.4 kg (197 lb 1.5 oz)   SpO2: 99%   94%   Weight change: 6.8 kg (14 lb 15.9 oz)  Intake/Output Summary (Last 24 hours) at 07/18/13 0854 Last data filed at 07/18/13 0600  Gross per 24 hour  Intake    560 ml  Output   3165 ml  Net  -2605 ml   BP 104/66  Pulse 118  Temp(Src) 98.2 F (36.8 C) (Oral)  Resp 18  Ht 5' 6.5" (1.689 m)  Wt 89.4 kg (197 lb 1.5 oz)  BMI 31.34 kg/m2  SpO2 94% General appearance: alert, cooperative and no distress Head: Normocephalic, without obvious abnormality, atraumatic, minimal left occipital hematoma resolving Back: symmetric, no curvature. ROM normal. No CVA tenderness. Lungs: diminished breath sounds RLL and clear sounds LUL, LLL Heart: irregularly irregular rhythm Abdomen: soft, non-tender; bowel sounds normal; no masses,  no organomegaly Extremities: edema 3+ pitting edema to knees Skin: senile purpura Lab Results: Basic Metabolic Panel:  Recent Labs Lab 07/12/13 1008 07/15/13 0800  NA 134* 134*  K 3.3* 3.9  CL 89* 96  CO2 24 26  GLUCOSE 100* 94  BUN 37* 15  CREATININE 4.73* 3.00*  CALCIUM 7.8* 7.9*  MG  --  1.8   Liver Function Tests:  Recent Labs Lab 07/12/13 1008  AST 17  ALT 23  ALKPHOS 67  BILITOT 0.3  PROT 5.4*  ALBUMIN 2.2*   No results found for this basename: LIPASE, AMYLASE,  in  the last 168 hours No results found for this basename: AMMONIA,  in the last 168 hours CBC:  Recent Labs Lab 07/12/13 1008  07/13/13 1315 07/15/13 0800  WBC 7.3  < > 5.7 4.9  NEUTROABS 5.1  --   --   --   HGB 12.3  < > 10.0* 9.9*  HCT 35.4*  < > 28.3* 29.1*  MCV 84.5  < > 84.5 85.1  PLT 210  < > 160 175  < > = values in this interval not displayed. Cardiac Enzymes:  Recent Labs Lab 07/12/13 1008 07/13/13 1030  CKTOTAL  --  48  TROPONINI <0.30  --    BNP: No results found for this basename: PROBNP,  in the last 168 hours D-Dimer: No results found for this basename: DDIMER,  in the last 168 hours CBG:  Recent Labs Lab 07/12/13 1712  GLUCAP 83   Hemoglobin A1C: No results found for this basename: HGBA1C,  in the last 168 hours Fasting Lipid Panel: No results found for this basename: CHOL, HDL, LDLCALC, TRIG, CHOLHDL, LDLDIRECT,  in the last 168 hours Thyroid Function Tests:  Recent Labs Lab 07/13/13 1030  TSH 4.126  FREET4 0.88   Coagulation:  Recent Labs Lab 07/15/13 0500 07/16/13 0547 07/17/13 0830 07/18/13 0625  LABPROT 16.1* 17.3* 21.0* 23.1*  INR 1.32 1.45 1.87* 2.12*   Anemia Panel:  Recent Labs Lab 07/15/13 0800  VITAMINB12 1096*  FOLATE >20.0  FERRITIN 148  TIBC 89*  IRON 41*  RETICCTPCT 1.3   Urine Drug Screen: Drugs of Abuse  No results found for this basename: labopia, cocainscrnur, labbenz, amphetmu, thcu, labbarb    Alcohol Level: No results found for this basename: ETH,  in the last 168 hours Urinalysis: No results found for this basename: COLORURINE, APPERANCEUR, LABSPEC, PHURINE, GLUCOSEU, HGBUR, BILIRUBINUR, KETONESUR, PROTEINUR, UROBILINOGEN, NITRITE, LEUKOCYTESUR,  in the last 168 hours  Micro Results: No results found for this or any previous visit (from the past 240 hour(s)). Studies/Results: Dg Chest 2 View  07/17/2013   CLINICAL DATA:  62 year old female with right pleural effusion. Initial encounter.  EXAM:  CHEST  2 VIEW  COMPARISON:  07/14/2013 and earlier.  FINDINGS: Semi upright AP and lateral views of the chest. Continued moderate right pleural effusion with associated right basilar opacity, stable. Small left pleural effusion also is stable. Stable cardiomegaly and mediastinal contours. No pneumothorax or pulmonary edema. Stable visualized osseous structures.  IMPRESSION: Unchanged moderate right and small left pleural effusions.   Electronically Signed   By: Augusto Gamble M.D.   On: 07/17/2013 21:31   Medications:  I have reviewed the patient's current medications. Prior to Admission:  Prescriptions prior to admission  Medication Sig Dispense Refill  . atenolol (TENORMIN) 50 MG tablet Take 50 mg by mouth 2 (two) times daily.       . calcitRIOL (ROCALTROL) 0.25 MCG capsule Take 0.25 mcg by mouth every Monday, Wednesday, and Friday.       . calcium acetate (PHOSLO) 667 MG capsule Take 667 mg by mouth 3 (three) times daily with meals.      Marland Kitchen Cinnamon ([facility] CINNAMON) 500 MG capsule Take 500 mg by mouth daily.      . cloNIDine (CATAPRES) 0.2 MG tablet Take 0.2 mg by mouth 2 (two) times daily.      . Cyanocobalamin (VITAMIN B 12 PO) Take 1,000 mg by mouth daily.      . furosemide (LASIX) 40 MG tablet Take 80 mg by mouth 2 (two) times daily.       . hydrALAZINE (APRESOLINE) 25 MG tablet Take 25 mg by mouth 2 (two) times daily.       Marland Kitchen HYDROcodone-acetaminophen (NORCO) 7.5-325 MG per tablet Take 1 tablet by mouth every 6 (six) hours as needed for pain.  30 tablet  0  . lisinopril (PRINIVIL,ZESTRIL) 20 MG tablet Take 20 mg by mouth 2 (two) times daily.       Marland Kitchen omeprazole (PRILOSEC) 20 MG capsule Take 20 mg by mouth daily as needed (acid reflux).       . polyethylene glycol powder (GLYCOLAX/MIRALAX) powder Take 17 g by mouth daily.  527 g  0  . zolpidem (AMBIEN) 10 MG tablet Take 10 mg by mouth at bedtime.        Scheduled: . albumin human  25 g Intravenous Once  . amiodarone  200 mg Oral BID  .  calcitRIOL  0.25 mcg Oral Q M,W,F  . calcium acetate  667 mg Oral TID WC  . diltiazem  60 mg Oral Q6H  . feeding supplement (NEPRO CARB STEADY)  237 mL Oral BID BM  . metoprolol tartrate  25 mg Oral BID  . multivitamin  1 tablet Oral QHS  . pantoprazole  40 mg Oral Daily  . polyethylene glycol  17 g Oral Daily  . sodium chloride  3 mL Intravenous Q12H  . warfarin  5 mg Oral ONCE-1800  . warfarin   Does not apply Once  . Warfarin - Pharmacist Dosing Inpatient   Does not apply q1800  . zolpidem  10 mg Oral QHS   Continuous:  Scheduled Meds: . albumin human  25 g Intravenous Once  . amiodarone  200 mg Oral BID  . calcitRIOL  0.25 mcg Oral Q M,W,F  . calcium acetate  667 mg Oral TID WC  . diltiazem  60 mg Oral Q6H  . feeding supplement (NEPRO CARB STEADY)  237 mL Oral BID BM  . metoprolol tartrate  25 mg Oral BID  . multivitamin  1 tablet Oral QHS  . pantoprazole  40 mg Oral Daily  . polyethylene glycol  17 g Oral Daily  . sodium chloride  3 mL Intravenous Q12H  . warfarin   Does not apply Once  . Warfarin - Pharmacist Dosing Inpatient   Does not apply q1800  . 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 Workup has revealed new onset afib (age indeterminate), bilateral pleural effusions, and weakness.   #Afib (new diagnosis, age indeterminate) with RVR  Newly diagnosed on admission. Patient's previous EKG in May 2014 with no  evidence of afib and patient has no known history of afib. CHA2DS2-VASc score = 4 (4% annual stroke risk). Current 2D echo, LVEF 75%, LA dilation, RA moderate-severe dilation, increased PA psi ( ), dilated IVC, and unable to assess diastolic function due to afib with rapid rate.  - ok for d/c from cardiology viewpoint - ok for d/c from nephrology standpoint per Dr. Arlean Hopping with continuation on same schedule TThSa - coumadin for afib (INR therapeutic) - continue lopressor 25 BID  - continue cardizem 60mg  PO Q6 hours - continue amiodarone 200mg  BID - cardiology f/u in 2 weeks, with possible cardioversion in future - will obtain records of previous heart cath [facility] (previously done by Dr. Lynnea Ferrier)   #Pleural effusion  CT chest reveals likely loculated right pleural effusion and small left effusion. Remains afebrile without leukocytosis.  Oxygen sats have improved and she no longer requires oxygen. Repeat CXR shows improved Left aeration and stable right loculated effusion.  - will continue to monitor   #ESRD  Patient continues to have signs of volume overload given her LE edema. Her dyspnea has improved and she has been weaned off wall oxygen. Tolerating HD well. Long conversation had with patient regarding her chronic back pain and her toleration of outpatient HD (e.g reclining chair). No option available of transferring to another facility with a stretcher (not feasible at all per social work). She is hesitant to ask staff for help with repositioning and after speaking to her HD center personally, there is  some question of her understanding of the importance of HD and that she sustains the entire treatment. Elaborate conversation had with patient to continue to try comfort support measures (e.g. egg crate foam and body pillows), as alternative options to outpatient dialysis (noturnal HD--not functional yet or home HD--eligibility issue) are not reasonable quite yet since she has  not been an HD patient long. - appreciate nephrology consult in the management of this patient  - continue TThSa HD schedule - renal diet and continue RENA-vit  - continue home calcitriol, calcium acetate  #HTN  History of long standing hypertension. Low BPs after HD. She received NS bolus' and home anti-hypertensives were held on admission. BP stable. Will continue to monitor.  -Hold atenolol 50 mg BID  -Hold lisinopril 20 mg BID  -Hold hydralazine 25 mg BID  -Hold clonidine 0.2 mg BID   #Adrenal adenoma  CT abd/pelvis revealed a stable density left adrenal mass consistent with adenoma.  - will need outpatient follow up for workup   #Thyroid nodule  CT chest revealed a 2 cm calcified R thyroid nodule. Patient has a history of laryngeal neck mass excision. Currently euthyroid. TSH and Free T4, WNL  - will need outpatient follow up for workup   # Malnutrition  Patient describes weakness since beginning dialysis, approximately 2-3 months. She notes more weakness in her lower extremity than upper and has difficulty rising from bed in the mornings and reaching with her arms. Records in epic indicate a weight loss 251>>195 over past 5 months. - appreciate RD consult  - continue Nepro BID and RENA-vit  - PT recommendation for H/H PT on non-HD days   #Dysphagia  Patient reported difficulty swallowing on admission, especially pills. She denies any vomiting secondary to swallowing and no progression in the nature of difficulty. SLP eval was obtained and revealed mild aspiration risk with no follow-up recommendations noted. Will continue to monitor.   #DM  Patient states she no longer is on medication for her diabetes. ESRD 2/2 DM. All documented random glucose levels <200 per epic. - monitor glucose   #COPD  Patient has 40 pack year smoking history and is current smoker.  - smoking cessation counseling provided  #CHF  Grade II diastolic dysfunction with preserved EF 65-70%, via April  2014 echo. Current echo read with LVEF 75%, unable to assess diastolic function. She has s/s of volume overload seen with B/L pleural effusions and increased LE edema.  - lasix d/c'd  - appreciate cardiology assistance   #GERD  Well controlled history of reflux with no complaints on admission  - continue PPI   #Constipation  Patient notes she developed significant constipation s/p initiation of dialysis. She takes miralax at home daily with good response. Will resume home med and monitor.  - continue miralax   # Acute Thrombocytopenia  Patient was started on heparin for afib 11/25 on admission with drop in PLTC 210>>77. Patient is not heparin naive as she has been a dialysis patient since August. HIT suspected with a 4T score = 4 (2 pts for >50% fall in PLTC count with a nadir <100,000 and 2 pts for onset of fall <=1day if heparin exposure within past 30 days). Other possibility remains lab error. Recheck of CBC revealed a PLTC of 160 and remains WNL.  - resolved  - will still f/u HIT panel   This is a Psychologist, occupational Note.  The care of the patient was discussed with Dr. Sara Chu and the assessment  and plan formulated with their assistance.  Please see their attached note for official documentation of the daily encounter.   LOS: 6 days   Lewie Chamber, Med Student 07/18/2013, 8:54 AM

## 2013-07-18 NOTE — Progress Notes (Signed)
Physical Therapy Treatment Patient Details Name: Anita [name] MRN: [id number] DOB: [birth year] Today's Date: 07/18/2013 Time: 0925-0950 PT Time Calculation (min): 25 min  PT Assessment / Plan / Recommendation  History of Present Illness Anita [name] is a 62 year old female with past medical history of ESRD on HD  (TTS) since [name], Anita [name], Anita [name], Anita [name], Anita [name], Anita [name], Anita [name], Anita GERD who presents with weakness Anita recent fall.   PT Comments   Pt admitted with above. Pt currently with functional limitations due to the weakness Anita endurance deficits. Noted significant weakness in hip musculature making sit to stand transition difficult.   Pt will benefit from skilled PT to increase their independence Anita safety with mobility to allow discharge to the venue listed below.   Follow Up Recommendations  Home health PT                 Equipment Recommendations  Rolling walker with 5" wheels        Frequency Min 3X/week   Progress towards PT Goals Progress towards PT goals: Progressing toward goals  Plan Current plan remains appropriate    Precautions / Restrictions Precautions Precautions: Fall Restrictions Weight Bearing Restrictions: No   Pertinent Vitals/Pain HR up to 138 bpm with activity, sats 86-94% on RA.  No pain.    Mobility  Bed Mobility Bed Mobility: Not assessed Transfers Transfers: Sit to Stand;Stand to Sit Sit to Stand: From chair/3-in-1;With armrests;With upper extremity assist;1: +1 Total assist Stand to Sit: 1: +1 Total assist;With upper extremity assist;With armrests;To chair/3-in-1 Details for Transfer Assistance: Pt was unable to get out of chair even with four attempts.  Pt states she pulls up on her husband's neck. Educated that this is not safe Anita not preferable however pt states thats how it works for her.  She also states she has a lift chair Anita executive chair to use as well at home.  Unsuccessful to even clear bottom off of chair last attempt.  First  three attempts, pt cleared bottom but did not have the power up to come to stand.  Pt states "I must just be tired from just getting off the potty." Ambulation/Gait Ambulation/Gait Assistance: Not tested (comment) Stairs: No Wheelchair Mobility Wheelchair Mobility: No    Exercises General Exercises - Lower Extremity Ankle Circles/Pumps: AROM;Both;5 reps;Seated Long Arc Quad: AROM;Both;10 reps;Seated Hip Flexion/Marching: AAROM;Both;10 reps;Seated   PT Goals (current goals can now be found in the care plan section)    Visit Information  Last PT Received On: 07/18/13 Assistance Needed: +2 (to get up if in chair) History of Present Illness: Anita [name] is a 62 year old female with past medical history of ESRD on HD  (TTS) since [name], Anita [name], Anita [name], Anita [name], Anita [name], Anita [name], Anita [name], Anita GERD who presents with weakness Anita recent fall.    Subjective Data  Subjective: "I feel so weak."   Cognition  Cognition Arousal/Alertness: Awake/alert Behavior During Therapy: WFL for tasks assessed/performed Overall Cognitive Status: Within Functional Limits for tasks assessed         End of Session PT - End of Session Equipment Utilized During Treatment: Gait belt Activity Tolerance: Patient limited by fatigue Patient left: in chair;with call bell/phone within reach Nurse Communication: Mobility status;Need for lift equipment        INGOLD,Carleen Rhue 07/18/2013, 10:06 AM [facility] [phone] (pager)

## 2013-07-18 NOTE — Progress Notes (Addendum)
Subjective:  No SOB, edema is down, c/o not being able to get here hydrocodone when she needs it.  Objective:  Vital Signs in the last 24 hours: BP 119/71  Pulse 92  Temp(Src) 98.2 F (36.8 C) (Oral)  Resp 18  Ht 5' 6.5" (1.689 m)  Wt 89.4 kg (197 lb 1.5 oz)  BMI 31.34 kg/m2  SpO2 94%  Physical Exam: Pleasant WF in NAD Lungs:  Reduced BS right base Cardiac:  irregular rhythm, normal S1 and S2, no S3, 1/6 systolic murmur Abdomen:  Soft, nontender, no masses Extremities:  1+ edema present  Intake/Output from previous day: 11/30 0701 - 12/01 0700 In: 560 [P.O.:560] Out: 3165  Weight Filed Weights   07/17/13 0906 07/17/13 1310 07/18/13 0616  Weight: 91.8 kg (202 lb 6.1 oz) 88.1 kg (194 lb 3.6 oz) 89.4 kg (197 lb 1.5 oz)    Lab Results:  PROTIME: Lab Results  Component Value Date   INR 2.12* 07/18/2013   INR 1.87* 07/17/2013   INR 1.45 07/16/2013    Telemetry: Atrial fibrillation still around 100-110  Assessment/Plan: 1. Atrial fib age of onset unknown 2. Diastolic CHF - due to afib and LVH, clinically better 3. ESRD 4. Anticoagulation with warfarin now therapeutic  Rec:  May go home from cardiac viewpoint.  She is now on amiodarone 200 BID and should stay on this to help with rate control for now.  She should have her INR followed in [place] where she lives. I will need to see her in 2 weeks we might consider cardioversion down the road.       Darden Palmer  MD [facility]  07/18/2013, 9:25 AM

## 2013-07-18 NOTE — Progress Notes (Addendum)
ANTICOAGULATION CONSULT NOTE - Follow Up Consult  Pharmacy Consult for Coumadin Indication: atrial fibrillation  Allergies  Allergen Reactions  . Doxycycline Nausea And Vomiting  . Lipitor [Atorvastatin] Nausea And Vomiting    Labs:  Recent Labs  07/16/13 0547 07/17/13 0830 07/18/13 0625  LABPROT 17.3* 21.0* 23.1*  INR 1.45 1.87* 2.12*    Estimated Creatinine Clearance: 21.8 ml/min (by C-G formula based on Cr of 3).  Assessment: 62 y.o. female on coumadin for afib (no heparin bridge at this time - it was d/c with plt drop 11/26 to 77 -?lab error).  HIT panel still pending  INR = 2.12 today  Amiodarone added this admission  Goal of Therapy:  INR 2-3 Monitor platelets by anticoagulation protocol: Yes   Plan:  -Coumadin 5mg  po today -Daily PT/INR  Thank you. Okey Regal, PharmD [phone]  07/18/2013  8:56 AM  When home, recommend 7.5 mg po MWF, 5 mg other days.  Thank you. Okey Regal, PharmD [phone]

## 2013-07-18 NOTE — Progress Notes (Signed)
I have seen the patient and reviewed the daily progress note by Lewie Chamber MS IV and discussed the care of the patient with them.  See below for documentation of my findings, assessment, and plans.  Subjective: She states that she feels better today and is eager to go home. Again she requests if we could change her HD center to a place where she could be supine while undergoing HD to avoid recurrent acute exacerbations of her chronic back pain. Denies CP, SOB, fever/chills.   Objective: Vital signs in last 24 hours: Filed Vitals:   07/18/13 0752 07/18/13 1017 07/18/13 1127 07/18/13 1200  BP: 119/71 104/66 123/77 129/62  Pulse: 92 118 82 83  Temp: 98.2 F (36.8 C)  97.5 F (36.4 C)   TempSrc: Oral  Oral   Resp: 18  22 15   Height:      Weight:      SpO2: 94%  96% 96%   Weight change: 14 lb 15.9 oz (6.8 kg)  Intake/Output Summary (Last 24 hours) at 07/18/13 1447 Last data filed at 07/18/13 1324  Gross per 24 hour  Intake   1040 ml  Output      0 ml  Net   1040 ml  Vitals reviewed.  General: resting in HD bed, in NAD  HEENT: Pale with mild scleral icterus Cardiac: Irregularly irregular, no rubs, murmurs or gallops appreciated  Pulm: Decreased breath sounds at the bases, no wheezes, rales, or rhonchi  Abd: soft, nontender, nondistended, BS present  Ext: warm and well perfused, 3+ pitting edema bilaterally up to her thighs, chronic skin changes. Abrasion on left hand and right wrist with no signs of infection.  Neuro: alert and oriented X3, no focal deficits, following commands, moves all 4 extremities voluntarily.   Lab Results: Reviewed and documented in Electronic Record Micro Results: Reviewed and documented in Electronic Record Studies/Results: Reviewed and documented in Electronic Record Medications: I have reviewed the patient's current medications. Scheduled Meds: . albumin human  25 g Intravenous Once  . amiodarone  200 mg Oral BID  . calcitRIOL  0.25 mcg Oral  Q M,W,F  . calcium acetate  667 mg Oral TID WC  . diltiazem  60 mg Oral Q6H  . feeding supplement (NEPRO CARB STEADY)  237 mL Oral BID BM  . metoprolol tartrate  25 mg Oral BID  . multivitamin  1 tablet Oral QHS  . pantoprazole  40 mg Oral Daily  . polyethylene glycol  17 g Oral Daily  . sodium chloride  3 mL Intravenous Q12H  . warfarin  5 mg Oral ONCE-1800  . [START ON 07/19/2013] warfarin  5 mg Oral Q T,Th,S,Su-1800  . warfarin  7.5 mg Oral Q M,W,F-1800  . warfarin   Does not apply Once  . Warfarin - Pharmacist Dosing Inpatient   Does not apply q1800  . zolpidem  10 mg Oral QHS   Continuous Infusions:  PRN Meds:.albuterol, HYDROcodone-acetaminophen Assessment/Plan: 62 year old female with past medical history of ESRD on HD (TTS) since 03/2013, HTN, HL, T2DM, COPD, diastolic CHF, OSA, and GERD who presented on 11/25 with recent fall and found to be in atrial fibrillation with RVR and hypoxia with right loculated pleural effusion.   A. Fib with RVR - New - Likely caused by fluid overload. TSH WNL. CHADSVC of 4. Echo EF 75% but 2/2 significant concentric hypertropy and beat to beat variation, dilated atria bilaterally. Remains on A. Fib despite metoprolol and cardizem drip not well tolerated  due to low BP. Has acute on chronic dCHF due to A.fib with RVR. Started on amio PO per Cardiology with improvement in rate control.  -Appreciate Cardiology recommendations.  -Continue warfarin, needs long-term anticoagulation.  -Per cards: continue Cardizem 60mg  q6h, amiodarone 200mg  BID, stable for discharge.  -Working on obtaining records from [facility] and [facility] (cath by Dr. Lynnea Ferrier).   ESRD - Pt on HD since 03/2013. Pt is oliguric (occasional leaks). She follows with ESRD on dialysis at the [facility] on TTS schedule but will likely need 4 days/week HD. No current HF center that would allow her to be supine while undergoing treatment. Pt may try back support foams or to be turned during HD  sessions to prevent acute exacerbation of her chronic back pain.  -Continue Phoslo, calcitrol, and RENA-Vit  -anemia panel most consistent with Fe deficiency   Mechanical Fall with head injury and resulting hematoma due to weakness - CT head negative for acute intracranial abnormality but evidence of subcutaneous hematoma (2.3 cm) in left occipital region. CT cervical spine without acute fracture or subluxation. XR left hand, humerus, and wrist without evidence of fracture or other focal bone lesions.  -Continue home norco Q6 PRN pain  -HH PT/OT, rolling walker  Hypertension - Pt on home atenolol, clonidine, hydralazine and lisinopril which have all been weld. BP well controlled with diltiazem, BB.   Grade II Diastolic Heart Failure - Pt with last 2D-Echo on 12/03/12 with evidence of Grade 2 diastolic dysfunction with normal EF (65-70%) and no wall motion abnormalities and mild to moderate AS. Acute on chronic dCHF during this hospitalization 2/2 A.fib with RVR.  -Discontinue home PO 80 mg lasix as she is oliguric, on HD as inpatient   Complicated Diabetes Mellitus - Stable. No record of HbA1c on file. Complicated with peripheral neuropathy and nephropathy with progression to ESRD.  -CBG q ac, SSI.   Adrenal adenoma - incidentally found, will need outpatient follow up.   Right thyroid nodule - She has hx of laryngeal mass excision and needs further outpatient evaluation of this thyroid nodule.   Code: Full  Diet: Renal  DVT Ppx: Heparin   Dispo: Disposition is deferred at this time, awaiting improvement of current medical problems.  Anticipated discharge in approximately 1-2 day(s).   The patient does have a current PCP Milana Obey, MD) and does not need an [facility] hospital follow-up appointment after discharge.  The patient does not have transportation limitations that hinder transportation to clinic appointments.  .Services Needed at time of discharge: Y = Yes, Blank = No PT: Yes   OT: Yes  RN:   Equipment: Rolling walker  Other:     LOS: 6 days   Ky Barban, MD 07/18/2013, 2:47 PM

## 2013-07-18 NOTE — Progress Notes (Signed)
   I have seen the patient and reviewed the daily progress note by Lewie Chamber MS 4 and discussed the care of the patient with them.  See my documentation of my findings, assessment, and plans.   Christen Bame, MD 07/18/2013, 8:20 AM

## 2013-07-18 NOTE — Evaluation (Signed)
Occupational Therapy Evaluation Patient Details Name: Anita [name] MRN: [id number] DOB: 04/17/1951 Today's Date: 07/18/2013 Time: 4098-1191 OT Time Calculation (min): 19 min  OT Assessment / Plan / Recommendation History of present illness Anita [name] is a 62 year old female with past medical history of ESRD on HD  (TTS) since 03/2013, HTN, HL, T2DM, COPD, diastolic CHF, OSA, and GERD who presents with weakness and recent fall.   Clinical Impression   Pt admitted with above.  She presents to OT with generalized weakness and difficulty moving sit to stand which impacts ADL performance.  She reports she is discharging home today with spouse.  He was present during eval, and was able to safely assist her with sit to stand transfers reporting that she is close to her baseline level of functioning.  Pt will benefit from continued OT to address the below listed deficits to increase her independence and safety with BADLs.  Will defer further OT to [facility] due to pt report that she is discharging home today.     OT Assessment  All further OT needs can be met in the next venue of care    Follow Up Recommendations  Home health OT;Supervision/Assistance - 24 hour    Barriers to Discharge      Equipment Recommendations  None recommended by OT    Recommendations for Other Services    Frequency       Precautions / Restrictions Precautions Precautions: Fall   Pertinent Vitals/Pain     ADL  Eating/Feeding: Independent Where Assessed - Eating/Feeding: Chair Grooming: Wash/dry hands;Wash/dry face;Teeth care;Denture care;Supervision/safety Where Assessed - Grooming: Unsupported standing Upper Body Bathing: Set up Where Assessed - Upper Body Bathing: Unsupported sitting Lower Body Bathing: Min guard Where Assessed - Lower Body Bathing: Unsupported standing Upper Body Dressing: Supervision/safety;Set up Where Assessed - Upper Body Dressing: Unsupported sitting Lower Body Dressing: Minimal  assistance Where Assessed - Lower Body Dressing: Unsupported sit to stand Toilet Transfer: Minimal assistance Toilet Transfer Method: Sit to stand;Stand pivot Toilet Transfer Equipment: Bedside commode Toileting - Clothing Manipulation and Hygiene: Min guard Where Assessed - Toileting Clothing Manipulation and Hygiene: Standing Transfers/Ambulation Related to ADLs: supervision ADL Comments: Pt's spouse is able to assist her with sit to stand and reports she is close to her baseline level of functioning.  Pt reports she plans to discharge home today.  She also reports that seats in her home are elevated to allow her to move sit to stand with no or minimal assist.     OT Diagnosis: Generalized weakness  OT Problem List: Decreased strength;Decreased activity tolerance;Decreased knowledge of use of DME or AE;Obesity OT Treatment Interventions:     OT Goals(Current goals can be found in the care plan section)    Visit Information  Last OT Received On: 07/18/13 Assistance Needed: +2 (To get out of chair.  Husband able to provide +1) History of Present Illness: Anita [name] is a 62 year old female with past medical history of ESRD on HD  (TTS) since 03/2013, HTN, HL, T2DM, COPD, diastolic CHF, OSA, and GERD who presents with weakness and recent fall.       Prior Functioning     Home Living Family/patient expects to be discharged to:: Private residence Living Arrangements: Spouse/significant other Available Help at Discharge: Family;Available PRN/intermittently Type of Home: House Home Access: Stairs to enter Entrance Stairs-Number of Steps: 1 Home Layout: One level Home Equipment: Bedside commode Additional Comments: Pt reports she has a walk in tub, but the seat  is too low for her to move from sit to stand so she will therefore stand to shower with supervision from spouse Prior Function Level of Independence: Needs assistance Gait / Transfers Assistance Needed: spouse assists out  of car  ADL's / Homemaking Assistance Needed: spouse assists with tub transfer Communication Communication: No difficulties         Vision/Perception     Cognition  Cognition Arousal/Alertness: Awake/alert Behavior During Therapy: WFL for tasks assessed/performed Overall Cognitive Status: Within Functional Limits for tasks assessed    Extremity/Trunk Assessment Upper Extremity Assessment Upper Extremity Assessment: Generalized weakness Lower Extremity Assessment Lower Extremity Assessment: Defer to PT evaluation     Mobility Bed Mobility Bed Mobility: Not assessed Transfers Transfers: Sit to Stand;Stand to Sit Sit to Stand: 3: Mod assist;With upper extremity assist;From chair/3-in-1 Stand to Sit: 5: Supervision;With upper extremity assist;To elevated surface;To bed Details for Transfer Assistance: Spouse assisted pt to move sit to stand from chair     Exercise     Balance Balance Balance Assessed: Yes Dynamic Standing Balance Dynamic Standing - Balance Support: No upper extremity supported Dynamic Standing - Level of Assistance: 5: Stand by assistance Dynamic Standing - Balance Activities: Other (comment) (simulated shower)   End of Session OT - End of Session Activity Tolerance: Patient tolerated treatment well Patient left: in bed;with call bell/phone within reach;with family/visitor present (Pt seated EOB with spouse present)  GO     Laymon Stockert M 07/18/2013, 1:50 PM

## 2013-07-18 NOTE — Progress Notes (Signed)
INTERNAL MEDICINE ATTENDING NOTE Date: 07/18/2013  Patient name: Anita [name]  Medical record number: [id number]  Date of birth: [birth year]  Assessment: 72F with ESRD on HD, DM, HTN, current smoker, and history of back surgery who presented with a mechanical fall, incidentally found to have loculated pleural effusion and new onset Afib RVR. She has been seen by cardiology and is rate controlled currently on Metoprolol, cardizem and amiodarone.   She appears well today. She does not have any obvious dysnea. Her exam is significant for irregulary irregular heart beat and minimal hematoma on the left occiput.    Plan  Fall - Hit head, subcutaneous occipital hematoma, no neuro deficitis, no acute CT brain or cervical spine abnormality.  Loculated right pleural effusion - Thought to be a non-infectious collection due to fluid overload, or due to trauma, however it has not changed with HD so far. The patient has not shown any signs of infectious process symptomatically or in lab work. I am not sure if I want to tap the effusion in an asymptomatic patient. Perhaps, we can follow this up with a CXR in 1 week and see if this trends down.   RVR - Patient rate controlled on metoprolol, cardizem and amiodarone. Cardiology on board. Anticoagulation with warfarin started - day . INR therapeutic today. From cardilogy stand point ok to go home and f/u in 2 weeks. She will need coumadin clinic follow up.    ESRD - Compliant with dialysis and tolerating it well. She complains of back pain on prolonged sitting on outpatient dialysis seats. We will discuss this with the case manager to see if we can find a dialysis center with a bed or recliners.   HTN - well controlled on current regimen.    Case reviewed with residents; other plan per resident note,   Thanks, Aletta Edouard 07/18/2013, 1:58 PM.

## 2013-07-19 NOTE — Care Management Note (Signed)
    Page 1 of 1   07/19/2013     9:25:15 AM   CARE MANAGEMENT NOTE 07/19/2013  Patient:  Anita [name], Anita [name]   Account Number:  [id number]  Date Initiated:  07/18/2013  Documentation initiated by:  Junius Creamer  Subjective/Objective Assessment:   at fib w rvr, ple effusion     Action/Plan:   lives w wife, pcp dr Katharine Look   Choice offered to / List presented to:  C-1 Patient  HH arranged  HH-2 PT  HH-3 OT      [facility] agency  [facility]   Status of service:  Completed, signed off  Discharge Disposition:  HOME W HOME HEALTH SERVICES  Per UR Regulation:  Reviewed for med. necessity/level of care/duration of stay  Comments:  07/19/13 0900 Verdis Prime RN MSN BSN CCM Received referral for home health PT and OT, PT also recommends rolling walker.  TC to pt who agrees with home therapy, reviewed agencies and pt states she has no preference for agency.  Per pt, she already has rollator that she is using to ambulate.  Referral called to [facility].  07/18/13 1450 Tawonda Legaspi RN MSN BSN CCM Received referral to assist pt to find an OP HD center that would accommodate her need to lie supine while being dialyzed as she has back pain sitting in a recliner. Discussed need with Darel Hong in HD unit as she arranges OP HD. She has already talked with IM team and they will call [facility] and inquire about their nocturnal HD accommodations.

## 2013-07-19 NOTE — Progress Notes (Signed)
  [facility] Progress Note    Subjective: no new complaints   Exam  Blood pressure 131/66, pulse 83, temperature 97.5 F (36.4 C), temperature source Oral, resp. rate 16, height 5' 6.5" (1.689 m), weight 89.4 kg (197 lb 1.5 oz), SpO2 96.00%. Gen: alert and cooperative  Resp: diminished breath sounds bibasilar  Cardio: irregularly irregular rhythm  Ext: edema 2+bilat LE's  Assessment/Plan:     1. Afib w RVR: new onset, on metoprolol and po diltiazem and po amio 2. ESRD: TTS HD 3. HTN/volume: was on 4 bp meds, all have been stopped, now on dilt/BB for afib rate control 4. Volume excess: chronic issue, fluid removal hampered by low BP's at HD; hoping that afib rate control will stabilize BP and volume can be removed with HD. If not will likely need HD 4x/week, pt is aware and I have d/w pt's primary nephrologist today as well regarding this issue. 5. Debility: significant problem 6. Dispo: ok for d/c today, i have d/w primary team   Vinson Moselle MD  pager [phone]    cell [phone]  07/18/2013, 8:22 AM   Recent Labs Lab 07/12/13 1008 07/15/13 0800  NA 134* 134*  K 3.3* 3.9  CL 89* 96  CO2 24 26  GLUCOSE 100* 94  BUN 37* 15  CREATININE 4.73* 3.00*  CALCIUM 7.8* 7.9*    Recent Labs Lab 07/12/13 1008  AST 17  ALT 23  ALKPHOS 67  BILITOT 0.3  PROT 5.4*  ALBUMIN 2.2*    Recent Labs Lab 07/12/13 1008 07/13/13 0730 07/13/13 1315 07/15/13 0800  WBC 7.3 6.8 5.7 4.9  NEUTROABS 5.1  --   --   --   HGB 12.3 10.9* 10.0* 9.9*  HCT 35.4* 31.3* 28.3* 29.1*  MCV 84.5 84.4 84.5 85.1  PLT 210 77* 160 175

## 2013-07-20 NOTE — Discharge Summary (Signed)
Name: Anita [name] MRN: [id number] DOB: [birth year] 62 y.o. PCP: Milana Obey, MD  Date of Admission: 07/12/2013  9:59 AM Date of Discharge: 07/18/2013 Attending Physician: Dr. 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   ESRD on hemodialysis   Chronic diastolic heart failure   Long-term (current) use of anticoagulants   Protein-calorie malnutrition, severe  Discharge Medications:   Medication List    STOP taking these medications       atenolol 50 MG tablet  Commonly known as:  TENORMIN     cloNIDine 0.2 MG tablet  Commonly known as:  CATAPRES     furosemide 40 MG tablet  Commonly known as:  LASIX     hydrALAZINE 25 MG tablet  Commonly known as:  APRESOLINE     lisinopril 20 MG tablet  Commonly known as:  PRINIVIL,ZESTRIL      TAKE these medications       amiodarone 200 MG tablet  Commonly known as:  PACERONE  Take 1 tablet (200 mg total) by mouth 2 (two) times daily.     calcitRIOL 0.25 MCG capsule  Commonly known as:  ROCALTROL  Take 0.25 mcg by mouth every Monday, Wednesday, and Friday.     calcium acetate 667 MG capsule  Commonly known as:  PHOSLO  Take 667 mg by mouth 3 (three) times daily with meals.     [facility] CINNAMON 500 MG capsule  Generic drug:  Cinnamon  Take 500 mg by mouth daily.     diltiazem 60 MG tablet  Commonly known as:  CARDIZEM  Take 1 tablet (60 mg total) by mouth every 6 (six) hours.     HYDROcodone-acetaminophen 7.5-325 MG per tablet  Commonly known as:  NORCO  Take 1 tablet by mouth every 6 (six) hours as needed for pain.     metoprolol tartrate 25 MG tablet  Commonly known as:  LOPRESSOR  Take 1 tablet (25 mg total) by mouth 2 (two) times daily.     multivitamin Tabs tablet  Take 1 tablet by mouth at bedtime.     omeprazole 20 MG  capsule  Commonly known as:  PRILOSEC  Take 20 mg by mouth daily as needed (acid reflux).     polyethylene glycol powder powder  Commonly known as:  GLYCOLAX/MIRALAX  Take 17 g by mouth daily.     VITAMIN B 12 PO  Take 1,000 mg by mouth daily.     warfarin 7.5 MG tablet  Commonly known as:  COUMADIN  Take 1 tablet (7.5 mg total) by mouth every Monday, Wednesday, and Friday at 6 PM.     zolpidem 10 MG tablet  Commonly known as:  AMBIEN  Take 10 mg by mouth at bedtime.        Disposition and follow-up:   Anita [name] was discharged from [facility] in Stable condition.  At the hospital follow up visit please address:  1. Assess for compliance with new medications for A.fib including coumadin.   2.  Labs / imaging needed at time of follow-up: Repeat CXR in 1 week for reevaluation of loculated right pleural effusion. She will need coumadin and INR follow up.   3.  Pending labs/ test needing follow-up: None  Follow-up Appointments:  Follow-up Information   Follow up with [facility] On 08/05/2013. (Appointment time 10:30 am)    Contact information:   [address] [place] [address] [phone]      Follow up with Milana Obey, MD On 07/27/2013. (Appointment time 3:20 pm)    Specialty:  Family Medicine   Contact information:   [address] [place] [address] [phone]       Discharge Instructions: Discharge Orders   Future Orders Complete By Expires   Diet - low sodium heart healthy  As directed    Increase activity slowly  As directed       Consultations: Treatment Team:  Lauris Poag, MD Othella Boyer, MD  Procedures Performed:  Ct Abdomen Pelvis Wo Contrast  07/12/2013   CLINICAL DATA:  Fall with head injury and altered level of consciousness.  EXAM: CT CHEST, ABDOMEN AND PELVIS WITHOUT CONTRAST  TECHNIQUE: Multidetector CT imaging of the chest, abdomen and  pelvis was performed following the standard protocol without IV contrast.  COMPARISON:  Multiple exams, including 07/12/2013 and 03/21/2009.  FINDINGS: CT CHEST FINDINGS  2.0 cm partially calcified nodule the right thyroid lobe.  Coronary artery atherosclerosis with calcification of the aortic valve and mild cardiomegaly. Diffuse subcutaneous and mediastinal edema.  Moderate and likely loculated right pleural effusion with small left pleural effusion. Passive atelectasis noted. Suspected centrilobular emphysema.  CT ABDOMEN AND PELVIS FINDINGS  Diffuse subcutaneous edema with low grade mesenteric edema, all increased from prior. Trace fluid in the left paracolic gutter. Stable low-density left adrenal mass compatible with adenoma. Mild renal atrophy. Pancreas unremarkable in non-contrast CT appearance.  Aortoiliac atherosclerotic vascular disease noted with scattered small retroperitoneal lymph nodes. Sigmoid diverticulosis. No dilated bowel. Small amount of free pelvic fluid.  IMPRESSION: 1. Diffuse subcutaneous, mediastinal, and mesenteric edema compatible with 3rd spacing of fluid. 2. Moderate right and small left pleural effusions. The right pleural effusion is probably loculated. 3. Right-sided thyroid nodule. Consider further evaluation with thyroid ultrasound. If patient is clinically hyperthyroid, consider nuclear medicine thyroid uptake and scan. 4. Atherosclerosis. 5. Stable left adrenal adenoma. 6. Small amount of ascites. Please note that today's exam was performed without IV contrast and accordingly does not have high sensitivity for solid organ laceration. 7. Sigmoid diverticulosis. 8. Centrilobular emphysema.   Electronically Signed   By: Herbie Baltimore M.D.   On: 07/12/2013 12:33   Dg Chest 2 View  07/17/2013   CLINICAL DATA:  62 year old female with right pleural effusion. Initial encounter.  EXAM: CHEST  2 VIEW  COMPARISON:  07/14/2013 and earlier.  FINDINGS: Semi upright AP and lateral views  of the chest. Continued moderate right pleural effusion with associated right basilar opacity, stable. Small left pleural effusion also is stable. Stable cardiomegaly and mediastinal contours. No pneumothorax or pulmonary edema. Stable visualized osseous structures.  IMPRESSION: Unchanged moderate right and small left pleural effusions.   Electronically Signed   By: Augusto Gamble M.D.   On: 07/17/2013 21:31   Dg Chest 2 View  07/14/2013   CLINICAL DATA:  Recent fall with head injury and altered level of consciousness. Right pleural effusion.  EXAM: CHEST  2 VIEW  COMPARISON:  CT and radiographs 07/12/2013.  FINDINGS: The loculated right pleural effusion does not appear significantly changed. Adjacent right basilar airspace disease is stable. The left lung is clear. An azygos fissure is noted. The heart size and mediastinal contours are stable with aortic atherosclerosis  IMPRESSION: No significant change in loculated right pleural effusion and adjacent right basilar airspace disease.   Electronically Signed   By: Roxy Horseman M.D.   On: 07/14/2013 08:30   Dg Wrist Complete Left  07/12/2013   CLINICAL DATA:  Status post fall  EXAM: LEFT WRIST - COMPLETE 3+ VIEW  FINDINGS: There is no evidence of fracture or dislocation. There is no evidence of arthropathy or other focal bone abnormality. Soft tissues are unremarkable. The bones are osteopenic.  IMPRESSION: Osteopenia without evidence of acute osseous abnormalities.   Electronically Signed   By: Salome Holmes M.D.   On: 07/12/2013 11:53   Ct Head Wo Contrast  07/12/2013   CLINICAL DATA:  Fall  EXAM: CT HEAD WITHOUT CONTRAST  CT CERVICAL SPINE WITHOUT CONTRAST  TECHNIQUE: Multidetector CT imaging of the head and cervical spine was performed following the standard protocol without intravenous contrast. Multiplanar CT image reconstructions of the cervical spine were also generated.  COMPARISON:  None.  FINDINGS: CT HEAD FINDINGS  No skull fracture is noted.  Paranasal sinuses and mastoid air cells are unremarkable. There is skull swelling and subcutaneous stranding in left occipital region. Subcutaneous hematoma in this region measures about 2.3 cm.  No intracranial hemorrhage, mass effect or midline shift. Atherosclerotic calcifications of carotid siphon are noted. Mild cerebral atrophy is noted. Tiny lacunar infarct is noted in right basal ganglia. No acute cortical infarction. No mass lesion is noted on this unenhanced scan.  CT CERVICAL SPINE FINDINGS  Axial images of the cervical spine shows no acute fracture or subluxation. Computer processed images shows no acute fracture or subluxation. Degenerative changes are noted C1-C2 articulation. Mild disc space flattening with anterior spurring at C5-C6 level. No prevertebral soft tissue swelling. Cervical airway is patent. There is no pneumothorax in visualized left lung apex.  IMPRESSION: 1. No acute intracranial abnormality. There is scalp swelling and subcutaneous stranding in left occipital region. Subcutaneous hematoma in left occipital region measures 2.4 cm. Mild cerebral atrophy. 2. No cervical spine acute fracture or subluxation. Mild degenerative changes.   Electronically Signed   By: Natasha Mead M.D.   On: 07/12/2013 11:22   Ct Chest Wo Contrast  07/12/2013   CLINICAL DATA:  Fall with head injury and altered level of consciousness.  EXAM: CT CHEST, ABDOMEN AND PELVIS WITHOUT CONTRAST  TECHNIQUE: Multidetector CT imaging of the chest, abdomen and pelvis was performed following the standard protocol without IV contrast.  COMPARISON:  Multiple exams, including 07/12/2013 and 03/21/2009.  FINDINGS: CT CHEST FINDINGS  2.0 cm partially calcified nodule the right thyroid lobe.  Coronary artery atherosclerosis with calcification of the aortic valve and mild cardiomegaly. Diffuse subcutaneous and mediastinal edema.  Moderate and likely loculated right pleural effusion with small left pleural effusion. Passive  atelectasis noted. Suspected centrilobular emphysema.  CT ABDOMEN AND PELVIS FINDINGS  Diffuse subcutaneous edema with low grade mesenteric edema, all increased from prior. Trace fluid in the left paracolic gutter. Stable low-density left adrenal mass compatible with adenoma. Mild renal atrophy. Pancreas unremarkable in non-contrast CT appearance.  Aortoiliac atherosclerotic vascular disease noted with scattered small retroperitoneal lymph nodes. Sigmoid diverticulosis. No dilated bowel. Small amount of free pelvic fluid.  IMPRESSION: 1. Diffuse subcutaneous, mediastinal, and mesenteric edema compatible with 3rd spacing of fluid. 2. Moderate right and small left pleural effusions. The right pleural effusion is probably loculated. 3. Right-sided thyroid nodule. Consider further evaluation with thyroid ultrasound. If patient is clinically hyperthyroid, consider nuclear medicine thyroid uptake  and scan. 4. Atherosclerosis. 5. Stable left adrenal adenoma. 6. Small amount of ascites. Please note that today's exam was performed without IV contrast and accordingly does not have high sensitivity for solid organ laceration. 7. Sigmoid diverticulosis. 8. Centrilobular emphysema.   Electronically Signed   By: Herbie Baltimore M.D.   On: 07/12/2013 12:33   Ct Cervical Spine Wo Contrast  07/12/2013   CLINICAL DATA:  Fall  EXAM: CT HEAD WITHOUT CONTRAST  CT CERVICAL SPINE WITHOUT CONTRAST  TECHNIQUE: Multidetector CT imaging of the head and cervical spine was performed following the standard protocol without intravenous contrast. Multiplanar CT image reconstructions of the cervical spine were also generated.  COMPARISON:  None.  FINDINGS: CT HEAD FINDINGS  No skull fracture is noted. Paranasal sinuses and mastoid air cells are unremarkable. There is skull swelling and subcutaneous stranding in left occipital region. Subcutaneous hematoma in this region measures about 2.3 cm.  No intracranial hemorrhage, mass effect or midline  shift. Atherosclerotic calcifications of carotid siphon are noted. Mild cerebral atrophy is noted. Tiny lacunar infarct is noted in right basal ganglia. No acute cortical infarction. No mass lesion is noted on this unenhanced scan.  CT CERVICAL SPINE FINDINGS  Axial images of the cervical spine shows no acute fracture or subluxation. Computer processed images shows no acute fracture or subluxation. Degenerative changes are noted C1-C2 articulation. Mild disc space flattening with anterior spurring at C5-C6 level. No prevertebral soft tissue swelling. Cervical airway is patent. There is no pneumothorax in visualized left lung apex.  IMPRESSION: 1. No acute intracranial abnormality. There is scalp swelling and subcutaneous stranding in left occipital region. Subcutaneous hematoma in left occipital region measures 2.4 cm. Mild cerebral atrophy. 2. No cervical spine acute fracture or subluxation. Mild degenerative changes.   Electronically Signed   By: Natasha Mead M.D.   On: 07/12/2013 11:22   Dg Pelvis Portable  07/12/2013   CLINICAL DATA:  Fall, weakness and pain.  EXAM: PORTABLE PELVIS 1-2 VIEWS  COMPARISON:  CT abdomen and pelvis 03/21/2009.  FINDINGS: There is no evidence of pelvic fracture or diastasis. No other pelvic bone lesions are seen.  IMPRESSION: Negative exam.   Electronically Signed   By: Drusilla Kanner M.D.   On: 07/12/2013 11:09   Dg Chest Portable 1 View  07/12/2013   CLINICAL DATA:  Status post fall now with weakness history of dialysis dependent renal failure, and diabetes.  EXAM: PORTABLE CHEST - 1 VIEW  COMPARISON:  Dec 28, 2012.  FINDINGS: There is new blunting of the right lateral costophrenic angle. There is no pneumothorax. The observed portions of the overlying ribs appear normal. The left lung is well expanded and clear. The cardiac silhouette is mildly enlarged though stable. The pulmonary vascularity is somewhat indistinct on the right.  IMPRESSION: There is a new right pleural  effusion. This may be secondary to the patient's underlying cardiac and renal disease or reflect post traumatic processes. As best as can be determined the right ribs are intact. Followup chest CT scanning may be useful if the patient has experienced significant thoracic trauma.   Electronically Signed   By: David  [place]   On: 07/12/2013 11:07   Dg Humerus Left  07/12/2013   CLINICAL DATA:  Status post fall  EXAM: LEFT HUMERUS - 2+ VIEW  COMPARISON:  None.  FINDINGS: There is no evidence of fracture or other focal bone lesions. Soft tissues are unremarkable.  IMPRESSION: Negative.   Electronically Signed   By:  Salome Holmes M.D.   On: 07/12/2013 11:54   Dg Hand Complete Left  07/12/2013   CLINICAL DATA:  Status post fall  EXAM: LEFT HAND - COMPLETE 3+ VIEW  COMPARISON:  None.  FINDINGS: The bones are osteopenic. Mild osteoarthritic changes identified within the proximal and distal interphalangeal joints. There is no evidence of acute fracture nor dislocation.  IMPRESSION: 1. Mild osteoarthritic changes. 2. No evidence of acute osseous abnormalities.   Electronically Signed   By: Salome Holmes M.D.   On: 07/12/2013 11:48    2D Echo:  07/14/13:  Study Conclusions  - Left ventricle: The cavity size is small and appears underfilled. There was severe concentric hypertrophy. The estimated ejection fraction was estimated at ~75%, with beat to beat variation. LV diastolic function cannot be assessed due to a-fib and rapid rate. - Aortic valve: Mildly calcified without significant stenosis. Transvalvular velocity was minimally increased. Mild regurgitation. - Left atrium: Moderately dilated (39 ml/m2). - Right atrium: Moderate to severely dilated. - Tricuspid valve: Moderate regurgitation. - Pulmonary arteries: PA peak pressure: 51mm Hg (S). - Inferior vena cava: The vessel was dilated; the respirophasic diameter changes were blunted (< 50%); findings are consistent with elevated central  venous pressure. - Pericardium, extracardiac: There was no pericardial effusion.  Admission HPI:  Madisin Hasan. Cattell is a 62 year old female with past medical history of ESRD on HD (TTS) since 03/2013, HTN, HL, T2DM, COPD, diastolic CHF, OSA, and GERD who presents with weakness and recent fall. Pt states that she was returning to her front door step and lost her grip causing her to fall backwards unto the ground resulting in hitting her head without LOC. Pt reports a knot on the left side of the back of her head without visual disturbance. She also suffered some cuts to her arms and wrists after the fall.   Pt reports she has been feeling weak ever since starting HD in August of this year and has had persistent LE edema. She had HD on Saturday but due to problems with AV fistula, the session was stopped 2 hrs earlier. She is anuric but has leaks with diuretic therapy which she is complaint with (lasix 80 mg BID). She has orthopnea and occasionally PND. Also with fatigue, constipation, dry skin, decreased appetite with unintentional weight loss of 40 lb in 6 weeks, and difficulty swallowing for the Last couple of weeks, mostly with pills. She denies dyspnea (excpet when she over exerts herself), chest pain, cough, choking, neck enlargement, palpitations, tremor, diaphoresis, or anxiety. Pt has history of thyroid mass that was removed and per patient was benign. No FH of thyroid disease or radiation to head and neck. She denies trauma to the chest.   Pt smokes about 0.5 pack a day (1 pack before starting HD) since age 56. She has history of COPD not on home oxygen and does not use any inhalers with no recent exacerbations, history of intubation, or recent steroid or antibiotic use. Pt reports she is compliant with taking her anti-hypertensives (lisinopril, clonidine, hydralazine, & atenolol). She was previously on insulin but states she was removed from it and other diabetic medications for the past 6-8 months  because she did not have diabetes any longer.     Hospital Course by problem list:  1. Dyspnea - Improved prior to her discharge, thought to be secondary to A.fib, volume overload which are discussed below.   2. A. fib (new diagnosis) with RVR - She was hospitalized after having  a fall at home due to decreased strength, but was found to be in A. Fib with RVR (HR 129) on admission. She had heart cath that she reports as normal approximately 5 years ago by Dr. Lynnea Ferrier at [facility]. Her 2D echo from April 2014 revealed LVEF 65-70%, Grade II diastolic dysfunction and moderate LVH. Repeat echo on admission revealed LVEF 75%, LA dilation, RA moderate-severe dilation, increased PA psi ( ), dilated IVC, and unable to assess diastolic function due to afib with rapid rate. CHADS2-VASc score = 4 (4% annual stroke risk). She was started initially on heparin for A. Fib and was subsequently found to have decreased platelets count which proved to be lab error after recheck of CBC multiple times. Nonetheless, HIT panel was still ordered  But was negative. Cardiology consult was obtained with recommendation to start warfarin with no heparin bridge and she was discharged with a therapeutic INR. Metoprolol was started for rate control with poor response, Cardizem IV was added for adjunct therapy and then transitioned to PO but she still had no rate response with BP not allowing for uptitration of this medicine thus amiodarone was also added per Cardiology. She was discharged on all three medications orally. Cardiology will follow up with her in 2 weeks after discharge for reassessment of her A. Fib and possible cardioversion. She will need to be followed by a Coumadin clinic for INR checks.   3. Loculated Pleural effusion - She had dyspnea at home especially with exertion. CT chest revealed moderate loculated right pleural effusion and a small left pleural effusion. Her dyspnea dramatically  improved with inpatient HD however the effusions remained when re-imaged by CXR. She had no signs or symptoms of infectious process during her hospitalization(afebrile, no leukocytosis, normal lactic acid). Differential included decompensated CHF with afib as a factor for her decreased cardiac function, pneumonia (although no cough, fever, or sputum), occupational lung disease (worked in a Orth[employer] for 30 years), and lastly an occult malignancy (50 lb weight loss over 5 months according to [employer] records although she has had failure to thrive and started dialysis in August with presumed substantial volume overload). She has no PFT on file. She is not dependent on home oxygen and was quickly weaned off wall oxygen while inpatient. She did meet criteria for diagnostic thoracentesis (bilateral effusions that are of markedly disparate sizes, effusion does not resolve with HF therapy), however given her resolve of dyspnea with HD and lack of infectious signs, this procedure was not needed. A repeat CXR after discharge and further improvement of her volume overload is recommended. Her PCP has been contact by phone and updated on this pleural effusion and the need to repeat CXR after her discharge.    4. ESRD - She has a left arm fistula and began dialysis August 2014. Has had problems in outpatient setting with regard to fistula access and chronic lower back pain limiting her ability to be in the HD chair for 4 hours. She had 3+ pitting edema in LE bilaterally that did not improve despite dialysis and SCDs while inpatient. She notes that her baseline is about 1+ pitting edema at home and wears compression stockings. She goes to [facility] in [place] for dialysis T/Th/Sa. Nephrology followed her while inpatient and had considered changing her to HD 4 days a week but ultimately decided to hold off against this for now. A long conversation was carried with the patient and her HD center (over the phone) in regards to  her  not being able to sit thru 4 hours of HD in a chair due to her chronic lumbago. After a long explanation about the importance of dialysis and the willingness of the [facility] staff to help her, she seemed to be more amenable to working with the staff and bringing an egg foam mattress and body pillows with her to dialysis. Home meds calcitriol, calcium acetate were continued. Due to oliguria, Lasix was discontinued by nephrology prior to discharge.   5. HTN - She has a history of long standing hypertension. She was hypotensive on admission and home meds (atenolol, Lisinopril, hydralazine) were held and clonidine was continued. She continued to have low BPs especially after HD and ultimately clonidine was held with need for NS boluses as needed. BP ultimately stabilized and she was discharged home with her BP meds discontinued for now.   6. Adrenal adenoma - CT abd/pelvis revealed a stable density left adrenal mass consistent with adenoma. This may be further work up in the outpatient setting.    7. Thyroid nodule - She has history of laryngeal neck mass removed (non-cancerous). CT chest revealed a 2 cm calcified R thyroid nodule. She was euthyroid (TSH and Free T4, WNL). Would benefit from further workup and FNA of nodule to rule out malignancy.   8. Fall -  She had a fall at home due to decreased strength. She hit her head and developed a subcutaneous occipital hematoma but had no Neurological deficits with no acute CT brain or cervical spine abnormality.   9. Malnutrition - She describes weakness and decreased strength since beginning dialysis August 2014. She notes more weakness LE>UE and difficulty rising from bed in the mornings and reaching with her arms. Records in epic indicate a weight loss [id number] over past 5 months. Differential included muscle atrophy due to poor nutrition, increased sedentary lifestyle, and failure to thrive vs. Polymyositis (less likely as CK WNL) vs. thyroid dysfunction  (currently euthyroid). Prealbumin was low and dietician consult recommended nepro BID and starting RENA-vit. She was also seen by PT with recs for home health PT on non-HD days. This was set up for her on discharge.   10. Dysphagia -  She reported difficulty swallowing on admission, especially pills. She denied any vomiting secondary to swallowing and no progression in the nature of difficulty. SLP eval was obtained and revealed mild aspiration risk with no follow-up recommendations noted.   11. DM2 - She states she no longer is on medication for her diabetes. ESRD 2/2 DM. All documented random glucose levels <200 while inpatient.    12. COPD - She has 40 pack year smoking history and is current smoker. Will need continued smoking cessation counseling.   13. Acute on Chronic Diastolic CHF - Grade II diastolic dysfunction with preserved EF 65-70%, via April 2014 echo. Current echo read with LVEF 75%, unable to assess diastolic function. She had continued signs and symptoms of volume overload seen with B/L pleural effusions and increased LE edema. Home Lasix continued initially but then stopped per Nephrology. Metoprolol started by Cardiology for rate control and mortality benefit.   14. GERD - Well controlled history of reflux with no complaints on admission. She was continued on a PPI.   15. Constipation - She has had significant constipation ever since initiation of hemodialysis. She takes Miralax at home daily with good response. She remained on treatment with Miralax with no complications.   Discharge Vitals:   BP 131/66  Pulse 83  Temp(Src)  97.5 F (36.4 C) (Oral)  Resp 16  Ht 5' 6.5" (1.689 m)  Wt 197 lb 1.5 oz (89.4 kg)  BMI 31.34 kg/m2  SpO2 96%  Discharge Labs:  Basic Metabolic Panel:   Recent Labs  Lab  07/12/13 1008  07/15/13 0800   NA  134*  134*   K  3.3*  3.9   CL  89*  96   CO2  24  26   GLUCOSE  100*  94   BUN  37*  15   CREATININE  4.73*  3.00*   CALCIUM  7.8*   7.9*   MG  --  1.8    Liver Function Tests:   Recent Labs  Lab  07/12/13 1008   AST  17   ALT  23   ALKPHOS  67   BILITOT  0.3   PROT  5.4*   ALBUMIN  2.2*     CBC:   Recent Labs  Lab  07/12/13 1008   07/13/13 1315  07/15/13 0800   WBC  7.3  < >  5.7  4.9   NEUTROABS  5.1  --  --  --   HGB  12.3  < >  10.0*  9.9*   HCT  35.4*  < >  28.3*  29.1*   MCV  84.5  < >  84.5  85.1   PLT  210  < >  160  175   < > = values in this interval not displayed.  Cardiac Enzymes:   Recent Labs  Lab  07/12/13 1008  07/13/13 1030   CKTOTAL  --  48   TROPONINI  <0.30  --    CBG:   Recent Labs  Lab  07/12/13 1712   GLUCAP  83     Thyroid Function Tests:   Recent Labs  Lab  07/13/13 1030   TSH  4.126   FREET4  0.88    Coagulation:   Recent Labs  Lab  07/15/13 0500  07/16/13 0547  07/17/13 0830  07/18/13 0625   LABPROT  16.1*  17.3*  21.0*  23.1*   INR  1.32  1.45  1.87*  2.12*    Anemia Panel:   Recent Labs  Lab  07/15/13 0800   VITAMINB12  1096*   FOLATE  >20.0   FERRITIN  148   TIBC  89*   IRON  41*   RETICCTPCT  1.3     Signed: Ky Barban, MD [facility] PGY-II 07/18/2013, 9:00 PM   Time Spent on Discharge: 60 minutes Services Ordered on Discharge: Home Health PT, OT Equipment Ordered on Discharge: None (she already had rollator used for ambulation).

## 2013-07-26 NOTE — Discharge Summary (Signed)
   Date: 07/26/2013    Patient name: Anita [name]  MRN: [id number]  Date of birth: [birth year]  I evaluated the patient on the day of discharge and discussed the discharge plan with my resident team. I agree with the discharge documentation and disposition.     Aletta Edouard 07/26/2013, 4:44 PM

## 2013-08-05 ENCOUNTER — Encounter: Payer: Self-pay | Admitting: Cardiology

## 2013-08-05 NOTE — Progress Notes (Unsigned)
Patient ID: KARENANN MCGRORY, female   DOB: 02-17-1951, 62 y.o.   MRN: [id number]  Sunnie, Odden  Date of visit:  08/05/2013 DOB:  [birth year]    Age:  62 yrs. Medical record number:  [id number]     Account number:  [id number] Primary Care Provider: [facility] ____________________________ CURRENT DIAGNOSES  1. Arrhythmia-Atrial Fibrillation  2. Hypertensive Heart Disease-Benign with CHF  3. Long Term Use Anticoagulant  4. Diabetes Mellitus-with Renal Manifestations IDD  5. Aortic Valve-Stenosis  6. Congestive Heart Failure Chronic Diastolic  7. Atherosclerosis Vascular Disease ____________________________ ALLERGIES  Atorvastatin, Nausea  Doxycycline, Nausea ____________________________ MEDICATIONS  1. amiodarone 200 mg tablet, BID  2. metoprolol tartrate 25 mg tablet, BID  3. warfarin 7.5 mg tablet, Take as directed  4. calcitriol 0.25 mcg capsule, 1 q mon, wed, fri  5. Cinnamon 500 mg capsule, 1 p.o. daily  6. hydrocodone 7.5 mg-acetaminophen 325 mg tablet, PRN  7. omeprazole magnesium 20 mg capsule,delayed release, PRN  8. polyethylene glycol 3350 17 gram oral powder packet, 1 p.o. daily  9. Vitamin B-12 1,000 mcg tablet, 1 p.o. daily  10. zolpidem 10 mg tablet, QHS  11. Renvela 800 mg tablet, 3 cap tid  12. Renal Caps 1 mg capsule, 1 p.o. daily  13. diltiazem ER 180 mg capsule,extended release, 1 p.o. daily ____________________________ CHIEF COMPLAINTS  Followup of Arrhythmia-Atrial Fibrillation  Followup of Hypertensive Heart Disease-Benign with CHF  Followup of Long Term Use Anticoagulant ____________________________ HISTORY OF PRESENT ILLNESS This very complicated 62 year old female was seen in the hospital when she presented with significant fluid retention and atrial fibrillation. Her atrial fibrillation rate was difficult to control and she was placed on treatment with warfarin. She eventually required a low dose amiodarone to help with rate control and her  edema improved. She had a lot of back pain eventually resolved. She had significant LVH and diastolic dysfunction as well as pleural effusions. She looks a lot better now. She continues on dialysis and has only been taking warfarin 3 days a week. She initially was seen here for protime check and her initial value was fine but today her INR is quite elevated. She denies PND, orthopnea or claudication but does have significant difficulty with edema at times. She does not currently have angina. She had an unremarkable heart catheter about 5 years ago. She has been on amiodarone and is now back in sinus rhythm. ____________________________ PAST HISTORY  Past Medical Illnesses:  DM-insulin dependent, ESRD on hemodialysis, hypertension, hyperlipidemia;  Cardiovascular Illnesses:  chronis diastolic CHF, aortic stenosis, atrial fibrillation, CAD;  Surgical Procedures:  appendectomy, cesarean section, cholecystectomy, hemmoroidectomy, laminectomy lumbar, shoulder repair, placement of AV fistula, Achilles tendon repair, elbow surgery;  NYHA Classification:  II;  Cardiology Procedures-Invasive:  cardiac cath (left) 2009;  Cardiology Procedures-Noninvasive:  echocardiogram November 2014;  Cardiac Cath Results:  normal coronary arteries;  LVEF of 75% documented via echocardiogram on 07/13/2013,   CHA2DS2-VASC Score:  5 ____________________________ CARDIO-PULMONARY TEST DATES EKG Date:  08/05/2013;   Cardiac Cath Date:  02/24/2008;  Echocardiography Date: 07/13/2013;  Chest Xray Date: 07/17/2013;   ____________________________ FAMILY HISTORY Brother -- Brother dead, Suicide Brother -- Brother dead, Myocardial infarction at less than 16 Father -- Father dead, Multiple sclerosis Mother -- Mother dead, Transplant of lung Sister -- Sister alive and well Sister -- Sister alive and well ____________________________ SOCIAL HISTORY Alcohol Use:  no alcohol use;  Smoking:  smokes 1 ppd;  Diet:  renal failure diet;  Lifestyle:  married;  Exercise:  no regular exercise;  Occupation:  homemaker;  Residence:  lives with husband;   ____________________________ REVIEW OF SYSTEMS General:  weight loss of approximately 20 lbs  Integumentary:easy bruisability Eyes: diabetic retinopathy Respiratory: denies dyspnea, cough, wheezing or hemoptysis. Cardiovascular:  please review HPI Abdominal: dyspepsia Musculoskeletal:  chronic low back pain Neurological:  denies headaches, stroke, or TIA  ____________________________ PHYSICAL EXAMINATION VITAL SIGNS  Blood Pressure:  124/70 Sitting, Left arm, regular cuff  , 114/68 Standing, Left arm and regular cuff   Pulse:  60/min. Weight:  178.00 lbs. Height:  66"BMI: 29  Constitutional:  pleasant white female, in no acute distress, chronically ill appearing Skin:  scattered ecchymosis present Head:  normocephalic, normal hair pattern, no masses or tenderness Eyes:  EOMS Intact, PERRLA, C and S clear, Funduscopic exam not done. ENT:  ears, nose and throat reveal no gross abnormalities.  Dentition good. Neck:  supple, without massess. No JVD, thyromegaly or carotid bruits. Carotid upstroke normal. Chest:  normal symmetry, clear to auscultation and percussion. Cardiac:  regular rhythm, normal S1 and S2, no S3 or S4, grade 2/6 systolic murmur at aortic area radiating to neck Abdomen:  abdomen soft,non-tender, no masses, no hepatospenomegaly, or aneurysm noted Peripheral Pulses:  the femoral,dorsalis pedis, and posterior tibial pulses are full and equal bilaterally with no bruits auscultated. Extremities & Back:  2+ edema, well healed dialysis fistula left arm Neurological:  no gross motor or sensory deficits noted, affect appropriate, oriented x3. ____________________________ IMPRESSIONS/PLAN  1. Atrial fibrillation of undetermined age of onset but currently back in sinus rhythm 2. Long-term use of anticoagulation with warfarin with significant lability in her protimes and poor  understanding of warfarin 3. Hypertensive heart disease 4. Diabetes mellitus with multiple complications 5. Chronic diastolic congestive heart failure 6. Vascular disease as noted on CT scan  Recommendations:  Discussion about warfarin. She lives a long way from here and would like to have her anticoagulation managed in [place]. We will continue to manage her here she is willing to common for protime fingersticks but if she is not willing to do that or cannot afford that then she will need to have her protimes managed in [place] by her primary physician if he is willing to do that. Her INR should be between 2 and 3.  I recommended that she continue amiodarone 200 mg twice daily and that she change her diltiazem to extended release diltiazem 180 mg daily in lieu of 4 times daily lower dose. She is to hold her warfarin today and we will recheck her protime next Tuesday and try to get her on a daily tablet. ____________________________ TODAYS ORDERS  1. Coag Clinic Visit: Coag OV 4 days  2. 12 Lead EKG: Today  3. Anticoag Clinic Visit: 3 days recheck PT/INR  4. Return Visit: 3 weeks                       ____________________________ Cardiology Physician:  Darden Palmer MD [facility]

## 2013-09-04 ENCOUNTER — Emergency Department (HOSPITAL_COMMUNITY): Payer: Medicare Other

## 2013-09-04 ENCOUNTER — Emergency Department (HOSPITAL_COMMUNITY)
Admission: EM | Admit: 2013-09-04 | Discharge: 2013-09-04 | Disposition: A | Discharge status: Home / Self Care | Payer: Medicare Other | Attending: Emergency Medicine | Admitting: Emergency Medicine

## 2013-09-04 ENCOUNTER — Encounter (HOSPITAL_COMMUNITY): Payer: Self-pay | Admitting: Emergency Medicine

## 2013-09-04 DIAGNOSIS — R609 Edema, unspecified: Secondary | ICD-10-CM | POA: Insufficient documentation

## 2013-09-04 DIAGNOSIS — W1809XA Striking against other object with subsequent fall, initial encounter: Secondary | ICD-10-CM | POA: Insufficient documentation

## 2013-09-04 DIAGNOSIS — Z992 Dependence on renal dialysis: Secondary | ICD-10-CM | POA: Insufficient documentation

## 2013-09-04 DIAGNOSIS — S0990XA Unspecified injury of head, initial encounter: Secondary | ICD-10-CM

## 2013-09-04 DIAGNOSIS — E1339 Other specified diabetes mellitus with other diabetic ophthalmic complication: Secondary | ICD-10-CM | POA: Insufficient documentation

## 2013-09-04 DIAGNOSIS — E1329 Other specified diabetes mellitus with other diabetic kidney complication: Secondary | ICD-10-CM | POA: Insufficient documentation

## 2013-09-04 DIAGNOSIS — W19XXXA Unspecified fall, initial encounter: Secondary | ICD-10-CM

## 2013-09-04 DIAGNOSIS — I4891 Unspecified atrial fibrillation: Secondary | ICD-10-CM | POA: Insufficient documentation

## 2013-09-04 DIAGNOSIS — J4489 Other specified chronic obstructive pulmonary disease: Secondary | ICD-10-CM | POA: Insufficient documentation

## 2013-09-04 DIAGNOSIS — S1093XA Contusion of unspecified part of neck, initial encounter: Secondary | ICD-10-CM

## 2013-09-04 DIAGNOSIS — I5032 Chronic diastolic (congestive) heart failure: Secondary | ICD-10-CM | POA: Insufficient documentation

## 2013-09-04 DIAGNOSIS — IMO0002 Reserved for concepts with insufficient information to code with codable children: Secondary | ICD-10-CM | POA: Insufficient documentation

## 2013-09-04 DIAGNOSIS — Y92009 Unspecified place in unspecified non-institutional (private) residence as the place of occurrence of the external cause: Secondary | ICD-10-CM | POA: Insufficient documentation

## 2013-09-04 DIAGNOSIS — R58 Hemorrhage, not elsewhere classified: Secondary | ICD-10-CM | POA: Insufficient documentation

## 2013-09-04 DIAGNOSIS — R5381 Other malaise: Secondary | ICD-10-CM | POA: Insufficient documentation

## 2013-09-04 DIAGNOSIS — Y9389 Activity, other specified: Secondary | ICD-10-CM | POA: Insufficient documentation

## 2013-09-04 DIAGNOSIS — Z9889 Other specified postprocedural states: Secondary | ICD-10-CM | POA: Insufficient documentation

## 2013-09-04 DIAGNOSIS — Z7901 Long term (current) use of anticoagulants: Secondary | ICD-10-CM | POA: Insufficient documentation

## 2013-09-04 DIAGNOSIS — Z79899 Other long term (current) drug therapy: Secondary | ICD-10-CM | POA: Insufficient documentation

## 2013-09-04 DIAGNOSIS — W010XXA Fall on same level from slipping, tripping and stumbling without subsequent striking against object, initial encounter: Secondary | ICD-10-CM | POA: Insufficient documentation

## 2013-09-04 DIAGNOSIS — S0003XA Contusion of scalp, initial encounter: Secondary | ICD-10-CM | POA: Insufficient documentation

## 2013-09-04 DIAGNOSIS — I120 Hypertensive chronic kidney disease with stage 5 chronic kidney disease or end stage renal disease: Secondary | ICD-10-CM | POA: Insufficient documentation

## 2013-09-04 DIAGNOSIS — R5383 Other fatigue: Secondary | ICD-10-CM

## 2013-09-04 DIAGNOSIS — E11319 Type 2 diabetes mellitus with unspecified diabetic retinopathy without macular edema: Secondary | ICD-10-CM | POA: Insufficient documentation

## 2013-09-04 DIAGNOSIS — S92901A Unspecified fracture of right foot, initial encounter for closed fracture: Secondary | ICD-10-CM

## 2013-09-04 DIAGNOSIS — N186 End stage renal disease: Secondary | ICD-10-CM | POA: Insufficient documentation

## 2013-09-04 DIAGNOSIS — S92109A Unspecified fracture of unspecified talus, initial encounter for closed fracture: Secondary | ICD-10-CM | POA: Insufficient documentation

## 2013-09-04 DIAGNOSIS — I2510 Atherosclerotic heart disease of native coronary artery without angina pectoris: Secondary | ICD-10-CM | POA: Insufficient documentation

## 2013-09-04 DIAGNOSIS — S0083XA Contusion of other part of head, initial encounter: Secondary | ICD-10-CM

## 2013-09-04 DIAGNOSIS — F17200 Nicotine dependence, unspecified, uncomplicated: Secondary | ICD-10-CM | POA: Insufficient documentation

## 2013-09-04 DIAGNOSIS — J449 Chronic obstructive pulmonary disease, unspecified: Secondary | ICD-10-CM | POA: Insufficient documentation

## 2013-09-04 DIAGNOSIS — E1349 Other specified diabetes mellitus with other diabetic neurological complication: Secondary | ICD-10-CM | POA: Insufficient documentation

## 2013-09-04 DIAGNOSIS — K5900 Constipation, unspecified: Secondary | ICD-10-CM

## 2013-09-04 DIAGNOSIS — E785 Hyperlipidemia, unspecified: Secondary | ICD-10-CM | POA: Insufficient documentation

## 2013-09-04 DIAGNOSIS — E1142 Type 2 diabetes mellitus with diabetic polyneuropathy: Secondary | ICD-10-CM | POA: Insufficient documentation

## 2013-09-04 DIAGNOSIS — D649 Anemia, unspecified: Secondary | ICD-10-CM | POA: Insufficient documentation

## 2013-09-04 DIAGNOSIS — G8929 Other chronic pain: Secondary | ICD-10-CM | POA: Insufficient documentation

## 2013-09-04 LAB — PROTIME-INR
INR: 1.31 (ref 0.00–1.49)
PROTHROMBIN TIME: 16 s — AB (ref 11.6–15.2)

## 2013-09-04 NOTE — ED Provider Notes (Signed)
CSN: ID:[id number]     Arrival date & time 09/04/13  28 History   First MD Initiated Contact with Patient 09/04/13 1719     Chief Complaint  Patient presents with  . Fall   (Consider location/radiation/quality/duration/timing/severity/associated sxs/prior Treatment) Patient is a 63 y.o. female presenting with fall. The history is provided by the patient and a relative.  Fall Pertinent negatives include no chest pain, no abdominal pain, no headaches and no shortness of breath.   patient status post fall last evening around 7 PM stumbled going down one step struck active head on the door frame and injured right foot. Patient contacted the physician on call for Dr. Karie Kirks and they recommended that she come in if she got worse at all. At that time patient felt as if she was doing fairly well. Patient's also having trouble with some constipation for the past several days. Has been using Dulcolax and MiraLAX without much success. No nausea vomiting. Patient has had history of constipation in the past. Patient is on Coumadin for atrial fib. Patient is a dialysis patient normally dialyzed Monday Wednesdays and Friday and then get supplemental and Saturday. Last dialyzed yesterday. Patient has a history of chronic back pain. To clarify patient is on Coumadin for atrial fibrillation.    Past Medical History  Diagnosis Date  . Neuropathy due to secondary diabetes   . Diabetic nephropathy   . Hypertension   . Anemia   . Dyslipidemia   . Laryngeal mass   . COPD (chronic obstructive pulmonary disease)   . GERD (gastroesophageal reflux disease)   . Sleep apnea     Does not use CPAP- due to weight loss  . Atrial fibrillation     Diagnosed 11/14 of undetermined age of onset    . 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 CAD (coronary artery disease) 07/13/2013    Catheterization 5 years ago by Dr. Elisabeth Cara with reportedly nonobstructive disease Calcification noted on CT scan of chest in  November of 2014   . ESRD on hemodialysis   . Chronic diastolic heart failure   . Aortic stenosis 12/23/2012  . 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 Atherosclerosis of aorta    Past Surgical History  Procedure Laterality Date  . Cholecystectomy    . Appendectomy    . Shoulder arthroscopy Right     w/ repair of rotator cuff repair  . Elbow tendon surgery Right   . Achilles tendon repair Right   . Cesarean section      X 2   . Hemorrhoid surgery      many years ago  . Av fistula placement Left 12/29/2012    Procedure: ARTERIOVENOUS (AV) FISTULA CREATION;  Surgeon: Elam Dutch, MD;  Location: [facility] OR;  Service: Vascular;  Laterality: Left;  Creation Left Brachial Cephalic Arteriovenous Fistula  . Cardiac catheterization    . Laryngectomy      mass removed- noncancerous  . Lumbar laminectomy      lower back   Family History  Problem Relation Age of Onset  . COPD Mother   . Multiple sclerosis Father   . Depression Brother     Suicide  . Heart disease Brother    History  Substance Use Topics  . Smoking status: Current Every Day Smoker -- 1.00 packs/day for 40 years    Types: Cigarettes  . Smokeless tobacco: Never Used  . Alcohol Use: No   OB History   Grav Para  Term Preterm Abortions TAB SAB Ect Mult Living                 Review of Systems  Constitutional: Positive for fatigue. Negative for fever.  HENT: Negative for congestion.   Eyes: Negative for redness.  Respiratory: Negative for shortness of breath.   Cardiovascular: Positive for leg swelling. Negative for chest pain.  Gastrointestinal: Positive for constipation. Negative for nausea, vomiting and abdominal pain.  Genitourinary: Negative for dysuria.  Musculoskeletal: Positive for back pain. Negative for neck pain.  Skin: Negative for rash.  Neurological: Negative for headaches.  Hematological: Bruises/bleeds easily.  Psychiatric/Behavioral: Negative for confusion.    Allergies  Doxycycline and  Lipitor  Home Medications   Current Outpatient Rx  Name  Route  Sig  Dispense  Refill  . ALPRAZolam (XANAX) 1 MG tablet   Oral   Take 0.5-1 mg by mouth 4 (four) times daily. Patient takes 1/2 tablet 2-3 times daily and 1 tablet at bedtime         . amiodarone (PACERONE) 200 MG tablet   Oral   Take 200 mg by mouth at bedtime.         Marland Kitchen Cinnamon ([employer] CINNAMON) 500 MG capsule   Oral   Take 500 mg by mouth daily.         . Cyanocobalamin (VITAMIN B 12 PO)   Oral   Take 1,000 mg by mouth daily.         Marland Kitchen diltiazem (TIAZAC) 180 MG 24 hr capsule   Oral   Take 180 mg by mouth at bedtime.         Marland Kitchen HYDROcodone-acetaminophen (NORCO) 7.5-325 MG per tablet   Oral   Take 1 tablet by mouth every 6 (six) hours as needed for pain.   30 tablet   0   . hydrOXYzine (ATARAX/VISTARIL) 25 MG tablet   Oral   Take 25 mg by mouth 3 (three) times daily as needed for itching.         . multivitamin (RENA-VIT) TABS tablet   Oral   Take 1 tablet by mouth at bedtime.   30 tablet   3   . omeprazole (PRILOSEC) 20 MG capsule   Oral   Take 20 mg by mouth daily as needed (acid reflux).          . polyethylene glycol (MIRALAX / GLYCOLAX) packet   Oral   Take 17 g by mouth daily.         . sevelamer carbonate (RENVELA) 800 MG tablet   Oral   Take 800 mg by mouth 3 (three) times daily with meals.         . silver sulfADIAZINE (SILVADENE) 1 % cream   Topical   Apply 1 application topically daily.         Marland Kitchen warfarin (COUMADIN) 2 MG tablet   Oral   Take 2 mg by mouth at bedtime.          BP 95/70  Pulse 86  Temp(Src) 97.4 F (36.3 C) (Oral)  Resp 16  Ht 5' 6.5" (1.689 m)  Wt 169 lb (76.658 kg)  BMI 26.87 kg/m2  SpO2 100% Physical Exam  Nursing note and vitals reviewed. Constitutional: She is oriented to person, place, and time. She appears well-developed and well-nourished.  HENT:  Head: Normocephalic.  Mouth/Throat: Oropharynx is clear and moist.  Right  occipital scalp contusion 2 cm in size. No step off.  Eyes: Conjunctivae and EOM  are normal. Pupils are equal, round, and reactive to light.  Neck: Normal range of motion. Neck supple.  Cardiovascular: Normal rate.   No murmur heard. Pulmonary/Chest: Effort normal and breath sounds normal. No respiratory distress.  Abdominal: Soft. There is no tenderness.  Musculoskeletal: Normal range of motion. She exhibits edema and tenderness.  AV fistula for dialysis left arm. Tenderness to the right ankle and right foot dorsalis pedis pulse is 2+. No obvious deformity no proximal leg tenderness. Bilateral leg edema left greater than right.  Neurological: She is alert and oriented to person, place, and time. No cranial nerve deficit. She exhibits normal muscle tone.  Skin: Skin is warm. No rash noted.  Scattered contusions    ED Course  Procedures (including critical care time) Labs Review Labs Reviewed  PROTIME-INR - Abnormal; Notable for the following:    Prothrombin Time 16.0 (*)    All other components within normal limits   Imaging Review Dg Ankle Complete Right  09/04/2013   CLINICAL DATA:  Fall.  Erythema and pain.  Nonhealing heel sore.  EXAM: RIGHT ANKLE - COMPLETE 3+ VIEW  COMPARISON:  None.  FINDINGS: There is a possible small avulsion fracture from the dorsal aspect of the talar head, only seen on the lateral view. There is no other evidence of acute fracture or dislocation. There are apparent postsurgical changes superiorly in the calcaneal tuberosity. Small calcaneal spurs are noted. There is possible overlying skin ulceration. No bone destruction is identified.  IMPRESSION: Possible small avulsion fracture from the dorsal aspect of the talar head. No radiographic evidence of osteomyelitis.   Electronically Signed   By: Camie Patience M.D.   On: 09/04/2013 18:26   Ct Head Wo Contrast  09/04/2013   CLINICAL DATA:  Fall.  Trauma to back of head.  EXAM: CT HEAD WITHOUT CONTRAST  TECHNIQUE:  Contiguous axial images were obtained from the base of the skull through the vertex without intravenous contrast.  COMPARISON:  CT head 07/12/2013.  FINDINGS: Remote lacunar infarcts of the right basal ganglia are stable. No acute cortical infarct, hemorrhage, or mass lesion is present. The ventricles are of normal size. No significant extra-axial fluid collection is present.  The paranasal sinuses and mastoid air cells are clear. The osseous skull is intact. No significant extracranial soft tissue injury is evident.  IMPRESSION: 1. No acute intracranial abnormality or significant interval change. 2. Stable remote lacunar infarcts of the right basal ganglia. 3. No significant extracranial soft tissue injury or osseous trauma.   Electronically Signed   By: Lawrence Santiago M.D.   On: 09/04/2013 18:24   Dg Foot Complete Right  09/04/2013   CLINICAL DATA:  Fall.  Erythema and pain.  Nonhealing heel sore.  EXAM: RIGHT FOOT COMPLETE - 3+ VIEW  COMPARISON:  None.  FINDINGS: The mineralization and alignment are normal. There is a possible minimal avulsion fracture from the dorsal aspect of the talar head. There is no other evidence of acute fracture or dislocation. Moderate degenerative changes are present at the first metatarsal phalangeal joint. There are apparent postsurgical changes superiorly in the calcaneal tuberosity. Small calcaneal spurs are noted.  IMPRESSION: Possible small avulsion fracture from the dorsal aspect of the talar head. No acute forefoot findings.   Electronically Signed   By: Camie Patience M.D.   On: 09/04/2013 18:24    EKG Interpretation   None       MDM   1. Foot fracture, right   2. Head injury  3. Fall   4. Constipation    Patient with a fall at home last evening injuring right foot and striking the right occipital part of her skull. Head CT negative for any skull fracture or intracranial injury. X-rays of the foot and ankle show an avulsion fracture of the talus. Will treat  with an orthopedic boot and referral to orthopedics for this. Patient also with concerns for constipation which were going on for several days recommended fleets enemas at home x2. followup primary care Dr. Next few days and followup with orthopedics. Continue dialysis as scheduled.    Mervin Kung, MD 09/04/13 [phone]

## 2013-09-04 NOTE — ED Notes (Addendum)
Pt states her right leg gave out last night at ~1900. Pt states she is on coumadin and hit her head when she fell. Dialysis center told her to come to ER today and get checked. Top of foot hurting from fall last night also. Pt also states constipation. Pt states she is also having depression. Pt denies SI today but has had thoughts recently due to illness and being a burden to others.

## 2013-09-04 NOTE — Discharge Instructions (Signed)
Use the orthopedic boot until seen by orthopedics. You have a small avulsion fracture in your right foot. It is okay to walk on it. The names of orthopedist in town have been provided. Call and make an appointment with one of them. Your INR was normal head CT was negative. As we discussed for the constipation going and using a fleets enema is okay. Return for any new or worse symptoms.

## 2013-09-05 ENCOUNTER — Telehealth: Payer: Self-pay | Admitting: Orthopedic Surgery

## 2013-09-05 NOTE — Telephone Encounter (Signed)
Patient called to request appointment following visit to [facility] Emergency Room on 09/04/13 for problem: avulsion fracture of right foot.  I called back to patient to offer appointment and voice messages are full on home answer machine (ph# [phone]); I called cell phone [phone]) and patient has no voice message set up.  Will try again.

## 2013-09-06 NOTE — Telephone Encounter (Signed)
Patient returned call; (offered today 09/06/13, unable to come due to home health nurse coming today) therefore, appointment scheduled for tomorrow morning, 09/07/13.

## 2013-09-07 ENCOUNTER — Ambulatory Visit (INDEPENDENT_AMBULATORY_CARE_PROVIDER_SITE_OTHER): Payer: Medicare Other | Admitting: Orthopedic Surgery

## 2013-09-07 ENCOUNTER — Encounter: Payer: Self-pay | Admitting: Orthopedic Surgery

## 2013-09-07 VITALS — BP 106/58 | Ht 66.5 in | Wt 169.0 lb

## 2013-09-07 DIAGNOSIS — S99919A Unspecified injury of unspecified ankle, initial encounter: Secondary | ICD-10-CM

## 2013-09-07 DIAGNOSIS — S99929A Unspecified injury of unspecified foot, initial encounter: Secondary | ICD-10-CM

## 2013-09-07 DIAGNOSIS — S99921A Unspecified injury of right foot, initial encounter: Secondary | ICD-10-CM | POA: Insufficient documentation

## 2013-09-07 DIAGNOSIS — S8990XA Unspecified injury of unspecified lower leg, initial encounter: Secondary | ICD-10-CM

## 2013-09-07 NOTE — Progress Notes (Signed)
   Subjective:    Patient ID: Anita [name], female    DOB: [birth year], 63 y.o.   MRN: [id number]  HPI Comments: The patient fell at home she was placed in a Cam Walker at the hospital but she can't wear it because of weakness in her right leg. Her pain level is 8/10  Foot Pain This is a new problem. The current episode started in the past 7 days. The problem occurs intermittently. The problem has been gradually improving. Associated symptoms include numbness. The symptoms are aggravated by standing. She has tried immobilization and rest for the symptoms. The treatment provided mild relief.      Review of Systems  Constitutional: Positive for unexpected weight change.  Eyes: Positive for visual disturbance.  Respiratory: Positive for shortness of breath.   Cardiovascular: Positive for palpitations.  Gastrointestinal: Positive for diarrhea and constipation.  Endocrine: Positive for cold intolerance.  Neurological: Positive for numbness.  Hematological: Bruises/bleeds easily.  Psychiatric/Behavioral: The patient is nervous/anxious.        Objective:   Physical Exam  Vitals reviewed. Constitutional: She is oriented to person, place, and time. She appears well-developed. No distress.  Musculoskeletal:       Right foot: She exhibits decreased range of motion, tenderness and bony tenderness. She exhibits no swelling, normal capillary refill, no crepitus and no deformity.       Feet:  Neurological: She is alert and oriented to person, place, and time. She exhibits normal muscle tone.  Skin: Skin is warm and dry. Rash noted. She is not diaphoretic. No erythema.  ULCER RIGHT LATERAL HEEL PRESSURE TYPE SUPERFICIAL  Psychiatric: She has a normal mood and affect. Her behavior is normal. Judgment and thought content normal.  Ortho Exam        Assessment & Plan:

## 2013-09-07 NOTE — Patient Instructions (Signed)
You can put as much weight on your foot as tolerated with shoe

## 2013-09-27 ENCOUNTER — Encounter: Payer: Self-pay | Admitting: Cardiology

## 2013-09-27 ENCOUNTER — Ambulatory Visit (INDEPENDENT_AMBULATORY_CARE_PROVIDER_SITE_OTHER): Payer: Medicare Other | Admitting: Cardiology

## 2013-09-27 VITALS — BP 121/75 | HR 70 | Ht 66.5 in | Wt 159.0 lb

## 2013-09-27 DIAGNOSIS — I5032 Chronic diastolic (congestive) heart failure: Secondary | ICD-10-CM

## 2013-09-27 DIAGNOSIS — I4891 Unspecified atrial fibrillation: Secondary | ICD-10-CM

## 2013-09-27 DIAGNOSIS — R0989 Other specified symptoms and signs involving the circulatory and respiratory systems: Secondary | ICD-10-CM

## 2013-09-27 NOTE — Patient Instructions (Signed)
Your physician recommends that you schedule a follow-up appointment in: 4 months  Your physician has requested that you have a carotid duplex. This test is an ultrasound of the carotid arteries in your neck. It looks at blood flow through these arteries that supply the brain with blood. Allow one hour for this exam. There are no restrictions or special instructions.  WE WILL CALL YOU WITH YOUR TEST RESULTS/INSTRUCTIONS/NEXT STEPS ONCE RECEIVED BY THE PROVIDER

## 2013-09-27 NOTE — Progress Notes (Signed)
Clinical Summary Anita [name] is a 63 y.o.female seen today as a new patient.   1. Afib - compliant with amiodarone and diltiazem - occasional palps that only few seconds, rare.  - compliant with coumadin, does have some bruising  - reports one episode of blood in stool 2 weeks ago, called PCP Dr Karie Kirks who checked CBC, we do not have those results. Followed in his coumadin clinic - 06/2013 normal liver panel and TSH on amiodarone.  2. HTN - checks bp at home 2-3 times a day, typically around 110s/60s - compliant with meds  3. Chronic diastolic heart failure - fluid removal by dialysis, makes very little urine - has had some with resolving cold, no current LE edema or orthopnea  4. CAD - non-obstructive disease by prior cath - no chest pain  5. ESRD - compliant with HD sessions    Past Medical History  Diagnosis Date  . Neuropathy due to secondary diabetes   . Diabetic nephropathy   . Hypertension   . Anemia   . Dyslipidemia   . Laryngeal mass   . COPD (chronic obstructive pulmonary disease)   . GERD (gastroesophageal reflux disease)   . Sleep apnea     Does not use CPAP- due to weight loss  . Atrial fibrillation     Diagnosed 11/14 of undetermined age of onset    . 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 CAD (coronary artery disease) 07/13/2013    Catheterization 5 years ago by Dr. Elisabeth Cara with reportedly nonobstructive disease Calcification noted on CT scan of chest in November of 2014   . ESRD on hemodialysis   . Chronic diastolic heart failure   . Aortic stenosis 12/23/2012  . 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 Atherosclerosis of aorta      Allergies  Allergen Reactions  . Doxycycline Nausea And Vomiting  . Lipitor [Atorvastatin] Nausea And Vomiting     Current Outpatient Prescriptions  Medication Sig Dispense Refill  . ALPRAZolam (XANAX) 1 MG tablet Take 0.5-1 mg by mouth 4 (four) times daily. Patient takes 1/2 tablet 2-3 times daily  and 1 tablet at bedtime      . amiodarone (PACERONE) 200 MG tablet Take 200 mg by mouth at bedtime.      Marland Kitchen Cinnamon ([employer] CINNAMON) 500 MG capsule Take 500 mg by mouth daily.      . Cyanocobalamin (VITAMIN B 12 PO) Take 1,000 mg by mouth daily.      Marland Kitchen diltiazem (TIAZAC) 180 MG 24 hr capsule Take 180 mg by mouth at bedtime.      Marland Kitchen HYDROcodone-acetaminophen (NORCO) 7.5-325 MG per tablet Take 1 tablet by mouth every 6 (six) hours as needed for pain.  30 tablet  0  . hydrOXYzine (ATARAX/VISTARIL) 25 MG tablet Take 25 mg by mouth 3 (three) times daily as needed for itching.      . multivitamin (RENA-VIT) TABS tablet Take 1 tablet by mouth at bedtime.  30 tablet  3  . omeprazole (PRILOSEC) 20 MG capsule Take 20 mg by mouth daily as needed (acid reflux).       . polyethylene glycol (MIRALAX / GLYCOLAX) packet Take 17 g by mouth daily.      . sevelamer carbonate (RENVELA) 800 MG tablet Take 800 mg by mouth 3 (three) times daily with meals.      . silver sulfADIAZINE (SILVADENE) 1 % cream Apply 1 application topically daily.      Marland Kitchen  warfarin (COUMADIN) 2 MG tablet Take 2 mg by mouth at bedtime.       No current facility-administered medications for this visit.     Past Surgical History  Procedure Laterality Date  . Cholecystectomy    . Appendectomy    . Shoulder arthroscopy Right     w/ repair of rotator cuff repair  . Elbow tendon surgery Right   . Achilles tendon repair Right   . Cesarean section      X 2   . Hemorrhoid surgery      many years ago  . Av fistula placement Left 12/29/2012    Procedure: ARTERIOVENOUS (AV) FISTULA CREATION;  Surgeon: Elam Dutch, MD;  Location: [facility] OR;  Service: Vascular;  Laterality: Left;  Creation Left Brachial Cephalic Arteriovenous Fistula  . Cardiac catheterization    . Laryngectomy      mass removed- noncancerous  . Lumbar laminectomy      lower back     Allergies  Allergen Reactions  . Doxycycline Nausea And Vomiting  . Lipitor [Atorvastatin]  Nausea And Vomiting      Family History  Problem Relation Age of Onset  . COPD Mother   . Multiple sclerosis Father   . Depression Brother     Suicide  . Heart disease Brother      Social History [name] reports that she has been smoking Cigarettes.  She has a 40 pack-year smoking history. She has never used smokeless tobacco. [name] reports that she does not drink alcohol.   Review of Systems CONSTITUTIONAL: No weight loss, fever, chills, weakness or fatigue.  HEENT: Eyes: No visual loss, blurred vision, double vision or yellow sclerae.No hearing loss, sneezing, congestion, runny nose or sore throat.  SKIN: No rash or itching.  CARDIOVASCULAR: per HPI RESPIRATORY: Cough, SOB GENITOURINARY: occasional dark stools NEUROLOGICAL: No headache, dizziness, syncope, paralysis, ataxia, numbness or tingling in the extremities. No change in bowel or bladder control.  MUSCULOSKELETAL: No muscle, back pain, joint pain or stiffness.  LYMPHATICS: No enlarged nodes. No history of splenectomy.  PSYCHIATRIC: No history of depression or anxiety.  ENDOCRINOLOGIC: No reports of sweating, cold or heat intolerance. No polyuria or polydipsia.  Marland Kitchen   Physical Examination p 70 bp 121/75 Wt 159 lbs BMI 25 Gen: resting comfortably, no acute distress HEENT: no scleral icterus, pupils equal round and reactive, no palptable cervical adenopathy,  CV: RRR, 3/6 systolic murmur RUSB, no JVD, bilateral carotid bruits Resp: Clear to auscultation bilaterally GI: abdomen is soft, non-tender, non-distended, normal bowel sounds, no hepatosplenomegaly MSK: extremities are warm, trace bilateral edema  Skin: warm, no rash Neuro:  no focal deficits Psych: appropriate affect   Diagnostic Studies 06/2013 Echo LVEF 75%, mild AI, mod LAE, mod TR, PASP 51    Assessment and Plan  1. Afib - no current symptoms - normal TSH and LFTs 06/2013 on amio - isolated episode of blood in stool 2 weeks ago, she  contacted her PCP. We will request the labs that were drawn, continue coumadin for now.  2. HTN - at goal, continue current meds  3. Chronic diastolic heart failure - appears euvolemic today - continue bp control  4. CAD - non-obstructive from prior cath, no current symptoms - continue risk factor modification        Arnoldo Lenis, M.D., F.A.C.C.

## 2013-10-04 ENCOUNTER — Telehealth: Payer: Self-pay | Admitting: Physician Assistant

## 2013-10-04 NOTE — Telephone Encounter (Signed)
Patient called answering service with question this morning. She got a call that her coumadin level was 9. I asked her who follows her Coumadin level and she said Dr. Karie Kirks. She said she called his office and was instructed to go to the ER for recheck due to office being closed, and called our office to find out if there was a different plan we could offer. I agree with plan to go to ER because we need to confirm that 9 is a real value and if so, to evaluate her for any evidence of bleeding and adjust her Coumadin. She verbalized understanding and gratitude. Marieann Zipp PA-C

## 2013-10-06 ENCOUNTER — Ambulatory Visit: Payer: Medicare Other | Admitting: Orthopedic Surgery

## 2013-10-11 ENCOUNTER — Ambulatory Visit (HOSPITAL_COMMUNITY): Payer: Medicare Other

## 2013-10-13 ENCOUNTER — Ambulatory Visit (HOSPITAL_COMMUNITY): Payer: Medicare Other

## 2013-10-20 ENCOUNTER — Ambulatory Visit (HOSPITAL_COMMUNITY)
Admission: RE | Admit: 2013-10-20 | Discharge: 2013-10-20 | Disposition: A | Discharge status: Home / Self Care | Payer: Medicare Other | Source: Ambulatory Visit | Attending: Cardiology | Admitting: Cardiology

## 2013-10-20 DIAGNOSIS — I7090 Unspecified atherosclerosis: Secondary | ICD-10-CM | POA: Insufficient documentation

## 2013-10-20 DIAGNOSIS — R0989 Other specified symptoms and signs involving the circulatory and respiratory systems: Secondary | ICD-10-CM | POA: Insufficient documentation

## 2014-01-26 ENCOUNTER — Ambulatory Visit (INDEPENDENT_AMBULATORY_CARE_PROVIDER_SITE_OTHER): Payer: Medicare Other | Admitting: Cardiology

## 2014-01-26 ENCOUNTER — Encounter: Payer: Self-pay | Admitting: Cardiology

## 2014-01-26 VITALS — BP 96/72 | HR 70 | Ht 66.0 in | Wt 162.0 lb

## 2014-01-26 DIAGNOSIS — I4891 Unspecified atrial fibrillation: Secondary | ICD-10-CM

## 2014-01-26 DIAGNOSIS — I5032 Chronic diastolic (congestive) heart failure: Secondary | ICD-10-CM

## 2014-01-26 DIAGNOSIS — I10 Essential (primary) hypertension: Secondary | ICD-10-CM

## 2014-01-26 NOTE — Progress Notes (Signed)
Clinical Summary Anita [name] is a 63 y.o.female seen today for follow up of the following medical problems.   1. Afib  - compliant with amiodarone and diltiazem  - occasional palps that last only few seconds, rare.  - compliant with coumadin, does have some bruising at times - reports one episode of blood in stool 2 weeks ago, called PCP Dr Karie Kirks who checked CBC, we do not have those results. Followed in his coumadin clinic  - 06/2013 normal liver panel and TSH on amiodarone.   2. HTN  - checks bp at home 2-3 times a day, typically around 110s/60s  - compliant with meds   3. Chronic diastolic heart failure  - fluid removal by dialysis, makes very little urine  - has had some SOB with resolving cold, no current LE edema or orthopnea   4. Non-obstructive CAD - non-obstructive disease by prior cath  - no chest pain. Does have some stable SOB, DOE    5. ESRD  - compliant with HD sessions  Past Medical History  Diagnosis Date  . Neuropathy due to secondary diabetes   . Diabetic nephropathy   . Hypertension   . Anemia   . Dyslipidemia   . Laryngeal mass   . COPD (chronic obstructive pulmonary disease)   . GERD (gastroesophageal reflux disease)   . Sleep apnea     Does not use CPAP- due to weight loss  . Atrial fibrillation     Diagnosed 11/14 of undetermined age of onset    . 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 CAD (coronary artery disease) 07/13/2013    Catheterization 5 years ago by Dr. Elisabeth Cara with reportedly nonobstructive disease Calcification noted on CT scan of chest in November of 2014   . ESRD on hemodialysis   . Chronic diastolic heart failure   . Aortic stenosis 12/23/2012  . 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 Atherosclerosis of aorta      Allergies  Allergen Reactions  . Doxycycline Nausea And Vomiting  . Lipitor [Atorvastatin] Nausea And Vomiting     Current Outpatient Prescriptions  Medication Sig Dispense Refill  . ACCU-CHEK  AVIVA PLUS test strip       . ALPRAZolam (XANAX) 1 MG tablet Take 0.5-1 mg by mouth 4 (four) times daily. Patient takes 1/2 tablet 2-3 times daily and 1 tablet at bedtime      . amiodarone (PACERONE) 200 MG tablet Take 200 mg by mouth at bedtime.      Marland Kitchen Cinnamon ([employer] CINNAMON) 500 MG capsule Take 500 mg by mouth daily.      . Cyanocobalamin (VITAMIN B 12 PO) Take 1,000 mg by mouth daily.      Marland Kitchen diltiazem (TIAZAC) 180 MG 24 hr capsule Take 180 mg by mouth at bedtime.      Marland Kitchen HYDROcodone-acetaminophen (NORCO) 7.5-325 MG per tablet Take 1 tablet by mouth every 6 (six) hours as needed for pain.  30 tablet  0  . hydrOXYzine (ATARAX/VISTARIL) 25 MG tablet Take 25 mg by mouth 3 (three) times daily as needed for itching.      . multivitamin (RENA-VIT) TABS tablet Take 1 tablet by mouth at bedtime.  30 tablet  3  . omeprazole (PRILOSEC) 20 MG capsule Take 20 mg by mouth daily as needed (acid reflux).       . polyethylene glycol (MIRALAX / GLYCOLAX) packet Take 17 g by mouth daily.      Marland Kitchen  promethazine (PHENERGAN) 25 MG tablet Take 25 mg by mouth every 6 (six) hours as needed.       . sevelamer carbonate (RENVELA) 800 MG tablet Take 800 mg by mouth 3 (three) times daily with meals.      . silver sulfADIAZINE (SILVADENE) 1 % cream Apply 1 application topically daily.      . traZODone (DESYREL) 50 MG tablet Take 50 mg by mouth at bedtime as needed.       . warfarin (COUMADIN) 2 MG tablet Take 2 mg by mouth at bedtime.      Marland Kitchen zolpidem (AMBIEN) 10 MG tablet        No current facility-administered medications for this visit.     Past Surgical History  Procedure Laterality Date  . Cholecystectomy    . Appendectomy    . Shoulder arthroscopy Right     w/ repair of rotator cuff repair  . Elbow tendon surgery Right   . Achilles tendon repair Right   . Cesarean section      X 2   . Hemorrhoid surgery      many years ago  . Av fistula placement Left 12/29/2012    Procedure: ARTERIOVENOUS (AV) FISTULA  CREATION;  Surgeon: Elam Dutch, MD;  Location: [facility] OR;  Service: Vascular;  Laterality: Left;  Creation Left Brachial Cephalic Arteriovenous Fistula  . Cardiac catheterization    . Laryngectomy      mass removed- noncancerous  . Lumbar laminectomy      lower back     Allergies  Allergen Reactions  . Doxycycline Nausea And Vomiting  . Lipitor [Atorvastatin] Nausea And Vomiting      Family History  Problem Relation Age of Onset  . COPD Mother   . Multiple sclerosis Father   . Depression Brother     Suicide  . Heart disease Brother      Social History [name] reports that she has been smoking Cigarettes.  She has a 30 pack-year smoking history. She has never used smokeless tobacco. [name] reports that she does not drink alcohol.   Review of Systems CONSTITUTIONAL: No weight loss, fever, chills, weakness or fatigue.  HEENT: Eyes: No visual loss, blurred vision, double vision or yellow sclerae.No hearing loss, sneezing, congestion, runny nose or sore throat.  SKIN: No rash or itching.  CARDIOVASCULAR: per HPI RESPIRATORY: + SOB at times GASTROINTESTINAL: No anorexia, nausea, vomiting or diarrhea. No abdominal pain or blood.  GENITOURINARY: No burning on urination, no polyuria NEUROLOGICAL: No headache, dizziness, syncope, paralysis, ataxia, numbness or tingling in the extremities. No change in bowel or bladder control.  MUSCULOSKELETAL: No muscle, back pain, joint pain or stiffness.  LYMPHATICS: No enlarged nodes. No history of splenectomy.  PSYCHIATRIC: No history of depression or anxiety.  ENDOCRINOLOGIC: No reports of sweating, cold or heat intolerance. No polyuria or polydipsia.  Marland Kitchen   Physical Examination p 70 bp 96/72 Wt 162 lbs BMI 26 Gen: resting comfortably, no acute distress HEENT: no scleral icterus, pupils equal round and reactive, no palptable cervical adenopathy,  CV: RRR, no m/r/g, no jVD Resp: Clear to auscultation bilaterally GI: abdomen is  soft, non-tender, non-distended, normal bowel sounds, no hepatosplenomegaly MSK: extremities are warm, no edema.  Skin: warm, no rash Neuro:  no focal deficits Psych: appropriate affect   Diagnostic Studies 06/2013 Echo  LVEF 75%, mild AI, mod LAE, mod TR, PASP 51  02/2008 Cath Normal coronaries, normal LV function  09/2013 IMPRESSION: Mild atheromatous calcifications  are seen in the right carotid bulb and proximal right internal carotid artery consistent with less than 50% diameter stenosis based on ultrasound and Doppler criteria.  Moderate atherosclerotic calcifications are noted in the middle and distal portion of the left common carotid artery. Mild atherosclerotic calcifications are noted in the left carotid bulb and proximal left internal carotid artery consistent with less than 50% diameter stenosis based on ultrasound and Doppler criteria.   Assessment and Plan  1. Afib  - no current symptoms  - normal TSH and LFTs 06/2013 on amio  - isolated episode of blood in stool 2 weeks ago, she contacted her PCP. We will request the labs that were drawn, continue coumadin for now.   2. HTN  - at goal, continue current meds   3. Chronic diastolic heart failure  - appears euvolemic today  - continue bp control   4. Non-obstructive CAD  - non-obstructive from prior cath, no current symptoms  - continue risk factor modification - from cardiac standpoint no contraindication for consideration for renal transplant. She had a cath  in 2009 with normal coronaries and has not developed any new symptoms since that time. Clinically there is no indication for repeat ischemic evaluation at this time. If required per transplant evaluation protocol please let our office know and we can arrange.     F/u 6 months  Arnoldo Lenis, M.D., F.A.C.C.

## 2014-01-26 NOTE — Patient Instructions (Signed)
Your physician wants you to follow-up in: 6 months with Dr.Branch You will receive a reminder letter in the mail two months in advance. If you don't receive a letter, please call our office to schedule the follow-up appointment.   Your physician recommends that you continue on your current medications as directed. Please refer to the Current Medication list given to you today.    Thank you for choosing [facility] !        

## 2014-02-01 ENCOUNTER — Telehealth: Payer: Self-pay | Admitting: *Deleted

## 2014-02-01 NOTE — Telephone Encounter (Signed)
Informed patient of stress test and forwarded OV to [facility] , pt has seen Dr. Florene Glen and Dr Justin Mend

## 2014-02-13 ENCOUNTER — Telehealth: Payer: Self-pay | Admitting: *Deleted

## 2014-02-13 NOTE — Telephone Encounter (Signed)
Routed to [place] Dr Florene Glen. 01/26/2014 note Dr Harl Bowie

## 2014-02-13 NOTE — Telephone Encounter (Signed)
Message copied by Desma Mcgregor on Mon Feb 13, 2014  7:19 AM ------      Message from: Carlyle Dolly F      Created: Sun Feb 12, 2014 11:22 AM       Can we send my 01/26/14 note to [facility] Dr Janae Bridgeman MD ------

## 2014-02-27 ENCOUNTER — Encounter: Payer: Self-pay | Admitting: *Deleted

## 2014-02-27 DIAGNOSIS — I4891 Unspecified atrial fibrillation: Secondary | ICD-10-CM

## 2014-02-27 DIAGNOSIS — R609 Edema, unspecified: Secondary | ICD-10-CM

## 2014-02-27 DIAGNOSIS — E211 Secondary hyperparathyroidism, not elsewhere classified: Secondary | ICD-10-CM

## 2014-02-27 DIAGNOSIS — I480 Paroxysmal atrial fibrillation: Secondary | ICD-10-CM | POA: Insufficient documentation

## 2014-02-27 DIAGNOSIS — E669 Obesity, unspecified: Secondary | ICD-10-CM

## 2014-03-02 ENCOUNTER — Other Ambulatory Visit (HOSPITAL_COMMUNITY)
Admission: RE | Admit: 2014-03-02 | Discharge: 2014-03-02 | Disposition: A | Discharge status: Home / Self Care | Payer: Medicare Other | Source: Ambulatory Visit | Attending: Obstetrics and Gynecology | Admitting: Obstetrics and Gynecology

## 2014-03-02 ENCOUNTER — Encounter: Payer: Self-pay | Admitting: Obstetrics and Gynecology

## 2014-03-02 ENCOUNTER — Ambulatory Visit (INDEPENDENT_AMBULATORY_CARE_PROVIDER_SITE_OTHER): Payer: Medicare Other | Admitting: Obstetrics and Gynecology

## 2014-03-02 VITALS — BP 124/76 | Ht 66.0 in | Wt 162.0 lb

## 2014-03-02 DIAGNOSIS — Z992 Dependence on renal dialysis: Secondary | ICD-10-CM

## 2014-03-02 DIAGNOSIS — N186 End stage renal disease: Secondary | ICD-10-CM

## 2014-03-02 DIAGNOSIS — Z124 Encounter for screening for malignant neoplasm of cervix: Secondary | ICD-10-CM | POA: Insufficient documentation

## 2014-03-02 DIAGNOSIS — Z1151 Encounter for screening for human papillomavirus (HPV): Secondary | ICD-10-CM | POA: Insufficient documentation

## 2014-03-02 NOTE — Progress Notes (Signed)
This chart was scribed by Ludger Nutting, Medical Scribe, for Dr. Mallory Shirk on 03/02/14 at 10:08 AM. This chart was reviewed by Dr. Mallory Shirk for accuracy.   Anita Clinic Visit  Patient name: Anita [name] MRN [id number]  Date of birth: 1951-02-24  CC & HPI:  KHIARA [name] is a 63 y.o. female presenting today for intermittent episodes of unchanged bladder incontinence that has been ongoing for the past several weeks. Patient was referred here by Dr. Karie Kirks to be examined before seeing her urologist. Patient complains of lower abdominal pain and a recent episode of diverticulitis. Pt denies any pelvic pain or discomfort.  Patient has a history of end stage renal disease and is on the wait list for a kidney transplant. Patient states she receives hemodialysis but also produces urine.   ROS:  +bladder incontinence  +lower abdominal pain   Pertinent History Reviewed:   Reviewed: Significant for End stage renal disease  Medical         Past Medical History  Diagnosis Date  . Neuropathy due to secondary diabetes   . Diabetic nephropathy   . Hypertension   . Anemia   . Dyslipidemia   . Laryngeal mass   . COPD (chronic obstructive pulmonary disease)   . GERD (gastroesophageal reflux disease)   . Sleep apnea     Does not use CPAP- due to weight loss  . Atrial fibrillation     Diagnosed 11/14 of undetermined age of onset    . 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 CAD (coronary artery disease) 07/13/2013    Catheterization 5 years ago by Dr. Elisabeth Cara with reportedly nonobstructive disease Calcification noted on CT scan of chest in November of 2014   . ESRD on hemodialysis   . Chronic diastolic heart failure   . Aortic stenosis 12/23/2012  . 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 Atherosclerosis of aorta                               Surgical Hx:    Past Surgical History  Procedure Laterality Date  . Cholecystectomy    . Appendectomy    . Shoulder arthroscopy  Right     w/ repair of rotator cuff repair  . Elbow tendon surgery Right   . Achilles tendon repair Right   . Cesarean section      X 2   . Hemorrhoid surgery      many years ago  . Av fistula placement Left 12/29/2012    Procedure: ARTERIOVENOUS (AV) FISTULA CREATION;  Surgeon: Elam Dutch, MD;  Location: [facility] OR;  Service: Vascular;  Laterality: Left;  Creation Left Brachial Cephalic Arteriovenous Fistula  . Cardiac catheterization    . Laryngectomy      mass removed- noncancerous  . Lumbar laminectomy      lower back   Medications: Reviewed & Updated - see associated section                      Current outpatient prescriptions:ACCU-CHEK AVIVA PLUS test strip, , Disp: , Rfl: ;  ALPRAZolam (XANAX) 1 MG tablet, Take 0.5-1 mg by mouth 4 (four) times daily. Patient takes 1/2 tablet 2-3 times daily and 1 tablet at bedtime, Disp: , Rfl: ;  amiodarone (PACERONE) 200 MG tablet, Take 200 mg by mouth at bedtime., Disp: , Rfl: ;  ciprofloxacin (CIPRO) 500  MG tablet, Take 500 mg by mouth daily with breakfast., Disp: , Rfl:  Cyanocobalamin (VITAMIN B 12 PO), Take 1,000 mg by mouth daily., Disp: , Rfl: ;  diltiazem (TIAZAC) 180 MG 24 hr capsule, Take 180 mg by mouth at bedtime., Disp: , Rfl: ;  ELIQUIS 5 MG TABS tablet, Take 5 mg by mouth 2 (two) times daily. , Disp: , Rfl: ;  HYDROcodone-acetaminophen (NORCO) 7.5-325 MG per tablet, Take 1 tablet by mouth every 6 (six) hours as needed for pain., Disp: 30 tablet, Rfl: 0 hydrOXYzine (ATARAX/VISTARIL) 25 MG tablet, Take 25 mg by mouth 3 (three) times daily as needed for itching., Disp: , Rfl: ;  lisinopril (PRINIVIL,ZESTRIL) 20 MG tablet, Take 20 mg by mouth daily. Does NOT take on Monday, Wed, Friday, Disp: , Rfl: ;  meclizine (ANTIVERT) 25 MG tablet, Take 25 mg by mouth 4 (four) times daily as needed for dizziness., Disp: , Rfl:  metroNIDAZOLE (FLAGYL) 500 MG tablet, Take 500 mg by mouth 3 (three) times daily., Disp: , Rfl: ;  multivitamin (RENA-VIT) TABS  tablet, Take 1 tablet by mouth at bedtime., Disp: 30 tablet, Rfl: 3;  omeprazole (PRILOSEC) 20 MG capsule, Take 20 mg by mouth daily as needed (acid reflux). , Disp: , Rfl: ;  polyethylene glycol (MIRALAX / GLYCOLAX) packet, Take 17 g by mouth daily., Disp: , Rfl:  promethazine (PHENERGAN) 25 MG tablet, Take 25 mg by mouth every 6 (six) hours as needed. , Disp: , Rfl: ;  sevelamer carbonate (RENVELA) 800 MG tablet, Take 800 mg by mouth 3 (three) times daily with meals., Disp: , Rfl: ;  traZODone (DESYREL) 50 MG tablet, Take 50 mg by mouth at bedtime as needed. , Disp: , Rfl:    Social History: Reviewed -  reports that she has been smoking Cigarettes and E-cigarettes.  She has a 10 pack-year smoking history. She has never used smokeless tobacco.  Objective Findings:  Vitals: Blood pressure 124/76, height 5\' 6"  (1.676 m), weight 162 lb (73.483 kg).  Physical Examination: General appearance - alert, well appearing, and in no distress and oriented to person, place, and time Mental status - alert, oriented to person, place, and time, normal mood, behavior, speech, dress, motor activity, and thought processes Pelvic -  VULVA: normal appearing vulva with no masses, tenderness or lesions,  URINARY: normal urethra and bladder VAGINA: normal appearing vagina with normal color and discharge, no lesions, atrophic vaginal tissues, moderate pelvic support  CERVIX: normal appearing cervix without discharge or lesions,  UTERUS: uterus is normal size, shape, consistency and nontender,  ADNEXA: normal adnexa in size, nontender and no masses,  RECTAL: normal rectal, no masses   Assessment & Plan:   A:  1. No evidence of cystocele or structural defects. Could consider vaginal estrogen to try and improve tissue and bladder function.   P:  1. Call Karie Kirks to discuss 2. Follow up PRN

## 2014-03-03 LAB — CYTOLOGY - PAP

## 2014-03-08 ENCOUNTER — Encounter: Payer: Self-pay | Admitting: Obstetrics and Gynecology

## 2014-06-06 ENCOUNTER — Other Ambulatory Visit (HOSPITAL_COMMUNITY): Payer: Self-pay | Admitting: Internal Medicine

## 2014-06-06 DIAGNOSIS — R599 Enlarged lymph nodes, unspecified: Secondary | ICD-10-CM

## 2014-06-08 ENCOUNTER — Ambulatory Visit (HOSPITAL_COMMUNITY)
Admission: RE | Admit: 2014-06-08 | Discharge: 2014-06-08 | Disposition: A | Discharge status: Home / Self Care | Payer: Medicare Other | Source: Ambulatory Visit | Attending: Internal Medicine | Admitting: Internal Medicine

## 2014-06-08 DIAGNOSIS — E049 Nontoxic goiter, unspecified: Secondary | ICD-10-CM | POA: Insufficient documentation

## 2014-06-08 DIAGNOSIS — R599 Enlarged lymph nodes, unspecified: Secondary | ICD-10-CM

## 2014-06-08 DIAGNOSIS — R221 Localized swelling, mass and lump, neck: Secondary | ICD-10-CM | POA: Diagnosis present

## 2014-06-12 ENCOUNTER — Other Ambulatory Visit (HOSPITAL_COMMUNITY): Payer: Self-pay | Admitting: Family Medicine

## 2014-06-12 DIAGNOSIS — Z1231 Encounter for screening mammogram for malignant neoplasm of breast: Secondary | ICD-10-CM

## 2014-06-22 ENCOUNTER — Ambulatory Visit (HOSPITAL_COMMUNITY)
Admission: RE | Admit: 2014-06-22 | Discharge: 2014-06-22 | Disposition: A | Discharge status: Home / Self Care | Payer: Medicare Other | Source: Ambulatory Visit | Attending: Family Medicine | Admitting: Family Medicine

## 2014-06-22 DIAGNOSIS — Z1231 Encounter for screening mammogram for malignant neoplasm of breast: Secondary | ICD-10-CM | POA: Insufficient documentation

## 2014-07-05 ENCOUNTER — Other Ambulatory Visit: Payer: Self-pay | Admitting: Family Medicine

## 2014-07-05 DIAGNOSIS — E041 Nontoxic single thyroid nodule: Secondary | ICD-10-CM

## 2014-07-07 ENCOUNTER — Other Ambulatory Visit: Payer: Self-pay | Admitting: Family Medicine

## 2014-07-07 DIAGNOSIS — E041 Nontoxic single thyroid nodule: Secondary | ICD-10-CM

## 2014-07-20 ENCOUNTER — Ambulatory Visit
Admission: RE | Admit: 2014-07-20 | Discharge: 2014-07-20 | Disposition: A | Discharge status: Home / Self Care | Payer: Medicare Other | Source: Ambulatory Visit | Attending: Family Medicine | Admitting: Family Medicine

## 2014-07-20 ENCOUNTER — Other Ambulatory Visit: Payer: Medicare Other

## 2014-07-20 ENCOUNTER — Other Ambulatory Visit (HOSPITAL_COMMUNITY)
Admission: RE | Admit: 2014-07-20 | Discharge: 2014-07-20 | Disposition: A | Discharge status: Home / Self Care | Payer: Medicare Other | Source: Ambulatory Visit | Attending: Interventional Radiology | Admitting: Interventional Radiology

## 2014-07-20 DIAGNOSIS — E041 Nontoxic single thyroid nodule: Secondary | ICD-10-CM

## 2014-08-24 ENCOUNTER — Ambulatory Visit (INDEPENDENT_AMBULATORY_CARE_PROVIDER_SITE_OTHER): Payer: Self-pay | Admitting: Surgery

## 2014-08-29 ENCOUNTER — Other Ambulatory Visit: Payer: Self-pay | Admitting: Surgery

## 2014-09-01 LAB — CALCITONIN

## 2014-09-05 ENCOUNTER — Ambulatory Visit (INDEPENDENT_AMBULATORY_CARE_PROVIDER_SITE_OTHER): Payer: Medicare Other | Admitting: Cardiology

## 2014-09-05 ENCOUNTER — Encounter: Payer: Self-pay | Admitting: Cardiology

## 2014-09-05 VITALS — BP 177/70 | HR 69 | Ht 66.5 in | Wt 152.0 lb

## 2014-09-05 DIAGNOSIS — I10 Essential (primary) hypertension: Secondary | ICD-10-CM

## 2014-09-05 DIAGNOSIS — I4891 Unspecified atrial fibrillation: Secondary | ICD-10-CM

## 2014-09-05 DIAGNOSIS — Z01810 Encounter for preprocedural cardiovascular examination: Secondary | ICD-10-CM

## 2014-09-05 DIAGNOSIS — I5032 Chronic diastolic (congestive) heart failure: Secondary | ICD-10-CM

## 2014-09-05 MED ORDER — HYDROXYZINE HCL 50 MG PO TABS: 50.0000 mg | ORAL_TABLET | Freq: Three times a day (TID) | ORAL | Status: DC | PRN

## 2014-09-05 MED ORDER — DILTIAZEM HCL ER BEADS 180 MG PO CP24
180.0000 mg | ORAL_CAPSULE | Freq: Every day | ORAL | Status: DC
Start: 2014-09-05 — End: 2015-03-13

## 2014-09-05 MED ORDER — HYDRALAZINE HCL 50 MG PO TABS: 50.0000 mg | ORAL_TABLET | Freq: Three times a day (TID) | ORAL | Status: DC

## 2014-09-05 NOTE — Progress Notes (Signed)
Clinical Summary Anita [name] is a 64 y.o.female seen today for follow up of the following medical problems.   1. Afib  - compliant with amiodarone and diltiazem  - denies any recent palpitations.  - on coumadin, non bleeding issues   2. HTN  - checks 160s-180s - compliant with meds  - reports pcp started metoprolol recently, states it caused dry mouth and she stopped taking  3. Chronic diastolic heart failure  - fluid removal by dialysis, makes very little urine  - denies any SOB, DOE, LE edema, or orthopnea  4. Non-obstructive CAD - non-obstructive disease by prior cath  - no chest pain. Does have some stable SOB, DOE   5. ESRD  - compliant with HD sessions  6. Preop - being considered for thyroid surgery   Past Medical History  Diagnosis Date  . Neuropathy due to secondary diabetes   . Diabetic nephropathy   . Hypertension   . Anemia   . Dyslipidemia   . Laryngeal mass   . COPD (chronic obstructive pulmonary disease)   . GERD (gastroesophageal reflux disease)   . Sleep apnea     Does not use CPAP- due to weight loss  . Atrial fibrillation     Diagnosed 11/14 of undetermined age of onset    . 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 CAD (coronary artery disease) 07/13/2013    Catheterization 5 years ago by Dr. Elisabeth Cara with reportedly nonobstructive disease Calcification noted on CT scan of chest in November of 2014   . ESRD on hemodialysis   . Chronic diastolic heart failure   . Aortic stenosis 12/23/2012  . 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 Atherosclerosis of aorta      Allergies  Allergen Reactions  . Doxycycline Nausea And Vomiting  . Lipitor [Atorvastatin] Nausea And Vomiting     Current Outpatient Prescriptions  Medication Sig Dispense Refill  . ACCU-CHEK AVIVA PLUS test strip     . ALPRAZolam (XANAX) 1 MG tablet Take 0.5-1 mg by mouth 4 (four) times daily. Patient takes 1/2 tablet 2-3 times daily and 1 tablet at bedtime      . amiodarone (PACERONE) 200 MG tablet Take 200 mg by mouth at bedtime.    . ciprofloxacin (CIPRO) 500 MG tablet Take 500 mg by mouth daily with breakfast.    . Cyanocobalamin (VITAMIN B 12 PO) Take 1,000 mg by mouth daily.    Marland Kitchen diltiazem (TIAZAC) 180 MG 24 hr capsule Take 180 mg by mouth at bedtime.    Marland Kitchen ELIQUIS 5 MG TABS tablet Take 5 mg by mouth 2 (two) times daily.     Marland Kitchen HYDROcodone-acetaminophen (NORCO) 7.5-325 MG per tablet Take 1 tablet by mouth every 6 (six) hours as needed for pain. 30 tablet 0  . hydrOXYzine (ATARAX/VISTARIL) 25 MG tablet Take 25 mg by mouth 3 (three) times daily as needed for itching.    Marland Kitchen lisinopril (PRINIVIL,ZESTRIL) 20 MG tablet Take 20 mg by mouth daily. Does NOT take on Monday, Wed, Friday    . meclizine (ANTIVERT) 25 MG tablet Take 25 mg by mouth 4 (four) times daily as needed for dizziness.    . metroNIDAZOLE (FLAGYL) 500 MG tablet Take 500 mg by mouth 3 (three) times daily.    . multivitamin (RENA-VIT) TABS tablet Take 1 tablet by mouth at bedtime. 30 tablet 3  . omeprazole (PRILOSEC) 20 MG capsule Take 20 mg by mouth daily as needed (acid  reflux).     . polyethylene glycol (MIRALAX / GLYCOLAX) packet Take 17 g by mouth daily.    . promethazine (PHENERGAN) 25 MG tablet Take 25 mg by mouth every 6 (six) hours as needed.     . sevelamer carbonate (RENVELA) 800 MG tablet Take 800 mg by mouth 3 (three) times daily with meals.    . traZODone (DESYREL) 50 MG tablet Take 50 mg by mouth at bedtime as needed.      No current facility-administered medications for this visit.     Past Surgical History  Procedure Laterality Date  . Cholecystectomy    . Appendectomy    . Shoulder arthroscopy Right     w/ repair of rotator cuff repair  . Elbow tendon surgery Right   . Achilles tendon repair Right   . Cesarean section      X 2   . Hemorrhoid surgery      many years ago  . Av fistula placement Left 12/29/2012    Procedure: ARTERIOVENOUS (AV) FISTULA CREATION;   Surgeon: Elam Dutch, MD;  Location: [facility] OR;  Service: Vascular;  Laterality: Left;  Creation Left Brachial Cephalic Arteriovenous Fistula  . Cardiac catheterization    . Laryngectomy      mass removed- noncancerous  . Lumbar laminectomy      lower back     Allergies  Allergen Reactions  . Doxycycline Nausea And Vomiting  . Lipitor [Atorvastatin] Nausea And Vomiting      Family History  Problem Relation Age of Onset  . COPD Mother   . Thyroid disease Mother   . Multiple sclerosis Father   . Heart disease Father   . Heart attack Father   . Depression Brother     Suicide  . Alcohol abuse Brother   . Heart disease Brother   . Asthma Brother   . Thyroid disease Daughter   . Diabetes Maternal Aunt      Social History Anita [name] reports that she has been smoking Cigarettes and E-cigarettes.  She has a 10 pack-year smoking history. She has never used smokeless tobacco. Anita [name] reports that she does not drink alcohol.   Review of Systems CONSTITUTIONAL: No weight loss, fever, chills, weakness or fatigue.  HEENT: Eyes: No visual loss, blurred vision, double vision or yellow sclerae.No hearing loss, sneezing, congestion, runny nose or sore throat.  SKIN: No rash or itching.  CARDIOVASCULAR: per HPI RESPIRATORY: No shortness of breath, cough or sputum.  GASTROINTESTINAL: No anorexia, nausea, vomiting or diarrhea. No abdominal pain or blood.  GENITOURINARY: No burning on urination, no polyuria NEUROLOGICAL: No headache, dizziness, syncope, paralysis, ataxia, numbness or tingling in the extremities. No change in bowel or bladder control.  MUSCULOSKELETAL: No muscle, back pain, joint pain or stiffness.  LYMPHATICS: No enlarged nodes. No history of splenectomy.  PSYCHIATRIC: No history of depression or anxiety.  ENDOCRINOLOGIC: No reports of sweating, cold or heat intolerance. No polyuria or polydipsia.  Marland Kitchen   Physical Examination p 69 bp 177/70 Wt 152 lbs BMI 24 Gen:  resting comfortably, no acute distress HEENT: no scleral icterus, pupils equal round and reactive, no palptable cervical adenopathy,  CV: RRR, no m/r/g, no JVD Resp: Clear to auscultation bilaterally GI: abdomen is soft, non-tender, non-distended, normal bowel sounds, no hepatosplenomegaly MSK: extremities are warm, no edema.  Skin: warm, no rash Neuro:  no focal deficits Psych: appropriate affect   Diagnostic Studies 06/2013 Echo  LVEF 75%, mild AI, mod LAE, mod TR,  PASP 51  02/2008 Cath Normal coronaries, normal LV function  09/2013 IMPRESSION: Mild atheromatous calcifications are seen in the right carotid bulb and proximal right internal carotid artery consistent with less than 50% diameter stenosis based on ultrasound and Doppler criteria.  Moderate atherosclerotic calcifications are noted in the middle and distal portion of the left common carotid artery. Mild atherosclerotic calcifications are noted in the left carotid bulb and proximal left internal carotid artery consistent with less than 50% diameter stenosis based on ultrasound and Doppler criteria.    Assessment and Plan   1. Afib  - no current symptoms  - normal TSH and LFTs 06/2013 on amio. She reports upcoming labs for her surgery,will follow up on those.   - continue current meds  2. HTN  - elevated. Will increase hydral to 50mg  tid, stop metoprolol due to side effects.    3. Chronic diastolic heart failure  - appears euvolemic today  - continue bp control   4. Non-obstructive CAD  - non-obstructive from prior cath, no current symptoms  - continue risk factor modification   5. Preoperative cardiac evaluation - she is being considered for intermediate risk thyroid surgery. No acute active cardiac conditions. Fairly sedentary lifestyle due to leg weakness. Cath 2009 with normal coronaries, has not developed any new symptoms, do not suspect new on set CAD at this time. Recommend proceeding with  surgery. Would hold eliquis 3 days prior and resume 1 day after surgery.      Arnoldo Lenis, M.D.

## 2014-09-05 NOTE — Patient Instructions (Addendum)
Your physician wants you to follow-up in: 6 months with Dr.Branch You will receive a reminder letter in the mail two months in advance. If you don't receive a letter, please call our office to schedule the follow-up appointment.   STOP Metoprolol   INCREASE Hydralazine to 50 mg three times a day       Thank you for choosing [facility] !

## 2014-09-07 ENCOUNTER — Telehealth: Payer: Self-pay | Admitting: *Deleted

## 2014-09-07 NOTE — Telephone Encounter (Signed)
PT IS ON DILTIAZEM SHE STATES THAT [facility] WERE CALLED FOR HER AND SHE HAS ALWAYS TAKEN PILLS

## 2014-09-07 NOTE — Telephone Encounter (Signed)
Spoke wit pharmacist and they said pt can have capsule or pill,pt already picked up capsules and will keep

## 2014-10-11 NOTE — Pre-Procedure Instructions (Addendum)
Anita [name]  10/11/2014   Your procedure is scheduled on: Thursday, October 19, 2014  Report to [facility] Admitting at 5:30 AM.  Call this number if you have problems the morning of surgery: [phone]   Remember: Follow doctors instructions regarding Eliquis   Do not eat food or drink liquids after midnight Wednesday , October 18, 2014  Take these medicines the morning of surgery with A SIP OF WATER: ALPRAZolam Duanne Moron), hydrALAZINE (APRESOLINE), if needed:HYDROcodone-acetaminophen ([place]) for pain, meclizine (ANTIVERT)  For dizziness, omeprazole (PRILOSEC) for acid reflux, ondansetron (ZOFRAN-ODT) for nausea or vomiting  Stop taking Aspirin, vitamins and herbal medications. Do not take any NSAIDs ie: Ibuprofen, Advil, Naproxen or any medication containing Aspirin; stop 5 days prior to surgery ( Saturday, October 14, 2014 )   Do not wear jewelry, make-up or nail polish.  Do not wear lotions, powders, or perfumes. You may not wear deodorant.  Do not shave 48 hours prior to surgery.   Do not bring valuables to the hospital.  [facility] is not responsible for any belongings or valuables.               Contacts, dentures or bridgework may not be worn into surgery.  Leave suitcase in the car. After surgery it may be brought to your room.  For patients admitted to the hospital, discharge time is determined by your treatment team.               Patients discharged the day of surgery will not be allowed to drive home.  Name and phone number of your driver:  Special Instructions:  Special Instructions:Special Instructions: [facility] - Preparing for Surgery  Before surgery, you can play an important role.  Because skin is not sterile, your skin needs to be as free of germs as possible.  You can reduce the number of germs on you skin by washing with CHG (chlorahexidine gluconate) soap before surgery.  CHG is an antiseptic cleaner which kills germs and bonds with the skin to continue  killing germs even after washing.  Please DO NOT use if you have an allergy to CHG or antibacterial soaps.  If your skin becomes reddened/irritated stop using the CHG and inform your nurse when you arrive at Short Stay.  Do not shave (including legs and underarms) for at least 48 hours prior to the first CHG shower.  You may shave your face.  Please follow these instructions carefully:   1.  Shower with CHG Soap the night before surgery and the morning of Surgery.  2.  If you choose to wash your hair, wash your hair first as usual with your normal shampoo.  3.  After you shampoo, rinse your hair and body thoroughly to remove the Shampoo.  4.  Use CHG as you would any other liquid soap.  You can apply chg directly  to the skin and wash gently with scrungie or a clean washcloth.  5.  Apply the CHG Soap to your body ONLY FROM THE NECK DOWN.  Do not use on open wounds or open sores.  Avoid contact with your eyes, ears, mouth and genitals (private parts).  Wash genitals (private parts) with your normal soap.  6.  Wash thoroughly, paying special attention to the area where your surgery will be performed.  7.  Thoroughly rinse your body with warm water from the neck down.  8.  DO NOT shower/wash with your normal soap after using and rinsing off  the CHG Soap.  9.  Pat yourself dry with a clean towel.            10.  Wear clean pajamas.            11.  Place clean sheets on your bed the night of your first shower and do not sleep with pets.  Day of Surgery  Do not apply any lotions/deodorants the morning of surgery.  Please wear clean clothes to the hospital/surgery center.   Please read over the following fact sheets that you were given: Pain Booklet, Coughing and Deep Breathing and Surgical Site Infection Prevention

## 2014-10-12 ENCOUNTER — Ambulatory Visit (HOSPITAL_COMMUNITY)
Admission: RE | Admit: 2014-10-12 | Discharge: 2014-10-12 | Disposition: A | Discharge status: Home / Self Care | Payer: Medicare Other | Source: Ambulatory Visit | Attending: Anesthesiology | Admitting: Anesthesiology

## 2014-10-12 ENCOUNTER — Encounter (HOSPITAL_COMMUNITY): Payer: Self-pay

## 2014-10-12 ENCOUNTER — Encounter (HOSPITAL_COMMUNITY)
Admission: RE | Admit: 2014-10-12 | Discharge: 2014-10-12 | Disposition: A | Discharge status: Home / Self Care | Payer: Medicare Other | Source: Ambulatory Visit | Attending: Surgery | Admitting: Surgery

## 2014-10-12 DIAGNOSIS — Z01818 Encounter for other preprocedural examination: Secondary | ICD-10-CM | POA: Insufficient documentation

## 2014-10-12 DIAGNOSIS — Z9009 Acquired absence of other part of head and neck: Secondary | ICD-10-CM

## 2014-10-12 DIAGNOSIS — E890 Postprocedural hypothyroidism: Secondary | ICD-10-CM

## 2014-10-12 DIAGNOSIS — E211 Secondary hyperparathyroidism, not elsewhere classified: Secondary | ICD-10-CM | POA: Insufficient documentation

## 2014-10-12 HISTORY — DX: Neoplasm of unspecified behavior of endocrine glands and other parts of nervous system: D49.7

## 2014-10-12 HISTORY — DX: Presence of spectacles and contact lenses: Z97.3

## 2014-10-12 HISTORY — DX: Reserved for inherently not codable concepts without codable children: IMO0001

## 2014-10-12 HISTORY — DX: Type 2 diabetes mellitus with diabetic neuropathy, unspecified: E11.40

## 2014-10-12 LAB — CBC
HCT: 36.2 % (ref 36.0–46.0)
HEMOGLOBIN: 11.6 g/dL — AB (ref 12.0–15.0)
MCH: 28.6 pg (ref 26.0–34.0)
MCHC: 32 g/dL (ref 30.0–36.0)
MCV: 89.2 fL (ref 78.0–100.0)
Platelets: 157 10*3/uL (ref 150–400)
RBC: 4.06 MIL/uL (ref 3.87–5.11)
RDW: 18.4 % — ABNORMAL HIGH (ref 11.5–15.5)
WBC: 5.9 10*3/uL (ref 4.0–10.5)

## 2014-10-12 LAB — BASIC METABOLIC PANEL WITH GFR
Anion gap: 6 (ref 5–15)
BUN: 19 mg/dL (ref 6–23)
CO2: 33 mmol/L — ABNORMAL HIGH (ref 19–32)
Calcium: 8.6 mg/dL (ref 8.4–10.5)
Chloride: 99 mmol/L (ref 96–112)
Creatinine, Ser: 3.16 mg/dL — ABNORMAL HIGH (ref 0.50–1.10)
GFR calc Af Amer: 17 mL/min — ABNORMAL LOW
GFR calc non Af Amer: 14 mL/min — ABNORMAL LOW
Glucose, Bld: 102 mg/dL — ABNORMAL HIGH (ref 70–99)
Potassium: 4 mmol/L (ref 3.5–5.1)
Sodium: 138 mmol/L (ref 135–145)

## 2014-10-12 NOTE — Progress Notes (Signed)
Quick Note:  These results are acceptable for scheduled surgery.  Kush Farabee M. Audel Coakley, MD, FACS [facility] Office: [phone]   ______ 

## 2014-10-12 NOTE — Progress Notes (Signed)
Pt denies SOB and chest pain. Pt stated that she was told by Dr. Carlyle Dolly, cardiologist to stop taking Eliquis 2 days prior to procedure and her general practitioner Dr. Joen Laura advised that she stop 3 days prior. Pt stated that she plans to stop 3 days prior " as I had done in the past."

## 2014-10-12 NOTE — Progress Notes (Signed)
Pt chart forwarded to [place], [place] ( anesthesia) to review cardiac history abnormal labs ( creatinine 3.16 ) and abnormal chest x ray.

## 2014-10-12 NOTE — Progress Notes (Signed)
Quick Note:  These results are acceptable for scheduled surgery.  Rafay Dahan M. Makail Watling, MD, FACS [facility] Office: [phone]   ______ 

## 2014-10-13 NOTE — Progress Notes (Signed)
Anesthesia Chart Review:  Patient is a 64 year old female scheduled for total thyroidectomy on 10/19/14 by Dr. Harlow Asa. DX: thyroid neoplasm of uncertain behavior.  History includes smoking, post-operative N/V, COPD, non-obstructive CAD, AS, ESRD on HD (LUE AVF), chronic diastolic CHF, afib '14, AS ("mild" 06/2013), anemia, dyslipidemia, DM2 with neuropathy, dyslipidemia, HTN, OSA (no CPAP after weight loss), laryngeal mass excision (non-cancerous), cholecystectomy, lumbar laminectomy.  PCP is Dr. Leslie Andrea.   Cardiologist is Dr. Carlyle Dolly. According to his 09/07/14 office note, "she is being considered for intermediate risk thyroid surgery. No acute active cardiac conditions. Fairly sedentary lifestyle due to leg weakness. Cath 2009 with normal coronaries, has not developed any new symptoms, do not suspect new on set CAD at this time. Recommend proceeding with surgery. Would hold eliquis 3 days prior and resume 1 day after surgery."  Meds include Xanax, amiodarone, diltiazem, Eliquis, hydralazine, Norco, lisinopril, Prilosec, Zofran, Renvela, trazodone, Ambien. She reports being instructed to hold Eliquis 3 days prior to surgery. ASA 5 days preoperatively.  09/07/14 EKG: NSR, moderate voltage criteria for LVH, may be normal variant, non-specific ST abnormality, prolonged QT.   07/14/13 Echo: - Left ventricle: The cavity size is small and appears underfilled. There was severe concentric hypertrophy. Theestimated ejection fraction was estimated at ~75%, withbeat to beat variation. LV diastolic function cannot beassessed due to a-fib and rapid rate. - Aortic valve: Mildly calcified without significantstenosis. Transvalvular velocity was minimally increased.Mild regurgitation. Indexed valve area: 0.82cm^2/m^2 (VTI). Peak velocity ratio of LVOT to aortic valve: 0.5. Indexed valve area: 0.78cm^2/m^2 (Vmax). Mean gradient: 29mm Hg (S). Peak gradient: 53mm Hg (S). - Left atrium: Moderately dilated  (39 ml/m2). - Right atrium: Moderate to severely dilated. - Tricuspid valve: Moderate regurgitation. - Pulmonary arteries: PA peak pressure: 62mm Hg (S). - Inferior vena cava: The vessel was dilated; the respirophasic diameter changes were blunted (< 50%);findings are consistent with elevated central venouspressure. - Pericardium, extracardiac: There was no pericardialeffusion.  02/24/08 Cardiac cath: Normal coronary angiograms. Normal LVF. Normal right heart pressures with a tendecy for elevated pulmonary artery pressure.  10/20/13 Carotid duplex: IMPRESSION: Mild atheromatous calcifications are seen in the right carotid bulb and proximal right internal carotid artery consistent with less than 50% diameter stenosis based on ultrasound and Doppler criteria. Moderate atherosclerotic calcifications are noted in the middle and distal portion of the left common carotid artery. Mild atherosclerotic calcifications are noted in the left carotid bulb and proximal left internal carotid artery consistent with less than 50% diameter stenosis based on ultrasound and Doppler criteria.  10/12/14 CXR: FINDINGS: Small bilateral chronic pleural effusions appear similar to the prior examination (right greater than left), with some associated pleuroparenchymal scarring in the periphery of the right lung base. Diffuse interstitial prominence and peribronchial cuffing. Mild cephalization of the pulmonary vasculature. No definite consolidative airspace disease. Upper mediastinal contours are within normal limits. Atherosclerosis in the thoracic aorta. IMPRESSION: 1. Cephalization of the pulmonary vasculature with diffuse interstitial prominence and peribronchial cuffing. Given the cardiomegaly, clinical correlation for signs and symptoms of mild congestive heart failure suggested. 2. Chronic small bilateral (right greater than left) pleural effusions again noted with some chronic pleuroparenchymal scarring in the right mid to  lower lung. 3. Atherosclerosis in the thoracic aorta.  Preoperative labs noted.   Patient has been cleared by her cardiologist following evaluation last month.  She denied chest pain and SOB at her PAT RN interview. She is on hemodialysis. She will get an ISTAT4 on arrival. If  no acute cardiopulmonary issues and follow-up labs are acceptable then I would anticipate that she could proceed as planned.  George Hugh [facility] Short Stay Center/Anesthesiology Phone [phone] 10/13/2014 10:08 AM

## 2014-10-18 ENCOUNTER — Encounter (HOSPITAL_COMMUNITY): Payer: Self-pay | Admitting: Surgery

## 2014-10-18 DIAGNOSIS — D440 Neoplasm of uncertain behavior of thyroid gland: Secondary | ICD-10-CM | POA: Diagnosis present

## 2014-10-18 MED ORDER — CEFAZOLIN SODIUM-DEXTROSE 2-3 GM-% IV SOLR
2.0000 g | INTRAVENOUS | Status: AC
  Administered 2014-10-19: 2 g via INTRAVENOUS
  Filled 2014-10-18: qty 50

## 2014-10-18 NOTE — H&P (Signed)
General Surgery [facility]  Anita [name]. Beeghly DOB: [birth year] Married / Language: English / Race: White Female  History of Present Illness  Patient is referred by Dr. Leslie Andrea for evaluation of suspected medullary thyroid carcinoma. Patient hand found a nodule in the left neck on self-examination. She thought this to the attention of her physicians and subsequently underwent a thyroid ultrasound. Ultrasound was performed on July 20, 2014. This showed an enlarged right thyroid lobe measuring 5.9 cm and containing at least 2 nodules measuring 1 cm in size as well as scattered smaller nodules. Left thyroid lobe was 5.1 cm and contained no visible nodules. In the thyroid isthmus there was a dominant nodule measuring 2.3 cm containing dystrophic calcifications. Subsequent fine-needle aspiration biopsy of the dominant nodule in the isthmus is read as suspicious for neoplasm with features raising the possibility of medullary thyroid carcinoma. Patient is now referred to general surgery for evaluation and recommendations for management. Patient has no prior history of head or neck surgery. She did have vocal cord polyps removed by her ENT physician. She has never been on thyroid medication. There is a family history of thyroid disease and the patient's mother who underwent thyroidectomy for unknown etiology. Patient also has a daughter with hyperthyroidism. There is no family history of other endocrine neoplasm.   Other Problems  Anxiety Disorder Atrial Fibrillation Back Pain Bladder Problems Cholelithiasis Chronic Renal Failure Syndrome Diabetes Mellitus Diverticulosis Gastric Ulcer Gastroesophageal Reflux Disease Heart murmur Hemorrhoids High blood pressure Hypercholesterolemia Sleep Apnea Thyroid Cancer Thyroid Disease  Past Surgical History  Anal Fissure Repair Appendectomy Cesarean Section - 1 Colon Polyp Removal -  Open Dialysis Shunt / Fistula Foot Surgery Right. Gallbladder Surgery - Open Hemorrhoidectomy Oral Surgery Sentinel Lymph Node Biopsy Shoulder Surgery Right. Spinal Surgery - Lower Back  Diagnostic Studies History  Colonoscopy 1-5 years ago Mammogram within last year  Allergies  Doxycycline Hyclate *TETRACYCLINES* Lipitor *ANTIHYPERLIPIDEMICS*  Medication History Meclizine HCl (25MG  Tablet, Oral) Active. HydrOXYzine Pamoate (25MG  Capsule, Oral) Active. Lisinopril (40MG  Tablet, Oral) Active. Metoprolol Tartrate (25MG  Tablet, Oral) Active. Ondansetron (4MG  Tablet Disperse, Oral) Active. Vitamin B12 TR (1000MCG Tablet ER, Oral) Active. MiraLax (Oral) Active. Eliquis (5MG  Tablet, Oral) Active. Amiodarone HCl (200MG  Tablet, Oral) Active. Diltiazem CD (180MG  Capsule ER 24HR, Oral) Active. TraZODone HCl (50MG  Tablet, Oral) Active. Omeprazole (20MG  Capsule DR, Oral) Active. Hydrocodone-Acetaminophen (7.5-325MG  Tablet, Oral) Active. Renvela (800MG  Tablet, Oral) Active. ALPRAZolam ER (1MG  Tablet ER 24HR, Oral) Active.  Social History Alcohol use Remotely quit alcohol use. Caffeine use Carbonated beverages, Coffee, Tea. No drug use Tobacco use Current some day smoker.  Family History  Alcohol Abuse Brother. Bleeding disorder Daughter. Depression Brother. Heart Disease Brother, Father. Heart disease in female family member before age 66 Heart disease in female family member before age 5 Hypertension Father. Migraine Headache Daughter, Father. Respiratory Condition Mother. Thyroid problems Brother, Daughter, Mother, Sister.  Pregnancy / Birth History Age at menarche 70 years. Age of menopause <43 Contraceptive History Intrauterine device, Oral contraceptives. Gravida 2 Irregular periods Maternal age 28-25 Para 2  Review of Systems  General Present- Appetite Loss and Weight Loss. Not Present- Chills, Fatigue, Fever,  Night Sweats and Weight Gain. Skin Not Present- Change in Wart/Mole, Dryness, Hives, Jaundice, New Lesions, Non-Healing Wounds, Rash and Ulcer. HEENT Present- Visual Disturbances. Not Present- Earache, Hearing Loss, Hoarseness, Nose Bleed, Oral Ulcers, Ringing in the Ears, Seasonal Allergies, Sinus Pain, Sore Throat, Wears glasses/contact lenses and Yellow Eyes. Respiratory Present- Snoring. Not  Present- Bloody sputum, Chronic Cough, Difficulty Breathing and Wheezing. Cardiovascular Present- Rapid Heart Rate. Not Present- Chest Pain, Difficulty Breathing Lying Down, Leg Cramps, Palpitations, Shortness of Breath and Swelling of Extremities. Gastrointestinal Present- Gets full quickly at meals, Nausea and Vomiting. Not Present- Abdominal Pain, Bloating, Bloody Stool, Change in Bowel Habits, Chronic diarrhea, Constipation, Difficulty Swallowing, Excessive gas, Hemorrhoids, Indigestion and Rectal Pain. Female Genitourinary Present- Frequency and Nocturia. Not Present- Painful Urination, Pelvic Pain and Urgency. Musculoskeletal Present- Back Pain and Muscle Weakness. Not Present- Joint Pain, Joint Stiffness, Muscle Pain and Swelling of Extremities. Neurological Present- Tremor, Trouble walking and Weakness. Not Present- Decreased Memory, Fainting, Headaches, Numbness, Seizures and Tingling. Psychiatric Present- Anxiety and Change in Sleep Pattern. Not Present- Bipolar, Depression, Fearful and Frequent crying. Endocrine Present- Cold Intolerance, Hair Changes and Heat Intolerance. Not Present- Excessive Hunger, Hot flashes and New Diabetes. Hematology Present- Easy Bruising. Not Present- Excessive bleeding, Gland problems, HIV and Persistent Infections.   Vitals 08/24/2014 1:51 PM Weight: 155.38 lb Height: 66in Body Surface Area: 1.81 m Body Mass Index: 25.08 kg/m Temp.: 98.38F  Pulse: 78 (Regular)  BP: 170/60 (Sitting, Left Arm, Standard)    Physical Exam  General - appears  comfortable, no distress; not diaphorectic  HEENT - normocephalic; sclerae clear, gaze conjugate; mucous membranes moist, dentition good; voice normal  Neck - symmetric on extension; no palpable anterior or posterior cervical adenopathy; 2 cm smooth, mobile, nontender nodule in the thyroid isthmus; right thyroid lobe without palpable nodularity; left thyroid lobe without palpable dominant or discrete mass; no supraclavicular masses  Chest - clear bilaterally with rhonchi, rales, or wheeze  Cor - irregular rhythm with normal rate; no significant murmur  Ext - non-tender without significant edema or lymphedema; AV access for dialysis in the left upper arm  Neuro - grossly intact; no tremor    Assessment & Plan  NEOPLASM OF UNCERTAIN BEHAVIOR OF THYROID GLAND (237.4  D44.0)  Patient has a 2.3 cm neoplasm in the thyroid suspicious for medullary thyroid carcinoma. There are additional thyroid nodules. She presents today accompanied by her sister to discuss these findings in detail. I also provided them with written literature to review at home.  I have recommended total thyroidectomy with possible limited lymph node dissection for evaluation of suspected medullary thyroid carcinoma. As there is no family history of endocrine neoplasm or other medullary thyroid carcinoma, this is likely a sporadic tumor.  Calcitonin level is <2.  I discussed total thyroidectomy with the patient and her sister at length. We discussed the procedure, the surgical incision, the hospital stay, and the postoperative recovery. We discussed the need for lifelong thyroid hormone replacement. They understand and wish to proceed with surgery.  Earnstine Regal, MD, [facility] Office: [phone]

## 2014-10-19 ENCOUNTER — Encounter (HOSPITAL_COMMUNITY)
Admission: RE | Disposition: A | Discharge status: Home / Self Care | Payer: Self-pay | Source: Ambulatory Visit | Attending: Surgery

## 2014-10-19 ENCOUNTER — Inpatient Hospital Stay (HOSPITAL_COMMUNITY): Payer: Medicare Other | Admitting: Vascular Surgery

## 2014-10-19 ENCOUNTER — Encounter (HOSPITAL_COMMUNITY): Payer: Self-pay | Admitting: Surgery

## 2014-10-19 ENCOUNTER — Inpatient Hospital Stay (HOSPITAL_COMMUNITY): Payer: Medicare Other | Admitting: Anesthesiology

## 2014-10-19 ENCOUNTER — Observation Stay (HOSPITAL_COMMUNITY)
Admission: RE | Admit: 2014-10-19 | Discharge: 2014-10-20 | Disposition: A | Discharge status: Home / Self Care | Payer: Medicare Other | Source: Ambulatory Visit | Attending: Surgery | Admitting: Surgery

## 2014-10-19 DIAGNOSIS — N186 End stage renal disease: Secondary | ICD-10-CM | POA: Insufficient documentation

## 2014-10-19 DIAGNOSIS — J449 Chronic obstructive pulmonary disease, unspecified: Secondary | ICD-10-CM | POA: Insufficient documentation

## 2014-10-19 DIAGNOSIS — G4730 Sleep apnea, unspecified: Secondary | ICD-10-CM | POA: Insufficient documentation

## 2014-10-19 DIAGNOSIS — E785 Hyperlipidemia, unspecified: Secondary | ICD-10-CM | POA: Diagnosis not present

## 2014-10-19 DIAGNOSIS — D440 Neoplasm of uncertain behavior of thyroid gland: Secondary | ICD-10-CM | POA: Diagnosis present

## 2014-10-19 DIAGNOSIS — I120 Hypertensive chronic kidney disease with stage 5 chronic kidney disease or end stage renal disease: Secondary | ICD-10-CM | POA: Insufficient documentation

## 2014-10-19 DIAGNOSIS — I509 Heart failure, unspecified: Secondary | ICD-10-CM | POA: Insufficient documentation

## 2014-10-19 DIAGNOSIS — I4891 Unspecified atrial fibrillation: Secondary | ICD-10-CM | POA: Insufficient documentation

## 2014-10-19 DIAGNOSIS — I2510 Atherosclerotic heart disease of native coronary artery without angina pectoris: Secondary | ICD-10-CM | POA: Insufficient documentation

## 2014-10-19 DIAGNOSIS — C73 Malignant neoplasm of thyroid gland: Principal | ICD-10-CM | POA: Insufficient documentation

## 2014-10-19 DIAGNOSIS — E119 Type 2 diabetes mellitus without complications: Secondary | ICD-10-CM | POA: Insufficient documentation

## 2014-10-19 DIAGNOSIS — E1140 Type 2 diabetes mellitus with diabetic neuropathy, unspecified: Secondary | ICD-10-CM | POA: Diagnosis not present

## 2014-10-19 DIAGNOSIS — I5032 Chronic diastolic (congestive) heart failure: Secondary | ICD-10-CM | POA: Insufficient documentation

## 2014-10-19 DIAGNOSIS — E11319 Type 2 diabetes mellitus with unspecified diabetic retinopathy without macular edema: Secondary | ICD-10-CM | POA: Diagnosis not present

## 2014-10-19 DIAGNOSIS — Z992 Dependence on renal dialysis: Secondary | ICD-10-CM | POA: Insufficient documentation

## 2014-10-19 DIAGNOSIS — E1121 Type 2 diabetes mellitus with diabetic nephropathy: Secondary | ICD-10-CM | POA: Diagnosis not present

## 2014-10-19 DIAGNOSIS — K219 Gastro-esophageal reflux disease without esophagitis: Secondary | ICD-10-CM | POA: Insufficient documentation

## 2014-10-19 HISTORY — PX: THYROIDECTOMY: SHX17

## 2014-10-19 LAB — GLUCOSE, CAPILLARY
GLUCOSE-CAPILLARY: 100 mg/dL — AB (ref 70–99)
GLUCOSE-CAPILLARY: 135 mg/dL — AB (ref 70–99)
Glucose-Capillary: 83 mg/dL (ref 70–99)
Glucose-Capillary: 204 mg/dL — ABNORMAL HIGH (ref 70–99)

## 2014-10-19 LAB — POCT I-STAT 4, (NA,K, GLUC, HGB,HCT)
Glucose, Bld: 87 mg/dL (ref 70–99)
HCT: 32 % — ABNORMAL LOW (ref 36.0–46.0)
Hemoglobin: 10.9 g/dL — ABNORMAL LOW (ref 12.0–15.0)
POTASSIUM: 3.9 mmol/L (ref 3.5–5.1)
SODIUM: 138 mmol/L (ref 135–145)

## 2014-10-19 SURGERY — THYROIDECTOMY
Anesthesia: General | Site: Neck

## 2014-10-19 MED ORDER — HYDRALAZINE HCL 50 MG PO TABS
50.0000 mg | ORAL_TABLET | Freq: Three times a day (TID) | ORAL | Status: DC
  Administered 2014-10-19 (×2): 50 mg via ORAL
  Filled 2014-10-19 (×6): qty 1

## 2014-10-19 MED ORDER — LIDOCAINE-PRILOCAINE 2.5-2.5 % EX CREA: 1.0000 "application " | TOPICAL_CREAM | CUTANEOUS | Status: DC | PRN

## 2014-10-19 MED ORDER — ALTEPLASE 2 MG IJ SOLR
2.0000 mg | Freq: Once | INTRAMUSCULAR | Status: AC | PRN
  Filled 2014-10-19: qty 2

## 2014-10-19 MED ORDER — ROCURONIUM BROMIDE 50 MG/5ML IV SOLN
INTRAVENOUS | Status: AC
  Filled 2014-10-19: qty 1

## 2014-10-19 MED ORDER — GLYCOPYRROLATE 0.2 MG/ML IJ SOLN
INTRAMUSCULAR | Status: DC | PRN
  Administered 2014-10-19: 0.6 mg via INTRAVENOUS

## 2014-10-19 MED ORDER — VECURONIUM BROMIDE 10 MG IV SOLR
INTRAVENOUS | Status: DC | PRN
  Administered 2014-10-19: 5 mg via INTRAVENOUS
  Administered 2014-10-19: 2 mg via INTRAVENOUS

## 2014-10-19 MED ORDER — HYDROMORPHONE HCL 1 MG/ML IJ SOLN
INTRAMUSCULAR | Status: AC
  Filled 2014-10-19: qty 1

## 2014-10-19 MED ORDER — EPHEDRINE SULFATE 50 MG/ML IJ SOLN
INTRAMUSCULAR | Status: AC
  Filled 2014-10-19: qty 1

## 2014-10-19 MED ORDER — AMIODARONE HCL 200 MG PO TABS
200.0000 mg | ORAL_TABLET | Freq: Every day | ORAL | Status: DC
  Administered 2014-10-19: 200 mg via ORAL
  Filled 2014-10-19 (×3): qty 1

## 2014-10-19 MED ORDER — ONDANSETRON 4 MG PO TBDP
4.0000 mg | ORAL_TABLET | Freq: Three times a day (TID) | ORAL | Status: DC | PRN
  Filled 2014-10-19: qty 1

## 2014-10-19 MED ORDER — SODIUM CHLORIDE 0.9 % IV SOLN: 100.0000 mL | INTRAVENOUS | Status: DC | PRN

## 2014-10-19 MED ORDER — SCOPOLAMINE 1 MG/3DAYS TD PT72
MEDICATED_PATCH | TRANSDERMAL | Status: AC
  Filled 2014-10-19: qty 1

## 2014-10-19 MED ORDER — SODIUM CHLORIDE 0.9 % IJ SOLN
INTRAMUSCULAR | Status: AC
  Filled 2014-10-19: qty 10

## 2014-10-19 MED ORDER — FENTANYL CITRATE 0.05 MG/ML IJ SOLN
INTRAMUSCULAR | Status: AC
  Filled 2014-10-19: qty 5

## 2014-10-19 MED ORDER — GLYCOPYRROLATE 0.2 MG/ML IJ SOLN
INTRAMUSCULAR | Status: AC
  Filled 2014-10-19: qty 1

## 2014-10-19 MED ORDER — INSULIN ASPART 100 UNIT/ML ~~LOC~~ SOLN: 0.0000 [IU] | Freq: Three times a day (TID) | SUBCUTANEOUS | Status: DC

## 2014-10-19 MED ORDER — ONDANSETRON HCL 4 MG PO TABS: 4.0000 mg | ORAL_TABLET | Freq: Four times a day (QID) | ORAL | Status: DC | PRN

## 2014-10-19 MED ORDER — DEXAMETHASONE SODIUM PHOSPHATE 4 MG/ML IJ SOLN
INTRAMUSCULAR | Status: DC | PRN
  Administered 2014-10-19: 4 mg via INTRAVENOUS

## 2014-10-19 MED ORDER — HEMOSTATIC AGENTS (NO CHARGE) OPTIME
TOPICAL | Status: DC | PRN
  Administered 2014-10-19: 1 via TOPICAL

## 2014-10-19 MED ORDER — DEXAMETHASONE SODIUM PHOSPHATE 4 MG/ML IJ SOLN
INTRAMUSCULAR | Status: AC
  Filled 2014-10-19: qty 1

## 2014-10-19 MED ORDER — HYDROMORPHONE HCL 1 MG/ML IJ SOLN
0.2500 mg | INTRAMUSCULAR | Status: DC | PRN
  Administered 2014-10-19 (×4): 0.25 mg via INTRAVENOUS

## 2014-10-19 MED ORDER — ONDANSETRON HCL 4 MG/2ML IJ SOLN: 4.0000 mg | Freq: Four times a day (QID) | INTRAMUSCULAR | Status: DC | PRN

## 2014-10-19 MED ORDER — DILTIAZEM HCL ER COATED BEADS 180 MG PO CP24
180.0000 mg | ORAL_CAPSULE | Freq: Every day | ORAL | Status: DC
  Administered 2014-10-19: 180 mg via ORAL
  Filled 2014-10-19 (×3): qty 1

## 2014-10-19 MED ORDER — CALCIUM CARBONATE 1250 (500 CA) MG PO TABS
2.0000 | ORAL_TABLET | Freq: Two times a day (BID) | ORAL | Status: DC
  Filled 2014-10-19 (×2): qty 2

## 2014-10-19 MED ORDER — HYDROCODONE-ACETAMINOPHEN 5-325 MG PO TABS
1.0000 | ORAL_TABLET | ORAL | Status: DC | PRN
  Administered 2014-10-19 (×2): 1 via ORAL
  Filled 2014-10-19 (×2): qty 1

## 2014-10-19 MED ORDER — MECLIZINE HCL 25 MG PO TABS
25.0000 mg | ORAL_TABLET | Freq: Four times a day (QID) | ORAL | Status: DC | PRN
  Filled 2014-10-19: qty 1

## 2014-10-19 MED ORDER — LIDOCAINE HCL (CARDIAC) 20 MG/ML IV SOLN
INTRAVENOUS | Status: DC | PRN
  Administered 2014-10-19: 40 mg via INTRAVENOUS

## 2014-10-19 MED ORDER — SEVELAMER CARBONATE 800 MG PO TABS
2400.0000 mg | ORAL_TABLET | Freq: Three times a day (TID) | ORAL | Status: DC
  Administered 2014-10-20: 2400 mg via ORAL
  Filled 2014-10-19 (×4): qty 3

## 2014-10-19 MED ORDER — ACETAMINOPHEN 325 MG PO TABS: 650.0000 mg | ORAL_TABLET | ORAL | Status: DC | PRN

## 2014-10-19 MED ORDER — DOXERCALCIFEROL 4 MCG/2ML IV SOLN
1.0000 ug | INTRAVENOUS | Status: DC
  Filled 2014-10-19: qty 2

## 2014-10-19 MED ORDER — NEPRO/CARBSTEADY PO LIQD: 237.0000 mL | ORAL | Status: DC | PRN

## 2014-10-19 MED ORDER — PROPOFOL 10 MG/ML IV BOLUS
INTRAVENOUS | Status: DC | PRN
  Administered 2014-10-19: 80 mg via INTRAVENOUS

## 2014-10-19 MED ORDER — SODIUM CHLORIDE 0.9 % IV SOLN
INTRAVENOUS | Status: DC
  Administered 2014-10-19: 50 mL/h via INTRAVENOUS

## 2014-10-19 MED ORDER — ALPRAZOLAM 0.5 MG PO TABS
0.5000 mg | ORAL_TABLET | Freq: Four times a day (QID) | ORAL | Status: DC | PRN
  Administered 2014-10-20: 0.5 mg via ORAL
  Filled 2014-10-19: qty 1

## 2014-10-19 MED ORDER — PROPOFOL 10 MG/ML IV BOLUS
INTRAVENOUS | Status: AC
  Filled 2014-10-19: qty 20

## 2014-10-19 MED ORDER — CALCIUM CARBONATE ANTACID 500 MG PO CHEW: 1.0000 | CHEWABLE_TABLET | Freq: Two times a day (BID) | ORAL | Status: DC | PRN

## 2014-10-19 MED ORDER — SODIUM CHLORIDE 0.9 % IV SOLN
INTRAVENOUS | Status: DC | PRN
  Administered 2014-10-19: 07:00:00 via INTRAVENOUS

## 2014-10-19 MED ORDER — ZOLPIDEM TARTRATE 5 MG PO TABS
5.0000 mg | ORAL_TABLET | Freq: Every evening | ORAL | Status: DC | PRN
  Administered 2014-10-19: 5 mg via ORAL
  Filled 2014-10-19: qty 1

## 2014-10-19 MED ORDER — NEOSTIGMINE METHYLSULFATE 10 MG/10ML IV SOLN
INTRAVENOUS | Status: DC | PRN
  Administered 2014-10-19: 4 mg via INTRAVENOUS

## 2014-10-19 MED ORDER — ONDANSETRON HCL 4 MG/2ML IJ SOLN
INTRAMUSCULAR | Status: DC | PRN
  Administered 2014-10-19: 4 mg via INTRAVENOUS

## 2014-10-19 MED ORDER — HYDROMORPHONE HCL 1 MG/ML IJ SOLN
1.0000 mg | INTRAMUSCULAR | Status: DC | PRN
  Administered 2014-10-19 – 2014-10-20 (×5): 1 mg via INTRAVENOUS
  Filled 2014-10-19 (×5): qty 1

## 2014-10-19 MED ORDER — ONDANSETRON HCL 4 MG/2ML IJ SOLN
INTRAMUSCULAR | Status: AC
  Filled 2014-10-19: qty 2

## 2014-10-19 MED ORDER — PENTAFLUOROPROP-TETRAFLUOROETH EX AERO: 1.0000 "application " | INHALATION_SPRAY | CUTANEOUS | Status: DC | PRN

## 2014-10-19 MED ORDER — PHENYLEPHRINE HCL 10 MG/ML IJ SOLN
10.0000 mg | INTRAMUSCULAR | Status: DC | PRN
  Administered 2014-10-19: 50 ug/min via INTRAVENOUS

## 2014-10-19 MED ORDER — LISINOPRIL 40 MG PO TABS
40.0000 mg | ORAL_TABLET | Freq: Two times a day (BID) | ORAL | Status: DC
  Administered 2014-10-19 – 2014-10-20 (×2): 40 mg via ORAL
  Filled 2014-10-19 (×5): qty 1

## 2014-10-19 MED ORDER — FENTANYL CITRATE 0.05 MG/ML IJ SOLN
INTRAMUSCULAR | Status: DC | PRN
  Administered 2014-10-19: 100 ug via INTRAVENOUS
  Administered 2014-10-19 (×2): 50 ug via INTRAVENOUS

## 2014-10-19 MED ORDER — PANTOPRAZOLE SODIUM 40 MG PO TBEC
40.0000 mg | DELAYED_RELEASE_TABLET | Freq: Every day | ORAL | Status: DC
  Administered 2014-10-19 – 2014-10-20 (×2): 40 mg via ORAL
  Filled 2014-10-19 (×2): qty 1

## 2014-10-19 MED ORDER — MIDAZOLAM HCL 2 MG/2ML IJ SOLN
INTRAMUSCULAR | Status: AC
  Filled 2014-10-19: qty 2

## 2014-10-19 MED ORDER — SCOPOLAMINE 1 MG/3DAYS TD PT72
MEDICATED_PATCH | TRANSDERMAL | Status: DC | PRN
  Administered 2014-10-19: 1 via TRANSDERMAL

## 2014-10-19 MED ORDER — LIDOCAINE HCL (PF) 1 % IJ SOLN: 5.0000 mL | INTRAMUSCULAR | Status: DC | PRN

## 2014-10-19 MED ORDER — HEPARIN SODIUM (PORCINE) 1000 UNIT/ML DIALYSIS
1000.0000 [IU] | INTRAMUSCULAR | Status: DC | PRN
  Filled 2014-10-19: qty 1

## 2014-10-19 MED ORDER — 0.9 % SODIUM CHLORIDE (POUR BTL) OPTIME
TOPICAL | Status: DC | PRN
  Administered 2014-10-19: 1000 mL

## 2014-10-19 SURGICAL SUPPLY — 62 items
APL SKNCLS STERI-STRIP NONHPOA (GAUZE/BANDAGES/DRESSINGS)
ATTRACTOMAT 16X20 MAGNETIC DRP (DRAPES) ×3 IMPLANT
BENZOIN TINCTURE PRP APPL 2/3 (GAUZE/BANDAGES/DRESSINGS) ×1 IMPLANT
BLADE SURG 10 STRL SS (BLADE) ×3 IMPLANT
BLADE SURG 15 STRL LF DISP TIS (BLADE) ×1 IMPLANT
BLADE SURG 15 STRL SS (BLADE) ×3
BLADE SURG ROTATE 9660 (MISCELLANEOUS) IMPLANT
CANISTER SUCTION 2500CC (MISCELLANEOUS) ×3 IMPLANT
CHLORAPREP W/TINT 10.5 ML (MISCELLANEOUS) ×3 IMPLANT
CLIP TI MEDIUM 24 (CLIP) ×3 IMPLANT
CLIP TI WIDE RED SMALL 24 (CLIP) ×5 IMPLANT
CLOSURE WOUND 1/2 X4 (GAUZE/BANDAGES/DRESSINGS)
CONT SPEC 4OZ CLIKSEAL STRL BL (MISCELLANEOUS) IMPLANT
COVER SURGICAL LIGHT HANDLE (MISCELLANEOUS) ×3 IMPLANT
CRADLE DONUT ADULT HEAD (MISCELLANEOUS) ×3 IMPLANT
DRAPE PED LAPAROTOMY (DRAPES) ×3 IMPLANT
DRAPE UTILITY XL STRL (DRAPES) ×4 IMPLANT
ELECT CAUTERY BLADE 6.4 (BLADE) ×3 IMPLANT
ELECT REM PT RETURN 9FT ADLT (ELECTROSURGICAL) ×3
ELECTRODE REM PT RTRN 9FT ADLT (ELECTROSURGICAL) ×1 IMPLANT
GAUZE SPONGE 4X4 12PLY STRL (GAUZE/BANDAGES/DRESSINGS) ×1 IMPLANT
GAUZE SPONGE 4X4 16PLY XRAY LF (GAUZE/BANDAGES/DRESSINGS) ×3 IMPLANT
GLOVE BIO SURGEON STRL SZ7 (GLOVE) ×4 IMPLANT
GLOVE BIO SURGEON STRL SZ7.5 (GLOVE) ×2 IMPLANT
GLOVE BIO SURGEON STRL SZ8 (GLOVE) ×2 IMPLANT
GLOVE BIOGEL PI IND STRL 6 (GLOVE) IMPLANT
GLOVE BIOGEL PI IND STRL 6.5 (GLOVE) IMPLANT
GLOVE BIOGEL PI IND STRL 7.0 (GLOVE) IMPLANT
GLOVE BIOGEL PI IND STRL 7.5 (GLOVE) IMPLANT
GLOVE BIOGEL PI IND STRL 8 (GLOVE) IMPLANT
GLOVE BIOGEL PI INDICATOR 6 (GLOVE) ×2
GLOVE BIOGEL PI INDICATOR 6.5 (GLOVE) ×2
GLOVE BIOGEL PI INDICATOR 7.0 (GLOVE) ×4
GLOVE BIOGEL PI INDICATOR 7.5 (GLOVE) ×2
GLOVE BIOGEL PI INDICATOR 8 (GLOVE) ×4
GLOVE SURG ORTHO 8.0 STRL STRW (GLOVE) ×3 IMPLANT
GLOVE SURG SS PI 6.5 STRL IVOR (GLOVE) ×2 IMPLANT
GOWN STRL REUS W/ TWL LRG LVL3 (GOWN DISPOSABLE) ×2 IMPLANT
GOWN STRL REUS W/ TWL XL LVL3 (GOWN DISPOSABLE) ×1 IMPLANT
GOWN STRL REUS W/TWL LRG LVL3 (GOWN DISPOSABLE) ×12
GOWN STRL REUS W/TWL XL LVL3 (GOWN DISPOSABLE) ×6
HEMOSTAT SURGICEL 2X4 FIBR (HEMOSTASIS) ×3 IMPLANT
KIT BASIN OR (CUSTOM PROCEDURE TRAY) ×3 IMPLANT
KIT ROOM TURNOVER OR (KITS) ×3 IMPLANT
LIQUID BAND (GAUZE/BANDAGES/DRESSINGS) ×2 IMPLANT
NS IRRIG 1000ML POUR BTL (IV SOLUTION) ×3 IMPLANT
PACK SURGICAL SETUP 50X90 (CUSTOM PROCEDURE TRAY) ×3 IMPLANT
PAD ARMBOARD 7.5X6 YLW CONV (MISCELLANEOUS) ×3 IMPLANT
PENCIL BUTTON HOLSTER BLD 10FT (ELECTRODE) ×3 IMPLANT
SHEARS HARMONIC 9CM CVD (BLADE) ×3 IMPLANT
SPECIMEN JAR MEDIUM (MISCELLANEOUS) ×2 IMPLANT
SPONGE INTESTINAL PEANUT (DISPOSABLE) ×3 IMPLANT
STRIP CLOSURE SKIN 1/2X4 (GAUZE/BANDAGES/DRESSINGS) ×1 IMPLANT
SUT MNCRL AB 4-0 PS2 18 (SUTURE) ×3 IMPLANT
SUT SILK 2 0 (SUTURE) ×3
SUT SILK 2-0 18XBRD TIE 12 (SUTURE) ×1 IMPLANT
SUT VIC AB 3-0 SH 18 (SUTURE) ×3 IMPLANT
SYR BULB 3OZ (MISCELLANEOUS) ×3 IMPLANT
TOWEL OR 17X24 6PK STRL BLUE (TOWEL DISPOSABLE) ×3 IMPLANT
TOWEL OR 17X26 10 PK STRL BLUE (TOWEL DISPOSABLE) ×3 IMPLANT
TUBE CONNECTING 12'X1/4 (SUCTIONS) ×1
TUBE CONNECTING 12X1/4 (SUCTIONS) ×2 IMPLANT

## 2014-10-19 NOTE — Op Note (Signed)
Procedure Note  Pre-operative Diagnosis:  Thyroid neoplasm of uncertain behavior, rule out medullary carcinoma  Post-operative Diagnosis:  same  Surgeon:  Earnstine Regal, MD, FACS  Assistant:  Sharyn Dross, RNFA   Procedure:  Total thyroidectomy  Anesthesia:  General  Estimated Blood Loss:  minimal  Drains: none         Specimen: thyroid to pathology  Indications:  Patient is referred by Dr. Leslie Andrea for evaluation of suspected medullary thyroid carcinoma. Patient hand found a nodule in the left neck on self-examination. She thought this to the attention of her physicians and subsequently underwent a thyroid ultrasound. Ultrasound was performed on July 20, 2014. This showed an enlarged right thyroid lobe measuring 5.9 cm and containing at least 2 nodules measuring 1 cm in size as well as scattered smaller nodules. Left thyroid lobe was 5.1 cm and contained no visible nodules. In the thyroid isthmus there was a dominant nodule measuring 2.3 cm containing dystrophic calcifications. Subsequent fine-needle aspiration biopsy of the dominant nodule in the isthmus is read as suspicious for neoplasm with features raising the possibility of medullary thyroid carcinoma. Patient is now comes to surgery for total thyroidectomy.  Procedure Details: Procedure was done in OR #1 at the [facility].  The patient was brought to the operating room and placed in a supine position on the operating room table.  Following administration of general anesthesia, the patient was positioned and then prepped and draped in the usual aseptic fashion.  After ascertaining that an adequate level of anesthesia had been achieved, a Kocher incision was made with #15 blade.  Dissection was carried through subcutaneous tissues and platysma. Hemostasis was achieved with the electrocautery.  Skin flaps were elevated cephalad and caudad from the thyroid notch to the sternal notch.  The Mahorner  self-retaining retractor was placed for exposure.  Strap muscles were incised in the midline and dissection was begun on the left side.  Strap muscles were reflected laterally.  Left thyroid lobe was mildly enlarged and multinodular.  The left lobe was gently mobilized with blunt dissection.  Superior pole vessels were dissected out and divided individually between small and medium Ligaclips with the Harmonic scalpel.  The thyroid lobe was rolled anteriorly.  Branches of the inferior thyroid artery were divided between small Ligaclips with the Harmonic scalpel.  Inferior venous tributaries were divided between Ligaclips.  Both the superior and inferior parathyroid glands were identified and preserved on their vascular pedicles.  The recurrent laryngeal nerve was identified and preserved along its course.  The ligament of Gwenlyn Found was released with the electrocautery and the gland was mobilized onto the anterior trachea. Isthmus was mobilized across the midline.  There was moderately large pyramidal lobe present.  This was resected off the anterior surface of the thyroid cartilage and resected with the isthmus.  Dry pack was placed in the left neck.  Next, the right thyroid lobe was gently mobilized with blunt dissection.  Right thyroid lobe was moderately enlarged and multinodular.  Superior pole vessels were dissected out and divided between small and medium Ligaclips with the Harmonic scalpel.  Superior parathyroid was identified and preserved.  Inferior venous tributaries were divided between medium Ligaclips with the Harmonic scalpel.  The right thyroid lobe was rolled anteriorly and the branches of the inferior thyroid artery divided between small Ligaclips.  The right recurrent laryngeal nerve was identified and preserved along its course.  The ligament of Gwenlyn Found was released with the electrocautery.  The  right thyroid lobe was mobilized onto the anterior trachea and the remainder of the thyroid was dissected  off the anterior trachea and the thyroid was completely excised.  A suture was used to mark the right superior pole. The entire thyroid gland was submitted to pathology for review.  The neck was irrigated with warm saline.  Fibular was placed throughout the operative field.  Strap muscles were reapproximated in the midline with interrupted 3-0 Vicryl sutures.  Platysma was closed with interrupted 3-0 Vicryl sutures.  Skin was closed with a running 4-0 Monocryl subcuticular suture.  Wound was washed and dried and topical glue (Dermabond) was applied to the skin as dressing.  The patient was awakened from anesthesia and brought to the recovery room.  The patient tolerated the procedure well.   Earnstine Regal, MD, [facility] Office: [phone]

## 2014-10-19 NOTE — Consult Note (Signed)
Indication for Consultation:  Management of ESRD/hemodialysis; anemia, hypertension/volume and secondary hyperparathyroidism  HPI: Anita [name] is a 64 y.o. female who was admitted for scheduled total thyroidectomy by Dr Harlow Asa. She receives HD MWF @ [place]. She found a nodule on her neck on self exam. She was initially seen by Dr Karie Kirks and underwent thyroid US which showed enlarged R thyroid lobe and containing 2 nodules and smaller scattered nodules. L thyroid lobe showed no nodules. She had a biopsy whish was suspicious for neoplasm, possibly medullary thyroid carcinoma. She presented today for total thyroidectomy. Will arrange HD tomorrow morning, tentatively planned for DC after HD  Past Medical History  Diagnosis Date  . Neuropathy due to secondary diabetes   . Diabetic nephropathy   . Hypertension   . Anemia   . Dyslipidemia   . Laryngeal mass   . COPD (chronic obstructive pulmonary disease)   . GERD (gastroesophageal reflux disease)   . Sleep apnea     Does not use CPAP- due to weight loss  . Atrial fibrillation     Diagnosed 11/14 of undetermined age of onset    . 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 CAD (coronary artery disease) 07/13/2013    Catheterization 5 years ago by Dr. Elisabeth Cara with reportedly nonobstructive disease Calcification noted on CT scan of chest in November of 2014   . ESRD on hemodialysis   . Chronic diastolic heart failure   . Aortic stenosis 12/23/2012  . 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 Atherosclerosis of aorta   . PONV (postoperative nausea and vomiting)   . Shortness of breath dyspnea     with exertion  . Wears glasses   . CHF (congestive heart failure)   . Thyroid neoplasm     of uncertain behavior  . Diabetic neuropathy    Past Surgical History  Procedure Laterality Date  . Cholecystectomy    . Appendectomy    . Shoulder arthroscopy Right     w/ repair of rotator cuff repair  . Elbow tendon surgery Right   .  Achilles tendon repair Right   . Cesarean section      X 2   . Hemorrhoid surgery      many years ago  . Av fistula placement Left 12/29/2012    Procedure: ARTERIOVENOUS (AV) FISTULA CREATION;  Surgeon: Elam Dutch, MD;  Location: [facility] OR;  Service: Vascular;  Laterality: Left;  Creation Left Brachial Cephalic Arteriovenous Fistula  . Cardiac catheterization    . Laryngectomy      mass removed- noncancerous  . Lumbar laminectomy      lower back   Family History  Problem Relation Age of Onset  . COPD Mother   . Thyroid disease Mother   . Multiple sclerosis Father   . Heart disease Father   . Heart attack Father   . Depression Brother     Suicide  . Alcohol abuse Brother   . Heart disease Brother   . Asthma Brother   . Thyroid disease Daughter   . Diabetes Maternal Aunt    Social History:  reports that she has been smoking Cigarettes and E-cigarettes.  She started smoking about 45 years ago. She has a 20 pack-year smoking history. She has never used smokeless tobacco. She reports that she does not drink alcohol or use illicit drugs. Allergies  Allergen Reactions  . Doxycycline Nausea And Vomiting  . Lipitor [Atorvastatin] Nausea And Vomiting   Prior  to Admission medications   Medication Sig Start Date End Date Taking? Authorizing Provider  ACCU-CHEK AVIVA PLUS test strip  08/25/13  Yes Historical Provider, MD  ALPRAZolam Duanne Moron) 1 MG tablet Take 0.5-1 mg by mouth 4 (four) times daily. Patient takes 1/2 tablet 2-3 times daily and 1 tablet at bedtime   Yes Historical Provider, MD  amiodarone (PACERONE) 200 MG tablet Take 200 mg by mouth at bedtime.   Yes Historical Provider, MD  calcium carbonate (TUMS - DOSED IN MG ELEMENTAL CALCIUM) 500 MG chewable tablet Chew 1 tablet by mouth 2 (two) times daily as needed for heartburn (Takes with snacks. In place of Renvela).   Yes Historical Provider, MD  Cyanocobalamin (VITAMIN B 12 PO) Take 1,000 mg by mouth daily.   Yes Historical  Provider, MD  diltiazem (TIAZAC) 180 MG 24 hr capsule Take 1 capsule (180 mg total) by mouth at bedtime. 09/05/14  Yes Arnoldo Lenis, MD  ELIQUIS 5 MG TABS tablet Take 5 mg by mouth 2 (two) times daily.  01/01/14  Yes Historical Provider, MD  hydrALAZINE (APRESOLINE) 50 MG tablet Take 1 tablet (50 mg total) by mouth 3 (three) times daily. 09/05/14  Yes Arnoldo Lenis, MD  HYDROcodone-acetaminophen (NORCO) 7.5-325 MG per tablet Take 1 tablet by mouth every 6 (six) hours as needed for pain. 02/15/13  Yes Regina J Roczniak, PA-C  lisinopril (PRINIVIL,ZESTRIL) 40 MG tablet Take 40 mg by mouth 2 (two) times daily.   Yes Historical Provider, MD  meclizine (ANTIVERT) 25 MG tablet Take 25 mg by mouth 4 (four) times daily as needed for dizziness.   Yes Historical Provider, MD  multivitamin (RENA-VIT) TABS tablet Take 1 tablet by mouth at bedtime. 07/18/13  Yes Blain Pais, MD  omeprazole (PRILOSEC) 20 MG capsule Take 20 mg by mouth daily as needed (acid reflux).    Yes Historical Provider, MD  ondansetron (ZOFRAN-ODT) 4 MG disintegrating tablet Take 4 mg by mouth every 8 (eight) hours as needed for nausea or vomiting.   Yes Historical Provider, MD  polyethylene glycol (MIRALAX / GLYCOLAX) packet Take 17 g by mouth daily.   Yes Historical Provider, MD  sevelamer carbonate (RENVELA) 800 MG tablet Take 2,400 mg by mouth 3 (three) times daily with meals.    Yes Historical Provider, MD  traZODone (DESYREL) 50 MG tablet Take 50 mg by mouth at bedtime as needed.  09/12/13  Yes Historical Provider, MD  zolpidem (AMBIEN) 10 MG tablet Take 10 mg by mouth at bedtime as needed for sleep.   Yes Historical Provider, MD   Current Facility-Administered Medications  Medication Dose Route Frequency Provider Last Rate Last Dose  . 0.9 %  sodium chloride infusion   Intravenous Continuous Armandina Gemma, MD 50 mL/hr at 10/19/14 1009 50 mL/hr at 10/19/14 1009  . HYDROmorphone (DILAUDID) 1 MG/ML injection           .  HYDROmorphone (DILAUDID) injection 0.25-0.5 mg  0.25-0.5 mg Intravenous Q5 min PRN Arabella Merles, MD   0.25 mg at 10/19/14 1051  . insulin aspart (novoLOG) injection 0-9 Units  0-9 Units Subcutaneous TID WC Armandina Gemma, MD       Labs: Basic Metabolic Panel:  Recent Labs Lab 10/19/14 0632  NA 138  K 3.9  GLUCOSE 87   Liver Function Tests: No results for input(s): AST, ALT, ALKPHOS, BILITOT, PROT, ALBUMIN in the last 168 hours. No results for input(s): LIPASE, AMYLASE in the last 168 hours. No results for  input(s): AMMONIA in the last 168 hours. CBC:  Recent Labs Lab 10/19/14 0632  HGB 10.9*  HCT 32.0*   Cardiac Enzymes: No results for input(s): CKTOTAL, CKMB, CKMBINDEX, TROPONINI in the last 168 hours. CBG:  Recent Labs Lab 10/19/14 0559 10/19/14 0947  GLUCAP 83 100*   Iron Studies: No results for input(s): IRON, TIBC, TRANSFERRIN, FERRITIN in the last 72 hours. Studies/Results: No results found.   Review of Systems: Unable to fully obtain, drowsy in PACU Reports neck pain and sob Shakes her head No to chest pain, fever, chills, abd pain, n/v, LE edema   Physical Exam: Filed Vitals:   10/19/14 1028 10/19/14 1030 10/19/14 1042 10/19/14 1045  BP: 144/50  143/51   Pulse: 82 82 79 78  Temp:      TempSrc:      Resp:      Weight:      SpO2: 95% 95% 94% 94%     General: Well developed, well nourished, in no acute distress. Drowsy, opens eyes and responds by shaking head due to pain Head: Normocephalic, atraumatic, sclera non-icteric, mucus membranes are moist Neck: Supple. JVD not elevated. Transverse anterior neck incision Lungs: Clear anteriorly only, shallow, unlabored Heart: RRR with 2/6 systolic murmur  Abdomen: Soft, non-tender, non-distended with normoactive bowel sounds. No rebound/guarding. No obvious abdominal masses. M-S:  Strength and tone appear normal for age. Lower extremities: trace LE edema Neuro: Alert and oriented X 3. Moves all  extremities spontaneously. Psych:  Responds to questions appropriately with a normal affect. Dialysis Access: L aVF +b/t   Dialysis Orders:  MWF [place]  3:30   69.5kgs   2K/2.25Ca   L AVF heparin 2000 u  Profile 2 micera 50 q 4 weeks (last 2/24) venofer q week (Weds) hectorol 1 q hd   Assessment/Plan: 1.  Enlarged thryoid/thydroid nodules- previous biopsy suspicious for neoplasm. S/p total thyroidectomy 3/3 by Dr Harlow Asa. Await pathology results 2.  ESRD -  MWF [place], HD tomorrow.  3.  Hypertension/volume  - 138/50, edw recently lowered 0.5kgs and still leaving below, needs to be lowered again. Home meds- lisinopril 40 q day 4.  Anemia  - hgb 10.9, recieved micera 2/24 (sheduled q 4 weeks) 5.  Metabolic bone disease -  Cont hectorol and renvela- when diet advanced 6.  Nutrition - NPO 7. afib- amiodarone and eliquis 8. DM- per primary 9. dispo- DC tomorrow after HD  Shelle Iron, NP [facility] [phone] 10/19/2014, 11:02 AM

## 2014-10-19 NOTE — Transfer of Care (Signed)
Immediate Anesthesia Transfer of Care Note  Patient: Anita [name]  Procedure(s) Performed: Procedure(s): TOTAL THYROIDECTOMY (N/A)  Patient Location: PACU  Anesthesia Type:General  Level of Consciousness: awake and patient cooperative  Airway & Oxygen Therapy: Patient Spontanous Breathing and Patient connected to face mask oxygen  Post-op Assessment: Report given to RN, Post -op Vital signs reviewed and stable and Patient moving all extremities  Post vital signs: Reviewed and stable  Last Vitals:  Filed Vitals:   10/19/14 0945  BP:   Pulse:   Temp: 36.4 C  Resp:     Complications: No apparent anesthesia complications

## 2014-10-19 NOTE — Interval H&P Note (Signed)
History and Physical Interval Note:  10/19/2014 7:21 AM  Anita [name]  has presented today for surgery, with the diagnosis of THYROID NEOPLASM OF UNCERTAIN BEHAVIOR.  The various methods of treatment have been discussed with the patient and family. After consideration of risks, benefits and other options for treatment, the patient has consented to    Procedure(s): TOTAL THYROIDECTOMY (N/A) as a surgical intervention .    The patient's history has been reviewed, patient examined, no change in status, stable for surgery.  I have reviewed the patient's chart and labs.  Questions were answered to the patient's satisfaction.    Earnstine Regal, MD, [facility] Office: [place]

## 2014-10-19 NOTE — Anesthesia Preprocedure Evaluation (Addendum)
Anesthesia Evaluation  Patient identified by MRN, date of birth, ID band Patient awake    Reviewed: Allergy & Precautions, H&P , NPO status , Patient's Chart, lab work & pertinent test results  History of Anesthesia Complications (+) PONV  Airway Mallampati: II  TM Distance: >3 FB Neck ROM: Full    Dental no notable dental hx. (+) Poor Dentition, Dental Advisory Given   Pulmonary sleep apnea , COPDCurrent Smoker,  breath sounds clear to auscultation  Pulmonary exam normal       Cardiovascular hypertension, Pt. on medications + CAD, + Peripheral Vascular Disease and +CHF Rhythm:Regular Rate:Normal + Systolic murmurs    Neuro/Psych negative neurological ROS  negative psych ROS   GI/Hepatic Neg liver ROS, GERD-  Medicated and Controlled,  Endo/Other  diabetes  Renal/GU Dialysis and ESRFRenal disease  negative genitourinary   Musculoskeletal   Abdominal   Peds  Hematology negative hematology ROS (+)   Anesthesia Other Findings   Reproductive/Obstetrics negative OB ROS                            Anesthesia Physical Anesthesia Plan  ASA: III  Anesthesia Plan: General   Post-op Pain Management:    Induction: Intravenous  Airway Management Planned: Oral ETT  Additional Equipment: Arterial line  Intra-op Plan:   Post-operative Plan: Extubation in OR  Informed Consent: I have reviewed the patients History and Physical, chart, labs and discussed the procedure including the risks, benefits and alternatives for the proposed anesthesia with the patient or authorized representative who has indicated his/her understanding and acceptance.   Dental advisory given  Plan Discussed with: CRNA  Anesthesia Plan Comments:        Anesthesia Quick Evaluation

## 2014-10-19 NOTE — Anesthesia Postprocedure Evaluation (Signed)
  Anesthesia Post-op Note  Patient: Anita [name]  Procedure(s) Performed: Procedure(s): TOTAL THYROIDECTOMY (N/A)  Patient Location: PACU  Anesthesia Type:General  Level of Consciousness: awake and alert   Airway and Oxygen Therapy: Patient Spontanous Breathing  Post-op Pain: mild  Post-op Assessment: Post-op Vital signs reviewed, Patient's Cardiovascular Status Stable and Respiratory Function Stable  Post-op Vital Signs: Reviewed  Filed Vitals:   10/19/14 1045  BP:   Pulse: 78  Temp:   Resp:     Complications: No apparent anesthesia complications

## 2014-10-20 ENCOUNTER — Encounter (HOSPITAL_COMMUNITY): Payer: Self-pay | Admitting: Surgery

## 2014-10-20 DIAGNOSIS — C73 Malignant neoplasm of thyroid gland: Secondary | ICD-10-CM | POA: Diagnosis not present

## 2014-10-20 LAB — CBC
HEMATOCRIT: 30.5 % — AB (ref 36.0–46.0)
HEMOGLOBIN: 9.8 g/dL — AB (ref 12.0–15.0)
MCH: 28.7 pg (ref 26.0–34.0)
MCHC: 32.1 g/dL (ref 30.0–36.0)
MCV: 89.4 fL (ref 78.0–100.0)
Platelets: 140 10*3/uL — ABNORMAL LOW (ref 150–400)
RBC: 3.41 MIL/uL — ABNORMAL LOW (ref 3.87–5.11)
RDW: 18.5 % — AB (ref 11.5–15.5)
WBC: 6.2 10*3/uL (ref 4.0–10.5)

## 2014-10-20 LAB — RENAL FUNCTION PANEL
Albumin: 2.5 g/dL — ABNORMAL LOW (ref 3.5–5.2)
Anion gap: 9 (ref 5–15)
BUN: 24 mg/dL — ABNORMAL HIGH (ref 6–23)
CO2: 28 mmol/L (ref 19–32)
CREATININE: 4.1 mg/dL — AB (ref 0.50–1.10)
Calcium: 7.7 mg/dL — ABNORMAL LOW (ref 8.4–10.5)
Chloride: 98 mmol/L (ref 96–112)
GFR calc Af Amer: 12 mL/min — ABNORMAL LOW (ref >90)
GFR, EST NON AFRICAN AMERICAN: 11 mL/min — AB (ref >90)
Glucose, Bld: 157 mg/dL — ABNORMAL HIGH (ref 70–99)
Phosphorus: 5.7 mg/dL — ABNORMAL HIGH (ref 2.3–4.6)
Potassium: 4.5 mmol/L (ref 3.5–5.1)
SODIUM: 135 mmol/L (ref 135–145)

## 2014-10-20 LAB — GLUCOSE, CAPILLARY: Glucose-Capillary: 160 mg/dL — ABNORMAL HIGH (ref 70–99)

## 2014-10-20 MED ORDER — OXYCODONE HCL 5 MG PO TABS: 5.0000 mg | ORAL_TABLET | Freq: Four times a day (QID) | ORAL | Status: DC | PRN

## 2014-10-20 MED ORDER — DOXERCALCIFEROL 4 MCG/2ML IV SOLN
INTRAVENOUS | Status: AC
  Administered 2014-10-20: 1 ug
  Filled 2014-10-20: qty 2

## 2014-10-20 MED ORDER — HYDROMORPHONE HCL 1 MG/ML IJ SOLN
INTRAMUSCULAR | Status: AC
  Administered 2014-10-20: 1 mg
  Filled 2014-10-20: qty 1

## 2014-10-20 MED ORDER — LEVOTHYROXINE SODIUM 88 MCG PO TABS: 88.0000 ug | ORAL_TABLET | Freq: Every day | ORAL | Status: DC

## 2014-10-20 NOTE — Progress Notes (Signed)
Subjective: Interval History: has complaints pain at op site, wants lower dry.  Objective: Vital signs in last 24 hours: Temp:  [96.8 F (36 C)-98.4 F (36.9 C)] 96.8 F (36 C) (03/04 0655) Pulse Rate:  [66-86] 72 (03/04 0930) Resp:  [12-18] 15 (03/04 0700) BP: (124-172)/(50-70) 169/64 mmHg (03/04 0930) SpO2:  [91 %-99 %] 95 % (03/04 0930) Arterial Line BP: (147-181)/(45-94) 175/59 mmHg (03/03 1400) Weight:  [71.1 kg (156 lb 12 oz)] 71.1 kg (156 lb 12 oz) (03/04 0655) Weight change: 0.792 kg (1 lb 12 oz)  Intake/Output from previous day: 03/03 0701 - 03/04 0700 In: 880 [P.O.:580; I.V.:300] Out: -  Intake/Output this shift:    General appearance: cooperative, pale and slowed mentation Resp: diminished breath sounds bilaterally and rhonchi bilaterally Cardio: S1, S2 normal and systolic murmur: systolic ejection 2/6, decrescendo at 2nd left intercostal space GI: pos bs,soft. nontender Extremities: avf  Lab Results:  Recent Labs  10/19/14 0632 10/20/14 0714  WBC  --  6.2  HGB 10.9* 9.8*  HCT 32.0* 30.5*  PLT  --  140*   BMET:  Recent Labs  10/19/14 0632 10/20/14 0715  NA 138 135  K 3.9 4.5  CL  --  98  CO2  --  28  GLUCOSE 87 157*  BUN  --  24*  CREATININE  --  4.10*  CALCIUM  --  7.7*   No results for input(s): PTH in the last 72 hours. Iron Studies: No results for input(s): IRON, TIBC, TRANSFERRIN, FERRITIN in the last 72 hours.  Studies/Results: No results found.  I have reviewed the patient's current medications. Prior to Admission:  Prescriptions prior to admission  Medication Sig Dispense Refill Last Dose  . ACCU-CHEK AVIVA PLUS test strip    Taking  . ALPRAZolam (XANAX) 1 MG tablet Take 0.5-1 mg by mouth 4 (four) times daily. Patient takes 1/2 tablet 2-3 times daily and 1 tablet at bedtime   10/19/2014 at 0440  . amiodarone (PACERONE) 200 MG tablet Take 200 mg by mouth at bedtime.   10/18/2014 at Unknown time  . calcium carbonate (TUMS - DOSED IN MG  ELEMENTAL CALCIUM) 500 MG chewable tablet Chew 1 tablet by mouth 2 (two) times daily as needed for heartburn (Takes with snacks. In place of Renvela).   10/18/2014 at Unknown time  . Cyanocobalamin (VITAMIN B 12 PO) Take 1,000 mg by mouth daily.   Past Week at Unknown time  . diltiazem (TIAZAC) 180 MG 24 hr capsule Take 1 capsule (180 mg total) by mouth at bedtime. 90 capsule 3 10/18/2014 at Unknown time  . ELIQUIS 5 MG TABS tablet Take 5 mg by mouth 2 (two) times daily.    10/14/2014  . hydrALAZINE (APRESOLINE) 50 MG tablet Take 1 tablet (50 mg total) by mouth 3 (three) times daily. 270 tablet 3 10/19/2014 at 0440  . HYDROcodone-acetaminophen (NORCO) 7.5-325 MG per tablet Take 1 tablet by mouth every 6 (six) hours as needed for pain. 30 tablet 0 10/19/2014 at 0330  . lisinopril (PRINIVIL,ZESTRIL) 40 MG tablet Take 40 mg by mouth 2 (two) times daily.   10/18/2014 at Unknown time  . meclizine (ANTIVERT) 25 MG tablet Take 25 mg by mouth 4 (four) times daily as needed for dizziness.   Past Week at Unknown time  . multivitamin (RENA-VIT) TABS tablet Take 1 tablet by mouth at bedtime. 30 tablet 3 10/18/2014 at Unknown time  . omeprazole (PRILOSEC) 20 MG capsule Take 20 mg by mouth daily  as needed (acid reflux).    Past Week at Unknown time  . ondansetron (ZOFRAN-ODT) 4 MG disintegrating tablet Take 4 mg by mouth every 8 (eight) hours as needed for nausea or vomiting.   Past Week at Unknown time  . polyethylene glycol (MIRALAX / GLYCOLAX) packet Take 17 g by mouth daily.   Past Week at Unknown time  . sevelamer carbonate (RENVELA) 800 MG tablet Take 2,400 mg by mouth 3 (three) times daily with meals.    10/18/2014 at Unknown time  . traZODone (DESYREL) 50 MG tablet Take 50 mg by mouth at bedtime as needed.    Past Week at Unknown time  . zolpidem (AMBIEN) 10 MG tablet Take 10 mg by mouth at bedtime as needed for sleep.   10/18/2014 at Unknown time    Assessment/Plan: 1 ESRD lower dry at [facility] 2 HTN as above,stop hydral 3  Anemia stable 4 CAD 5 DM controlled 6 AS 7 HPTH 8 Thyroid nodules post thyroidectomy P HD, epo, lower dry, stop hydral    LOS: 1 day   Khian Remo L 10/20/2014,10:09 AM

## 2014-10-20 NOTE — Procedures (Signed)
I was present at this session.  I have reviewed the session itself and made appropriate changes.  HD via avf.  bp ^, needs lower dry.  Will plan as outpatient  Anita [name] L 3/4/201610:08 AM

## 2014-10-20 NOTE — Progress Notes (Signed)
UR completed 

## 2014-10-20 NOTE — Progress Notes (Signed)
Pt discharge instructions given, pt verbalized understanding.  VSS. Denies pain.  Pt left floor via wheelchair accompanied by staff and family. 

## 2014-10-20 NOTE — Discharge Summary (Signed)
Physician Discharge Summary [facility]  Patient ID: Anita [name] MRN: [id number] DOB/AGE: 11-28-1950 64 y.o.  Admit date: 10/19/2014 Discharge date: 10/20/2014  Admission Diagnoses:  Thyroid neoplasm of uncertain behavior  Discharge Diagnoses:  Principal Problem:   Neoplasm of uncertain behavior of thyroid gland   Discharged Condition: good  Hospital Course: Patient was admitted for observation following thyroid surgery.  Post op course was uncomplicated.  Pain was well controlled.  Tolerated diet.  Post op calcium level on morning following surgery was 7.7 mg/dl.  Patient had HD on morning following surgery.  Patient was prepared for discharge home on POD#1.  Consults: nephrology  Treatments: surgery: total thyroidectomy   Discharge Exam: Blood pressure 150/70, pulse 69, temperature 96.8 F (36 C), temperature source Axillary, resp. rate 15, weight 156 lb 12 oz (71.1 kg), SpO2 91 %. HEENT - clear Neck - incision dry and intact; moderate ecchymosis, no hematoma Chest - clear bilaterally Cor - RRR  Disposition: Home  Discharge Instructions    Diet - low sodium heart healthy    Complete by:  As directed      Discharge instructions    Complete by:  As directed   [facility]  THYROID & PARATHYROID SURGERY:  POST-OP INSTRUCTIONS  Always review your discharge instruction sheet from the facility where your surgery was performed.  A prescription for pain medication may be given to you upon discharge.  Take your pain medication as prescribed.  If narcotic pain medicine is not needed, then you may take acetaminophen (Tylenol) or ibuprofen (Advil) as needed.  Take your usually prescribed medications unless otherwise directed.  If you need a refill on your pain medication, please contact your pharmacy. They will contact our office to request authorization.  Prescriptions will not be processed after 5 pm or on weekends.  Start with a light  diet upon arrival home, such as soup and crackers or toast.  Be sure to drink plenty of fluids daily.  Resume your normal diet the day after surgery.  Most patients will experience some swelling and bruising on the chest and neck area.  Ice packs will help.  Swelling and bruising can take several days to resolve.   It is common to experience some constipation after surgery.  Increasing fluid intake and taking a stool softener will usually help or prevent this problem.  A mild laxative (Milk of Magnesia or Miralax) should be taken according to package directions if there has been no bowel movement after 48 hours.  If you have steri-strips and a gauze dressing over your incision, you may remove the gauze bandage on the second day after surgery, and you may shower at that time.  Leave your steri-strips (small skin tapes) in place directly over the incision.  These strips should remain on the skin for 7-10 days and then be removed.  You may get them wet in the shower and pat them dry.  If you have Dermabond (topical glue) over your incision, you may shower at any time following your procedure.  Leave the glue in place for 10-14 days.  When it begins to flake off around the edges, you may remove it.  You may resume regular (light) daily activities beginning the next day-such as daily self-care, walking, climbing stairs-gradually increasing activities as tolerated.  You may have sexual intercourse when it is comfortable.  Refrain from any heavy lifting or straining until approved by your doctor.  You may drive when you  no longer are taking prescription pain medication, you can comfortably wear a seatbelt, and you can safely maneuver your car and apply brakes.  You should see your doctor in the office for a follow-up appointment approximately two to three weeks after your surgery.  Make sure that you call for this appointment within a day or two after you arrive home to insure a convenient appointment  time.  WHEN TO CALL YOUR DOCTOR: -- Fever greater than 101.5 -- Inability to urinate -- Nausea and/or vomiting - persistent -- Extreme swelling or bruising -- Continued bleeding from incision -- Increased pain, redness, or drainage from the incision -- Difficulty swallowing or breathing -- Muscle cramping or spasms -- Numbness or tingling in hands or around lips  The clinic staff is available to answer your questions during regular business hours.  Please don't hesitate to call and ask to speak to one of the nurses if you have concerns.  Earnstine Regal, MD, [facility] Office: [phone]  Website: [email or web address]     Increase activity slowly    Complete by:  As directed      No dressing needed    Complete by:  As directed             Medication List    TAKE these medications        ACCU-CHEK AVIVA PLUS test strip  Generic drug:  glucose blood     ALPRAZolam 1 MG tablet  Commonly known as:  XANAX  Take 0.5-1 mg by mouth 4 (four) times daily. Patient takes 1/2 tablet 2-3 times daily and 1 tablet at bedtime     amiodarone 200 MG tablet  Commonly known as:  PACERONE  Take 200 mg by mouth at bedtime.     calcium carbonate 500 MG chewable tablet  Commonly known as:  TUMS - dosed in mg elemental calcium  Chew 1 tablet by mouth 2 (two) times daily as needed for heartburn (Takes with snacks. In place of Renvela).     diltiazem 180 MG 24 hr capsule  Commonly known as:  TIAZAC  Take 1 capsule (180 mg total) by mouth at bedtime.     ELIQUIS 5 MG Tabs tablet  Generic drug:  apixaban  Take 5 mg by mouth 2 (two) times daily.     hydrALAZINE 50 MG tablet  Commonly known as:  APRESOLINE  Take 1 tablet (50 mg total) by mouth 3 (three) times daily.     HYDROcodone-acetaminophen 7.5-325 MG per tablet  Commonly known as:  NORCO  Take 1 tablet by mouth every 6 (six) hours as needed for pain.     lisinopril  40 MG tablet  Commonly known as:  PRINIVIL,ZESTRIL  Take 40 mg by mouth 2 (two) times daily.     meclizine 25 MG tablet  Commonly known as:  ANTIVERT  Take 25 mg by mouth 4 (four) times daily as needed for dizziness.     multivitamin Tabs tablet  Take 1 tablet by mouth at bedtime.     omeprazole 20 MG capsule  Commonly known as:  PRILOSEC  Take 20 mg by mouth daily as needed (acid reflux).     ondansetron 4 MG disintegrating tablet  Commonly known as:  ZOFRAN-ODT  Take 4 mg by mouth every 8 (eight) hours as needed for nausea or vomiting.     oxyCODONE 5 MG immediate release tablet  Commonly known as:  Oxy IR/ROXICODONE  Take  1-2 tablets (5-10 mg total) by mouth every 6 (six) hours as needed for moderate pain.     polyethylene glycol packet  Commonly known as:  MIRALAX / GLYCOLAX  Take 17 g by mouth daily.     sevelamer carbonate 800 MG tablet  Commonly known as:  RENVELA  Take 2,400 mg by mouth 3 (three) times daily with meals.     traZODone 50 MG tablet  Commonly known as:  DESYREL  Take 50 mg by mouth at bedtime as needed.     VITAMIN B 12 PO  Take 1,000 mg by mouth daily.     zolpidem 10 MG tablet  Commonly known as:  AMBIEN  Take 10 mg by mouth at bedtime as needed for sleep.         Earnstine Regal, MD, [facility] Office: [phone]   Signed: Earnstine Regal 10/20/2014, 9:36 AM

## 2014-12-07 ENCOUNTER — Other Ambulatory Visit (HOSPITAL_COMMUNITY): Payer: Self-pay | Admitting: Endocrinology

## 2014-12-07 DIAGNOSIS — C73 Malignant neoplasm of thyroid gland: Secondary | ICD-10-CM

## 2014-12-25 ENCOUNTER — Encounter (HOSPITAL_COMMUNITY)
Admission: RE | Admit: 2014-12-25 | Discharge: 2014-12-25 | Disposition: A | Discharge status: Home / Self Care | Payer: Medicare Other | Source: Ambulatory Visit | Attending: Endocrinology | Admitting: Endocrinology

## 2014-12-26 ENCOUNTER — Encounter (HOSPITAL_COMMUNITY): Payer: Medicare Other

## 2014-12-27 ENCOUNTER — Encounter (HOSPITAL_COMMUNITY): Payer: Medicare Other

## 2015-01-03 ENCOUNTER — Encounter (HOSPITAL_COMMUNITY): Payer: Medicare Other

## 2015-01-16 ENCOUNTER — Telehealth: Payer: Self-pay | Admitting: Cardiology

## 2015-01-16 NOTE — Telephone Encounter (Signed)
-----   Message from Arnoldo Lenis, MD sent at 01/16/2015 12:59 PM EDT ----- Please let patient know I talked with Dr Koleen Nimrod today, in order to treat her thyroid problems she needs to be off amiodarone. Please have her stop amio and continue her other medications  Zandra Abts MD

## 2015-01-16 NOTE — Telephone Encounter (Signed)
Pt aware to stop amiodarone

## 2015-03-06 IMAGING — CT CT HEAD W/O CM
4 of 5 series · 15 of 47 positions shown, 16 images · non-contrast
Comparison: None.

CLINICAL DATA: Fall

EXAM:
CT HEAD WITHOUT CONTRAST
CT CERVICAL SPINE WITHOUT CONTRAST
TECHNIQUE: Multidetector CT imaging of the head and cervical spine was
performed following the standard protocol without intravenous
contrast. Multiplanar CT image reconstructions of the cervical spine
were also generated.

[Series 2: head 5.0 h30s · axial · 0.40mm/px · z∈[-174,-94]mm · 3 of 32 slices shown, 4 images]
[im 8/32  brain]
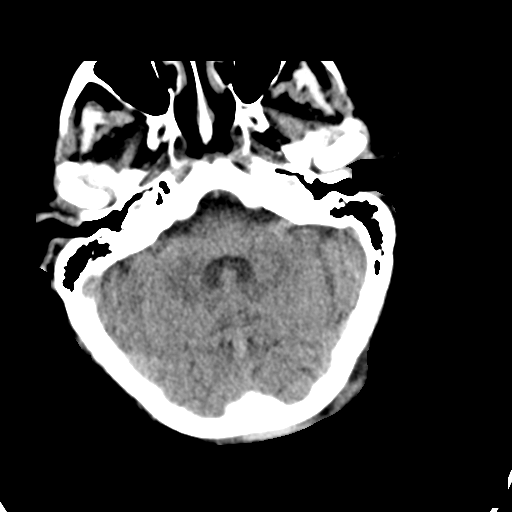
[im 8/32  bone]
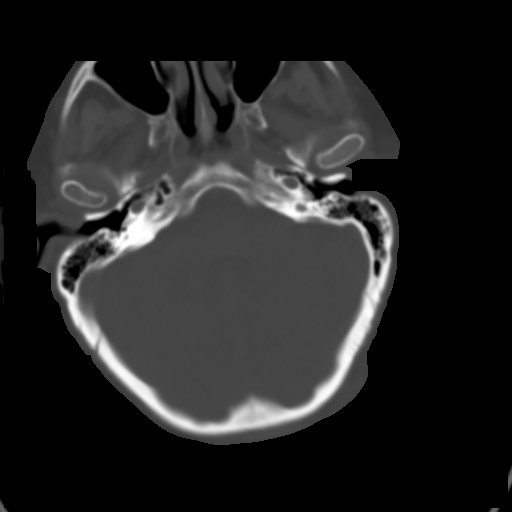
[im 16/32  brain]
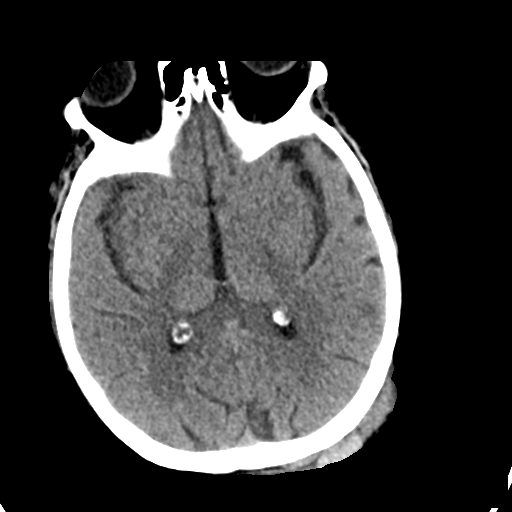
[im 24/32  brain]
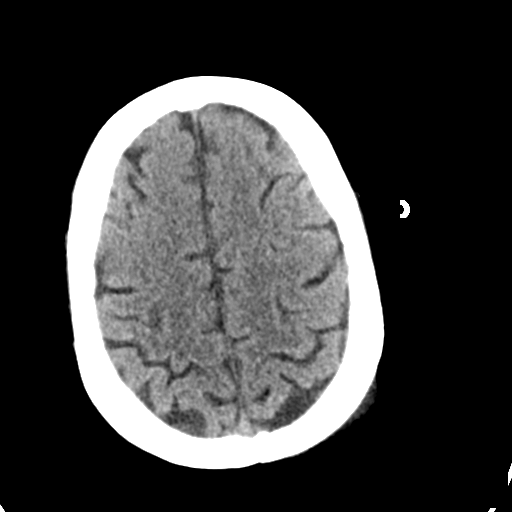

[Series 7: coronals · coronal · 0.31mm/px · 3 of 42 slices shown]
[im 14/42  brain]
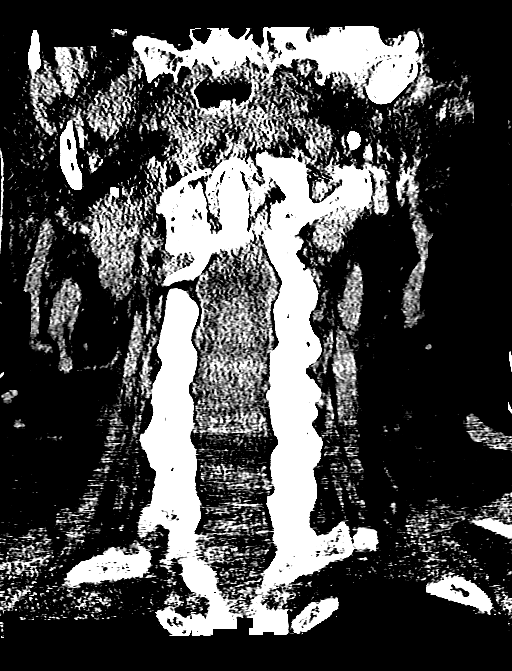
[im 19/42  brain]
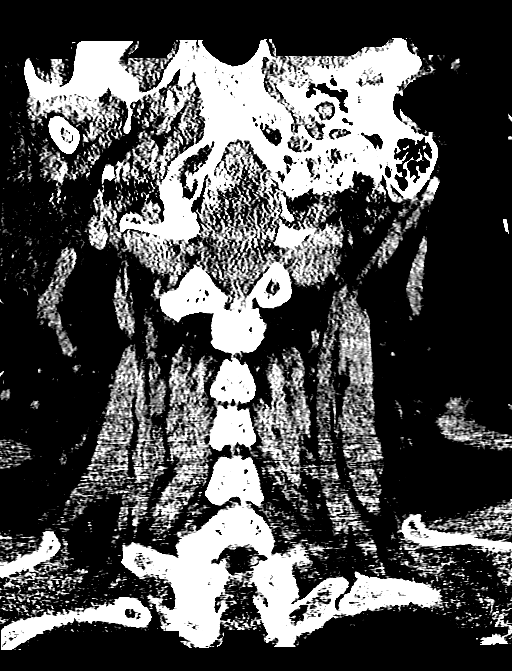
[im 23/42  brain]
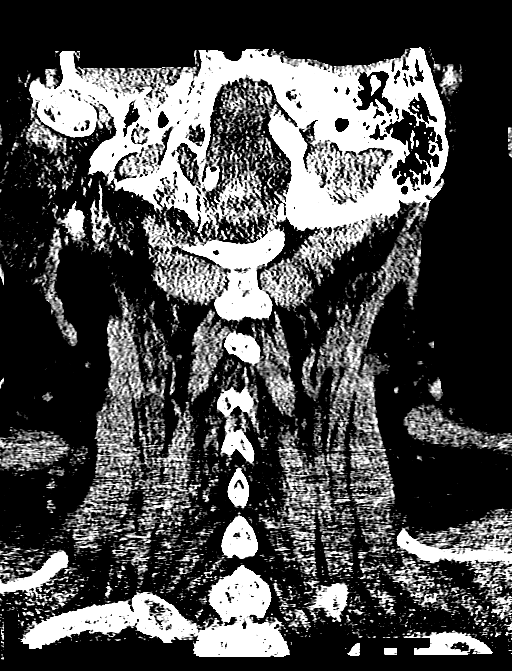

[Series 8: sagittals · sagittal · 0.26mm/px · 3 of 34 slices shown]
[im 12/34  brain]
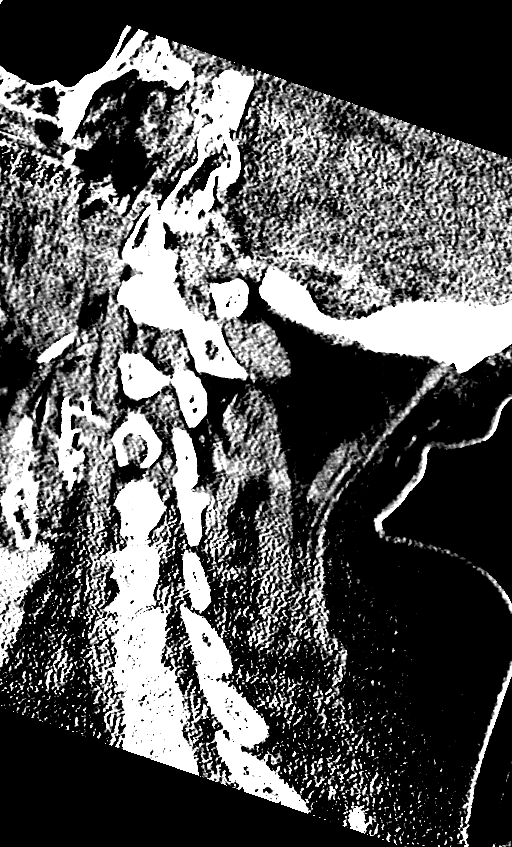
[im 17/34  brain]
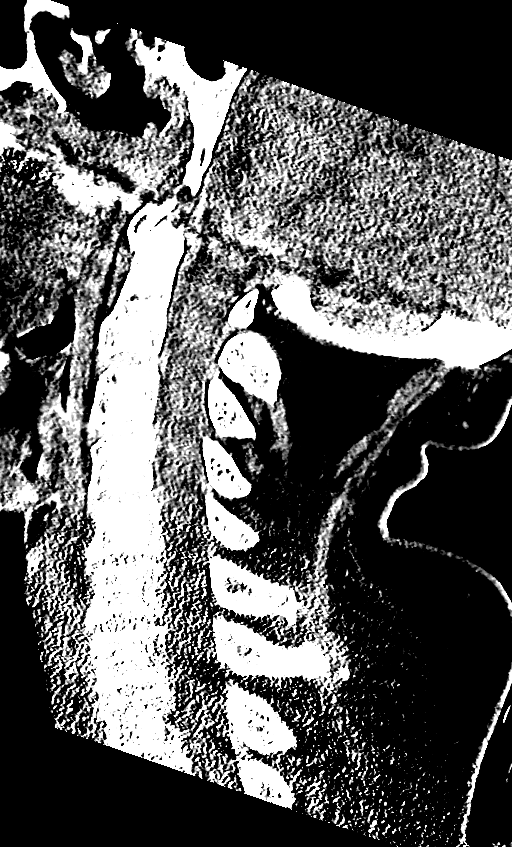
[im 23/34  brain]
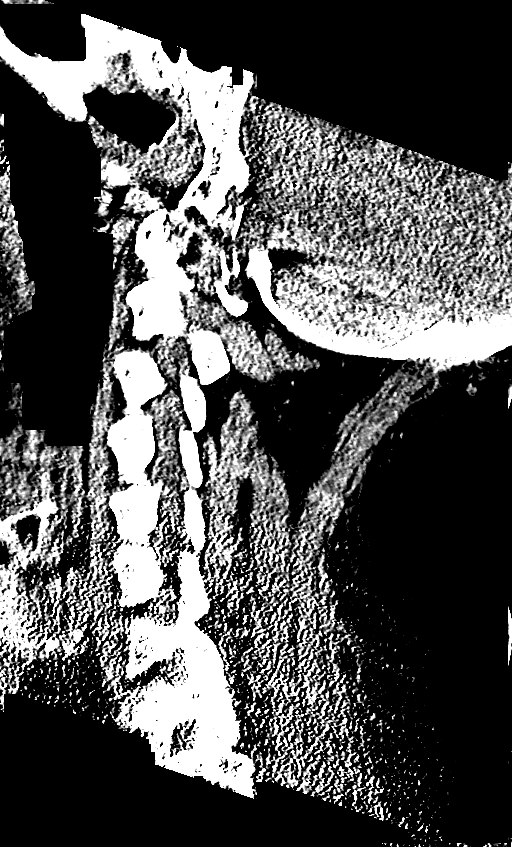

[Series 9: orthogonals · axial · 0.23mm/px · z∈[-333,-253]mm · 6 of 70 slices shown]
[im 7/70  brain]
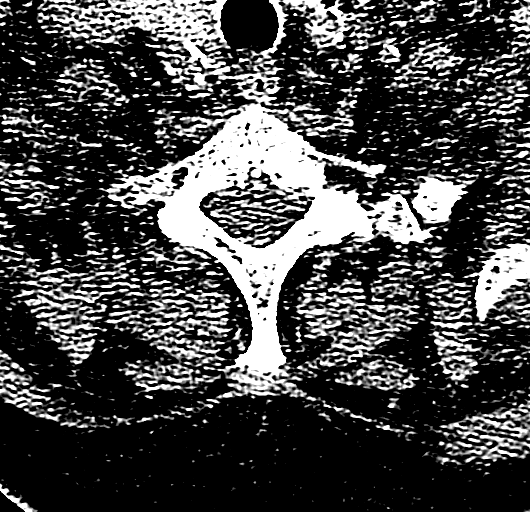
[im 14/70  brain]
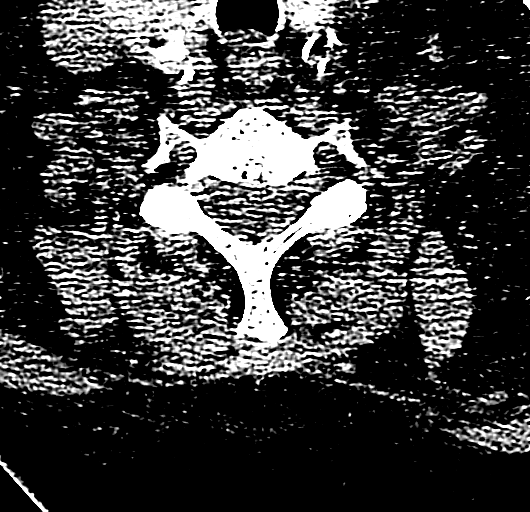
[im 21/70  brain]
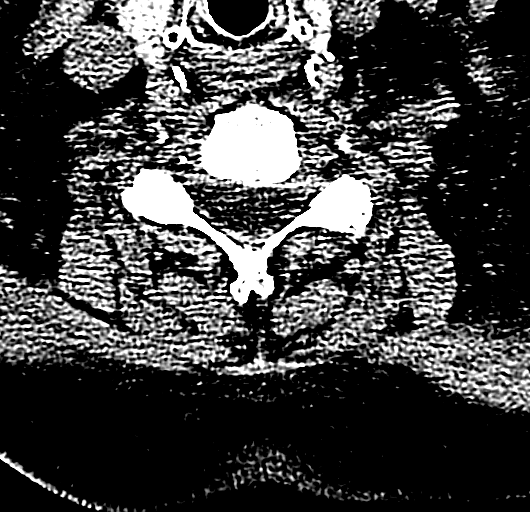
[im 28/70  brain]
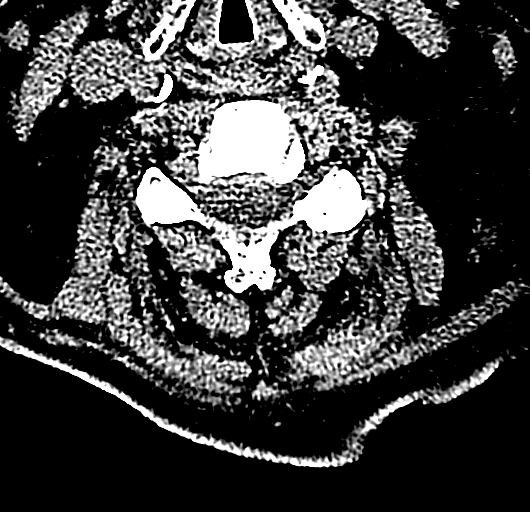
[im 42/70  brain]
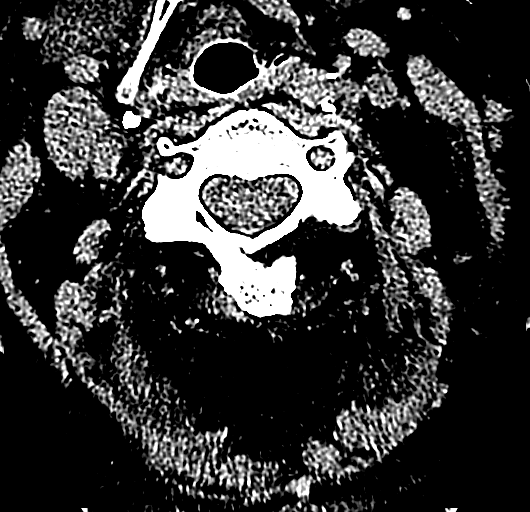
[im 49/70  brain]
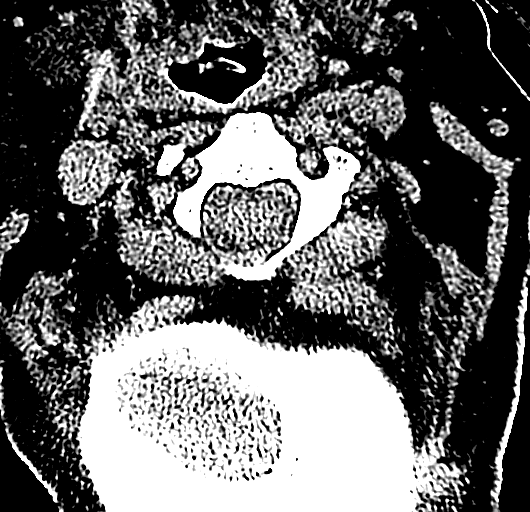

[15 of 47 positions shown; findings below may reference images not displayed]

FINDINGS: CT HEAD FINDINGS

No skull fracture is noted. Paranasal sinuses and mastoid air cells
are unremarkable. There is skull swelling and subcutaneous stranding
in left occipital region. Subcutaneous hematoma in this region
measures about 2.3 cm.

No intracranial hemorrhage, mass effect or midline shift.
Atherosclerotic calcifications of carotid siphon are noted. Mild
cerebral atrophy is noted. Tiny lacunar infarct is noted in right
basal ganglia. No acute cortical infarction. No mass lesion is noted
on this unenhanced scan.

CT CERVICAL SPINE FINDINGS

Axial images of the cervical spine shows no acute fracture or
subluxation. Computer processed images shows no acute fracture or
subluxation. Degenerative changes are noted C1-C2 articulation. Mild
disc space flattening with anterior spurring at C5-C6 level. No
prevertebral soft tissue swelling. Cervical airway is patent. There
is no pneumothorax in visualized left lung apex.
IMPRESSION: 1. No acute intracranial abnormality. There is scalp swelling and
subcutaneous stranding in left occipital region. Subcutaneous
hematoma in left occipital region measures 2.4 cm. Mild cerebral
atrophy.
2. No cervical spine acute fracture or subluxation. Mild
degenerative changes.

## 2015-03-08 IMAGING — CR DG CHEST 2V
2 series · 2 of 2 positions shown · non-contrast
Comparison: CT and radiographs 07/12/2013.

CLINICAL DATA: Recent fall with head injury and altered level of
consciousness. Right pleural effusion.

EXAM:
CHEST  2 VIEW

[x chest ap]
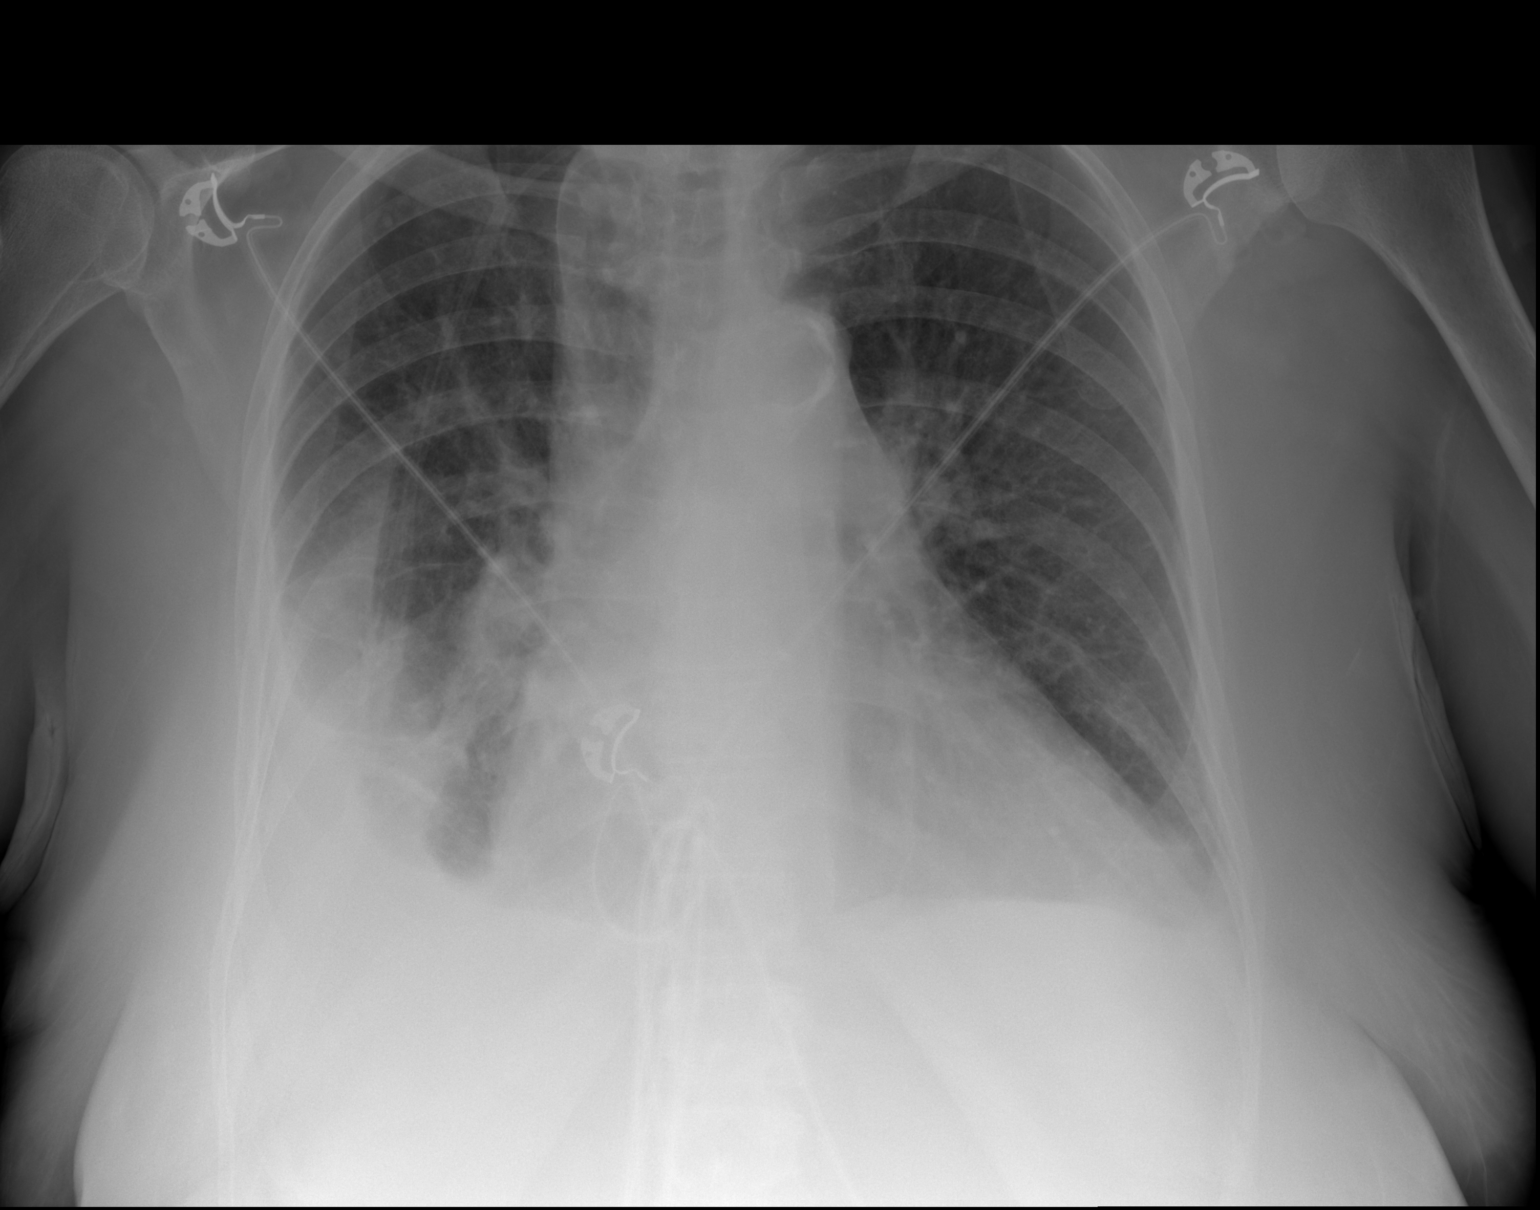

[w chest lat]
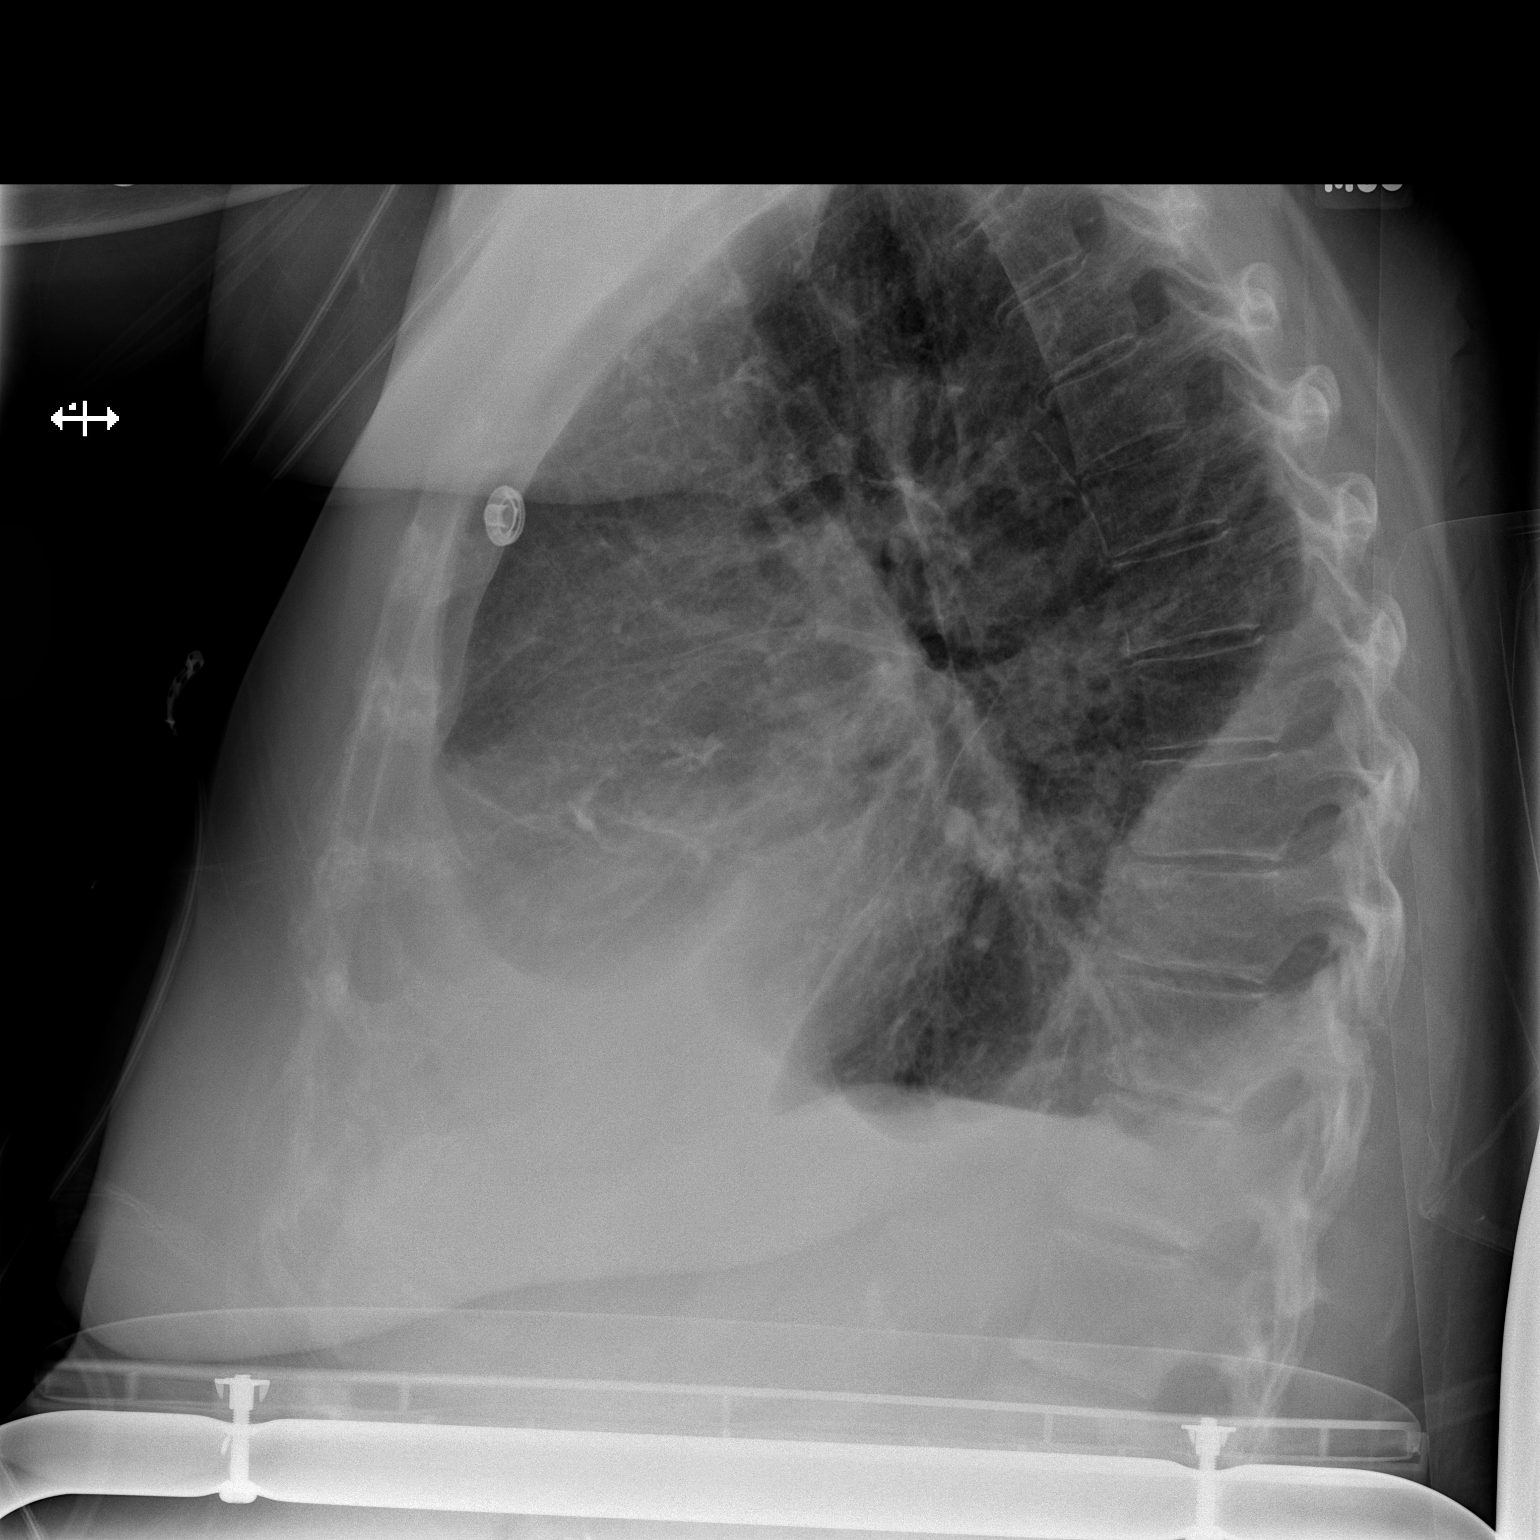

[2 of 2 positions shown; findings below may reference images not displayed]

FINDINGS: The loculated right pleural effusion does not appear significantly
changed. Adjacent right basilar airspace disease is stable. The left
lung is clear. An azygos fissure is noted. The heart size and
mediastinal contours are stable with aortic atherosclerosis
IMPRESSION: No significant change in loculated right pleural effusion and
adjacent right basilar airspace disease.

## 2015-03-13 ENCOUNTER — Encounter: Payer: Self-pay | Admitting: Cardiology

## 2015-03-13 ENCOUNTER — Ambulatory Visit (INDEPENDENT_AMBULATORY_CARE_PROVIDER_SITE_OTHER): Payer: Medicare Other | Admitting: Cardiology

## 2015-03-13 VITALS — BP 160/72 | HR 69 | Ht 66.0 in | Wt 150.0 lb

## 2015-03-13 DIAGNOSIS — I10 Essential (primary) hypertension: Secondary | ICD-10-CM | POA: Diagnosis not present

## 2015-03-13 DIAGNOSIS — I5032 Chronic diastolic (congestive) heart failure: Secondary | ICD-10-CM | POA: Diagnosis not present

## 2015-03-13 DIAGNOSIS — I4891 Unspecified atrial fibrillation: Secondary | ICD-10-CM

## 2015-03-13 DIAGNOSIS — I6523 Occlusion and stenosis of bilateral carotid arteries: Secondary | ICD-10-CM | POA: Diagnosis not present

## 2015-03-13 MED ORDER — DILTIAZEM HCL ER BEADS 240 MG PO CP24: 240.0000 mg | ORAL_CAPSULE | Freq: Every day | ORAL | Status: DC

## 2015-03-13 NOTE — Progress Notes (Signed)
Patient ID: Anita [name], female   DOB: [birth year], 64 y.o.   MRN: [id number]     Clinical Summary Anita [name] is a 64 y.o.female seen today for follow up of the following medical problems.   1. Afib  - amio recently stopped after discussion with endo regarding her thyroid issues - notes some occasional palpitations.   - on eliquis, no bleeding issues   2. HTN  - checks 160s-180s - compliant with meds. PCP recently increased hydralazine to 100mg  bid.    3. Chronic diastolic heart failure  - fluid removal by dialysis, makes very little urine  - denies any SOB, DOE, LE edema, or orthopnea  4. Non-obstructive CAD - non-obstructive disease by prior cath  - no chest pain. Does have some stable SOB, DOE   5. ESRD  - compliant with HD sessions. Now only requiring 2 sessions   6. Carotid stenosis - mild to moderate disease bilaterally, no neuro symptoms  Past Medical History  Diagnosis Date  . Neuropathy due to secondary diabetes   . Diabetic nephropathy   . Hypertension   . Anemia   . Dyslipidemia   . Laryngeal mass   . COPD (chronic obstructive pulmonary disease)   . GERD (gastroesophageal reflux disease)   . Sleep apnea     Does not use CPAP- due to weight loss  . Atrial fibrillation     Diagnosed 11/14 of undetermined age of onset    . 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 CAD (coronary artery disease) 07/13/2013    Catheterization 5 years ago by Dr. Elisabeth Cara with reportedly nonobstructive disease Calcification noted on CT scan of chest in November of 2014   . ESRD on hemodialysis   . Chronic diastolic heart failure   . Aortic stenosis 12/23/2012  . 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 Atherosclerosis of aorta   . PONV (postoperative nausea and vomiting)   . Shortness of breath dyspnea     with exertion  . Wears glasses   . CHF (congestive heart failure)   . Thyroid neoplasm     of uncertain behavior  . Diabetic neuropathy      Allergies    Allergen Reactions  . Doxycycline Nausea And Vomiting  . Lipitor [Atorvastatin] Nausea And Vomiting     Current Outpatient Prescriptions  Medication Sig Dispense Refill  . ACCU-CHEK AVIVA PLUS test strip     . ALPRAZolam (XANAX) 1 MG tablet Take 0.5-1 mg by mouth 4 (four) times daily. Patient takes 1/2 tablet 2-3 times daily and 1 tablet at bedtime    . calcium carbonate (TUMS - DOSED IN MG ELEMENTAL CALCIUM) 500 MG chewable tablet Chew 1 tablet by mouth 2 (two) times daily as needed for heartburn (Takes with snacks. In place of Renvela).    . Cyanocobalamin (VITAMIN B 12 PO) Take 1,000 mg by mouth daily.    Marland Kitchen diltiazem (TIAZAC) 180 MG 24 hr capsule Take 1 capsule (180 mg total) by mouth at bedtime. 90 capsule 3  . ELIQUIS 5 MG TABS tablet Take 5 mg by mouth 2 (two) times daily.     . hydrALAZINE (APRESOLINE) 50 MG tablet Take 1 tablet (50 mg total) by mouth 3 (three) times daily. 270 tablet 3  . HYDROcodone-acetaminophen (NORCO) 7.5-325 MG per tablet Take 1 tablet by mouth every 6 (six) hours as needed for pain. 30 tablet 0  . levothyroxine (SYNTHROID) 88 MCG tablet Take 1 tablet (88 mcg total)  by mouth daily before breakfast. 30 tablet 3  . lisinopril (PRINIVIL,ZESTRIL) 40 MG tablet Take 40 mg by mouth 2 (two) times daily.    . meclizine (ANTIVERT) 25 MG tablet Take 25 mg by mouth 4 (four) times daily as needed for dizziness.    . multivitamin (RENA-VIT) TABS tablet Take 1 tablet by mouth at bedtime. 30 tablet 3  . omeprazole (PRILOSEC) 20 MG capsule Take 20 mg by mouth daily as needed (acid reflux).     . ondansetron (ZOFRAN-ODT) 4 MG disintegrating tablet Take 4 mg by mouth every 8 (eight) hours as needed for nausea or vomiting.    Marland Kitchen oxyCODONE (OXY IR/ROXICODONE) 5 MG immediate release tablet Take 1-2 tablets (5-10 mg total) by mouth every 6 (six) hours as needed for moderate pain. 30 tablet 0  . polyethylene glycol (MIRALAX / GLYCOLAX) packet Take 17 g by mouth daily.    . sevelamer  carbonate (RENVELA) 800 MG tablet Take 2,400 mg by mouth 3 (three) times daily with meals.     . traZODone (DESYREL) 50 MG tablet Take 50 mg by mouth at bedtime as needed.     . zolpidem (AMBIEN) 10 MG tablet Take 10 mg by mouth at bedtime as needed for sleep.     No current facility-administered medications for this visit.     Past Surgical History  Procedure Laterality Date  . Cholecystectomy    . Appendectomy    . Shoulder arthroscopy Right     w/ repair of rotator cuff repair  . Elbow tendon surgery Right   . Achilles tendon repair Right   . Cesarean section      X 2   . Hemorrhoid surgery      many years ago  . Av fistula placement Left 12/29/2012    Procedure: ARTERIOVENOUS (AV) FISTULA CREATION;  Surgeon: Elam Dutch, MD;  Location: [facility] OR;  Service: Vascular;  Laterality: Left;  Creation Left Brachial Cephalic Arteriovenous Fistula  . Cardiac catheterization    . Laryngectomy      mass removed- noncancerous  . Lumbar laminectomy      lower back  . Thyroidectomy N/A 10/19/2014    Procedure: TOTAL THYROIDECTOMY;  Surgeon: Armandina Gemma, MD;  Location: [facility];  Service: General;  Laterality: N/A;     Allergies  Allergen Reactions  . Doxycycline Nausea And Vomiting  . Lipitor [Atorvastatin] Nausea And Vomiting      Family History  Problem Relation Age of Onset  . COPD Mother   . Thyroid disease Mother   . Multiple sclerosis Father   . Heart disease Father   . Heart attack Father   . Depression Brother     Suicide  . Alcohol abuse Brother   . Heart disease Brother   . Asthma Brother   . Thyroid disease Daughter   . Diabetes Maternal Aunt      Social History Anita [name] reports that she has been smoking Cigarettes and E-cigarettes.  She started smoking about 45 years ago. She has a 20 pack-year smoking history. She has never used smokeless tobacco. Anita [name] reports that she does not drink alcohol.   Review of Systems CONSTITUTIONAL: No weight loss, fever,  chills, weakness or fatigue.  HEENT: Eyes: No visual loss, blurred vision, double vision or yellow sclerae.No hearing loss, sneezing, congestion, runny nose or sore throat.  SKIN: No rash or itching.  CARDIOVASCULAR: per HPI RESPIRATORY: No cough or sputum.  GASTROINTESTINAL: No anorexia, nausea, vomiting or diarrhea.  No abdominal pain or blood.  GENITOURINARY: No burning on urination, no polyuria NEUROLOGICAL: No headache, dizziness, syncope, paralysis, ataxia, numbness or tingling in the extremities. No change in bowel or bladder control.  MUSCULOSKELETAL: No muscle, back pain, joint pain or stiffness.  LYMPHATICS: No enlarged nodes. No history of splenectomy.  PSYCHIATRIC: No history of depression or anxiety.  ENDOCRINOLOGIC: No reports of sweating, cold or heat intolerance. No polyuria or polydipsia.  Marland Kitchen   Physical Examination Filed Vitals:   03/13/15 0917  BP: 160/72  Pulse: 69   Filed Vitals:   03/13/15 0917  Height: 5\' 6"  (1.676 m)  Weight: 150 lb (68.04 kg)    Gen: resting comfortably, no acute distress HEENT: no scleral icterus, pupils equal round and reactive, no palptable cervical adenopathy,  CV: RRR, no m/r/g, no JVD. Bilateral carotid bruits Resp: Clear to auscultation bilaterally GI: abdomen is soft, non-tender, non-distended, normal bowel sounds, no hepatosplenomegaly MSK: extremities are warm, no edema.  Skin: warm, no rash Neuro:  no focal deficits Psych: appropriate affect   Diagnostic Studies  06/2013 Echo  LVEF 75%, mild AI, mod LAE, mod TR, PASP 51  02/2008 Cath Normal coronaries, normal LV function  09/2013 IMPRESSION: Mild atheromatous calcifications are seen in the right carotid bulb and proximal right internal carotid artery consistent with less than 50% diameter stenosis based on ultrasound and Doppler criteria.  Moderate atherosclerotic calcifications are noted in the middle and distal portion of the left common carotid artery.  Mild atherosclerotic calcifications are noted in the left carotid bulb and proximal left internal carotid artery consistent with less than 50% diameter stenosis based on ultrasound and Doppler criteria   Assessment and Plan   1. Afib  - no current symptoms  - now off amio due to her thyroid disease, rate control with dilt and metoprolol. Notes some occasional palpitations, will increase dilt to 240mg  daily  2. HTN  - elevated. Follow on high dilt dose.    3. Chronic diastolic heart failure  - appears euvolemic today  - continue bp control, fluid removal with HD  4. Non-obstructive CAD  - non-obstructive from prior cath, no current symptoms  - continue risk factor modification   5. Carotid stenosis - repeat carotid US  F/u 6 months     Arnoldo Lenis, M.D.

## 2015-03-13 NOTE — Patient Instructions (Signed)
Your physician wants you to follow-up in: 6 months with Dr. Bryna Colander will receive a reminder letter in the mail two months in advance. If you don't receive a letter, please call our office to schedule the follow-up appointment.  Your physician has recommended you make the following change in your medication:   Increase Diltiazem to 240 mg Daily  Your physician has requested that you have a carotid duplex. This test is an ultrasound of the carotid arteries in your neck. It looks at blood flow through these arteries that supply the brain with blood. Allow one hour for this exam. There are no restrictions or special instructions.  Thank you for choosing [facility]!

## 2015-03-28 ENCOUNTER — Ambulatory Visit (HOSPITAL_COMMUNITY)
Admission: RE | Admit: 2015-03-28 | Discharge: 2015-03-28 | Disposition: A | Discharge status: Home / Self Care | Payer: Medicare Other | Source: Ambulatory Visit | Attending: Cardiology | Admitting: Cardiology

## 2015-03-28 DIAGNOSIS — I6523 Occlusion and stenosis of bilateral carotid arteries: Secondary | ICD-10-CM | POA: Diagnosis present

## 2015-03-28 DIAGNOSIS — E785 Hyperlipidemia, unspecified: Secondary | ICD-10-CM | POA: Diagnosis not present

## 2015-03-28 DIAGNOSIS — Z87891 Personal history of nicotine dependence: Secondary | ICD-10-CM | POA: Diagnosis not present

## 2015-03-28 DIAGNOSIS — H539 Unspecified visual disturbance: Secondary | ICD-10-CM | POA: Diagnosis not present

## 2015-03-28 DIAGNOSIS — I2510 Atherosclerotic heart disease of native coronary artery without angina pectoris: Secondary | ICD-10-CM | POA: Insufficient documentation

## 2015-03-28 DIAGNOSIS — R55 Syncope and collapse: Secondary | ICD-10-CM | POA: Insufficient documentation

## 2015-03-28 DIAGNOSIS — E119 Type 2 diabetes mellitus without complications: Secondary | ICD-10-CM | POA: Diagnosis not present

## 2015-03-28 DIAGNOSIS — I10 Essential (primary) hypertension: Secondary | ICD-10-CM | POA: Insufficient documentation

## 2015-08-28 ENCOUNTER — Encounter (HOSPITAL_COMMUNITY): Payer: Self-pay | Admitting: Emergency Medicine

## 2015-08-28 ENCOUNTER — Ambulatory Visit (HOSPITAL_COMMUNITY)
Admission: RE | Admit: 2015-08-28 | Discharge: 2015-08-28 | Disposition: A | Discharge status: Home / Self Care | Payer: Medicare Other | Source: Ambulatory Visit | Attending: Family Medicine | Admitting: Family Medicine

## 2015-08-28 ENCOUNTER — Inpatient Hospital Stay (HOSPITAL_COMMUNITY)
Admission: EM | Admit: 2015-08-28 | Discharge: 2015-09-03 | DRG: 190 | Disposition: A | Discharge status: Home Health | Payer: Medicare Other | Attending: Internal Medicine | Admitting: Internal Medicine

## 2015-08-28 ENCOUNTER — Other Ambulatory Visit: Payer: Self-pay

## 2015-08-28 ENCOUNTER — Emergency Department (HOSPITAL_COMMUNITY): Payer: Medicare Other

## 2015-08-28 ENCOUNTER — Other Ambulatory Visit (HOSPITAL_COMMUNITY): Payer: Self-pay | Admitting: Family Medicine

## 2015-08-28 DIAGNOSIS — R918 Other nonspecific abnormal finding of lung field: Secondary | ICD-10-CM

## 2015-08-28 DIAGNOSIS — J449 Chronic obstructive pulmonary disease, unspecified: Secondary | ICD-10-CM | POA: Insufficient documentation

## 2015-08-28 DIAGNOSIS — C73 Malignant neoplasm of thyroid gland: Secondary | ICD-10-CM | POA: Diagnosis present

## 2015-08-28 DIAGNOSIS — R778 Other specified abnormalities of plasma proteins: Secondary | ICD-10-CM | POA: Diagnosis present

## 2015-08-28 DIAGNOSIS — I248 Other forms of acute ischemic heart disease: Secondary | ICD-10-CM | POA: Diagnosis present

## 2015-08-28 DIAGNOSIS — I132 Hypertensive heart and chronic kidney disease with heart failure and with stage 5 chronic kidney disease, or end stage renal disease: Secondary | ICD-10-CM | POA: Diagnosis present

## 2015-08-28 DIAGNOSIS — J440 Chronic obstructive pulmonary disease with acute lower respiratory infection: Secondary | ICD-10-CM | POA: Diagnosis not present

## 2015-08-28 DIAGNOSIS — K219 Gastro-esophageal reflux disease without esophagitis: Secondary | ICD-10-CM | POA: Diagnosis present

## 2015-08-28 DIAGNOSIS — N2581 Secondary hyperparathyroidism of renal origin: Secondary | ICD-10-CM | POA: Diagnosis present

## 2015-08-28 DIAGNOSIS — J90 Pleural effusion, not elsewhere classified: Secondary | ICD-10-CM | POA: Insufficient documentation

## 2015-08-28 DIAGNOSIS — I2510 Atherosclerotic heart disease of native coronary artery without angina pectoris: Secondary | ICD-10-CM | POA: Diagnosis present

## 2015-08-28 DIAGNOSIS — J189 Pneumonia, unspecified organism: Secondary | ICD-10-CM | POA: Diagnosis present

## 2015-08-28 DIAGNOSIS — Z825 Family history of asthma and other chronic lower respiratory diseases: Secondary | ICD-10-CM | POA: Diagnosis not present

## 2015-08-28 DIAGNOSIS — Z833 Family history of diabetes mellitus: Secondary | ICD-10-CM | POA: Diagnosis not present

## 2015-08-28 DIAGNOSIS — J9600 Acute respiratory failure, unspecified whether with hypoxia or hypercapnia: Secondary | ICD-10-CM

## 2015-08-28 DIAGNOSIS — Z818 Family history of other mental and behavioral disorders: Secondary | ICD-10-CM

## 2015-08-28 DIAGNOSIS — G4700 Insomnia, unspecified: Secondary | ICD-10-CM | POA: Diagnosis present

## 2015-08-28 DIAGNOSIS — G4730 Sleep apnea, unspecified: Secondary | ICD-10-CM | POA: Diagnosis present

## 2015-08-28 DIAGNOSIS — F419 Anxiety disorder, unspecified: Secondary | ICD-10-CM | POA: Diagnosis present

## 2015-08-28 DIAGNOSIS — E1122 Type 2 diabetes mellitus with diabetic chronic kidney disease: Secondary | ICD-10-CM | POA: Diagnosis present

## 2015-08-28 DIAGNOSIS — Z8249 Family history of ischemic heart disease and other diseases of the circulatory system: Secondary | ICD-10-CM

## 2015-08-28 DIAGNOSIS — D509 Iron deficiency anemia, unspecified: Secondary | ICD-10-CM | POA: Diagnosis present

## 2015-08-28 DIAGNOSIS — N186 End stage renal disease: Secondary | ICD-10-CM

## 2015-08-28 DIAGNOSIS — E785 Hyperlipidemia, unspecified: Secondary | ICD-10-CM | POA: Diagnosis present

## 2015-08-28 DIAGNOSIS — R05 Cough: Secondary | ICD-10-CM

## 2015-08-28 DIAGNOSIS — E538 Deficiency of other specified B group vitamins: Secondary | ICD-10-CM | POA: Diagnosis present

## 2015-08-28 DIAGNOSIS — Z992 Dependence on renal dialysis: Secondary | ICD-10-CM

## 2015-08-28 DIAGNOSIS — J9601 Acute respiratory failure with hypoxia: Secondary | ICD-10-CM | POA: Diagnosis present

## 2015-08-28 DIAGNOSIS — F17210 Nicotine dependence, cigarettes, uncomplicated: Secondary | ICD-10-CM | POA: Diagnosis present

## 2015-08-28 DIAGNOSIS — A419 Sepsis, unspecified organism: Secondary | ICD-10-CM | POA: Diagnosis not present

## 2015-08-28 DIAGNOSIS — I10 Essential (primary) hypertension: Secondary | ICD-10-CM | POA: Diagnosis present

## 2015-08-28 DIAGNOSIS — I4891 Unspecified atrial fibrillation: Secondary | ICD-10-CM | POA: Diagnosis present

## 2015-08-28 DIAGNOSIS — E1140 Type 2 diabetes mellitus with diabetic neuropathy, unspecified: Secondary | ICD-10-CM | POA: Diagnosis present

## 2015-08-28 DIAGNOSIS — R059 Cough, unspecified: Secondary | ICD-10-CM

## 2015-08-28 DIAGNOSIS — I5032 Chronic diastolic (congestive) heart failure: Secondary | ICD-10-CM | POA: Diagnosis present

## 2015-08-28 DIAGNOSIS — E1121 Type 2 diabetes mellitus with diabetic nephropathy: Secondary | ICD-10-CM | POA: Diagnosis present

## 2015-08-28 DIAGNOSIS — Y95 Nosocomial condition: Secondary | ICD-10-CM | POA: Diagnosis present

## 2015-08-28 DIAGNOSIS — R7989 Other specified abnormal findings of blood chemistry: Secondary | ICD-10-CM | POA: Diagnosis present

## 2015-08-28 DIAGNOSIS — Z85850 Personal history of malignant neoplasm of thyroid: Secondary | ICD-10-CM | POA: Insufficient documentation

## 2015-08-28 DIAGNOSIS — E11319 Type 2 diabetes mellitus with unspecified diabetic retinopathy without macular edema: Secondary | ICD-10-CM | POA: Diagnosis present

## 2015-08-28 DIAGNOSIS — Z66 Do not resuscitate: Secondary | ICD-10-CM | POA: Diagnosis present

## 2015-08-28 DIAGNOSIS — Z820 Family history of epilepsy and other diseases of the nervous system: Secondary | ICD-10-CM | POA: Diagnosis not present

## 2015-08-28 DIAGNOSIS — R0989 Other specified symptoms and signs involving the circulatory and respiratory systems: Secondary | ICD-10-CM

## 2015-08-28 DIAGNOSIS — I480 Paroxysmal atrial fibrillation: Secondary | ICD-10-CM | POA: Diagnosis present

## 2015-08-28 DIAGNOSIS — R0602 Shortness of breath: Secondary | ICD-10-CM | POA: Diagnosis present

## 2015-08-28 LAB — BLOOD GAS, ARTERIAL
ACID-BASE EXCESS: 5.8 mmol/L — AB (ref 0.0–2.0)
BICARBONATE: 28.5 meq/L — AB (ref 20.0–24.0)
Drawn by: 234301
O2 Content: 4 L/min
O2 SAT: 90.9 %
PCO2 ART: 57.8 mmHg — AB (ref 35.0–45.0)
PO2 ART: 64.6 mmHg — AB (ref 80.0–100.0)
pH, Arterial: 7.352 (ref 7.350–7.450)

## 2015-08-28 LAB — CBC WITH DIFFERENTIAL/PLATELET
BASOS PCT: 0 %
Basophils Absolute: 0 10*3/uL (ref 0.0–0.1)
EOS ABS: 0 10*3/uL (ref 0.0–0.7)
Eosinophils Relative: 1 %
HCT: 41.3 % (ref 36.0–46.0)
Hemoglobin: 13.1 g/dL (ref 12.0–15.0)
Lymphocytes Relative: 24 %
Lymphs Abs: 0.7 10*3/uL (ref 0.7–4.0)
MCH: 28.2 pg (ref 26.0–34.0)
MCHC: 31.7 g/dL (ref 30.0–36.0)
MCV: 89 fL (ref 78.0–100.0)
MONOS PCT: 9 %
Monocytes Absolute: 0.3 10*3/uL (ref 0.1–1.0)
Neutro Abs: 2 10*3/uL (ref 1.7–7.7)
Neutrophils Relative %: 66 %
Platelets: 138 10*3/uL — ABNORMAL LOW (ref 150–400)
RBC: 4.64 MIL/uL (ref 3.87–5.11)
RDW: 16.8 % — AB (ref 11.5–15.5)
WBC: 3.1 10*3/uL — ABNORMAL LOW (ref 4.0–10.5)

## 2015-08-28 LAB — LACTIC ACID, PLASMA: LACTIC ACID, VENOUS: 0.9 mmol/L (ref 0.5–2.0)

## 2015-08-28 LAB — COMPREHENSIVE METABOLIC PANEL
ALBUMIN: 3.6 g/dL (ref 3.5–5.0)
ALT: 18 U/L (ref 14–54)
ANION GAP: 10 (ref 5–15)
AST: 24 U/L (ref 15–41)
Alkaline Phosphatase: 88 U/L (ref 38–126)
BILIRUBIN TOTAL: 0.7 mg/dL (ref 0.3–1.2)
BUN: 19 mg/dL (ref 6–20)
CO2: 31 mmol/L (ref 22–32)
Calcium: 8.6 mg/dL — ABNORMAL LOW (ref 8.9–10.3)
Chloride: 93 mmol/L — ABNORMAL LOW (ref 101–111)
Creatinine, Ser: 3.55 mg/dL — ABNORMAL HIGH (ref 0.44–1.00)
GFR calc non Af Amer: 12 mL/min — ABNORMAL LOW (ref >60)
GFR, EST AFRICAN AMERICAN: 14 mL/min — AB (ref >60)
Glucose, Bld: 95 mg/dL (ref 65–99)
Potassium: 4.1 mmol/L (ref 3.5–5.1)
Sodium: 134 mmol/L — ABNORMAL LOW (ref 135–145)
TOTAL PROTEIN: 7.2 g/dL (ref 6.5–8.1)

## 2015-08-28 LAB — TROPONIN I: Troponin I: 0.04 ng/mL — ABNORMAL HIGH (ref <0.031)

## 2015-08-28 MED ORDER — THYROID 30 MG PO TABS
90.0000 mg | ORAL_TABLET | Freq: Every day | ORAL | Status: DC
  Administered 2015-08-31: 90 mg via ORAL
  Filled 2015-08-28 (×8): qty 3

## 2015-08-28 MED ORDER — DILTIAZEM HCL ER COATED BEADS 240 MG PO CP24
240.0000 mg | ORAL_CAPSULE | Freq: Every day | ORAL | Status: DC
  Administered 2015-08-28 – 2015-09-02 (×6): 240 mg via ORAL
  Filled 2015-08-28 (×6): qty 1

## 2015-08-28 MED ORDER — METOPROLOL TARTRATE 25 MG PO TABS
12.5000 mg | ORAL_TABLET | Freq: Two times a day (BID) | ORAL | Status: DC
  Administered 2015-08-28 – 2015-09-03 (×11): 12.5 mg via ORAL
  Filled 2015-08-28 (×12): qty 1

## 2015-08-28 MED ORDER — RENA-VITE PO TABS
1.0000 | ORAL_TABLET | Freq: Every day | ORAL | Status: DC
  Administered 2015-08-28 – 2015-09-02 (×6): 1 via ORAL
  Filled 2015-08-28 (×6): qty 1

## 2015-08-28 MED ORDER — DEXTROSE 5 % IV SOLN
1.0000 g | Freq: Once | INTRAVENOUS | Status: AC
  Administered 2015-08-28: 1 g via INTRAVENOUS
  Filled 2015-08-28: qty 1

## 2015-08-28 MED ORDER — VITAMIN B-12 1000 MCG PO TABS
1000.0000 ug | ORAL_TABLET | Freq: Every day | ORAL | Status: DC
  Administered 2015-08-29 – 2015-09-03 (×5): 1000 ug via ORAL
  Filled 2015-08-28 (×5): qty 1

## 2015-08-28 MED ORDER — POLYETHYLENE GLYCOL 3350 17 G PO PACK
17.0000 g | PACK | Freq: Every day | ORAL | Status: DC
  Administered 2015-08-28 – 2015-08-31 (×3): 17 g via ORAL
  Filled 2015-08-28 (×7): qty 1

## 2015-08-28 MED ORDER — ONDANSETRON HCL 4 MG PO TABS: 4.0000 mg | ORAL_TABLET | Freq: Three times a day (TID) | ORAL | Status: DC | PRN

## 2015-08-28 MED ORDER — DEXTROSE 5 % IV SOLN
1.0000 g | Freq: Once | INTRAVENOUS | Status: DC
  Filled 2015-08-28: qty 10

## 2015-08-28 MED ORDER — ALPRAZOLAM 0.5 MG PO TABS
0.5000 mg | ORAL_TABLET | Freq: Four times a day (QID) | ORAL | Status: DC
  Administered 2015-08-28 – 2015-08-29 (×3): 1 mg via ORAL
  Administered 2015-08-29 (×2): 0.5 mg via ORAL
  Administered 2015-08-29 – 2015-08-31 (×6): 1 mg via ORAL
  Administered 2015-08-31: 0.5 mg via ORAL
  Filled 2015-08-28 (×2): qty 2
  Filled 2015-08-28: qty 1
  Filled 2015-08-28 (×6): qty 2
  Filled 2015-08-28: qty 1
  Filled 2015-08-28 (×2): qty 2

## 2015-08-28 MED ORDER — VANCOMYCIN HCL 10 G IV SOLR
1500.0000 mg | Freq: Once | INTRAVENOUS | Status: AC
  Administered 2015-08-28: 1500 mg via INTRAVENOUS
  Filled 2015-08-28: qty 1500

## 2015-08-28 MED ORDER — ALBUTEROL SULFATE (2.5 MG/3ML) 0.083% IN NEBU: 2.5000 mg | INHALATION_SOLUTION | RESPIRATORY_TRACT | Status: AC | PRN

## 2015-08-28 MED ORDER — OXYCODONE HCL 5 MG PO TABS: 5.0000 mg | ORAL_TABLET | Freq: Four times a day (QID) | ORAL | Status: DC | PRN

## 2015-08-28 MED ORDER — PANTOPRAZOLE SODIUM 40 MG PO TBEC
40.0000 mg | DELAYED_RELEASE_TABLET | Freq: Every day | ORAL | Status: DC
  Administered 2015-08-28 – 2015-09-03 (×6): 40 mg via ORAL
  Filled 2015-08-28 (×7): qty 1

## 2015-08-28 MED ORDER — SODIUM CHLORIDE 0.9 % IV SOLN
INTRAVENOUS | Status: DC
  Administered 2015-08-28: 17:00:00 via INTRAVENOUS

## 2015-08-28 MED ORDER — HYDROCODONE-ACETAMINOPHEN 7.5-325 MG PO TABS: 1.0000 | ORAL_TABLET | Freq: Four times a day (QID) | ORAL | Status: DC | PRN

## 2015-08-28 MED ORDER — APIXABAN 5 MG PO TABS
5.0000 mg | ORAL_TABLET | Freq: Two times a day (BID) | ORAL | Status: DC
  Administered 2015-08-28 – 2015-09-03 (×12): 5 mg via ORAL
  Filled 2015-08-28 (×12): qty 1

## 2015-08-28 MED ORDER — ZOLPIDEM TARTRATE 5 MG PO TABS
5.0000 mg | ORAL_TABLET | Freq: Every evening | ORAL | Status: DC | PRN
Start: 2015-08-28 — End: 2015-09-03
  Administered 2015-08-28 – 2015-09-02 (×4): 5 mg via ORAL
  Filled 2015-08-28 (×4): qty 1

## 2015-08-28 MED ORDER — AZITHROMYCIN 250 MG PO TABS
500.0000 mg | ORAL_TABLET | Freq: Once | ORAL | Status: DC
  Filled 2015-08-28: qty 2

## 2015-08-28 MED ORDER — VANCOMYCIN HCL IN DEXTROSE 750-5 MG/150ML-% IV SOLN
750.0000 mg | INTRAVENOUS | Status: DC | PRN
  Filled 2015-08-28: qty 150

## 2015-08-28 MED ORDER — DEXTROSE 5 % IV SOLN: 1.0000 g | Freq: Three times a day (TID) | INTRAVENOUS | Status: DC

## 2015-08-28 MED ORDER — DEXTROSE 5 % IV SOLN
2.0000 g | INTRAVENOUS | Status: DC | PRN
  Filled 2015-08-28: qty 2

## 2015-08-28 MED ORDER — DEXTROSE 5 % IV SOLN
1.0000 g | Freq: Once | INTRAVENOUS | Status: AC
  Filled 2015-08-28: qty 1

## 2015-08-28 MED ORDER — MECLIZINE HCL 12.5 MG PO TABS: 25.0000 mg | ORAL_TABLET | Freq: Four times a day (QID) | ORAL | Status: DC | PRN

## 2015-08-28 MED ORDER — HYDRALAZINE HCL 25 MG PO TABS
100.0000 mg | ORAL_TABLET | Freq: Three times a day (TID) | ORAL | Status: DC
  Administered 2015-08-28 – 2015-09-03 (×16): 100 mg via ORAL
  Filled 2015-08-28 (×18): qty 4

## 2015-08-28 MED ORDER — LISINOPRIL 10 MG PO TABS
40.0000 mg | ORAL_TABLET | Freq: Two times a day (BID) | ORAL | Status: DC
  Administered 2015-08-28 – 2015-09-03 (×11): 40 mg via ORAL
  Filled 2015-08-28 (×12): qty 4

## 2015-08-28 MED ORDER — SEVELAMER CARBONATE 800 MG PO TABS
2400.0000 mg | ORAL_TABLET | Freq: Three times a day (TID) | ORAL | Status: DC
  Administered 2015-08-28 – 2015-09-03 (×16): 2400 mg via ORAL
  Filled 2015-08-28 (×18): qty 3

## 2015-08-28 MED ORDER — ALBUTEROL (5 MG/ML) CONTINUOUS INHALATION SOLN
10.0000 mg/h | INHALATION_SOLUTION | RESPIRATORY_TRACT | Status: DC
  Administered 2015-08-28: 10 mg/h via RESPIRATORY_TRACT
  Filled 2015-08-28: qty 20

## 2015-08-28 MED ORDER — CALCIUM CARBONATE ANTACID 500 MG PO CHEW: 1.0000 | CHEWABLE_TABLET | Freq: Two times a day (BID) | ORAL | Status: DC | PRN

## 2015-08-28 MED ORDER — CEFEPIME HCL 1 G IJ SOLR: 1.0000 g | Freq: Three times a day (TID) | INTRAMUSCULAR | Status: DC

## 2015-08-28 NOTE — ED Notes (Signed)
Report given to Audrea Muscat for room 305.

## 2015-08-28 NOTE — ED Notes (Signed)
I called Pharmacy at this time and they stated vancomycin would be delivered to ED as soon as possible.

## 2015-08-28 NOTE — Progress Notes (Signed)
ANTIBIOTIC CONSULT NOTE - INITIAL  Pharmacy Consult for Vancomycin and Cefepime Indication: Pneumonia  Allergies  Allergen Reactions  . Doxycycline Nausea And Vomiting  . Lipitor [Atorvastatin] Nausea And Vomiting    Patient Measurements: Height: 5\' 3"  (160 cm) Weight: 153 lb (69.4 kg) IBW/kg (Calculated) : 52.4  Vital Signs: Temp: 98 F (36.7 C) (01/10 1530) Temp Source: Oral (01/10 1154) BP: 130/73 mmHg (01/10 1530) Pulse Rate: 94 (01/10 1530) Intake/Output from previous day:   Intake/Output from this shift:    Labs:  Recent Labs  08/28/15 1210  WBC 3.1*  HGB 13.1  PLT 138*  CREATININE 3.55*   Estimated Creatinine Clearance: 14.6 mL/min (by C-G formula based on Cr of 3.55). No results for input(s): VANCOTROUGH, VANCOPEAK, VANCORANDOM, GENTTROUGH, GENTPEAK, GENTRANDOM, TOBRATROUGH, TOBRAPEAK, TOBRARND, AMIKACINPEAK, AMIKACINTROU, AMIKACIN in the last 72 hours.   Microbiology: No results found for this or any previous visit (from the past 720 hour(s)).  Medical History: Past Medical History  Diagnosis Date  . Neuropathy due to secondary diabetes ([place])   . Diabetic nephropathy ([place])   . Hypertension   . Anemia   . Dyslipidemia   . Laryngeal mass   . COPD (chronic obstructive pulmonary disease) ([place])   . GERD (gastroesophageal reflux disease)   . Sleep apnea     Does not use CPAP- due to weight loss  . Atrial fibrillation ([place])     Diagnosed 11/14 of undetermined age of onset    . Diabetes mellitus with end stage renal disease ([place])   . CAD (coronary artery disease) 07/13/2013    Catheterization 5 years ago by Dr. Elisabeth Cara with reportedly nonobstructive disease Calcification noted on CT scan of chest in November of 2014   . ESRD on hemodialysis ([place])   . Chronic diastolic heart failure ([place])   . Aortic stenosis 12/23/2012  . 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 Atherosclerosis of aorta ([place])   . PONV (postoperative nausea and vomiting)   . Shortness  of breath dyspnea     with exertion  . Wears glasses   . CHF (congestive heart failure) ([place])   . Thyroid neoplasm     of uncertain behavior  . Diabetic neuropathy (HCC)     Medications:  Vancomycin 1500mg  loading dose at 1400 08/28/15 Cefepime 1gm IV at 1300 08/28/15  Assessment: 65 yo female ESRD and COPD presents with 2-3 week history of not feeling well. At dialysis 1/9, pt required more oxygen and gained weight. Admitted with hypoxic respiratory failure due to PNA. Empiric tx for HCAP. Blood and sputum cultures, strep pneumo and Legionella antigens ordered. Lactic acid 0.9.   Goal of Therapy:  target pre-HD level 15-28mcg/ml  Plan:  Give additional Cefepime 1gm today, then 2gm IV after every HD Vancomycin 750mg  IV after every HD  Measure antibiotic drug levels at steady state Follow up culture results  Monitor V/S and labs  Isac Sarna, BS Vena [place], BCPS Clinical Pharmacist Pager [phone] 08/28/2015,3:57 PM

## 2015-08-28 NOTE — H&P (Signed)
[facility] History and Physical  Anita [name] [id number] DOB: 30-Mar-1951 DOA: 08/28/2015  65 y/o ? Afib Chad2Vasc2= 4 on AC since admission 07/2013 Htn HLD Ty II DM Chr Diastolic HF-last echo [id number] Non-Obstr CAD Cartoid stenosis COPD not on home oxygen ESRD on bi-weekly dialysis at present he is on 65 under care of Dr. 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 Cindie Crumbly Chr Bladder incontinence-on premarin  2-3 week history of feeling weak and uncomfortable did not let anyone know. Had some sputum and no fever no chills Appetite decreased a little bit No rales no rhonchi No ill contacts No body aches no pains No unilateral weakness but overall fatigue No cp  Had dialysis 1/9 but noted some new oxygen requirement States because of better diet she's gained some weight Unclear what outpatient dry weight is Went to primary care physician's office   In emergency room found to  rquire oxygen sats mid-60's on RA Given neb x 2 and started on cefepime and Vanc based on CXr from PCP offcie for PNA   Troponin 0.04 Wbc 3.1 plt 138 Sodium 134 k 4.1 cxr 2vw LLL PNA   Went to college for two-year degree Worked in [employer] and lives with her husband  Smokes "very little" Nondrinker No drug use  Past Medical History  Diagnosis Date  . Neuropathy due to secondary diabetes ([place])   . Diabetic nephropathy ([place])   . Hypertension   . Anemia   . Dyslipidemia   . Laryngeal mass   . COPD (chronic obstructive pulmonary disease) ([place])   . GERD (gastroesophageal reflux disease)   . Sleep apnea     Does not use CPAP- due  to weight loss  . Atrial fibrillation ([place])     Diagnosed 11/14 of undetermined age of onset    . Diabetes mellitus with end stage renal disease ([place])   . CAD (coronary artery disease) 07/13/2013    Catheterization 5 years ago by Dr. Elisabeth Cara with reportedly nonobstructive disease Calcification noted on CT scan of chest in November of 2014   . ESRD on hemodialysis ([place])   . Chronic diastolic heart failure ([place])   . Aortic stenosis 12/23/2012  . 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 Atherosclerosis of aorta ([place])   . PONV (postoperative nausea and vomiting)   . Shortness of breath dyspnea     with exertion  . Wears glasses   . CHF (congestive heart failure) ([place])   . Thyroid neoplasm     of uncertain behavior  . Diabetic neuropathy [facility])    Past Surgical History  Procedure Laterality Date  . Cholecystectomy    . Appendectomy    . Shoulder arthroscopy Right     w/ repair of rotator cuff repair  . Elbow tendon surgery Right   . Achilles tendon repair Right   . Cesarean section      X 2   . Hemorrhoid surgery      many years ago  . Av fistula placement Left 12/29/2012    Procedure: ARTERIOVENOUS (AV) FISTULA CREATION;  Surgeon: Elam Dutch, MD;  Location: [facility] OR;  Service: Vascular;  Laterality: Left;  Creation Left Brachial  Cephalic Arteriovenous Fistula  . Cardiac catheterization    . Laryngectomy      mass removed- noncancerous  . Lumbar laminectomy      lower back  . Thyroidectomy N/A 10/19/2014    Procedure: TOTAL THYROIDECTOMY;  Surgeon: Armandina Gemma, MD;  Location: [facility];  Service: General;  Laterality: N/A;   Social History:  Social History   Social History Narrative   65 yo woman who lives at home with her husband of 45 years. She has a 40 pack year history of smoking, denies etoh or illicit drug use. 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     Allergies   Allergen Reactions  . Doxycycline Nausea And Vomiting  . Lipitor [Atorvastatin] Nausea And Vomiting    Family History  Problem Relation Age of Onset  . COPD Mother   . Thyroid disease Mother   . Multiple sclerosis Father   . Heart disease Father   . Heart attack Father   . Depression Brother     Suicide  . Alcohol abuse Brother   . Heart disease Brother   . Asthma Brother   . Thyroid disease Daughter   . Diabetes Maternal Aunt      Prior to Admission medications   Medication Sig Start Date End Date Taking? Authorizing Provider  ALPRAZolam Duanne Moron) 1 MG tablet Take 0.5-1 mg by mouth 4 (four) times daily. Patient takes 1/2 tablet 2-3 times daily and 1 tablet at bedtime   Yes Historical Provider, MD  calcium carbonate (TUMS - DOSED IN MG ELEMENTAL CALCIUM) 500 MG chewable tablet Chew 1 tablet by mouth 2 (two) times daily as needed for heartburn (Takes with snacks. In place of Renvela).   Yes Historical Provider, MD  Cyanocobalamin (VITAMIN B 12 PO) Take 1,000 mg by mouth daily.   Yes Historical Provider, MD  diltiazem (TIAZAC) 240 MG 24 hr capsule Take 1 capsule (240 mg total) by mouth at bedtime. 03/13/15  Yes Arnoldo Lenis, MD  ELIQUIS 5 MG TABS tablet Take 5 mg by mouth 2 (two) times daily.  01/01/14  Yes Historical Provider, MD  furosemide (LASIX) 80 MG tablet Take 80 mg by mouth. Takes Tues,wed,thurs 02/20/15  Yes Historical Provider, MD  hydrALAZINE (APRESOLINE) 100 MG tablet Take 100 mg by mouth 3 (three) times daily.   Yes Historical Provider, MD  HYDROcodone-acetaminophen (NORCO) 7.5-325 MG per tablet Take 1 tablet by mouth every 6 (six) hours as needed for pain. 02/15/13  Yes Regina J Roczniak, PA-C  lisinopril (PRINIVIL,ZESTRIL) 40 MG tablet Take 40 mg by mouth 2 (two) times daily.   Yes Historical Provider, MD  meclizine (ANTIVERT) 25 MG tablet Take 25 mg by mouth 4 (four) times daily as needed for dizziness.   Yes Historical Provider, MD  multivitamin (RENA-VIT) TABS tablet  Take 1 tablet by mouth at bedtime. 07/18/13  Yes Blain Pais, MD  omeprazole (PRILOSEC) 20 MG capsule Take 20 mg by mouth daily as needed (acid reflux).    Yes Historical Provider, MD  ondansetron (ZOFRAN) 4 MG tablet Take 4 mg by mouth every 8 (eight) hours as needed.  12/16/14  Yes Historical Provider, MD  oxyCODONE (OXY IR/ROXICODONE) 5 MG immediate release tablet Take 1-2 tablets (5-10 mg total) by mouth every 6 (six) hours as needed for moderate pain. 10/20/14  Yes Armandina Gemma, MD  polyethylene glycol [facility] /  GLYCOLAX) packet Take 17 g by mouth daily.   Yes Historical Provider, MD  sevelamer carbonate (RENVELA) 800 MG tablet Take 2,400 mg by mouth 3 (three) times daily with meals.    Yes Historical Provider, MD  Thyroid (NATURE-THROID) 97.5 MG TABS Take 97.5 mg by mouth daily.   Yes Historical Provider, MD  zolpidem (AMBIEN) 10 MG tablet Take 10 mg by mouth at bedtime as needed for sleep.   Yes Historical Provider, MD  metoprolol tartrate (LOPRESSOR) 25 MG tablet 12.5 mg 2 (two) times daily.  03/02/15   Historical Provider, MD   Physical Exam: Filed Vitals:   08/28/15 1330 08/28/15 1400 08/28/15 1415 08/28/15 1430  BP: 138/70 122/57  127/60  Pulse: 90 96 92 96  Temp:      TempSrc:      Resp: 21 20 20 23   Height:      Weight:      SpO2: 100% 94% 95% 94%       EOMI NCAT frail tired-appearing female Mild JVD S1-S2 no murmur rub or gallop abd soft nt nd cta b cannot appreciate any adventitious sounds Trace LE edema Neuro-tired but moving 4 limbs, oriented     Labs on Admission:  Basic Metabolic Panel:  Recent Labs Lab 08/28/15 1210  NA 134*  K 4.1  CL 93*  CO2 31  GLUCOSE 95  BUN 19  CREATININE 3.55*  CALCIUM 8.6*   Liver Function Tests:  Recent Labs Lab 08/28/15 1210  AST 24  ALT 18  ALKPHOS 88  BILITOT 0.7  PROT 7.2  ALBUMIN 3.6   No results for input(s): LIPASE, AMYLASE in the last 168 hours. No results for input(s): AMMONIA in the last 168  hours. CBC:  Recent Labs Lab 08/28/15 1210  WBC 3.1*  NEUTROABS 2.0  HGB 13.1  HCT 41.3  MCV 89.0  PLT 138*   Cardiac Enzymes:  Recent Labs Lab 08/28/15 1210  TROPONINI 0.04*    BNP (last 3 results) No results for input(s): BNP in the last 8760 hours.  ProBNP (last 3 results) No results for input(s): PROBNP in the last 8760 hours.  CBG: No results for input(s): GLUCAP in the last 168 hours.  Radiological Exams on Admission: Dg Chest 2 View  08/28/2015  CLINICAL DATA:  Cough.  History of thyroid cancer.  COPD. EXAM: CHEST  2 VIEW COMPARISON:  10/12/2014 FINDINGS: There is hyperinflation of the lungs compatible with COPD. Stable cardiomegaly. Chronic bilateral pleural effusions again noted, stable, right slightly greater than left. Increasing density at the left lung base could reflect left lower lobe pneumonia. Mild cardiomegaly. No overt edema. IMPRESSION: Stable chronic bilateral pleural effusions. COPD.  Cardiomegaly. New airspace opacity at the left base concerning for left lower lobe pneumonia. Electronically Signed   By: Rolm Baptise M.D.   On: 08/28/2015 10:19    EKG: Independently reviewed. Sinus rhtmh, LBBB no acute ishemia  Assessment/Plan Active Problems:   Sepsis (HCC)   PNA (pneumonia)    Hypoxic respiratory failur e 2/2 to PNA superimposed on COPD with Chr D hf and ESRD  Pneumonia, healthcare associated Start IV vancomycin, cefepime/ceftriaxone CBC plus differential in a.m. Monitor clinically and de-escalate therapy if feeling better Relatively immunocompromised secondary to cancer Not hypotensive and/or septic appearing therefore would not give stress dose steroids at this time Cautious saline repletion 50 cc an hour given renal issues Hold home dosages of some medications as below  ESRD dialyzes Monday and Friday on [address]-Dr. 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 if no arrhythmia  Secondary hyperparathyroidism Continue Renvela 2.4 g 3 times a day, Rena-Vite, Calcium carbonate 1 tablet twice a day, Obtain phosphorus with next labs  Anemia of iron deficiency, B12 deficiency Continue vitamin B12 1000 daily  Reflux Continue Protonix 40 daily as a substitute for omeprazole  Pain Continue oxycodone 5-10 mg every 6 when necessary Continue MiraLAX for constipation to ensure daily stool  Anxiety and insomnia Continue Xanax as per home regimen half tablet 2-3 times daily and 1 tablet daily at bedtime Continue Ambien 10 daily at bedtime when necessary  DNR confirmed bedside 65 minutes Inpatient   08/28/2015, 2:55 PM

## 2015-08-28 NOTE — ED Notes (Signed)
MD at bedside. 

## 2015-08-28 NOTE — ED Notes (Signed)
Critical CO2 values given to Dr. Sabra Heck from ABG results.

## 2015-08-28 NOTE — ED Notes (Signed)
Pt states that she has had a bad productive cough with a lot of phlegm making her vomit and not feeling well x3 weeks.  States she was sent by her pcp this morning for evaluation of pneumonia.

## 2015-08-28 NOTE — ED Provider Notes (Signed)
CSN: [id number]     Arrival date & time 08/28/15  1147 History   First MD Initiated Contact with Patient 08/28/15 1200     Chief Complaint  Patient presents with  . Cough  . Shortness of Breath     (Consider location/radiation/quality/duration/timing/severity/associated sxs/prior Treatment) HPI Comments: The patient is a 65 year old female, she has a known history of hypertension, COPD as well as end-stage renal disease on dialysis on Mondays and Fridays. She reports that she did go to dialysis yesterday, because she was hypoxic to have her on some oxygen. She has had a productive cough with lots of phlegm, this is an ongoing for several weeks but gradually getting worse. She was sent here by her doctor this morning for evaluation for pneumonia.  She does have atrial fibrillation and is on a blood thinner, eliquis. She also has a history of congestive heart failure reported in the medical record, history of aortic stenosis though based on her echocardiogram from 2014 this was mild.  Had concentric LVH  Patient is a 65 y.o. female presenting with cough and shortness of breath. The history is provided by the patient.  Cough Associated symptoms: shortness of breath   Shortness of Breath Associated symptoms: cough     Past Medical History  Diagnosis Date  . Neuropathy due to secondary diabetes ([place])   . Diabetic nephropathy ([place])   . Hypertension   . Anemia   . Dyslipidemia   . Laryngeal mass   . COPD (chronic obstructive pulmonary disease) ([place])   . GERD (gastroesophageal reflux disease)   . Sleep apnea     Does not use CPAP- due to weight loss  . Atrial fibrillation ([place])     Diagnosed 11/14 of undetermined age of onset    . 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 CAD (coronary artery disease) 07/13/2013    Catheterization 5 years ago by Dr. Elisabeth Cara with reportedly nonobstructive disease Calcification noted on CT scan of chest in November of 2014   . ESRD on  hemodialysis ([place])   . Chronic diastolic heart failure ([place])   . Aortic stenosis 12/23/2012  . 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 Atherosclerosis of aorta ([place])   . PONV (postoperative nausea and vomiting)   . Shortness of breath dyspnea     with exertion  . Wears glasses   . CHF (congestive heart failure) ([place])   . Thyroid neoplasm     of uncertain behavior  . Diabetic neuropathy [facility])    Past Surgical History  Procedure Laterality Date  . Cholecystectomy    . Appendectomy    . Shoulder arthroscopy Right     w/ repair of rotator cuff repair  . Elbow tendon surgery Right   . Achilles tendon repair Right   . Cesarean section      X 2   . Hemorrhoid surgery      many years ago  . Av fistula placement Left 12/29/2012    Procedure: ARTERIOVENOUS (AV) FISTULA CREATION;  Surgeon: Elam Dutch, MD;  Location: [facility] OR;  Service: Vascular;  Laterality: Left;  Creation Left Brachial Cephalic Arteriovenous Fistula  . Cardiac catheterization    . Laryngectomy      mass removed- noncancerous  . Lumbar laminectomy      lower back  . Thyroidectomy N/A 10/19/2014    Procedure: TOTAL THYROIDECTOMY;  Surgeon: Armandina Gemma, MD;  Location: [facility];  Service: General;  Laterality: N/A;  Family History  Problem Relation Age of Onset  . COPD Mother   . Thyroid disease Mother   . Multiple sclerosis Father   . Heart disease Father   . Heart attack Father   . Depression Brother     Suicide  . Alcohol abuse Brother   . Heart disease Brother   . Asthma Brother   . Thyroid disease Daughter   . Diabetes Maternal Aunt    Social History  Substance Use Topics  . Smoking status: Current Every Day Smoker -- 0.50 packs/day for 40 years    Types: Cigarettes, E-cigarettes    Start date: 08/05/1969  . Smokeless tobacco: Never Used  . Alcohol Use: No   OB History    No data available     Review of Systems  Respiratory: Positive for cough and shortness of breath.   All other systems  reviewed and are negative.     Allergies  Doxycycline and Lipitor  Home Medications   Prior to Admission medications   Medication Sig Start Date End Date Taking? Authorizing Provider  ALPRAZolam Duanne Moron) 1 MG tablet Take 0.5-1 mg by mouth 4 (four) times daily. Patient takes 1/2 tablet 2-3 times daily and 1 tablet at bedtime   Yes Historical Provider, MD  calcium carbonate (TUMS - DOSED IN MG ELEMENTAL CALCIUM) 500 MG chewable tablet Chew 1 tablet by mouth 2 (two) times daily as needed for heartburn (Takes with snacks. In place of Renvela).   Yes Historical Provider, MD  Cyanocobalamin (VITAMIN B 12 PO) Take 1,000 mg by mouth daily.   Yes Historical Provider, MD  diltiazem (TIAZAC) 240 MG 24 hr capsule Take 1 capsule (240 mg total) by mouth at bedtime. 03/13/15  Yes Arnoldo Lenis, MD  ELIQUIS 5 MG TABS tablet Take 5 mg by mouth 2 (two) times daily.  01/01/14  Yes Historical Provider, MD  furosemide (LASIX) 80 MG tablet Take 80 mg by mouth. Takes Tues,wed,thurs 02/20/15  Yes Historical Provider, MD  hydrALAZINE (APRESOLINE) 100 MG tablet Take 100 mg by mouth 3 (three) times daily.   Yes Historical Provider, MD  HYDROcodone-acetaminophen (NORCO) 7.5-325 MG per tablet Take 1 tablet by mouth every 6 (six) hours as needed for pain. 02/15/13  Yes Regina J Roczniak, PA-C  lisinopril (PRINIVIL,ZESTRIL) 40 MG tablet Take 40 mg by mouth 2 (two) times daily.   Yes Historical Provider, MD  meclizine (ANTIVERT) 25 MG tablet Take 25 mg by mouth 4 (four) times daily as needed for dizziness.   Yes Historical Provider, MD  multivitamin (RENA-VIT) TABS tablet Take 1 tablet by mouth at bedtime. 07/18/13  Yes Blain Pais, MD  omeprazole (PRILOSEC) 20 MG capsule Take 20 mg by mouth daily as needed (acid reflux).    Yes Historical Provider, MD  ondansetron (ZOFRAN) 4 MG tablet Take 4 mg by mouth every 8 (eight) hours as needed.  12/16/14  Yes Historical Provider, MD  oxyCODONE (OXY IR/ROXICODONE) 5 MG immediate  release tablet Take 1-2 tablets (5-10 mg total) by mouth every 6 (six) hours as needed for moderate pain. 10/20/14  Yes Armandina Gemma, MD  polyethylene glycol (MIRALAX / GLYCOLAX) packet Take 17 g by mouth daily.   Yes Historical Provider, MD  sevelamer carbonate (RENVELA) 800 MG tablet Take 2,400 mg by mouth 3 (three) times daily with meals.    Yes Historical Provider, MD  Thyroid (NATURE-THROID) 97.5 MG TABS Take 97.5 mg by mouth daily.   Yes Historical Provider, MD  zolpidem Lorrin Mais)  10 MG tablet Take 10 mg by mouth at bedtime as needed for sleep.   Yes Historical Provider, MD  metoprolol tartrate (LOPRESSOR) 25 MG tablet 12.5 mg 2 (two) times daily.  03/02/15   Historical Provider, MD   BP 122/57 mmHg  Pulse 96  Temp(Src) 98 F (36.7 C) (Oral)  Resp 20  Ht 5\' 3"  (1.6 m)  Wt 153 lb (69.4 kg)  BMI 27.11 kg/m2  SpO2 94% Physical Exam  Constitutional: She appears well-developed and well-nourished. No distress.  HENT:  Head: Normocephalic and atraumatic.  Mouth/Throat: Oropharynx is clear and moist. No oropharyngeal exudate.  Eyes: Conjunctivae and EOM are normal. Pupils are equal, round, and reactive to light. Right eye exhibits no discharge. Left eye exhibits no discharge. No scleral icterus.  Neck: Normal range of motion. Neck supple. No JVD present. No thyromegaly present.  Cardiovascular: Normal rate, regular rhythm and intact distal pulses.  Exam reveals no gallop and no friction rub.   Murmur heard. Pulmonary/Chest: Effort normal. No respiratory distress. She has no wheezes. She has rales ( Subtle rales and rhonchi, no wheezing, speaks in full sentences, no distress).  Abdominal: Soft. Bowel sounds are normal. She exhibits no distension and no mass. There is no tenderness.  Musculoskeletal: Normal range of motion. She exhibits edema ( Bilateral lower extremity edema pitting and symmetrical below the knees). She exhibits no tenderness.  Lymphadenopathy:    She has no cervical adenopathy.   Neurological: She is alert. Coordination normal.  Skin: Skin is warm and dry. No rash noted. No erythema.  Psychiatric: She has a normal mood and affect. Her behavior is normal.  Nursing note and vitals reviewed.   ED Course  Procedures (including critical care time) Labs Review Labs Reviewed  BLOOD GAS, ARTERIAL - Abnormal; Notable for the following:    pCO2 arterial 57.8 (*)    pO2, Arterial 64.6 (*)    Bicarbonate 28.5 (*)    Acid-Base Excess 5.8 (*)    All other components within normal limits  CBC WITH DIFFERENTIAL/PLATELET - Abnormal; Notable for the following:    WBC 3.1 (*)    RDW 16.8 (*)    Platelets 138 (*)    All other components within normal limits  COMPREHENSIVE METABOLIC PANEL - Abnormal; Notable for the following:    Sodium 134 (*)    Chloride 93 (*)    Creatinine, Ser 3.55 (*)    Calcium 8.6 (*)    GFR calc non Af Amer 12 (*)    GFR calc Af Amer 14 (*)    All other components within normal limits  TROPONIN I - Abnormal; Notable for the following:    Troponin I 0.04 (*)    All other components within normal limits  URINALYSIS, ROUTINE W REFLEX MICROSCOPIC (NOT AT [facility])  LACTIC ACID, PLASMA  LACTIC ACID, PLASMA    Imaging Review Dg Chest 2 View  08/28/2015  CLINICAL DATA:  Cough.  History of thyroid cancer.  COPD. EXAM: CHEST  2 VIEW COMPARISON:  10/12/2014 FINDINGS: There is hyperinflation of the lungs compatible with COPD. Stable cardiomegaly. Chronic bilateral pleural effusions again noted, stable, right slightly greater than left. Increasing density at the left lung base could reflect left lower lobe pneumonia. Mild cardiomegaly. No overt edema. IMPRESSION: Stable chronic bilateral pleural effusions. COPD.  Cardiomegaly. New airspace opacity at the left base concerning for left lower lobe pneumonia. Electronically Signed   By: Rolm Baptise M.D.   On: 08/28/2015 10:19  I have personally reviewed and evaluated these images and lab results as part of my  medical decision-making.  ED ECG REPORT  I personally interpreted this EKG   Date: 08/28/2015   Rate: 78  Rhythm: atrial fibrillation  QRS Axis: normal  Intervals: normal  ST/T Wave abnormalities: normal  Conduction Disutrbances:none  Narrative Interpretation:   Old EKG Reviewed: unchanged   MDM   Final diagnoses:  Sepsis, due to unspecified organism ([place])  HCAP (healthcare-associated pneumonia)   The patient is an ill, hypoxic, oxygen is approximately 80% on supplemental oxygen, will increase to 4 L, obtain chest x-ray and labs, anticipate the need for admission given hypoxia. Question the etiology of pulmonary edema versus infection, that she is on a novel oral anticoagulant makes it less likely to be a pulmonary embolism. No signs on the EKG of acute ischemia.   CXR shows a LLL infiltrate c/w pna - she has been given broad spec abx for a HCAP - she has a lowered WBC, concerning for sepsis with low WBC, hypoxia and tachypnea with borderline tachycardia - lactic acid ordered, admit to hospitalist.  D/w Dr. Verlon Au who will admit - holding orders requeted  CRITICAL CARE Performed by: Johnna Acosta Total critical care time: 35 minutes Critical care time was exclusive of separately billable procedures and treating other patients. Critical care was necessary to treat or prevent imminent or life-threatening deterioration. Critical care was time spent personally by me on the following activities: development of treatment plan with patient and/or surrogate as well as nursing, discussions with consultants, evaluation of patient's response to treatment, examination of patient, obtaining history from patient or surrogate, ordering and performing treatments and interventions, ordering and review of laboratory studies, ordering and review of radiographic studies, pulse oximetry and re-evaluation of patient's condition.   Meds given in ED:  Medications  albuterol (PROVENTIL,VENTOLIN)  solution continuous neb (0 mg/hr Nebulization Stopped 08/28/15 1335)  vancomycin (VANCOCIN) 1,500 mg in sodium chloride 0.9 % 500 mL IVPB (not administered)  ceFEPIme (MAXIPIME) 1 g in dextrose 5 % 50 mL IVPB (0 g Intravenous Stopped 08/28/15 1331)    New Prescriptions   No medications on file      Noemi Chapel, MD 08/28/15 1414

## 2015-08-29 ENCOUNTER — Inpatient Hospital Stay (HOSPITAL_COMMUNITY): Payer: Medicare Other

## 2015-08-29 DIAGNOSIS — J189 Pneumonia, unspecified organism: Secondary | ICD-10-CM

## 2015-08-29 DIAGNOSIS — A419 Sepsis, unspecified organism: Secondary | ICD-10-CM

## 2015-08-29 DIAGNOSIS — Z992 Dependence on renal dialysis: Secondary | ICD-10-CM

## 2015-08-29 DIAGNOSIS — J9601 Acute respiratory failure with hypoxia: Secondary | ICD-10-CM

## 2015-08-29 DIAGNOSIS — I480 Paroxysmal atrial fibrillation: Secondary | ICD-10-CM

## 2015-08-29 DIAGNOSIS — N186 End stage renal disease: Secondary | ICD-10-CM

## 2015-08-29 LAB — HIV ANTIBODY (ROUTINE TESTING W REFLEX): HIV SCREEN 4TH GENERATION: NONREACTIVE

## 2015-08-29 MED ORDER — GUAIFENESIN ER 600 MG PO TB12
1200.0000 mg | ORAL_TABLET | Freq: Two times a day (BID) | ORAL | Status: DC
  Administered 2015-08-29 – 2015-09-03 (×7): 1200 mg via ORAL
  Filled 2015-08-29 (×10): qty 2

## 2015-08-29 MED ORDER — BENZONATATE 100 MG PO CAPS
200.0000 mg | ORAL_CAPSULE | Freq: Three times a day (TID) | ORAL | Status: DC | PRN
Start: 2015-08-29 — End: 2015-09-03

## 2015-08-29 MED ORDER — IPRATROPIUM-ALBUTEROL 0.5-2.5 (3) MG/3ML IN SOLN
3.0000 mL | Freq: Four times a day (QID) | RESPIRATORY_TRACT | Status: DC
  Administered 2015-08-29 – 2015-09-01 (×14): 3 mL via RESPIRATORY_TRACT
  Filled 2015-08-29 (×14): qty 3

## 2015-08-29 NOTE — Progress Notes (Signed)
[facility] PROGRESS NOTE  Anita [name] [id number] DOB: March 22, 1951 DOA: 08/28/2015 PCP: Robert Bellow, MD  Assessment/Plan: 1. HCAP, continue IV abx. Afebrile. Continue pulmonary hygiene.  2. Acute hypoxic respiratory failure. Currently on 2L [place], will wean as tolerated.  3. ESRD, usually dialyzed Mon and Fridays. Nephrology consulted for possible dialysis during admission given evidence of mild fluid overload.  4. Thyroid CA stage 2, s/p total thyroidectomy.  Follow up with Dr. Harlow Asa.  5. HTN, stable. Continue to monitor 6. Atrial fibrillation, currently in NSR. Continue Eliquis, Cardizem, and Metoprolol. 7. Secondary hyperparathyroidism, continue Renvela and calcium carbonate.  8. Elevated troponin, likely related to demand ischemia. EKG non-acute, denies CP. Will continue to trend.  9. Anxiety and insomnia, continue Xanax and Ambien.   Code Status: DNR DVT prophylaxis: Eliquis Family Communication: No family at bedside.  Disposition Plan: Anticipate discharge in 2-3 days.   Consultants:    Procedures:    Antibiotics:  Vancomycin 1/10>>  Ceftriaxone 1/10>>  HPI/Subjective: Breathing is improved. Still has a productive cough. Tolerating a normal diet well.   Objective: Filed Vitals:   08/28/15 2007 08/29/15 0608  BP: 136/67 137/69  Pulse: 94 80  Temp: 98.4 F (36.9 C) 98 F (36.7 C)  Resp: 22 20   No intake or output data in the 24 hours ending 08/29/15 0848 Filed Weights   08/28/15 1154 08/28/15 1627 08/29/15 0608  Weight: 69.4 kg (153 lb) 67.643 kg (149 lb 2 oz) 67.586 kg (149 lb)    Exam:  General: NAD, looks comfortable Cardiovascular: RRR, 3/6 systolic ejection murmur. Respiratory: Crackles at the bases.  Abdomen: soft, non tender, no distention , bowel sounds normal Musculoskeletal: 1+ edema b/l   Data Reviewed: Basic Metabolic Panel:  Recent Labs Lab 08/28/15 1210  NA 134*  K 4.1  CL 93*  CO2 31  GLUCOSE 95  BUN 19   CREATININE 3.55*  CALCIUM 8.6*   Liver Function Tests:  Recent Labs Lab 08/28/15 1210  AST 24  ALT 18  ALKPHOS 88  BILITOT 0.7  PROT 7.2  ALBUMIN 3.6   CBC:  Recent Labs Lab 08/28/15 1210  WBC 3.1*  NEUTROABS 2.0  HGB 13.1  HCT 41.3  MCV 89.0  PLT 138*   Cardiac Enzymes:  Recent Labs Lab 08/28/15 1210  TROPONINI 0.04*    Recent Results (from the past 240 hour(s))  Culture, blood (routine x 2) Call MD if unable to obtain prior to antibiotics being given     Status: None (Preliminary result)   Collection Time: 08/28/15  4:35 PM  Result Value Ref Range Status   Specimen Description BLOOD RIGHT FOREARM  Final   Special Requests BOTTLES DRAWN AEROBIC AND ANAEROBIC 6CC  Final   Culture PENDING  Incomplete   Report Status PENDING  Incomplete  Culture, blood (routine x 2) Call MD if unable to obtain prior to antibiotics being given     Status: None (Preliminary result)   Collection Time: 08/28/15  5:05 PM  Result Value Ref Range Status   Specimen Description RIGHT ANTECUBITAL  Final   Special Requests BOTTLES DRAWN AEROBIC AND ANAEROBIC [facility]  Final   Culture PENDING  Incomplete   Report Status PENDING  Incomplete     Studies: Dg Chest 2 View  08/28/2015  CLINICAL DATA:  Cough.  History of thyroid cancer.  COPD. EXAM: CHEST  2 VIEW COMPARISON:  10/12/2014 FINDINGS: There is hyperinflation of the lungs compatible with COPD. Stable cardiomegaly. Chronic bilateral pleural  effusions again noted, stable, right slightly greater than left. Increasing density at the left lung base could reflect left lower lobe pneumonia. Mild cardiomegaly. No overt edema. IMPRESSION: Stable chronic bilateral pleural effusions. COPD.  Cardiomegaly. New airspace opacity at the left base concerning for left lower lobe pneumonia. Electronically Signed   By: Rolm Baptise M.D.   On: 08/28/2015 10:19    Scheduled Meds: . ALPRAZolam  0.5-1 mg Oral QID  . apixaban  5 mg Oral BID  . diltiazem  240  mg Oral QHS  . hydrALAZINE  100 mg Oral TID  . lisinopril  40 mg Oral BID  . metoprolol tartrate  12.5 mg Oral BID  . multivitamin  1 tablet Oral QHS  . pantoprazole  40 mg Oral Daily  . polyethylene glycol  17 g Oral Daily  . sevelamer carbonate  2,400 mg Oral TID WC  . thyroid  90 mg Oral Daily  . vitamin B-12  1,000 mcg Oral Daily   Continuous Infusions: . sodium chloride 50 mL/hr at 08/28/15 1711    Active Problems:   Sepsis ([place])   PNA (pneumonia)    Time spent: 25 minutes   Trejan Buda. MD  [facility] Pager [phone]. If 7PM-7AM, please contact night-coverage at [email or web address], password [facility] 08/29/2015, 8:48 AM  LOS: 1 day     By signing my name below, I, Rosalie Doctor, attest that this documentation has been prepared under the direction and in the presence of [facility]. MD Electronically Signed: Rosalie Doctor, Scribe. 08/29/2015 1:10pm  I, Dr. Kathie Dike, personally performed the services described in this documentaiton. All medical record entries made by the scribe were at my direction and in my presence. I have reviewed the chart and agree that the record reflects my personal performance and is accurate and complete  Kathie Dike, MD, 08/29/2015 1:31 PM

## 2015-08-29 NOTE — Care Management Note (Signed)
Case Management Note  Patient Details  Name: Anita [name] MRN: [id number] Date of Birth: July 26, 1951  Subjective/Objective:                  Admitted with sepsis. Pt is from home, lives with her husband and is ind at baseline. Pt has no home health services or DME prior to admission. Pt receives HD from [facility] in [place] on M [address]. Pt plans to return home at DC.   Action/Plan: Pt appears very ill. Pt may need PT eval and home O2 assessment prior to DC. Will cont to follow.  Expected Discharge Date:  09/01/15               Expected Discharge Plan:  Home/Self Care  In-House Referral:  NA  Discharge planning Services  CM Consult  Post Acute Care Choice:  NA Choice offered to:  NA  DME Arranged:    DME Agency:     HH Arranged:    HH Agency:     Status of Service:  In process, will continue to follow  Medicare Important Message Given:    Date Medicare IM Given:    Medicare IM give by:    Date Additional Medicare IM Given:    Additional Medicare Important Message give by:     If discussed at [place] of Stay Meetings, dates discussed:    Additional Comments:  Sherald Barge, RN 08/29/2015, 3:01 PM

## 2015-08-30 DIAGNOSIS — I10 Essential (primary) hypertension: Secondary | ICD-10-CM

## 2015-08-30 LAB — CBC
HEMATOCRIT: 37.4 % (ref 36.0–46.0)
HEMOGLOBIN: 11.6 g/dL — AB (ref 12.0–15.0)
MCH: 27.6 pg (ref 26.0–34.0)
MCHC: 31 g/dL (ref 30.0–36.0)
MCV: 89 fL (ref 78.0–100.0)
Platelets: 114 10*3/uL — ABNORMAL LOW (ref 150–400)
RBC: 4.2 MIL/uL (ref 3.87–5.11)
RDW: 16.1 % — AB (ref 11.5–15.5)
WBC: 4 10*3/uL (ref 4.0–10.5)

## 2015-08-30 LAB — MRSA PCR SCREENING: MRSA by PCR: NEGATIVE

## 2015-08-30 LAB — BASIC METABOLIC PANEL
ANION GAP: 10 (ref 5–15)
BUN: 36 mg/dL — AB (ref 6–20)
CALCIUM: 7.6 mg/dL — AB (ref 8.9–10.3)
CO2: 27 mmol/L (ref 22–32)
Chloride: 96 mmol/L — ABNORMAL LOW (ref 101–111)
Creatinine, Ser: 4.86 mg/dL — ABNORMAL HIGH (ref 0.44–1.00)
GFR calc Af Amer: 10 mL/min — ABNORMAL LOW (ref >60)
GFR calc non Af Amer: 8 mL/min — ABNORMAL LOW (ref >60)
GLUCOSE: 135 mg/dL — AB (ref 65–99)
Potassium: 4.5 mmol/L (ref 3.5–5.1)
Sodium: 133 mmol/L — ABNORMAL LOW (ref 135–145)

## 2015-08-30 MED ORDER — VANCOMYCIN HCL IN DEXTROSE 750-5 MG/150ML-% IV SOLN
750.0000 mg | Freq: Once | INTRAVENOUS | Status: AC
Start: 2015-08-30 — End: 2015-08-30
  Administered 2015-08-30: 750 mg via INTRAVENOUS
  Filled 2015-08-30: qty 150

## 2015-08-30 NOTE — Progress Notes (Signed)
[facility] PROGRESS NOTE  Anita [name] [id number] DOB: April 10, 1951 DOA: 08/28/2015 PCP: Robert Bellow, MD  Assessment/Plan: 1. HCAP, continue IV abx. Afebrile, WBC and lactic acid wnl. Continue pulmonary hygiene.  2. Acute hypoxic respiratory failure. Currently on 3L [place], will wean as tolerated. She still appears to be very SOB.  3. ESRD, usually dialyzed Mon and Fridays. Nephrology consulted and will arrange for dialysis tomorrow.   4. Thyroid CA stage 2, s/p total thyroidectomy.  Follow up with Dr. Harlow Asa.  5. HTN, stable. Continue to monitor 6. Atrial fibrillation, currently in NSR. Continue Eliquis, Cardizem, and Metoprolol. 7. Secondary hyperparathyroidism, continue Renvela and calcium carbonate.  8. Elevated troponin, likely related to demand ischemia. EKG non-acute, denies CP. Will continue to trend.  9. Anxiety and insomnia, continue Xanax and Ambien.   Code Status: DNR DVT prophylaxis: Eliquis Family Communication: Family at bedside.  Disposition Plan: Anticipate discharge in 1-2 days.    Consultants:  Nephrology  Procedures:    Antibiotics:  Vancomycin 1/10>>  Ceftriaxone 1/10>>  HPI/Subjective: Feels about the same.  Objective: Filed Vitals:   08/29/15 2222 08/30/15 0500  BP: 134/59 127/47  Pulse: 81 52  Temp:  98.8 F (37.1 C)  Resp: 20 20    Intake/Output Summary (Last 24 hours) at 08/30/15 0817 Last data filed at 08/30/15 0634  Gross per 24 hour  Intake 832.33 ml  Output      0 ml  Net 832.33 ml   Filed Weights   08/28/15 1627 08/29/15 0608 08/30/15 0500  Weight: 67.643 kg (149 lb 2 oz) 67.586 kg (149 lb) 70.126 kg (154 lb 9.6 oz)    Exam:  General: NAD, looks comfortable Cardiovascular: RRR, 3/6 systolic ejection murmur. Respiratory: CTA bilaterally Abdomen: soft, non tender, no distention , bowel sounds normal Musculoskeletal: 1+ edema b/l   Data Reviewed: Basic Metabolic Panel:  Recent Labs Lab 08/28/15 1210  08/30/15 0516  NA 134* 133*  K 4.1 4.5  CL 93* 96*  CO2 31 27  GLUCOSE 95 135*  BUN 19 36*  CREATININE 3.55* 4.86*  CALCIUM 8.6* 7.6*   Liver Function Tests:  Recent Labs Lab 08/28/15 1210  AST 24  ALT 18  ALKPHOS 88  BILITOT 0.7  PROT 7.2  ALBUMIN 3.6   CBC:  Recent Labs Lab 08/28/15 1210 08/30/15 0516  WBC 3.1* 4.0  NEUTROABS 2.0  --   HGB 13.1 11.6*  HCT 41.3 37.4  MCV 89.0 89.0  PLT 138* 114*   Cardiac Enzymes:  Recent Labs Lab 08/28/15 1210  TROPONINI 0.04*    Recent Results (from the past 240 hour(s))  Culture, blood (routine x 2) Call MD if unable to obtain prior to antibiotics being given     Status: None (Preliminary result)   Collection Time: 08/28/15  4:35 PM  Result Value Ref Range Status   Specimen Description BLOOD RIGHT FOREARM  Final   Special Requests BOTTLES DRAWN AEROBIC AND ANAEROBIC 6CC  Final   Culture PENDING  Incomplete   Report Status PENDING  Incomplete  Culture, blood (routine x 2) Call MD if unable to obtain prior to antibiotics being given     Status: None (Preliminary result)   Collection Time: 08/28/15  5:05 PM  Result Value Ref Range Status   Specimen Description RIGHT ANTECUBITAL  Final   Special Requests BOTTLES DRAWN AEROBIC AND ANAEROBIC [facility]  Final   Culture PENDING  Incomplete   Report Status PENDING  Incomplete     Studies:  Dg Chest 2 View  08/29/2015  CLINICAL DATA:  Pneumonia, history of aortic stenosis EXAM: CHEST  2 VIEW COMPARISON:  08/28/2015 FINDINGS: Cardiomegaly again noted. Atherosclerotic calcifications of thoracic aorta. Central mild vascular congestion and mild perihilar interstitial prominence suspicious for mild interstitial edema. Persistent small bilateral pleural effusion with bilateral basilar atelectasis or infiltrate. Mild degenerative changes thoracic spine. Atherosclerotic calcifications of thoracic aorta. IMPRESSION: Cardiomegaly.Central mild vascular congestion and mild perihilar interstitial  prominence suspicious for mild interstitial edema. Persistent small bilateral pleural effusion with bilateral basilar atelectasis or infiltrate. Electronically Signed   By: Lahoma Crocker M.D.   On: 08/29/2015 10:24   Dg Chest 2 View  08/28/2015  CLINICAL DATA:  Cough.  History of thyroid cancer.  COPD. EXAM: CHEST  2 VIEW COMPARISON:  10/12/2014 FINDINGS: There is hyperinflation of the lungs compatible with COPD. Stable cardiomegaly. Chronic bilateral pleural effusions again noted, stable, right slightly greater than left. Increasing density at the left lung base could reflect left lower lobe pneumonia. Mild cardiomegaly. No overt edema. IMPRESSION: Stable chronic bilateral pleural effusions. COPD.  Cardiomegaly. New airspace opacity at the left base concerning for left lower lobe pneumonia. Electronically Signed   By: Rolm Baptise M.D.   On: 08/28/2015 10:19    Scheduled Meds: . ALPRAZolam  0.5-1 mg Oral QID  . apixaban  5 mg Oral BID  . diltiazem  240 mg Oral QHS  . guaiFENesin  1,200 mg Oral BID  . hydrALAZINE  100 mg Oral TID  . ipratropium-albuterol  3 mL Nebulization Q6H  . lisinopril  40 mg Oral BID  . metoprolol tartrate  12.5 mg Oral BID  . multivitamin  1 tablet Oral QHS  . pantoprazole  40 mg Oral Daily  . polyethylene glycol  17 g Oral Daily  . sevelamer carbonate  2,400 mg Oral TID WC  . thyroid  90 mg Oral Daily  . vitamin B-12  1,000 mcg Oral Daily   Continuous Infusions: . sodium chloride 10 mL/hr (08/30/15 0055)    Active Problems:   Sepsis ([place])   PNA (pneumonia)    Time spent: 25 minutes   Caton Popowski. MD  [facility] Pager [phone]. If 7PM-7AM, please contact night-coverage at [email or web address], password [facility] 08/30/2015, 8:17 AM  LOS: 2 days     By signing my name below, I, Rosalie Doctor, attest that this documentation has been prepared under the direction and in the presence of [facility]. MD Electronically Signed: Rosalie Doctor, Scribe.  08/30/2015 11:42am  I, Dr. Kathie Dike, personally performed the services described in this documentaiton. All medical record entries made by the scribe were at my direction and in my presence. I have reviewed the chart and agree that the record reflects my personal performance and is accurate and complete  Kathie Dike, MD, 08/30/2015 11:52 AM

## 2015-08-30 NOTE — Consult Note (Addendum)
Reason for Consult: end-stage renal disease Referring Physician: Dr. Hilliard Clark Anita [name] is an 65 y.o. female.  HPI: She is a patient with history of hypertension, diabetes, COPD, presently came with complaints of cough and sputum production. Patient also complains of wheezing. She has some difficulty breathing but she states that has not significantly different from her baseline. Her last dialysis was on Monday. Patient states that she is getting dialysis twice a week. Presently she doesn't have any nausea or vomiting.  Past Medical History  Diagnosis Date  . Neuropathy due to secondary diabetes ([place])   . Diabetic nephropathy ([place])   . Hypertension   . Anemia   . Dyslipidemia   . Laryngeal mass   . COPD (chronic obstructive pulmonary disease) ([place])   . GERD (gastroesophageal reflux disease)   . Sleep apnea     Does not use CPAP- due to weight loss  . Atrial fibrillation ([place])     Diagnosed 11/14 of undetermined age of onset    . Diabetes mellitus with end stage renal disease ([place])   . CAD (coronary artery disease) 07/13/2013    Catheterization 5 years ago by Dr. Elisabeth Cara with reportedly nonobstructive disease Calcification noted on CT scan of chest in November of 2014   . ESRD on hemodialysis ([place])   . Chronic diastolic heart failure ([place])   . Aortic stenosis 12/23/2012  . 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 Atherosclerosis of aorta ([place])   . PONV (postoperative nausea and vomiting)   . Shortness of breath dyspnea     with exertion  . Wears glasses   . CHF (congestive heart failure) ([place])   . Thyroid neoplasm     of uncertain behavior  . Diabetic neuropathy [facility])     Past Surgical History  Procedure Laterality Date  . Cholecystectomy    . Appendectomy    . Shoulder arthroscopy Right     w/ repair of rotator cuff repair  . Elbow tendon surgery Right   . Achilles tendon repair Right   . Cesarean section      X 2   . Hemorrhoid surgery      many years ago  . Av  fistula placement Left 12/29/2012    Procedure: ARTERIOVENOUS (AV) FISTULA CREATION;  Surgeon: Elam Dutch, MD;  Location: [facility] OR;  Service: Vascular;  Laterality: Left;  Creation Left Brachial Cephalic Arteriovenous Fistula  . Cardiac catheterization    . Laryngectomy      mass removed- noncancerous  . Lumbar laminectomy      lower back  . Thyroidectomy N/A 10/19/2014    Procedure: TOTAL THYROIDECTOMY;  Surgeon: Armandina Gemma, MD;  Location: [facility] OR;  Service: General;  Laterality: N/A;    Family History  Problem Relation Age of Onset  . COPD Mother   . Thyroid disease Mother   . Multiple sclerosis Father   . Heart disease Father   . Heart attack Father   . Depression Brother     Suicide  . Alcohol abuse Brother   . Heart disease Brother   . Asthma Brother   . Thyroid disease Daughter   . Diabetes Maternal Aunt     Social History:  reports that she has been smoking Cigarettes and E-cigarettes.  She started smoking about 46 years ago. She has a 20 pack-year smoking history. She has never used smokeless tobacco. She reports that she does not drink alcohol or use illicit drugs.  Allergies:  Allergies  Allergen Reactions  . Doxycycline Nausea And Vomiting  . Lipitor [Atorvastatin] Nausea And Vomiting    Medications: I have reviewed the patient's current medications.  Results for orders placed or performed during the hospital encounter of 08/28/15 (from the past 48 hour(s))  CBC with Differential/Platelet     Status: Abnormal   Collection Time: 08/28/15 12:10 PM  Result Value Ref Range   WBC 3.1 (L) 4.0 - 10.5 K/uL   RBC 4.64 3.87 - 5.11 MIL/uL   Hemoglobin 13.1 12.0 - 15.0 g/dL   HCT 41.3 36.0 - 46.0 %   MCV 89.0 78.0 - 100.0 fL   MCH 28.2 26.0 - 34.0 pg   MCHC 31.7 30.0 - 36.0 g/dL   RDW 16.8 (H) 11.5 - 15.5 %   Platelets 138 (L) 150 - 400 K/uL   Neutrophils Relative % 66 %   Neutro Abs 2.0 1.7 - 7.7 K/uL   Lymphocytes Relative 24 %   Lymphs Abs 0.7 0.7 - 4.0 K/uL    Monocytes Relative 9 %   Monocytes Absolute 0.3 0.1 - 1.0 K/uL   Eosinophils Relative 1 %   Eosinophils Absolute 0.0 0.0 - 0.7 K/uL   Basophils Relative 0 %   Basophils Absolute 0.0 0.0 - 0.1 K/uL  Comprehensive metabolic panel     Status: Abnormal   Collection Time: 08/28/15 12:10 PM  Result Value Ref Range   Sodium 134 (L) 135 - 145 mmol/L   Potassium 4.1 3.5 - 5.1 mmol/L   Chloride 93 (L) 101 - 111 mmol/L   CO2 31 22 - 32 mmol/L   Glucose, Bld 95 65 - 99 mg/dL   BUN 19 6 - 20 mg/dL   Creatinine, Ser 3.55 (H) 0.44 - 1.00 mg/dL   Calcium 8.6 (L) 8.9 - 10.3 mg/dL   Total Protein 7.2 6.5 - 8.1 g/dL   Albumin 3.6 3.5 - 5.0 g/dL   AST 24 15 - 41 U/L   ALT 18 14 - 54 U/L   Alkaline Phosphatase 88 38 - 126 U/L   Total Bilirubin 0.7 0.3 - 1.2 mg/dL   GFR calc non Af Amer 12 (L) >60 mL/min   GFR calc Af Amer 14 (L) >60 mL/min    Comment: (NOTE) The eGFR has been calculated using the CKD EPI equation. This calculation has not been validated in all clinical situations. eGFR's persistently <60 mL/min signify possible Chronic Kidney Disease.    Anion gap 10 5 - 15  Troponin I     Status: Abnormal   Collection Time: 08/28/15 12:10 PM  Result Value Ref Range   Troponin I 0.04 (H) <0.031 ng/mL    Comment:        PERSISTENTLY INCREASED TROPONIN VALUES IN THE RANGE OF 0.04-0.49 ng/mL CAN BE SEEN IN:       -UNSTABLE ANGINA       -CONGESTIVE HEART FAILURE       -MYOCARDITIS       -CHEST TRAUMA       -ARRYHTHMIAS       -LATE PRESENTING MYOCARDIAL INFARCTION       -COPD   CLINICAL FOLLOW-UP RECOMMENDED.   Blood gas, arterial     Status: Abnormal   Collection Time: 08/28/15 12:30 PM  Result Value Ref Range   O2 Content 4.0 L/min   Delivery systems NASAL CANNULA    pH, Arterial 7.352 7.350 - 7.450   pCO2 arterial 57.8 (HH) 35.0 - 45.0 mmHg    Comment: CRITICAL RESULT  CALLED TO, READ BACK BY AND VERIFIED WITH: MINTER,R.RN 08/28/15 AT 1249 BY BROADNAX,L.RRT    pO2, Arterial 64.6  (L) 80.0 - 100.0 mmHg   Bicarbonate 28.5 (H) 20.0 - 24.0 mEq/L   Acid-Base Excess 5.8 (H) 0.0 - 2.0 mmol/L   O2 Saturation 90.9 %   Collection site RIGHT RADIAL    Drawn by [id number]    Sample type ARTERIAL    Allens test (pass/fail) PASS PASS  Lactic acid, plasma     Status: None   Collection Time: 08/28/15  2:09 PM  Result Value Ref Range   Lactic Acid, Venous 0.9 0.5 - 2.0 mmol/L  HIV antibody     Status: None   Collection Time: 08/28/15  4:30 PM  Result Value Ref Range   HIV Screen 4th Generation wRfx Non Reactive Non Reactive    Comment: (NOTE) Performed At: [facility] [address] [place], [place] [id number] Mila Homer MD YD:[id number]   Culture, blood (routine x 2) Call MD if unable to obtain prior to antibiotics being given     Status: None (Preliminary result)   Collection Time: 08/28/15  4:35 PM  Result Value Ref Range   Specimen Description BLOOD RIGHT FOREARM    Special Requests BOTTLES DRAWN AEROBIC AND ANAEROBIC 6CC    Culture PENDING    Report Status PENDING   Culture, blood (routine x 2) Call MD if unable to obtain prior to antibiotics being given     Status: None (Preliminary result)   Collection Time: 08/28/15  5:05 PM  Result Value Ref Range   Specimen Description RIGHT ANTECUBITAL    Special Requests BOTTLES DRAWN AEROBIC AND ANAEROBIC 6CC    Culture PENDING    Report Status PENDING   Basic metabolic panel     Status: Abnormal   Collection Time: 08/30/15  5:16 AM  Result Value Ref Range   Sodium 133 (L) 135 - 145 mmol/L   Potassium 4.5 3.5 - 5.1 mmol/L   Chloride 96 (L) 101 - 111 mmol/L   CO2 27 22 - 32 mmol/L   Glucose, Bld 135 (H) 65 - 99 mg/dL   BUN 36 (H) 6 - 20 mg/dL   Creatinine, Ser 7.20 (H) 0.44 - 1.00 mg/dL   Calcium 7.6 (L) 8.9 - 10.3 mg/dL   GFR calc non Af Amer 8 (L) >60 mL/min   GFR calc Af Amer 10 (L) >60 mL/min    Comment: (NOTE) The eGFR has been calculated using the CKD EPI equation. This calculation has not been  validated in all clinical situations. eGFR's persistently <60 mL/min signify possible Chronic Kidney Disease.    Anion gap 10 5 - 15  CBC     Status: Abnormal   Collection Time: 08/30/15  5:16 AM  Result Value Ref Range   WBC 4.0 4.0 - 10.5 K/uL   RBC 4.20 3.87 - 5.11 MIL/uL   Hemoglobin 11.6 (L) 12.0 - 15.0 g/dL   HCT 94.7 09.6 - 28.3 %   MCV 89.0 78.0 - 100.0 fL   MCH 27.6 26.0 - 34.0 pg   MCHC 31.0 30.0 - 36.0 g/dL   RDW 66.2 (H) 94.7 - 65.4 %   Platelets 114 (L) 150 - 400 K/uL    Comment: SPECIMEN CHECKED FOR CLOTS CONSISTENT WITH PREVIOUS RESULT     Dg Chest 2 View  08/29/2015  CLINICAL DATA:  Pneumonia, history of aortic stenosis EXAM: CHEST  2 VIEW COMPARISON:  08/28/2015 FINDINGS: Cardiomegaly again noted. Atherosclerotic  calcifications of thoracic aorta. Central mild vascular congestion and mild perihilar interstitial prominence suspicious for mild interstitial edema. Persistent small bilateral pleural effusion with bilateral basilar atelectasis or infiltrate. Mild degenerative changes thoracic spine. Atherosclerotic calcifications of thoracic aorta. IMPRESSION: Cardiomegaly.Central mild vascular congestion and mild perihilar interstitial prominence suspicious for mild interstitial edema. Persistent small bilateral pleural effusion with bilateral basilar atelectasis or infiltrate. Electronically Signed   By: Lahoma Crocker M.D.   On: 08/29/2015 10:24   Dg Chest 2 View  08/28/2015  CLINICAL DATA:  Cough.  History of thyroid cancer.  COPD. EXAM: CHEST  2 VIEW COMPARISON:  10/12/2014 FINDINGS: There is hyperinflation of the lungs compatible with COPD. Stable cardiomegaly. Chronic bilateral pleural effusions again noted, stable, right slightly greater than left. Increasing density at the left lung base could reflect left lower lobe pneumonia. Mild cardiomegaly. No overt edema. IMPRESSION: Stable chronic bilateral pleural effusions. COPD.  Cardiomegaly. New airspace opacity at the left base  concerning for left lower lobe pneumonia. Electronically Signed   By: Rolm Baptise M.D.   On: 08/28/2015 10:19    Review of Systems  Constitutional: Positive for fever and chills.  HENT: Positive for congestion.   Respiratory: Positive for cough, sputum production and wheezing.   Cardiovascular: Negative for orthopnea and leg swelling.  Gastrointestinal: Negative for nausea, vomiting and abdominal pain.   Blood pressure 127/47, pulse 52, temperature 98.8 F (37.1 C), temperature source Oral, resp. rate 20, height '5\' 6"'$  (1.676 m), weight 154 lb 9.6 oz (70.126 kg), SpO2 96 %. Physical Exam  Constitutional: She is oriented to person, place, and time. No distress.  HENT:  Mouth/Throat: No oropharyngeal exudate.  Neck: No JVD present.  Cardiovascular: Normal rate and regular rhythm.   Murmur heard. Respiratory: She has wheezes. She has rales.  GI: She exhibits no distension. There is no tenderness.  Musculoskeletal: She exhibits no edema.  Neurological: She is alert and oriented to person, place, and time.    Assessment/Plan: Problem #1 difficulty in breathing and cough. These are to be secondary to pneumonia/COPD. Presently she is slightly feeling better. Still she has cough. Problem #2 end-stage renal disease: She is status post hemodialysis on Monday. According to the patient she is due for dialysis tomorrow. Problem #3 hypertension her blood pressure is reasonably controlled Problem #4 history of diabetes Problem #5 anemia: Her hemoglobin is within our target goal Problem #6 Arctic stenosis Problem #7 coronary artery disease. Plan: I discussed with the patient about doing dialysis today if she has difficulty breathing. Patient states that she would like to wait until Friday which is her regular schedule. She states she is feeling  ok. We'll make arrangements for patient to get dialysis tomorrow for 3 hours and 50 minutes. We'll continue his other medication as  before. Konnie Noffsinger S 08/30/2015, 8:20 AM

## 2015-08-30 NOTE — Evaluation (Signed)
Physical Therapy Evaluation Patient Details Name: Anita [name] MRN: [id number] DOB: 1951-07-16 Today's Date: 08/30/2015   History of Present Illness  2-3 week history of feeling weak and uncomfortable did not let anyone know. Presented to ED with oxygen in mid-60s and was admitted to hospital with diagnosis of "sepsis" on PT patient list.   Clinical Impression  Patient found sitting on beside commode; she reported that she was not done on commode but then requested help getting back to bed a few minutes later. Able to perform pull to stand ttransfer for sit to stand with bedrail with min guard, able to maintain standing with distant supervision; stand pivot transfer with min guard tdoay. Patient very fatigued but agreeable to strength testing, which revealed significant functional weakness; also noted significant postural impairment. Bed mobility with Mod(I) today. At one point patient's O2 sats did drop to 87% on [place] but recovered nicely with pursed lip breathing on [place]. Patient's lethargy did limit today's session. At this point recommend skilled nursing at time of discharge due to severe functional limitations found at evaluation however this could possibly change as patient improves medically; clinical social worker notified. Patient left supine in bed with HOB elevated and all needs otherwise met, CNA notified that patient requests more assist with bedside commode.     Follow Up Recommendations SNF    Equipment Recommendations  Standard walker (for energy conservation )    Recommendations for Other Services OT consult     Precautions / Restrictions Precautions Precautions: Fall Restrictions Weight Bearing Restrictions: No      Mobility  Bed Mobility Overal bed mobility: Modified Independent             General bed mobility comments: sit to supine with Mod(I)/distant supervision today   Transfers Overall transfer level: Needs assistance Equipment used: 1 person hand held  assist Transfers: Stand Pivot Transfers;Sit to/from Stand Sit to Stand: Min guard Stand pivot transfers: Min guard       General transfer comment: poor posture throughout   Ambulation/Gait             General Gait Details: did not ambulate today- pateint too fatigued   Stairs            Wheelchair Mobility    Modified Rankin (Stroke Patients Only)       Balance Overall balance assessment: No apparent balance deficits (not formally assessed)                                           Pertinent Vitals/Pain Pain Assessment: 0-10 Pain Score: 0-No pain    Home Living Family/patient expects to be discharged to:: Private residence Living Arrangements: Spouse/significant other Available Help at Discharge: Family Type of Home: House Home Access: Stairs to enter   Technical brewer of Steps: 1 Home Layout: One level Home Equipment: Bedside commode      Prior Function Level of Independence: Independent         Comments: patient reports she was doing everything for herslef; spouse not present to confirm however      Hand Dominance        Extremity/Trunk Assessment   Upper Extremity Assessment: Defer to OT evaluation           Lower Extremity Assessment: Generalized weakness      Cervical / Trunk Assessment: Kyphotic  Communication   Communication: No  difficulties  Cognition Arousal/Alertness: Lethargic Behavior During Therapy: WFL for tasks assessed/performed Overall Cognitive Status: Within Functional Limits for tasks assessed                      General Comments      Exercises        Assessment/Plan    PT Assessment Patient needs continued PT services  PT Diagnosis Difficulty walking;Generalized weakness   PT Problem List Decreased strength;Decreased activity tolerance;Decreased knowledge of use of DME;Decreased safety awareness;Decreased mobility  PT Treatment Interventions Therapeutic  exercise;Gait training;Balance training;Stair training;Neuromuscular re-education;Functional mobility training;Therapeutic activities;Patient/family education   PT Goals (Current goals can be found in the Care Plan section) Acute Rehab PT Goals Patient Stated Goal: to go home and get back to PLOF  PT Goal Formulation: With patient Time For Goal Achievement: 09/13/15 Potential to Achieve Goals: Fair    Frequency Min 3X/week   Barriers to discharge        Co-evaluation               End of Session   Activity Tolerance: Patient limited by lethargy;Patient limited by fatigue Patient left: in bed;with call bell/phone within reach Nurse Communication: Other (comment) (patient requested taht CNA clean up commode )    Functional Assessment Tool Used: Based on skilled clinical assessment of strength, posture, transfers and functional mobility, and functional activity tolerance  Functional Limitation: Mobility: Walking and moving around Mobility: Walking and Moving Around Current Status (Y1950): At least 80 percent but less than 100 percent impaired, limited or restricted Mobility: Walking and Moving Around Goal Status [id number]): At least 60 percent but less than 80 percent impaired, limited or restricted    Time: 1455-1515 PT Time Calculation (min) (ACUTE ONLY): 20 min   Charges:   PT Evaluation $PT Eval Moderate Complexity: 1 Procedure     PT G Codes:   PT G-Codes **NOT FOR INPATIENT CLASS** Functional Assessment Tool Used: Based on skilled clinical assessment of strength, posture, transfers and functional mobility, and functional activity tolerance  Functional Limitation: Mobility: Walking and moving around Mobility: Walking and Moving Around Current Status (T2458): At least 80 percent but less than 100 percent impaired, limited or restricted Mobility: Walking and Moving Around Goal Status [phone]): At least 60 percent but less than 80 percent impaired, limited or restricted     Deniece Ree PT, DPT [phone]

## 2015-08-31 DIAGNOSIS — J9601 Acute respiratory failure with hypoxia: Secondary | ICD-10-CM | POA: Diagnosis present

## 2015-08-31 DIAGNOSIS — J189 Pneumonia, unspecified organism: Secondary | ICD-10-CM | POA: Diagnosis present

## 2015-08-31 DIAGNOSIS — R778 Other specified abnormalities of plasma proteins: Secondary | ICD-10-CM | POA: Diagnosis present

## 2015-08-31 DIAGNOSIS — I4891 Unspecified atrial fibrillation: Secondary | ICD-10-CM

## 2015-08-31 DIAGNOSIS — R7989 Other specified abnormal findings of blood chemistry: Secondary | ICD-10-CM

## 2015-08-31 DIAGNOSIS — I10 Essential (primary) hypertension: Secondary | ICD-10-CM | POA: Diagnosis present

## 2015-08-31 LAB — BLOOD GAS, ARTERIAL
ACID-BASE EXCESS: 1.1 mmol/L (ref 0.0–2.0)
Acid-Base Excess: 2.7 mmol/L — ABNORMAL HIGH (ref 0.0–2.0)
BICARBONATE: 25.6 meq/L — AB (ref 20.0–24.0)
Bicarbonate: 24.4 mEq/L — ABNORMAL HIGH (ref 20.0–24.0)
DRAWN BY: 221791
Drawn by: 382351
FIO2: 0.5
O2 CONTENT: 4.5 L/min
O2 SAT: 91.6 %
O2 SAT: 94.3 %
PATIENT TEMPERATURE: 37
PATIENT TEMPERATURE: 37
PO2 ART: 95.6 mmHg (ref 80.0–100.0)
TCO2: 14.7 mmol/L (ref 0–100)
pCO2 arterial: 57 mmHg — ABNORMAL HIGH (ref 35.0–45.0)
pCO2 arterial: 61.2 mmHg (ref 35.0–45.0)
pH, Arterial: 7.296 — ABNORMAL LOW (ref 7.350–7.450)
pH, Arterial: 7.293 — ABNORMAL LOW (ref 7.350–7.450)
pO2, Arterial: 77.2 mmHg — ABNORMAL LOW (ref 80.0–100.0)

## 2015-08-31 LAB — CBC
HCT: 35.2 % — ABNORMAL LOW (ref 36.0–46.0)
HEMOGLOBIN: 11.1 g/dL — AB (ref 12.0–15.0)
MCH: 27.8 pg (ref 26.0–34.0)
MCHC: 31.5 g/dL (ref 30.0–36.0)
MCV: 88.2 fL (ref 78.0–100.0)
Platelets: 110 10*3/uL — ABNORMAL LOW (ref 150–400)
RBC: 3.99 MIL/uL (ref 3.87–5.11)
RDW: 15.6 % — ABNORMAL HIGH (ref 11.5–15.5)
WBC: 5.4 10*3/uL (ref 4.0–10.5)

## 2015-08-31 LAB — BASIC METABOLIC PANEL
ANION GAP: 9 (ref 5–15)
BUN: 49 mg/dL — ABNORMAL HIGH (ref 6–20)
CALCIUM: 7.4 mg/dL — AB (ref 8.9–10.3)
CO2: 28 mmol/L (ref 22–32)
Chloride: 97 mmol/L — ABNORMAL LOW (ref 101–111)
Creatinine, Ser: 5.54 mg/dL — ABNORMAL HIGH (ref 0.44–1.00)
GFR, EST AFRICAN AMERICAN: 8 mL/min — AB (ref >60)
GFR, EST NON AFRICAN AMERICAN: 7 mL/min — AB (ref >60)
Glucose, Bld: 96 mg/dL (ref 65–99)
Potassium: 4.7 mmol/L (ref 3.5–5.1)
Sodium: 134 mmol/L — ABNORMAL LOW (ref 135–145)

## 2015-08-31 MED ORDER — LIDOCAINE HCL (PF) 1 % IJ SOLN: 5.0000 mL | INTRAMUSCULAR | Status: DC | PRN

## 2015-08-31 MED ORDER — PENTAFLUOROPROP-TETRAFLUOROETH EX AERO: 1.0000 "application " | INHALATION_SPRAY | CUTANEOUS | Status: DC | PRN

## 2015-08-31 MED ORDER — METHYLPREDNISOLONE SODIUM SUCC 125 MG IJ SOLR
60.0000 mg | Freq: Two times a day (BID) | INTRAMUSCULAR | Status: DC
  Administered 2015-08-31 – 2015-09-03 (×6): 60 mg via INTRAVENOUS
  Filled 2015-08-31 (×6): qty 2

## 2015-08-31 MED ORDER — SODIUM CHLORIDE 0.9 % IV SOLN: 100.0000 mL | INTRAVENOUS | Status: DC | PRN

## 2015-08-31 MED ORDER — DEXTROSE 5 % IV SOLN
2.0000 g | Freq: Once | INTRAVENOUS | Status: AC
  Administered 2015-08-31: 2 g via INTRAVENOUS
  Filled 2015-08-31: qty 2

## 2015-08-31 MED ORDER — VANCOMYCIN HCL IN DEXTROSE 750-5 MG/150ML-% IV SOLN
750.0000 mg | Freq: Once | INTRAVENOUS | Status: AC
  Administered 2015-08-31: 750 mg via INTRAVENOUS
  Filled 2015-08-31: qty 150

## 2015-08-31 MED ORDER — ALPRAZOLAM 0.5 MG PO TABS
0.5000 mg | ORAL_TABLET | Freq: Four times a day (QID) | ORAL | Status: DC | PRN
  Administered 2015-09-03: 1 mg via ORAL
  Filled 2015-08-31 (×2): qty 2

## 2015-08-31 MED ORDER — LIDOCAINE-PRILOCAINE 2.5-2.5 % EX CREA: 1.0000 "application " | TOPICAL_CREAM | CUTANEOUS | Status: DC | PRN

## 2015-08-31 NOTE — Progress Notes (Addendum)
Subjective: Patient is still she has some cough but nonproductive. She has also some difficulty breathing but feels much better. Presently she denies any nausea or vomiting.   Objective: Vital signs in last 24 hours: Temp:  [98.5 F (36.9 C)-99 F (37.2 C)] 98.7 F (37.1 C) (01/13 0317) Pulse Rate:  [55-82] 72 (01/13 0317) Resp:  [20] 20 (01/13 0317) BP: (153-168)/(55-68) 155/59 mmHg (01/13 0317) SpO2:  [91 %-96 %] 92 % (01/13 0317) Weight:  [158 lb (71.668 kg)] 158 lb (71.668 kg) (01/13 0317)  Intake/Output from previous day: 01/12 0701 - 01/13 0700 In: 840 [P.O.:840] Out: 100 [Urine:100] Intake/Output this shift:     Recent Labs  08/28/15 1210 08/30/15 0516 08/31/15 0519  HGB 13.1 11.6* 11.1*    Recent Labs  08/30/15 0516 08/31/15 0519  WBC 4.0 5.4  RBC 4.20 3.99  HCT 37.4 35.2*  PLT 114* 110*    Recent Labs  08/30/15 0516 08/31/15 0519  NA 133* 134*  K 4.5 4.7  CL 96* 97*  CO2 27 28  BUN 36* 49*  CREATININE 4.86* 5.54*  GLUCOSE 135* 96  CALCIUM 7.6* 7.4*   No results for input(s): LABPT, INR in the last 72 hours.  Generally patient is alert and in no apparent distress. Chest: She has bilateral expiratory wheezing. Heart exam revealed regular rate and rhythm III/VI Systolic murmur Extremities no edema  Assessment/Plan: Problem #1. Difficulty in breathing. Presently seems to be improving. Patient presently her febrile and her white blood cell count is normal. She has history of COPD. Problem #2 end-stage renal disease: She is status post hemodialysis on Monday. Presently patient doesn't have any uremic sinus symptoms. Her potassium is normal Problem #3 anemia: Her hemoglobin is within our target goal Problem #4 history of diabetes Problem #5 hypertension: Her blood pressure is reasonably controlled Problem #6 history of coronary artery disease Problem #7 history of Aortic stenosis Plan: We'll make arrangements for patient to get dialysis  today. If patient is going to be discharged her next dialysis will be on Monday which could be done as an outpatient.   Kiptyn Rafuse S 08/31/2015, 8:17 AM

## 2015-08-31 NOTE — Clinical Social Work Note (Signed)
CSW attempted to meet with pt again, but pt sleeping soundly. Attempts to reach husband to complete assessment unsuccessful. Pt faxed out per note/permission from family.   Benay Pike, [place]

## 2015-08-31 NOTE — Procedures (Signed)
   HEMODIALYSIS TREATMENT NOTE:  3 hour heparin-free dialysis completed via left upper arm AVF (15g/buttonhole). Goal met: 2 liters removed without interruption in ultrafiltration. Pt became restless about 2.5 hours into treatment, called for assistance to reposition frequently, sit up, stretch legs.  "I get restless after a while."  Alprazolam given at 1800, pt skipped 1400 dose. Pt requested to end HD session 10 minutes early; waiver signed. Lethargic pre-HD, alert now. Vancomycin and Cefepime also given at end of HD. All blood was returned and hemostasis was achieved within 10 minutes. Report given to [place], Therapist, sports.  Rockwell [place], RN, CDN

## 2015-08-31 NOTE — NC FL2 (Signed)
[place] LEVEL OF CARE SCREENING TOOL     IDENTIFICATION  Patient Name: Anita [name] Birthdate: 1951/03/22 Sex: female Admission Date (Current Location): 08/28/2015  [facility] and [place] Number:  [employer] and Address:  [address] [address] [place]      Provider Number: [id number]  Attending Physician Name and Address:  Kathie Dike, MD  Relative Name and Phone Number:       Current Level of Care: Hospital Recommended Level of Care: [place] Prior Approval Number:    Date Approved/Denied:   PASRR Number: [id number]  Discharge Plan: SNF    Current Diagnoses: Patient Active Problem List   Diagnosis Date Noted  . Sepsis ([place]) 08/28/2015  . PNA (pneumonia) 08/28/2015  . 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 Atrial fibrillation ([place]) 02/27/2014  . Right foot injury 09/07/2013  . CAD (coronary artery disease) 07/13/2013  . Protein-calorie malnutrition, severe ([place]) 07/13/2013  . Episodic atrial fibrillation ([place])   . Diabetes mellitus with end stage renal disease ([place])   . 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 Atherosclerosis of aorta ([place])   . ESRD on hemodialysis ([place])   . Chronic diastolic heart failure ([place])   . Diabetic nephropathy ([place])   . Neuropathy due to secondary diabetes ([place])   . Long-term (current) use of anticoagulants   . Edema 03/21/2013  . Aortic stenosis 12/23/2012  . Obesity, unspecified 10/22/2011  . Secondary hyperparathyroidism, non-renal ([place]) 07/05/2009    Orientation RESPIRATION BLADDER Height & Weight    Self, Time, Situation, Place  O2 (3L) Continent 5\' 6"  (167.6 cm) 158 lbs.  BEHAVIORAL SYMPTOMS/MOOD NEUROLOGICAL BOWEL NUTRITION STATUS  Other (Comment) (n/a)  (n/a) Continent Diet (Renal/carb modified with fluid restriction)  AMBULATORY STATUS COMMUNICATION OF NEEDS Skin   Total Care Verbally Bruising                        Personal Care Assistance Level of Assistance  Bathing, Dressing, Feeding Bathing Assistance: Maximum assistance Feeding assistance: Limited assistance Dressing Assistance: Maximum assistance     Functional Limitations Info  Sight, Hearing, Speech Sight Info: Adequate Hearing Info: Adequate Speech Info: Adequate    SPECIAL CARE FACTORS FREQUENCY  PT (By licensed PT)     PT Frequency: 5              Contractures      Additional Factors Info  Psychotropic Code Status Info: DNR Allergies Info: Doxycycline, Lipitor Psychotropic Info: Xanax         Current Medications (08/31/2015):  This is the current hospital active medication list Current Facility-Administered Medications  Medication Dose Route Frequency Provider Last Rate Last Dose  . 0.9 %  sodium chloride infusion   Intravenous Continuous Rhetta Mura Schorr, NP 10 mL/hr at 08/30/15 0055 10 mL/hr at 08/30/15 0055  . ALPRAZolam Duanne Moron) tablet 0.5-1 mg  0.5-1 mg Oral QID Nita Sells, MD   1 mg at 08/30/15 2329  . apixaban (ELIQUIS) tablet 5 mg  5 mg Oral BID Nita Sells, MD   5 mg at 08/30/15 2331  . benzonatate (TESSALON) capsule 200 mg  200 mg Oral TID PRN Kathie Dike, MD      . calcium carbonate (TUMS - dosed in mg elemental calcium) chewable tablet 200 mg of elemental calcium  1 tablet Oral BID PRN Nita Sells, MD      . ceFEPIme (MAXIPIME)  2 g in dextrose 5 % 50 mL IVPB  2 g Intravenous Q dialysis Nita Sells, MD      . diltiazem (CARDIZEM CD) 24 hr capsule 240 mg  240 mg Oral QHS Nita Sells, MD   240 mg at 08/30/15 2332  . guaiFENesin (MUCINEX) 12 hr tablet 1,200 mg  1,200 mg Oral BID Kathie Dike, MD   1,200 mg at 08/30/15 0800  . hydrALAZINE (APRESOLINE) tablet 100 mg  100 mg Oral TID Nita Sells, MD   100 mg at 08/30/15 2332  . HYDROcodone-acetaminophen (NORCO) 7.5-325 MG per tablet 1 tablet  1 tablet Oral Q6H PRN Nita Sells, MD      .  ipratropium-albuterol (DUONEB) 0.5-2.5 (3) MG/3ML nebulizer solution 3 mL  3 mL Nebulization Q6H Kathie Dike, MD   3 mL at 08/31/15 0235  . lisinopril (PRINIVIL,ZESTRIL) tablet 40 mg  40 mg Oral BID Nita Sells, MD   40 mg at 08/30/15 2331  . meclizine (ANTIVERT) tablet 25 mg  25 mg Oral QID PRN Nita Sells, MD      . metoprolol tartrate (LOPRESSOR) tablet 12.5 mg  12.5 mg Oral BID Nita Sells, MD   12.5 mg at 08/30/15 2330  . multivitamin (RENA-VIT) tablet 1 tablet  1 tablet Oral QHS Nita Sells, MD   1 tablet at 08/30/15 2329  . ondansetron (ZOFRAN) tablet 4 mg  4 mg Oral Q8H PRN Nita Sells, MD      . oxyCODONE (Oxy IR/ROXICODONE) immediate release tablet 5-10 mg  5-10 mg Oral Q6H PRN Nita Sells, MD      . pantoprazole (PROTONIX) EC tablet 40 mg  40 mg Oral Daily Nita Sells, MD   40 mg at 08/30/15 0802  . polyethylene glycol (MIRALAX / GLYCOLAX) packet 17 g  17 g Oral Daily Nita Sells, MD   17 g at 08/29/15 0847  . sevelamer carbonate (RENVELA) tablet 2,400 mg  2,400 mg Oral TID WC Nita Sells, MD   2,400 mg at 08/30/15 1657  . thyroid (ARMOUR) tablet 90 mg  90 mg Oral Daily Nita Sells, MD   90 mg at 08/29/15 0848  . vancomycin (VANCOCIN) IVPB 750 mg/150 ml premix  750 mg Intravenous Q dialysis Nita Sells, MD      . vitamin B-12 (CYANOCOBALAMIN) tablet 1,000 mcg  1,000 mcg Oral Daily Nita Sells, MD   1,000 mcg at 08/30/15 0804  . zolpidem (AMBIEN) tablet 5 mg  5 mg Oral QHS PRN Nita Sells, MD   5 mg at 08/30/15 2332     Discharge Medications: Please see discharge summary for a list of discharge medications.  Relevant Imaging Results:  Relevant Lab Results:   Additional Information Dialysis 2x a week. SS#: [id number]  Salome Arnt, [place]

## 2015-08-31 NOTE — Progress Notes (Signed)
Patient's family member had taken patient off O2 to help to the bathroom. O2 sat was 74% on room air upon RT arrival to draw ABG. Patient placed back on nasal cannula at 5 lpm but O2 sats never went above 84%. Patient placed on a 50% Venturi mask. Instructed patient's family to call from now on for help with the bathroom and not to take patient off her O2.

## 2015-08-31 NOTE — Progress Notes (Signed)
[facility] PROGRESS NOTE  Anita [name] [id number] DOB: 02-03-1951 DOA: 08/28/2015 PCP: Robert Bellow, MD  Assessment/Plan: 1. HCAP, continue IV abx. Afebrile, WBC and lactic acid wnl. Continue pulmonary hygiene.  2. Acute hypoxic respiratory failure. She has had an increase in oxygen requirement and is currently requiring 5L [place], will wean as tolerated. She still appears to be SOB and is persistently coughing. Patient has a mild wheeze, she is on bronchodilators but we will add low dose steroids.  3. ESRD, usually dialyzed Mon and Fridays. Nephrology consulted and will arrange for dialysis today. 4. Thyroid CA stage 2, s/p total thyroidectomy.  Follow up with Dr. Harlow Asa.  5. HTN, stable. Continue to monitor 6. Atrial fibrillation, currently in NSR. Continue Eliquis, Cardizem, and Metoprolol. 7. Secondary hyperparathyroidism, continue Renvela and calcium carbonate.  8. Elevated troponin, likely related to demand ischemia. EKG non-acute, denies CP.  9. Anxiety and insomnia, continue Xanax and Ambien.   Code Status: DNR DVT prophylaxis: Eliquis Family Communication: No family at bedside.  Disposition Plan: Discharge to SNF when stable.    Consultants:  Nephrology  PT- SNF  Procedures:    Antibiotics:  Vancomycin 1/10>>  Ceftriaxone 1/10>>  HPI/Subjective: Breathing is better but still feels SOB. Has a mild nonproductive cough that is improving.   Objective: Filed Vitals:   08/30/15 2121 08/31/15 0317  BP: 168/68 155/59  Pulse: 82 72  Temp: 99 F (37.2 C) 98.7 F (37.1 C)  Resp: 20 20    Intake/Output Summary (Last 24 hours) at 08/31/15 0807 Last data filed at 08/31/15 [id number]  Gross per 24 hour  Intake    480 ml  Output    100 ml  Net    380 ml   Filed Weights   08/29/15 0608 08/30/15 0500 08/31/15 0317  Weight: 67.586 kg (149 lb) 70.126 kg (154 lb 9.6 oz) 71.668 kg (158 lb)    Exam:  General: NAD, looks comfortable Cardiovascular: RRR,  3/6 systolic ejection murmur. Respiratory: Mild wheeze and scattered rhonchi bilaterally Abdomen: soft, non tender, no distention , bowel sounds normal Musculoskeletal: 1+ edema b/l   Data Reviewed: Basic Metabolic Panel:  Recent Labs Lab 08/28/15 1210 08/30/15 0516 08/31/15 0519  NA 134* 133* 134*  K 4.1 4.5 4.7  CL 93* 96* 97*  CO2 31 27 28   GLUCOSE 95 135* 96  BUN 19 36* 49*  CREATININE 3.55* 4.86* 5.54*  CALCIUM 8.6* 7.6* 7.4*   Liver Function Tests:  Recent Labs Lab 08/28/15 1210  AST 24  ALT 18  ALKPHOS 88  BILITOT 0.7  PROT 7.2  ALBUMIN 3.6   CBC:  Recent Labs Lab 08/28/15 1210 08/30/15 0516 08/31/15 0519  WBC 3.1* 4.0 5.4  NEUTROABS 2.0  --   --   HGB 13.1 11.6* 11.1*  HCT 41.3 37.4 35.2*  MCV 89.0 89.0 88.2  PLT 138* 114* 110*   Cardiac Enzymes:  Recent Labs Lab 08/28/15 1210  TROPONINI 0.04*    Recent Results (from the past 240 hour(s))  Culture, blood (routine x 2) Call MD if unable to obtain prior to antibiotics being given     Status: None (Preliminary result)   Collection Time: 08/28/15  4:35 PM  Result Value Ref Range Status   Specimen Description BLOOD RIGHT FOREARM  Final   Special Requests BOTTLES DRAWN AEROBIC AND ANAEROBIC 6CC  Final   Culture NO GROWTH 3 DAYS  Final   Report Status PENDING  Incomplete  Culture, blood (routine x 2)  Call MD if unable to obtain prior to antibiotics being given     Status: None (Preliminary result)   Collection Time: 08/28/15  5:05 PM  Result Value Ref Range Status   Specimen Description RIGHT ANTECUBITAL  Final   Special Requests BOTTLES DRAWN AEROBIC AND ANAEROBIC 6CC  Final   Culture NO GROWTH 3 DAYS  Final   Report Status PENDING  Incomplete  MRSA PCR Screening     Status: None   Collection Time: 08/30/15 11:51 AM  Result Value Ref Range Status   MRSA by PCR NEGATIVE NEGATIVE Final    Comment:        The GeneXpert MRSA Assay (FDA approved for NASAL specimens only), is one component of  a comprehensive MRSA colonization surveillance program. It is not intended to diagnose MRSA infection nor to guide or monitor treatment for MRSA infections.      Studies: Dg Chest 2 View  08/29/2015  CLINICAL DATA:  Pneumonia, history of aortic stenosis EXAM: CHEST  2 VIEW COMPARISON:  08/28/2015 FINDINGS: Cardiomegaly again noted. Atherosclerotic calcifications of thoracic aorta. Central mild vascular congestion and mild perihilar interstitial prominence suspicious for mild interstitial edema. Persistent small bilateral pleural effusion with bilateral basilar atelectasis or infiltrate. Mild degenerative changes thoracic spine. Atherosclerotic calcifications of thoracic aorta. IMPRESSION: Cardiomegaly.Central mild vascular congestion and mild perihilar interstitial prominence suspicious for mild interstitial edema. Persistent small bilateral pleural effusion with bilateral basilar atelectasis or infiltrate. Electronically Signed   By: Lahoma Crocker M.D.   On: 08/29/2015 10:24    Scheduled Meds: . ALPRAZolam  0.5-1 mg Oral QID  . apixaban  5 mg Oral BID  . diltiazem  240 mg Oral QHS  . guaiFENesin  1,200 mg Oral BID  . hydrALAZINE  100 mg Oral TID  . ipratropium-albuterol  3 mL Nebulization Q6H  . lisinopril  40 mg Oral BID  . metoprolol tartrate  12.5 mg Oral BID  . multivitamin  1 tablet Oral QHS  . pantoprazole  40 mg Oral Daily  . polyethylene glycol  17 g Oral Daily  . sevelamer carbonate  2,400 mg Oral TID WC  . thyroid  90 mg Oral Daily  . vitamin B-12  1,000 mcg Oral Daily   Continuous Infusions: . sodium chloride 10 mL/hr (08/30/15 0055)    Active Problems:   Sepsis ([place])   PNA (pneumonia)    Time spent: 25 minutes   Malick Netz. MD  [facility] Pager [phone]. If 7PM-7AM, please contact night-coverage at [email or web address], password [facility] 08/31/2015, 8:07 AM  LOS: 3 days     By signing my name below, I, Rosalie Doctor, attest that this documentation has  been prepared under the direction and in the presence of [facility]. MD Electronically Signed: Rosalie Doctor, Scribe. 08/31/2015  1:02pm

## 2015-08-31 NOTE — Clinical Social Work Note (Signed)
CSW spoke with patient's granddaughter via phone, who as at bedside with patient.  Patient and granddaugter both agreed that patient wanted to be in [facility].   CSW sent clinicals to SNF facilities in [place].  Ihor Gully, [place]

## 2015-08-31 NOTE — Care Management Important Message (Signed)
Important Message  Patient Details  Name: Anita [name] MRN: [id number] Date of Birth: [birth year]   Medicare Important Message Given:  Yes    Sherald Barge, RN 08/31/2015, 2:49 PM

## 2015-08-31 NOTE — Clinical Social Work Placement (Signed)
   CLINICAL SOCIAL WORK PLACEMENT  NOTE  Date:  08/31/2015  Patient Details  Name: ADILEE CARDINAL MRN: [id number] Date of Birth: 06-07-1951  Clinical Social Work is seeking post-discharge placement for this patient at the [place] level of care (*CSW will initial, date and re-position this form in  chart as items are completed):  Yes   Patient/family provided with [place] Work Department's list of facilities offering this level of care within the geographic area requested by the patient (or if unable, by the patient's family).  Yes   Patient/family informed of their freedom to choose among providers that offer the needed level of care, that participate in Medicare, Medicaid or managed care program needed by the patient, have an available bed and are willing to accept the patient.  Yes   Patient/family informed of [facility]'s ownership interest in [facility] and [facility], as well as of the fact that they are under no obligation to receive care at these facilities.  PASRR submitted to EDS on 08/31/15     PASRR number received on 08/31/15     Existing PASRR number confirmed on       FL2 transmitted to all facilities in geographic area requested by pt/family on 08/31/15     FL2 transmitted to all facilities within larger geographic area on       Patient informed that his/her managed care company has contracts with or will negotiate with certain facilities, including the following:            Patient/family informed of bed offers received.  Patient chooses bed at       Physician recommends and patient chooses bed at      Patient to be transferred to   on  .  Patient to be transferred to facility by       Patient family notified on   of transfer.  Name of family member notified:        PHYSICIAN       Additional Comment:    _______________________________________________ Salome Arnt, [place] 08/31/2015, 1:53  PM [phone]

## 2015-09-01 ENCOUNTER — Inpatient Hospital Stay (HOSPITAL_COMMUNITY): Payer: Medicare Other

## 2015-09-01 LAB — CBC
HEMATOCRIT: 38 % (ref 36.0–46.0)
HEMOGLOBIN: 12.2 g/dL (ref 12.0–15.0)
MCH: 28 pg (ref 26.0–34.0)
MCHC: 32.1 g/dL (ref 30.0–36.0)
MCV: 87.2 fL (ref 78.0–100.0)
Platelets: 112 10*3/uL — ABNORMAL LOW (ref 150–400)
RBC: 4.36 MIL/uL (ref 3.87–5.11)
RDW: 15.3 % (ref 11.5–15.5)
WBC: 2.2 10*3/uL — AB (ref 4.0–10.5)

## 2015-09-01 LAB — BASIC METABOLIC PANEL
ANION GAP: 8 (ref 5–15)
BUN: 36 mg/dL — ABNORMAL HIGH (ref 6–20)
CHLORIDE: 99 mmol/L — AB (ref 101–111)
CO2: 28 mmol/L (ref 22–32)
CREATININE: 4.23 mg/dL — AB (ref 0.44–1.00)
Calcium: 7.8 mg/dL — ABNORMAL LOW (ref 8.9–10.3)
GFR calc non Af Amer: 10 mL/min — ABNORMAL LOW (ref >60)
GFR, EST AFRICAN AMERICAN: 12 mL/min — AB (ref >60)
Glucose, Bld: 179 mg/dL — ABNORMAL HIGH (ref 65–99)
Potassium: 4.3 mmol/L (ref 3.5–5.1)
SODIUM: 135 mmol/L (ref 135–145)

## 2015-09-01 MED ORDER — DEXTROSE 5 % IV SOLN: 2.0000 g | INTRAVENOUS | Status: DC

## 2015-09-01 MED ORDER — IPRATROPIUM-ALBUTEROL 0.5-2.5 (3) MG/3ML IN SOLN
3.0000 mL | Freq: Three times a day (TID) | RESPIRATORY_TRACT | Status: DC
  Administered 2015-09-02 – 2015-09-03 (×4): 3 mL via RESPIRATORY_TRACT
  Filled 2015-09-01 (×4): qty 3

## 2015-09-01 MED ORDER — VANCOMYCIN HCL IN DEXTROSE 750-5 MG/150ML-% IV SOLN: 750.0000 mg | INTRAVENOUS | Status: DC

## 2015-09-01 NOTE — Progress Notes (Signed)
ANTIBIOTIC CONSULT NOTE - follow up  Pharmacy Consult for Vancomycin and Cefepime Indication: Pneumonia  Allergies  Allergen Reactions  . Doxycycline Nausea And Vomiting  . Lipitor [Atorvastatin] Nausea And Vomiting   Patient Measurements: Height: 5\' 6"  (167.6 cm) Weight: 156 lb 8 oz (70.988 kg) IBW/kg (Calculated) : 59.3  Vital Signs: Temp: 98 F (36.7 C) (01/14 0600) Temp Source: Oral (01/14 0600) BP: 147/66 mmHg (01/14 0600) Pulse Rate: 67 (01/14 0600) Intake/Output from previous day: 01/13 0701 - 01/14 0700 In: 240 [P.O.:240] Out: 2000  Intake/Output from this shift: Total I/O In: 240 [P.O.:240] Out: -   Labs:  Recent Labs  08/30/15 0516 08/31/15 0519 09/01/15 0614  WBC 4.0 5.4 2.2*  HGB 11.6* 11.1* 12.2  PLT 114* 110* 112*  CREATININE 4.86* 5.54* 4.23*   Estimated Creatinine Clearance: 12.2 mL/min (by C-G formula based on Cr of 4.23). No results for input(s): VANCOTROUGH, VANCOPEAK, VANCORANDOM, GENTTROUGH, GENTPEAK, GENTRANDOM, TOBRATROUGH, TOBRAPEAK, TOBRARND, AMIKACINPEAK, AMIKACINTROU, AMIKACIN in the last 72 hours.   Microbiology: Recent Results (from the past 720 hour(s))  Culture, blood (routine x 2) Call MD if unable to obtain prior to antibiotics being given     Status: None (Preliminary result)   Collection Time: 08/28/15  4:35 PM  Result Value Ref Range Status   Specimen Description BLOOD RIGHT FOREARM  Final   Special Requests BOTTLES DRAWN AEROBIC AND ANAEROBIC 6CC  Final   Culture NO GROWTH 4 DAYS  Final   Report Status PENDING  Incomplete  Culture, blood (routine x 2) Call MD if unable to obtain prior to antibiotics being given     Status: None (Preliminary result)   Collection Time: 08/28/15  5:05 PM  Result Value Ref Range Status   Specimen Description RIGHT ANTECUBITAL  Final   Special Requests BOTTLES DRAWN AEROBIC AND ANAEROBIC 6CC  Final   Culture NO GROWTH 4 DAYS  Final   Report Status PENDING  Incomplete  MRSA PCR Screening      Status: None   Collection Time: 08/30/15 11:51 AM  Result Value Ref Range Status   MRSA by PCR NEGATIVE NEGATIVE Final    Comment:        The GeneXpert MRSA Assay (FDA approved for NASAL specimens only), is one component of a comprehensive MRSA colonization surveillance program. It is not intended to diagnose MRSA infection nor to guide or monitor treatment for MRSA infections.    Medical History: Past Medical History  Diagnosis Date  . Neuropathy due to secondary diabetes ([place])   . Diabetic nephropathy ([place])   . Hypertension   . Anemia   . Dyslipidemia   . Laryngeal mass   . COPD (chronic obstructive pulmonary disease) ([place])   . GERD (gastroesophageal reflux disease)   . Sleep apnea     Does not use CPAP- due to weight loss  . Atrial fibrillation ([place])     Diagnosed 11/14 of undetermined age of onset    . Diabetes mellitus with end stage renal disease ([place])   . CAD (coronary artery disease) 07/13/2013    Catheterization 5 years ago by Dr. Elisabeth Cara with reportedly nonobstructive disease Calcification noted on CT scan of chest in November of 2014   . ESRD on hemodialysis ([place])   . Chronic diastolic heart failure ([place])   . Aortic stenosis 12/23/2012  . 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 Atherosclerosis of aorta ([place])   . PONV (postoperative nausea and vomiting)   . Shortness of breath  dyspnea     with exertion  . Wears glasses   . CHF (congestive heart failure) ([place])   . Thyroid neoplasm     of uncertain behavior  . Diabetic neuropathy [facility])    Assessment: 65 yo female ESRD and COPD presents with 2-3 week history of not feeling well. At dialysis 1/9, pt required more oxygen and gained weight. Admitted with hypoxic respiratory failure due to PNA. Empiric tx for HCAP. Blood cultures pending.  MRSA PCR (-)   Goal of Therapy:  target pre-HD level 15-45mcg/ml  Plan:  Cefepime 2gm IV after each HD Vancomycin 750mg  IV after every HD  Duration of therapy per MD  (anticipate 8 days) Measure antibiotic drug levels at steady state Follow up culture results  Monitor V/S and labs  Hart Robinsons,  [place] D Clinical Pharmacist 09/01/2015,11:26 AM

## 2015-09-01 NOTE — Progress Notes (Signed)
[facility] PROGRESS NOTE  Anita [name] [place] [id number] DOB: [birth year] DOA: 08/28/2015 PCP: Robert Bellow, MD  Assessment/Plan: 1. HCAP, continue IV abx. Afebrile, WBC and lactic acid wnl. Continue pulmonary hygiene.  2. Acute hypoxic respiratory failure required Venti mask overnight that has now been weaned to 5L [place]. Appears to be breathing comfortably. Encourage pulmonary hygiene. Will repeat CXR today, even though pt has higher O2 requirement today compared to yesterday she seems to be improving.  3. ESRD, usually dialyzed Mon and Fridays. Nephrology consulted and arranged for dialysis 1/13 with 2L removed.  4. Thyroid CA stage 2, s/p total thyroidectomy. Follow up at [facility]. 5. HTN, stable. Continue to monitor 6. Atrial fibrillation, currently in NSR. Continue Eliquis, Cardizem, and Metoprolol. 7. Secondary hyperparathyroidism, continue Renvela and calcium carbonate.  8. Elevated troponin, likely related to demand ischemia. EKG non-acute, denies CP.  9. Anxiety and insomnia, continue Xanax and Ambien.   Code Status: DNR DVT prophylaxis: Eliquis Family Communication: No family at bedside.  Disposition Plan: PT has recommended SNF, patient has refused and wishes to return home.     Consultants:  Nephrology  PT- SNF  Procedures:    Antibiotics:  Vancomycin 1/10>>1/13  Ceftriaxone 1/10>>  HPI/Subjective: Complains of hospital diet and hunger. Breathing is improving.   Objective: Filed Vitals:   08/31/15 2034 09/01/15 0600  BP:  147/66  Pulse: 72 67  Temp:  98 F (36.7 C)  Resp: 18 18    Intake/Output Summary (Last 24 hours) at 09/01/15 0746 Last data filed at 08/31/15 1833  Gross per 24 hour  Intake    240 ml  Output   2000 ml  Net  -1760 ml   Filed Weights   08/31/15 0317 08/31/15 1525 09/01/15 0500  Weight: 71.668 kg (158 lb) 72.2 kg (159 lb 2.8 oz) 70.988 kg (156 lb 8 oz)    Exam:  General: NAD, looks comfortable Cardiovascular:  RRR, S1, S2  Respiratory: Occasional rhonchi but mostly clear breath sounds.  Abdomen: soft, non tender, no distention , bowel sounds normal Musculoskeletal: No edema b/l   Data Reviewed: Basic Metabolic Panel:  Recent Labs Lab 08/28/15 1210 08/30/15 0516 08/31/15 0519 09/01/15 0614  NA 134* 133* 134* 135  K 4.1 4.5 4.7 4.3  CL 93* 96* 97* 99*  CO2 31 27 28 28   GLUCOSE 95 135* 96 179*  BUN 19 36* 49* 36*  CREATININE 3.55* 4.86* 5.54* 4.23*  CALCIUM 8.6* 7.6* 7.4* 7.8*   Liver Function Tests:  Recent Labs Lab 08/28/15 1210  AST 24  ALT 18  ALKPHOS 88  BILITOT 0.7  PROT 7.2  ALBUMIN 3.6   CBC:  Recent Labs Lab 08/28/15 1210 08/30/15 0516 08/31/15 0519 09/01/15 0614  WBC 3.1* 4.0 5.4 2.2*  NEUTROABS 2.0  --   --   --   HGB 13.1 11.6* 11.1* 12.2  HCT 41.3 37.4 35.2* 38.0  MCV 89.0 89.0 88.2 87.2  PLT 138* 114* 110* PENDING   Cardiac Enzymes:  Recent Labs Lab 08/28/15 1210  TROPONINI 0.04*    Recent Results (from the past 240 hour(s))  Culture, blood (routine x 2) Call MD if unable to obtain prior to antibiotics being given     Status: None (Preliminary result)   Collection Time: 08/28/15  4:35 PM  Result Value Ref Range Status   Specimen Description BLOOD RIGHT FOREARM  Final   Special Requests BOTTLES DRAWN AEROBIC AND ANAEROBIC 6CC  Final   Culture NO GROWTH 3  DAYS  Final   Report Status PENDING  Incomplete  Culture, blood (routine x 2) Call MD if unable to obtain prior to antibiotics being given     Status: None (Preliminary result)   Collection Time: 08/28/15  5:05 PM  Result Value Ref Range Status   Specimen Description RIGHT ANTECUBITAL  Final   Special Requests BOTTLES DRAWN AEROBIC AND ANAEROBIC 6CC  Final   Culture NO GROWTH 3 DAYS  Final   Report Status PENDING  Incomplete  MRSA PCR Screening     Status: None   Collection Time: 08/30/15 11:51 AM  Result Value Ref Range Status   MRSA by PCR NEGATIVE NEGATIVE Final    Comment:         The GeneXpert MRSA Assay (FDA approved for NASAL specimens only), is one component of a comprehensive MRSA colonization surveillance program. It is not intended to diagnose MRSA infection nor to guide or monitor treatment for MRSA infections.      Studies: No results found.  Scheduled Meds: . apixaban  5 mg Oral BID  . diltiazem  240 mg Oral QHS  . guaiFENesin  1,200 mg Oral BID  . hydrALAZINE  100 mg Oral TID  . ipratropium-albuterol  3 mL Nebulization Q6H  . lisinopril  40 mg Oral BID  . methylPREDNISolone (SOLU-MEDROL) injection  60 mg Intravenous Q12H  . metoprolol tartrate  12.5 mg Oral BID  . multivitamin  1 tablet Oral QHS  . pantoprazole  40 mg Oral Daily  . polyethylene glycol  17 g Oral Daily  . sevelamer carbonate  2,400 mg Oral TID WC  . thyroid  90 mg Oral Daily  . vitamin B-12  1,000 mcg Oral Daily   Continuous Infusions: . sodium chloride 10 mL/hr (08/30/15 0055)    Active Problems:   ESRD on hemodialysis (HCC)   Atrial fibrillation ([place])   HCAP (healthcare-associated pneumonia)   Acute respiratory failure with hypoxia (HCC)   Elevated troponin   HTN (hypertension)    Time spent: 25 minutes   Kimila Papaleo. MD  [facility] Pager [phone]. If 7PM-7AM, please contact night-coverage at [email or web address], password [facility] 09/01/2015, 7:46 AM  LOS: 4 days     By signing my name below, I, Rennis Harding, attest that this documentation has been prepared under the direction and in the presence of Kathie Dike, MD. Electronically signed: Rennis Harding, Scribe. 09/01/2015 11:03am    I, Dr. Kathie Dike, personally performed the services described in this documentaiton. All medical record entries made by the scribe were at my direction and in my presence. I have reviewed the chart and agree that the record reflects my personal performance and is accurate and complete  Kathie Dike, MD, 09/01/2015 11:27 AM

## 2015-09-01 NOTE — Progress Notes (Signed)
Subjective: Patient still complains of difficulty breathing and mostly exertional. She has dry cough. She denies any fevers chills or sweating.  Objective: Vital signs in last 24 hours: Temp:  [97.7 F (36.5 C)-98.1 F (36.7 C)] 98 F (36.7 C) (01/14 0600) Pulse Rate:  [58-77] 67 (01/14 0600) Resp:  [18-20] 18 (01/14 0600) BP: (134-156)/(61-78) 147/66 mmHg (01/14 0600) SpO2:  [84 %-99 %] 92 % (01/14 0825) FiO2 (%):  [50 %] 50 % (01/14 0600) Weight:  [156 lb 8 oz (70.988 kg)-159 lb 2.8 oz (72.2 kg)] 156 lb 8 oz (70.988 kg) (01/14 0500)  Intake/Output from previous day: 01/13 0701 - 01/14 0700 In: 240 [P.O.:240] Out: 2000  Intake/Output this shift:     Recent Labs  08/30/15 0516 08/31/15 0519 09/01/15 0614  HGB 11.6* 11.1* 12.2    Recent Labs  08/31/15 0519 09/01/15 0614  WBC 5.4 2.2*  RBC 3.99 4.36  HCT 35.2* 38.0  PLT 110* 112*    Recent Labs  08/31/15 0519 09/01/15 0614  NA 134* 135  K 4.7 4.3  CL 97* 99*  CO2 28 28  BUN 49* 36*  CREATININE 5.54* 4.23*  GLUCOSE 96 179*  CALCIUM 7.4* 7.8*   No results for input(s): LABPT, INR in the last 72 hours.  Generally patient is alert and in no apparent distress. Chest: She has bilateral expiratory wheezing. Heart exam revealed regular rate and rhythm III/VI murmur Extremities no edema  Assessment/Plan: Problem #1. Difficulty in breathing. Patient continue to be on oxygen. She has still difficulty breathing but slightly better. Problem #2 end-stage renal disease: She is status post hemodialysis yesterday. Presently she denies any nausea or vomiting. Problem #3 anemia: Her hemoglobin is within our target goal Problem #4 history of diabetes: Her blood sugar is reasonably controlled Problem #5 hypertension: Her blood pressure is reasonably controlled Problem #6 history of coronary artery disease Problem #7 history of Aortic stenosis Plan: Patient doesn't require dialysis today. If patient is going to be  discharged her next dialysis will be on Monday which could be done as an outpatient.   Adaleen Hulgan S 09/01/2015, 9:19 AM

## 2015-09-02 LAB — BASIC METABOLIC PANEL
Anion gap: 9 (ref 5–15)
BUN: 56 mg/dL — AB (ref 6–20)
CALCIUM: 8.4 mg/dL — AB (ref 8.9–10.3)
CHLORIDE: 98 mmol/L — AB (ref 101–111)
CO2: 27 mmol/L (ref 22–32)
CREATININE: 5.1 mg/dL — AB (ref 0.44–1.00)
GFR, EST AFRICAN AMERICAN: 9 mL/min — AB (ref >60)
GFR, EST NON AFRICAN AMERICAN: 8 mL/min — AB (ref >60)
Glucose, Bld: 158 mg/dL — ABNORMAL HIGH (ref 65–99)
Potassium: 5 mmol/L (ref 3.5–5.1)
SODIUM: 134 mmol/L — AB (ref 135–145)

## 2015-09-02 LAB — CBC
HEMATOCRIT: 36.6 % (ref 36.0–46.0)
HEMOGLOBIN: 12 g/dL (ref 12.0–15.0)
MCH: 28 pg (ref 26.0–34.0)
MCHC: 32.8 g/dL (ref 30.0–36.0)
MCV: 85.5 fL (ref 78.0–100.0)
PLATELETS: 129 10*3/uL — AB (ref 150–400)
RBC: 4.28 MIL/uL (ref 3.87–5.11)
RDW: 15.1 % (ref 11.5–15.5)
WBC: 5.2 10*3/uL (ref 4.0–10.5)

## 2015-09-02 LAB — CULTURE, BLOOD (ROUTINE X 2)
CULTURE: NO GROWTH
Culture: NO GROWTH

## 2015-09-02 MED ORDER — FUROSEMIDE 10 MG/ML IJ SOLN
INTRAMUSCULAR | Status: AC
  Filled 2015-09-02: qty 20

## 2015-09-02 MED ORDER — FUROSEMIDE 10 MG/ML IJ SOLN
120.0000 mg | Freq: Every day | INTRAVENOUS | Status: DC
  Administered 2015-09-02: 120 mg via INTRAVENOUS
  Filled 2015-09-02 (×2): qty 12

## 2015-09-02 MED ORDER — FUROSEMIDE 80 MG PO TABS
80.0000 mg | ORAL_TABLET | Freq: Every day | ORAL | Status: DC
  Administered 2015-09-02: 80 mg via ORAL
  Filled 2015-09-02: qty 1

## 2015-09-02 MED ORDER — LEVOFLOXACIN 500 MG PO TABS: 500.0000 mg | ORAL_TABLET | ORAL | Status: DC

## 2015-09-02 MED ORDER — LEVOFLOXACIN 750 MG PO TABS
750.0000 mg | ORAL_TABLET | Freq: Once | ORAL | Status: AC
  Administered 2015-09-02: 750 mg via ORAL
  Filled 2015-09-02: qty 1

## 2015-09-02 NOTE — Progress Notes (Signed)
[facility] PROGRESS NOTE  Anita [name] [id number] DOB: 09-Oct-1950 DOA: 08/28/2015 PCP: Robert Bellow, MD  Assessment/Plan: 1. HCAP. Afebrile, WBC and lactic acid wnl. Given improvement, will change abx to PO. Continue pulmonary hygiene.  2. Acute hypoxic respiratory failure. Required Venti mask the night of 1/13 that has now been weaned to 5L [place]. Appears to be breathing comfortably. Encourage pulmonary hygiene. Repeat CXR shows persistent bibasilar effusions and pulmonary edema. She may need more frequent dialysis. Will discuss with nephrology. Restart home dose of Lasix.  3. ESRD, usually dialyzed Mon and Fridays. Nephrology consulted and arranged for dialysis 1/13 with 2L removed. She will have dialysis again tomorrow.  4. Thyroid CA stage 2, s/p total thyroidectomy. Follow up at [facility]. 5. HTN, stable. Continue to monitor 6. Atrial fibrillation, currently in NSR. Continue Eliquis, Cardizem, and Metoprolol. 7. Secondary hyperparathyroidism, continue Renvela and calcium carbonate.  8. Elevated troponin, likely related to demand ischemia. EKG non-acute, denies CP.  9. Anxiety and insomnia, continue Xanax and Ambien.   Code Status: DNR DVT prophylaxis: Eliquis Family Communication: No family at bedside.  Disposition Plan: PT has recommended SNF, patient has refused and wishes to return home.     Consultants:  Nephrology  PT- SNF  Procedures:    Antibiotics:  Vancomycin 1/10>>1/13  Ceftriaxone 1/10>>1/15  Levaquin 1/15>>  HPI/Subjective: Feels like her breathing is a little better. Still has a nonproductive cough.   Objective: Filed Vitals:   09/01/15 2105 09/02/15 0503  BP: 157/73 142/67  Pulse: 85 62  Temp: 99.5 F (37.5 C) 97.2 F (36.2 C)  Resp: 20 20    Intake/Output Summary (Last 24 hours) at 09/02/15 0807 Last data filed at 09/01/15 1730  Gross per 24 hour  Intake    480 ml  Output      0 ml  Net    480 ml   Filed Weights    08/31/15 1525 09/01/15 0500 09/02/15 0442  Weight: 72.2 kg (159 lb 2.8 oz) 70.988 kg (156 lb 8 oz) 70.489 kg (155 lb 6.4 oz)    Exam:  General: NAD, looks comfortable Cardiovascular: Irregularly irregular Respiratory: Diminished breath sounds at bases, no wheezing.  Abdomen: soft, non tender, no distention , bowel sounds normal Musculoskeletal: No edema b/l   Data Reviewed: Basic Metabolic Panel:  Recent Labs Lab 08/28/15 1210 08/30/15 0516 08/31/15 0519 09/01/15 0614 09/02/15 0603  NA 134* 133* 134* 135 134*  K 4.1 4.5 4.7 4.3 5.0  CL 93* 96* 97* 99* 98*  CO2 31 27 28 28 27   GLUCOSE 95 135* 96 179* 158*  BUN 19 36* 49* 36* 56*  CREATININE 3.55* 4.86* 5.54* 4.23* 5.10*  CALCIUM 8.6* 7.6* 7.4* 7.8* 8.4*   Liver Function Tests:  Recent Labs Lab 08/28/15 1210  AST 24  ALT 18  ALKPHOS 88  BILITOT 0.7  PROT 7.2  ALBUMIN 3.6   CBC:  Recent Labs Lab 08/28/15 1210 08/30/15 0516 08/31/15 0519 09/01/15 0614 09/02/15 0603  WBC 3.1* 4.0 5.4 2.2* 5.2  NEUTROABS 2.0  --   --   --   --   HGB 13.1 11.6* 11.1* 12.2 12.0  HCT 41.3 37.4 35.2* 38.0 36.6  MCV 89.0 89.0 88.2 87.2 85.5  PLT 138* 114* 110* 112* 129*   Cardiac Enzymes:  Recent Labs Lab 08/28/15 1210  TROPONINI 0.04*    Recent Results (from the past 240 hour(s))  Culture, blood (routine x 2) Call MD if unable to obtain prior to  antibiotics being given     Status: None (Preliminary result)   Collection Time: 08/28/15  4:35 PM  Result Value Ref Range Status   Specimen Description BLOOD RIGHT FOREARM  Final   Special Requests BOTTLES DRAWN AEROBIC AND ANAEROBIC 6CC  Final   Culture NO GROWTH 4 DAYS  Final   Report Status PENDING  Incomplete  Culture, blood (routine x 2) Call MD if unable to obtain prior to antibiotics being given     Status: None (Preliminary result)   Collection Time: 08/28/15  5:05 PM  Result Value Ref Range Status   Specimen Description RIGHT ANTECUBITAL  Final   Special  Requests BOTTLES DRAWN AEROBIC AND ANAEROBIC 6CC  Final   Culture NO GROWTH 4 DAYS  Final   Report Status PENDING  Incomplete  MRSA PCR Screening     Status: None   Collection Time: 08/30/15 11:51 AM  Result Value Ref Range Status   MRSA by PCR NEGATIVE NEGATIVE Final    Comment:        The GeneXpert MRSA Assay (FDA approved for NASAL specimens only), is one component of a comprehensive MRSA colonization surveillance program. It is not intended to diagnose MRSA infection nor to guide or monitor treatment for MRSA infections.      Studies: Dg Chest Port 1 View  09/01/2015  CLINICAL DATA:  Acute respiratory failure.  Diabetes, hypertension EXAM: PORTABLE CHEST 1 VIEW COMPARISON:  08/29/2015 FINDINGS: Stable enlarged cardiac silhouette. There is bibasilar effusions slightly increased compared to prior. Bibasilar airspace disease suggesting edema is not significantly changed. Upper lungs are clear. IMPRESSION: No significant change in bibasilar effusions and bibasilar pulmonary edema. Electronically Signed   By: Suzy Bouchard M.D.   On: 09/01/2015 14:07    Scheduled Meds: . apixaban  5 mg Oral BID  . [START ON 09/03/2015] ceFEPime (MAXIPIME) IV  2 g Intravenous Q M,W,F-HD  . diltiazem  240 mg Oral QHS  . guaiFENesin  1,200 mg Oral BID  . hydrALAZINE  100 mg Oral TID  . ipratropium-albuterol  3 mL Nebulization TID  . lisinopril  40 mg Oral BID  . methylPREDNISolone (SOLU-MEDROL) injection  60 mg Intravenous Q12H  . metoprolol tartrate  12.5 mg Oral BID  . multivitamin  1 tablet Oral QHS  . pantoprazole  40 mg Oral Daily  . polyethylene glycol  17 g Oral Daily  . sevelamer carbonate  2,400 mg Oral TID WC  . thyroid  90 mg Oral Daily  . [START ON 09/03/2015] vancomycin  750 mg Intravenous Q M,W,F-HD  . vitamin B-12  1,000 mcg Oral Daily   Continuous Infusions: . sodium chloride 10 mL/hr (08/30/15 0055)    Active Problems:   ESRD on hemodialysis (HCC)   Atrial fibrillation  ([place])   HCAP (healthcare-associated pneumonia)   Acute respiratory failure with hypoxia (HCC)   Elevated troponin   HTN (hypertension)    Time spent: 25 minutes   Taneah Masri. MD  [facility] Pager [phone]. If 7PM-7AM, please contact night-coverage at [email or web address], password [facility] 09/02/2015, 8:07 AM  LOS: 5 days    By signing my name below, I, Rosalie Doctor, attest that this documentation has been prepared under the direction and in the presence of [facility]. MD Electronically Signed: Rosalie Doctor, Scribe. 09/02/2015 10:27am  I, Dr. Kathie Dike, personally performed the services described in this documentaiton. All medical record entries made by the scribe were at my direction and in my presence. I have reviewed the  chart and agree that the record reflects my personal performance and is accurate and complete  Kathie Dike, MD, 09/02/2015 10:42 AM

## 2015-09-02 NOTE — Progress Notes (Signed)
Subjective: Patient is feeling much better. States she has some cough. No sputum production. Presently she is on oxygen. She denies any nausea or vomiting.  Objective: Vital signs in last 24 hours: Temp:  [97.2 F (36.2 C)-99.5 F (37.5 C)] 97.2 F (36.2 C) (01/15 0503) Pulse Rate:  [62-85] 62 (01/15 0503) Resp:  [18-20] 20 (01/15 0503) BP: (142-157)/(60-73) 142/67 mmHg (01/15 0503) SpO2:  [91 %-97 %] 91 % (01/15 0856) Weight:  [155 lb 6.4 oz (70.489 kg)] 155 lb 6.4 oz (70.489 kg) (01/15 0442)  Intake/Output from previous day: 01/14 0701 - 01/15 0700 In: 480 [P.O.:480] Out: -  Intake/Output this shift: Total I/O In: 240 [P.O.:240] Out: -    Recent Labs  08/31/15 0519 09/01/15 0614 09/02/15 0603  HGB 11.1* 12.2 12.0    Recent Labs  09/01/15 0614 09/02/15 0603  WBC 2.2* 5.2  RBC 4.36 4.28  HCT 38.0 36.6  PLT 112* 129*    Recent Labs  09/01/15 0614 09/02/15 0603  NA 135 134*  K 4.3 5.0  CL 99* 98*  CO2 28 27  BUN 36* 56*  CREATININE 4.23* 5.10*  GLUCOSE 179* 158*  CALCIUM 7.8* 8.4*   No results for input(s): LABPT, INR in the last 72 hours.  Generally patient is alert and in no apparent distress. Chest: Decreased breath sound, she has some scattered rhonchi. Heart exam revealed regular rate and rhythm III/VI Systolic murmur Extremities no edema  Assessment/Plan: Problem #1. Difficulty in breathing. Presently seems to be improving. Patient remained on oxygen. Problem #2 end-stage renal disease: She is status post hemodialysis on Friday. Presently her potassium is good. Patient doesn't have any nausea or vomiting. Problem #3 anemia: Her hemoglobin is within our target goal Problem #4 history of diabetes: Her blood sugar is reasonably controlled Problem #5 hypertension: Her blood pressure is reasonably controlled Problem #6 history of coronary artery disease Problem #7 history of Aortic stenosis Plan: Patient doesn't require any dialysis today 2] R  make arrangements for patient to get dialysis tomorrow.   Athan Casalino S 09/02/2015, 9:58 AM

## 2015-09-03 LAB — BASIC METABOLIC PANEL
Anion gap: 12 (ref 5–15)
BUN: 86 mg/dL — ABNORMAL HIGH (ref 6–20)
CHLORIDE: 95 mmol/L — AB (ref 101–111)
CO2: 26 mmol/L (ref 22–32)
CREATININE: 6.22 mg/dL — AB (ref 0.44–1.00)
Calcium: 8 mg/dL — ABNORMAL LOW (ref 8.9–10.3)
GFR calc non Af Amer: 6 mL/min — ABNORMAL LOW (ref >60)
GFR, EST AFRICAN AMERICAN: 7 mL/min — AB (ref >60)
Glucose, Bld: 327 mg/dL — ABNORMAL HIGH (ref 65–99)
POTASSIUM: 5.2 mmol/L — AB (ref 3.5–5.1)
Sodium: 133 mmol/L — ABNORMAL LOW (ref 135–145)

## 2015-09-03 LAB — CBC
HEMATOCRIT: 32.4 % — AB (ref 36.0–46.0)
HEMOGLOBIN: 10.9 g/dL — AB (ref 12.0–15.0)
MCH: 28.8 pg (ref 26.0–34.0)
MCHC: 33.6 g/dL (ref 30.0–36.0)
MCV: 85.5 fL (ref 78.0–100.0)
Platelets: 133 10*3/uL — ABNORMAL LOW (ref 150–400)
RBC: 3.79 MIL/uL — AB (ref 3.87–5.11)
RDW: 15 % (ref 11.5–15.5)
WBC: 4.8 10*3/uL (ref 4.0–10.5)

## 2015-09-03 MED ORDER — BENZONATATE 200 MG PO CAPS: 200.0000 mg | ORAL_CAPSULE | Freq: Three times a day (TID) | ORAL | Status: DC | PRN

## 2015-09-03 MED ORDER — IPRATROPIUM-ALBUTEROL 0.5-2.5 (3) MG/3ML IN SOLN: 3.0000 mL | RESPIRATORY_TRACT | Status: DC | PRN

## 2015-09-03 MED ORDER — METOPROLOL TARTRATE 25 MG PO TABS: 12.5000 mg | ORAL_TABLET | Freq: Two times a day (BID) | ORAL | Status: DC

## 2015-09-03 MED ORDER — SODIUM CHLORIDE 0.9 % IV SOLN: 100.0000 mL | INTRAVENOUS | Status: DC | PRN

## 2015-09-03 MED ORDER — IPRATROPIUM-ALBUTEROL 0.5-2.5 (3) MG/3ML IN SOLN: 3.0000 mL | Freq: Two times a day (BID) | RESPIRATORY_TRACT | Status: DC

## 2015-09-03 MED ORDER — IPRATROPIUM-ALBUTEROL 0.5-2.5 (3) MG/3ML IN SOLN: 3.0000 mL | RESPIRATORY_TRACT | Status: AC | PRN

## 2015-09-03 MED ORDER — GUAIFENESIN ER 600 MG PO TB12: 600.0000 mg | ORAL_TABLET | Freq: Two times a day (BID) | ORAL | Status: DC

## 2015-09-03 MED ORDER — FUROSEMIDE 80 MG PO TABS: 120.0000 mg | ORAL_TABLET | Freq: Every day | ORAL | Status: DC

## 2015-09-03 NOTE — Progress Notes (Signed)
Pt. O2 sat on room air 76. 02 3L/min O2 sat 91. O2 sat on 4L/min 95. Pt. Ambulated without difficulty with nurse tech. Tolerated well.

## 2015-09-03 NOTE — Care Management (Signed)
Discussed discharge planning with patient and spouse. Patient stated that she is ready to go home. Has ambulated with nurse aid in passage way.  Qualifies for home O2 .  Referal placed with [facility] for O2 and home health Rn and PT. Patient stated that she has several walkers and a lift chair at home. No other CM needs identified.

## 2015-09-03 NOTE — Progress Notes (Signed)
Inpatient Diabetes Program Recommendations  AACE/ADA: New Consensus Statement on Inpatient Glycemic Control (2015)  Target Ranges:  Prepandial:   less than 140 mg/dL      Peak postprandial:   less than 180 mg/dL (1-2 hours)      Critically ill patients:  140 - 180 mg/dL  Results for NASHIYAH, HOFMEYER (MRN [id number]) as of 09/03/2015 15:07  Ref. Range 08/30/2015 05:16 08/31/2015 05:19 09/01/2015 06:14 09/02/2015 06:03 09/03/2015 11:18  Glucose Latest Ref Range: 65-99 mg/dL 135 (H) 96 179 (H) 158 (H) 327 (H)   Review of Glycemic Control  Diabetes history: DM2 Outpatient Diabetes medications: None Current orders for Inpatient glycemic control: None  Inpatient Diabetes Program Recommendations: Correction (SSI): Noted history of DM2 in H&P. Patient is ordered Solumedrol 60 mg Q12H which is contributing to hyperglycemia. Please consider ordering CBGs with Novolog correction scale. HgbA1C: Please consider ordering an A1C to evaluate glycemic control over the past 2-3 months.   Thanks, Barnie Alderman, RN, MSN, CDE Diabetes Coordinator Inpatient Diabetes Program [phone] (Team Pager from [place] to [place]) [phone] ([place] office) [phone] [facility] office) [phone] [facility] office)

## 2015-09-03 NOTE — Progress Notes (Signed)
Subjective: Patient is feeling much better after receiving IV Lasix last night. Presently she denies any difficulty breathing.  Objective: Vital signs in last 24 hours: Temp:  [98.1 F (36.7 C)-98.5 F (36.9 C)] 98.1 F (36.7 C) (01/16 0507) Pulse Rate:  [58-86] 58 (01/16 0507) Resp:  [20] 20 (01/16 0507) BP: (126-153)/(49-72) 126/49 mmHg (01/16 0507) SpO2:  [90 %-97 %] 96 % (01/16 0722) Weight:  [166 lb 14.2 oz (75.7 kg)] 166 lb 14.2 oz (75.7 kg) (01/16 0500)  Intake/Output from previous day: 01/15 0701 - 01/16 0700 In: 782 [P.O.:720; IV Piggyback:62] Out: -  Intake/Output this shift:     Recent Labs  09/01/15 0614 09/02/15 0603  HGB 12.2 12.0    Recent Labs  09/01/15 0614 09/02/15 0603  WBC 2.2* 5.2  RBC 4.36 4.28  HCT 38.0 36.6  PLT 112* 129*    Recent Labs  09/01/15 0614 09/02/15 0603  NA 135 134*  K 4.3 5.0  CL 99* 98*  CO2 28 27  BUN 36* 56*  CREATININE 4.23* 5.10*  GLUCOSE 179* 158*  CALCIUM 7.8* 8.4*   No results for input(s): LABPT, INR in the last 72 hours.  Generally patient is alert and in no apparent distress. Chest: Decreased breath sound. She has expiratory wheezing. Heart exam revealed regular rate and rhythm III/VI Systolic murmur Extremities no edema  Assessment/Plan: Problem #1. Difficulty in breathing. Possibly combination of COPD/pleural effusion and fluid overload. Patient was given IV Lasix with some improvement. Patient states that she is making loss of urine every time she takes Lasix. Problem #2 end-stage renal disease: She is status post hemodialysis on Friday. Patient is asymptomatic. Presently she is due for dialysis today. Problem #3 anemia: Her hemoglobin is within our target goal Problem #4 history of diabetes: Her blood sugar is reasonably controlled Problem #5 hypertension: Her blood pressure is reasonably controlled Problem #6 history of coronary artery disease Problem #7 history of Aortic stenosis Plan: We'll  dialyze patient today 2] we'll remove about 21/2L for blood pressure tolerates 3] patient continued to decline additional dialysis. She feels today's is good enough and she has been doing very well with it. 4] we'll increase her Lasix 120 mg by mouth once a day.   Kelijah Towry S 09/03/2015, 7:43 AM

## 2015-09-03 NOTE — Discharge Summary (Signed)
Physician Discharge Summary  Cyria Vesco Neuner [id number] DOB: 11/16/1950 DOA: 08/28/2015  PCP: Robert Bellow, MD  Admit date: 08/28/2015 Discharge date: 09/03/2015  Time spent: 40 minutes  Recommendations for Outpatient Follow-up:  1. Patient should follow up with her regular dialysis center 2. She has been discharged on supplemental oxygen, which can be weaned as outpatient 3. She has been set up with home health RN, PT   Discharge Diagnoses:  Active Problems:   ESRD on hemodialysis [facility])   Atrial fibrillation (HCC)   HCAP (healthcare-associated pneumonia)   Acute respiratory failure with hypoxia (HCC)   Elevated troponin   HTN (hypertension)   Discharge Condition: improved  Diet recommendation: low salt, low carb  Filed Weights   09/03/15 0500 09/03/15 1110 09/03/15 1450  Weight: 75.7 kg (166 lb 14.2 oz) 75.8 kg (167 lb 1.7 oz) 73 kg (160 lb 15 oz)    History of present illness:  This patient presented to the hospital with a 2-3 week history of feeling weak. She has been ahving some cough with sputum, but no fever or chills. She was seen in the ED and found to be hypoxic in the 60's on RA. Chest xray indicated pneumonia, so she was admitted for further treatment   Hospital Course:  She was admitted to the hospital for acute respiratory failure due to pneumonia. She was treated appropriately with antibiotics and overall shortness of breath and hypoxia has improved. Follow up chest xray indicated evidence of pleural effusions and pulmonary edema. This was likely related to her renal failure and that patient was not getting enough dialysis. Currently she is receiving dialysis 2 days a week. She was advised to increase her frequency of dialysis but she has refused. She wishes to discuss this further with her primary nephrologist. She takes lasix at home and reports good urine output. She will be continued on lasix, but her dose was increased from 80mg  daily to 120mg  daily.  Patient is breathing comfortably on supplemental oxygen and was requesting discharge home. She has been advised to follow up with her primary nephrologist for further management of her volume status  Procedures:    Consultations:  Nephrology  Discharge Exam: Filed Vitals:   09/03/15 1430 09/03/15 1450  BP: 145/64 138/69  Pulse: 77 85  Temp:  98.1 F (36.7 C)  Resp:  20    General: NAD Cardiovascular: S1, S2 RRR Respiratory: CTA B  Discharge Instructions   Discharge Instructions    DME Nebulizer machine    Complete by:  As directed      Diet - low sodium heart healthy    Complete by:  As directed      Increase activity slowly    Complete by:  As directed           Discharge Medication List as of 09/03/2015  5:14 PM    START taking these medications   Details  benzonatate (TESSALON) 200 MG capsule Take 1 capsule (200 mg total) by mouth 3 (three) times daily as needed for cough., Starting 09/03/2015, Until Discontinued, Normal    guaiFENesin (MUCINEX) 600 MG 12 hr tablet Take 1 tablet (600 mg total) by mouth 2 (two) times daily., Starting 09/03/2015, Until Discontinued, Normal    ipratropium-albuterol (DUONEB) 0.5-2.5 (3) MG/3ML SOLN Take 3 mLs by nebulization every 4 (four) hours as needed., Starting 09/03/2015, Until Discontinued, Normal      CONTINUE these medications which have CHANGED   Details  furosemide (LASIX) 80 MG tablet Take  1.5 tablets (120 mg total) by mouth daily. Takes Tues,wed,thurs, Starting 09/03/2015, Until Discontinued, Normal    metoprolol tartrate (LOPRESSOR) 25 MG tablet Take 0.5 tablets (12.5 mg total) by mouth 2 (two) times daily., Starting 09/03/2015, Until Discontinued, Normal      CONTINUE these medications which have NOT CHANGED   Details  ALPRAZolam (XANAX) 1 MG tablet Take 0.5-1 mg by mouth 4 (four) times daily. Patient takes 1/2 tablet 2-3 times daily and 1 tablet at bedtime, Until Discontinued, Historical Med    calcium carbonate  (TUMS - DOSED IN MG ELEMENTAL CALCIUM) 500 MG chewable tablet Chew 1 tablet by mouth 2 (two) times daily as needed for heartburn (Takes with snacks. In place of Renvela)., Until Discontinued, Historical Med    Cyanocobalamin (VITAMIN B 12 PO) Take 1,000 mg by mouth daily., Until Discontinued, Historical Med    diltiazem (TIAZAC) 240 MG 24 hr capsule Take 1 capsule (240 mg total) by mouth at bedtime., Starting 03/13/2015, Until Discontinued, Normal    ELIQUIS 5 MG TABS tablet Take 5 mg by mouth 2 (two) times daily. , Starting 01/01/2014, Until Discontinued, Historical Med    hydrALAZINE (APRESOLINE) 100 MG tablet Take 100 mg by mouth 3 (three) times daily., Until Discontinued, Historical Med    HYDROcodone-acetaminophen (NORCO) 7.5-325 MG per tablet Take 1 tablet by mouth every 6 (six) hours as needed for pain., Starting 02/15/2013, Until Discontinued, Print    lisinopril (PRINIVIL,ZESTRIL) 40 MG tablet Take 40 mg by mouth 2 (two) times daily., Until Discontinued, Historical Med    meclizine (ANTIVERT) 25 MG tablet Take 25 mg by mouth 4 (four) times daily as needed for dizziness., Until Discontinued, Historical Med    multivitamin (RENA-VIT) TABS tablet Take 1 tablet by mouth at bedtime., Starting 07/18/2013, Until Discontinued, Normal    omeprazole (PRILOSEC) 20 MG capsule Take 20 mg by mouth daily as needed (acid reflux). , Until Discontinued, Historical Med    ondansetron (ZOFRAN) 4 MG tablet Take 4 mg by mouth every 8 (eight) hours as needed. , Starting 12/16/2014, Until Discontinued, Historical Med    oxyCODONE (OXY IR/ROXICODONE) 5 MG immediate release tablet Take 1-2 tablets (5-10 mg total) by mouth every 6 (six) hours as needed for moderate pain., Starting 10/20/2014, Until Discontinued, Print    polyethylene glycol (MIRALAX / GLYCOLAX) packet Take 17 g by mouth daily., Until Discontinued, Historical Med    sevelamer carbonate (RENVELA) 800 MG tablet Take 2,400 mg by mouth 3 (three) times  daily with meals. , Until Discontinued, Historical Med    Thyroid (NATURE-THROID) 97.5 MG TABS Take 97.5 mg by mouth daily., Until Discontinued, Historical Med    zolpidem (AMBIEN) 10 MG tablet Take 10 mg by mouth at bedtime as needed for sleep., Until Discontinued, Historical Med       Allergies  Allergen Reactions  . Doxycycline Nausea And Vomiting  . Lipitor [Atorvastatin] Nausea And Vomiting      The results of significant diagnostics from this hospitalization (including imaging, microbiology, ancillary and laboratory) are listed below for reference.    Significant Diagnostic Studies: Dg Chest 2 View  08/29/2015  CLINICAL DATA:  Pneumonia, history of aortic stenosis EXAM: CHEST  2 VIEW COMPARISON:  08/28/2015 FINDINGS: Cardiomegaly again noted. Atherosclerotic calcifications of thoracic aorta. Central mild vascular congestion and mild perihilar interstitial prominence suspicious for mild interstitial edema. Persistent small bilateral pleural effusion with bilateral basilar atelectasis or infiltrate. Mild degenerative changes thoracic spine. Atherosclerotic calcifications of thoracic aorta. IMPRESSION: Cardiomegaly.Central mild vascular  congestion and mild perihilar interstitial prominence suspicious for mild interstitial edema. Persistent small bilateral pleural effusion with bilateral basilar atelectasis or infiltrate. Electronically Signed   By: Lahoma Crocker M.D.   On: 08/29/2015 10:24   Dg Chest 2 View  08/28/2015  CLINICAL DATA:  Cough.  History of thyroid cancer.  COPD. EXAM: CHEST  2 VIEW COMPARISON:  10/12/2014 FINDINGS: There is hyperinflation of the lungs compatible with COPD. Stable cardiomegaly. Chronic bilateral pleural effusions again noted, stable, right slightly greater than left. Increasing density at the left lung base could reflect left lower lobe pneumonia. Mild cardiomegaly. No overt edema. IMPRESSION: Stable chronic bilateral pleural effusions. COPD.  Cardiomegaly. New  airspace opacity at the left base concerning for left lower lobe pneumonia. Electronically Signed   By: Rolm Baptise M.D.   On: 08/28/2015 10:19   Dg Chest Port 1 View  09/01/2015  CLINICAL DATA:  Acute respiratory failure.  Diabetes, hypertension EXAM: PORTABLE CHEST 1 VIEW COMPARISON:  08/29/2015 FINDINGS: Stable enlarged cardiac silhouette. There is bibasilar effusions slightly increased compared to prior. Bibasilar airspace disease suggesting edema is not significantly changed. Upper lungs are clear. IMPRESSION: No significant change in bibasilar effusions and bibasilar pulmonary edema. Electronically Signed   By: Suzy Bouchard M.D.   On: 09/01/2015 14:07    Microbiology: Recent Results (from the past 240 hour(s))  Culture, blood (routine x 2) Call MD if unable to obtain prior to antibiotics being given     Status: None   Collection Time: 08/28/15  4:35 PM  Result Value Ref Range Status   Specimen Description BLOOD RIGHT FOREARM  Final   Special Requests BOTTLES DRAWN AEROBIC AND ANAEROBIC 6CC  Final   Culture NO GROWTH 5 DAYS  Final   Report Status 09/02/2015 FINAL  Final  Culture, blood (routine x 2) Call MD if unable to obtain prior to antibiotics being given     Status: None   Collection Time: 08/28/15  5:05 PM  Result Value Ref Range Status   Specimen Description RIGHT ANTECUBITAL  Final   Special Requests BOTTLES DRAWN AEROBIC AND ANAEROBIC 6CC  Final   Culture NO GROWTH 5 DAYS  Final   Report Status 09/02/2015 FINAL  Final  MRSA PCR Screening     Status: None   Collection Time: 08/30/15 11:51 AM  Result Value Ref Range Status   MRSA by PCR NEGATIVE NEGATIVE Final    Comment:        The GeneXpert MRSA Assay (FDA approved for NASAL specimens only), is one component of a comprehensive MRSA colonization surveillance program. It is not intended to diagnose MRSA infection nor to guide or monitor treatment for MRSA infections.      Labs: Basic Metabolic Panel:  Recent  Labs Lab 08/30/15 0516 08/31/15 0519 09/01/15 0614 09/02/15 0603 09/03/15 1118  NA 133* 134* 135 134* 133*  K 4.5 4.7 4.3 5.0 5.2*  CL 96* 97* 99* 98* 95*  CO2 27 28 28 27 26   GLUCOSE 135* 96 179* 158* 327*  BUN 36* 49* 36* 56* 86*  CREATININE 4.86* 5.54* 4.23* 5.10* 6.22*  CALCIUM 7.6* 7.4* 7.8* 8.4* 8.0*   Liver Function Tests:  Recent Labs Lab 08/28/15 1210  AST 24  ALT 18  ALKPHOS 88  BILITOT 0.7  PROT 7.2  ALBUMIN 3.6   No results for input(s): LIPASE, AMYLASE in the last 168 hours. No results for input(s): AMMONIA in the last 168 hours. CBC:  Recent Labs Lab 08/28/15 1210  08/30/15 0516 08/31/15 0519 09/01/15 0614 09/02/15 0603 09/03/15 1118  WBC 3.1* 4.0 5.4 2.2* 5.2 4.8  NEUTROABS 2.0  --   --   --   --   --   HGB 13.1 11.6* 11.1* 12.2 12.0 10.9*  HCT 41.3 37.4 35.2* 38.0 36.6 32.4*  MCV 89.0 89.0 88.2 87.2 85.5 85.5  PLT 138* 114* 110* 112* 129* 133*   Cardiac Enzymes:  Recent Labs Lab 08/28/15 1210  TROPONINI 0.04*   BNP: BNP (last 3 results) No results for input(s): BNP in the last 8760 hours.  ProBNP (last 3 results) No results for input(s): PROBNP in the last 8760 hours.  CBG: No results for input(s): GLUCAP in the last 168 hours.     SignedKathie Dike MD.  [facility] 09/03/2015, 6:51 PM

## 2015-09-03 NOTE — Procedures (Signed)
   HEMODIALYSIS TREATMENT NOTE:  3.25 hour heparin-free dialysis completed via left upper arm AVF (15g buttonhole ante/retrograde). Goal met: 2.7 liters removed without interruption in ultrafiltration. All blood was returned and hemostasis was achieved within 14 minutes. Report called to Linus Orn, RN.  [place], RN, CDN

## 2015-09-03 NOTE — Progress Notes (Signed)
Dr. Roderic Palau given results of O2 sat on room air and at rest and walking.

## 2015-09-03 NOTE — Care Management (Signed)
Spoke with patient who is from home with husband. Patient stated that her husband is able to help her in the home. Stated that she does not want to go to SNF. Patient requested to use [facility]. May need O2. Nurse will need to qualify.

## 2015-09-03 NOTE — Progress Notes (Signed)
Patient refused lab draw this morning and is requesting that it be done with dialysis.  Will continue to monitor

## 2015-09-03 NOTE — Progress Notes (Signed)
ANTIBIOTIC CONSULT NOTE - RENAL DOSE ADJUSTMENTS - follow up  Pharmacy Consult for Levaquin Indication: Pneumonia  Allergies  Allergen Reactions  . Doxycycline Nausea And Vomiting  . Lipitor [Atorvastatin] Nausea And Vomiting   Patient Measurements: Height: 5\' 6"  (167.6 cm) Weight: 166 lb 14.2 oz (75.7 kg) IBW/kg (Calculated) : 59.3  Vital Signs: Temp: 98.1 F (36.7 C) (01/16 0507) Temp Source: Oral (01/16 0507) BP: 126/49 mmHg (01/16 0507) Pulse Rate: 58 (01/16 0507) Intake/Output from previous day: 01/15 0701 - 01/16 0700 In: 1022 [P.O.:960; IV Piggyback:62] Out: -  Intake/Output from this shift:    Labs:  Recent Labs  09/01/15 0614 09/02/15 0603  WBC 2.2* 5.2  HGB 12.2 12.0  PLT 112* 129*  CREATININE 4.23* 5.10*   Estimated Creatinine Clearance: 11.3 mL/min (by C-G formula based on Cr of 5.1). No results for input(s): VANCOTROUGH, VANCOPEAK, VANCORANDOM, GENTTROUGH, GENTPEAK, GENTRANDOM, TOBRATROUGH, TOBRAPEAK, TOBRARND, AMIKACINPEAK, AMIKACINTROU, AMIKACIN in the last 72 hours.   Microbiology: Recent Results (from the past 720 hour(s))  Culture, blood (routine x 2) Call MD if unable to obtain prior to antibiotics being given     Status: None   Collection Time: 08/28/15  4:35 PM  Result Value Ref Range Status   Specimen Description BLOOD RIGHT FOREARM  Final   Special Requests BOTTLES DRAWN AEROBIC AND ANAEROBIC [place]  Final   Culture NO GROWTH 5 DAYS  Final   Report Status 09/02/2015 FINAL  Final  Culture, blood (routine x 2) Call MD if unable to obtain prior to antibiotics being given     Status: None   Collection Time: 08/28/15  5:05 PM  Result Value Ref Range Status   Specimen Description RIGHT ANTECUBITAL  Final   Special Requests BOTTLES DRAWN AEROBIC AND ANAEROBIC 6CC  Final   Culture NO GROWTH 5 DAYS  Final   Report Status 09/02/2015 FINAL  Final  MRSA PCR Screening     Status: None   Collection Time: 08/30/15 11:51 AM  Result Value Ref Range  Status   MRSA by PCR NEGATIVE NEGATIVE Final    Comment:        The GeneXpert MRSA Assay (FDA approved for NASAL specimens only), is one component of a comprehensive MRSA colonization surveillance program. It is not intended to diagnose MRSA infection nor to guide or monitor treatment for MRSA infections.    Medical History: Past Medical History  Diagnosis Date  . Neuropathy due to secondary diabetes ([place])   . Diabetic nephropathy ([place])   . Hypertension   . Anemia   . Dyslipidemia   . Laryngeal mass   . COPD (chronic obstructive pulmonary disease) ([place])   . GERD (gastroesophageal reflux disease)   . Sleep apnea     Does not use CPAP- due to weight loss  . Atrial fibrillation ([place])     Diagnosed 11/14 of undetermined age of onset    . 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 CAD (coronary artery disease) 07/13/2013    Catheterization 5 years ago by Dr. Elisabeth Cara with reportedly nonobstructive disease Calcification noted on CT scan of chest in November of 2014   . ESRD on hemodialysis ([place])   . Chronic diastolic heart failure ([place])   . Aortic stenosis 12/23/2012  . 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 Atherosclerosis of aorta ([place])   . PONV (postoperative nausea and vomiting)   . Shortness of breath dyspnea     with exertion  . Wears  glasses   . CHF (congestive heart failure) ([place])   . Thyroid neoplasm     of uncertain behavior  . Diabetic neuropathy [facility])    Assessment: 65 yo female ESRD and COPD presents with 2-3 week history of not feeling well. At dialysis 1/9, pt required more oxygen and gained weight. Admitted with hypoxic respiratory failure due to PNA. Empiric tx for HCAP. Blood cultures pending.  MRSA PCR (-).  Antibiotic Rx changed to Levaquin per MD.  Goal of Therapy:  Eradicate infection.  Plan:  Levaquin 500mg  PO q48hrs (give after HD on dialysis days)  Duration of therapy per MD (anticipate 8 days total) Monitor V/S and labs  Hart Robinsons,  [place] D Clinical Pharmacist 09/03/2015,8:47 AM

## 2015-09-03 NOTE — Clinical Social Work Note (Signed)
Pt refusing SNF and states she will return home at d/c. Pt's husband present and agreeable as long as pt can ambulate. She indicates she has been up this weekend. [facility]. CM notified. CSW will sign off, but can be reconsulted if needed.  Benay Pike, [place]

## 2015-09-27 ENCOUNTER — Encounter: Payer: Self-pay | Admitting: Cardiology

## 2015-09-27 ENCOUNTER — Telehealth: Payer: Self-pay | Admitting: Cardiology

## 2015-09-27 ENCOUNTER — Ambulatory Visit (INDEPENDENT_AMBULATORY_CARE_PROVIDER_SITE_OTHER): Payer: Medicare Other | Admitting: Cardiology

## 2015-09-27 VITALS — BP 108/50 | HR 61 | Ht 66.0 in | Wt 145.0 lb

## 2015-09-27 DIAGNOSIS — I10 Essential (primary) hypertension: Secondary | ICD-10-CM

## 2015-09-27 DIAGNOSIS — I6523 Occlusion and stenosis of bilateral carotid arteries: Secondary | ICD-10-CM | POA: Diagnosis not present

## 2015-09-27 DIAGNOSIS — I5032 Chronic diastolic (congestive) heart failure: Secondary | ICD-10-CM | POA: Diagnosis not present

## 2015-09-27 DIAGNOSIS — I4891 Unspecified atrial fibrillation: Secondary | ICD-10-CM | POA: Diagnosis not present

## 2015-09-27 NOTE — Patient Instructions (Signed)
Your physician wants you to follow-up in: 6 Months with Dr. Branch. You will receive a reminder letter in the mail two months in advance. If you don't receive a letter, please call our office to schedule the follow-up appointment.  Your physician recommends that you continue on your current medications as directed. Please refer to the Current Medication list given to you today.  If you need a refill on your cardiac medications before your next appointment, please call your pharmacy.  Thank you for choosing [facility]!   

## 2015-09-27 NOTE — Progress Notes (Signed)
Patient ID: Anita [name], female   DOB: 1951-02-17, 65 y.o.   MRN: [id number]     Clinical Summary Anita [name] is a 65 y.o.female seen today for follow up of the following medical problems.  1. Afib  - just occasional palpitations, short in duration.  - no bleeding troubles on eliquis - amio previously stopped due to thyroid disease  2. HTN  - does not check regularly.  - compliant with meds.     3. Chronic diastolic heart failure  - fluid removal by primarily by dialysis, makes very little urine  - has had some LE edema, went for extra HD session today. She remains on lasix.   4. Non-obstructive CAD - non-obstructive disease by prior cath  - denies any recent chest pain  5. ESRD  - compliant with HD sessions.    6. Carotid stenosis - mild to moderate disease bilaterally, no neuro symptoms  7. Thyroid cancer - followed at [facility].  Past Medical History  Diagnosis Date  . Neuropathy due to secondary diabetes ([place])   . Diabetic nephropathy ([place])   . Hypertension   . Anemia   . Dyslipidemia   . Laryngeal mass   . COPD (chronic obstructive pulmonary disease) ([place])   . GERD (gastroesophageal reflux disease)   . Sleep apnea     Does not use CPAP- due to weight loss  . Atrial fibrillation ([place])     Diagnosed 11/14 of undetermined age of onset    . Diabetes mellitus with end stage renal disease ([place])   . CAD (coronary artery disease) 07/13/2013    Catheterization 5 years ago by Dr. Elisabeth Cara with reportedly nonobstructive disease Calcification noted on CT scan of chest in November of 2014   . ESRD on hemodialysis ([place])   . Chronic diastolic heart failure ([place])   . Aortic stenosis 12/23/2012  . 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 Atherosclerosis of aorta ([place])   . PONV (postoperative nausea and vomiting)   . Shortness of breath dyspnea     with exertion  . Wears glasses   . CHF (congestive heart failure) ([place])   . Thyroid neoplasm     of uncertain  behavior  . Diabetic neuropathy (HCC)      Allergies  Allergen Reactions  . Doxycycline Nausea And Vomiting  . Lipitor [Atorvastatin] Nausea And Vomiting     Current Outpatient Prescriptions  Medication Sig Dispense Refill  . ALPRAZolam (XANAX) 1 MG tablet Take 0.5-1 mg by mouth 4 (four) times daily. Patient takes 1/2 tablet 2-3 times daily and 1 tablet at bedtime    . benzonatate (TESSALON) 200 MG capsule Take 1 capsule (200 mg total) by mouth 3 (three) times daily as needed for cough. 20 capsule 0  . calcium carbonate (TUMS - DOSED IN MG ELEMENTAL CALCIUM) 500 MG chewable tablet Chew 1 tablet by mouth 2 (two) times daily as needed for heartburn (Takes with snacks. In place of Renvela).    . Cyanocobalamin (VITAMIN B 12 PO) Take 1,000 mg by mouth daily.    Marland Kitchen diltiazem (TIAZAC) 240 MG 24 hr capsule Take 1 capsule (240 mg total) by mouth at bedtime. 30 capsule 11  . ELIQUIS 5 MG TABS tablet Take 5 mg by mouth 2 (two) times daily.     . furosemide (LASIX) 80 MG tablet Take 1.5 tablets (120 mg total) by mouth daily. Takes Tues,wed,thurs 60 tablet 0  . guaiFENesin (MUCINEX) 600 MG 12 hr tablet Take  1 tablet (600 mg total) by mouth 2 (two) times daily. 30 tablet 0  . hydrALAZINE (APRESOLINE) 100 MG tablet Take 100 mg by mouth 3 (three) times daily.    Marland Kitchen HYDROcodone-acetaminophen (NORCO) 7.5-325 MG per tablet Take 1 tablet by mouth every 6 (six) hours as needed for pain. 30 tablet 0  . ipratropium-albuterol (DUONEB) 0.5-2.5 (3) MG/3ML SOLN Take 3 mLs by nebulization every 4 (four) hours as needed. 360 mL 0  . lisinopril (PRINIVIL,ZESTRIL) 40 MG tablet Take 40 mg by mouth 2 (two) times daily.    . meclizine (ANTIVERT) 25 MG tablet Take 25 mg by mouth 4 (four) times daily as needed for dizziness.    . metoprolol tartrate (LOPRESSOR) 25 MG tablet Take 0.5 tablets (12.5 mg total) by mouth 2 (two) times daily. 30 tablet 0  . multivitamin (RENA-VIT) TABS tablet Take 1 tablet by mouth at bedtime. 30  tablet 3  . omeprazole (PRILOSEC) 20 MG capsule Take 20 mg by mouth daily as needed (acid reflux).     . ondansetron (ZOFRAN) 4 MG tablet Take 4 mg by mouth every 8 (eight) hours as needed.     Marland Kitchen oxyCODONE (OXY IR/ROXICODONE) 5 MG immediate release tablet Take 1-2 tablets (5-10 mg total) by mouth every 6 (six) hours as needed for moderate pain. 30 tablet 0  . polyethylene glycol (MIRALAX / GLYCOLAX) packet Take 17 g by mouth daily.    . sevelamer carbonate (RENVELA) 800 MG tablet Take 2,400 mg by mouth 3 (three) times daily with meals.     . Thyroid (NATURE-THROID) 97.5 MG TABS Take 97.5 mg by mouth daily.    Marland Kitchen zolpidem (AMBIEN) 10 MG tablet Take 10 mg by mouth at bedtime as needed for sleep.     No current facility-administered medications for this visit.     Past Surgical History  Procedure Laterality Date  . Cholecystectomy    . Appendectomy    . Shoulder arthroscopy Right     w/ repair of rotator cuff repair  . Elbow tendon surgery Right   . Achilles tendon repair Right   . Cesarean section      X 2   . Hemorrhoid surgery      many years ago  . Av fistula placement Left 12/29/2012    Procedure: ARTERIOVENOUS (AV) FISTULA CREATION;  Surgeon: Elam Dutch, MD;  [facility] OR;  Service: Vascular;  Laterality: Left;  Creation Left Brachial Cephalic Arteriovenous Fistula  . Cardiac catheterization    . Laryngectomy      mass removed- noncancerous  . Lumbar laminectomy      lower back  . Thyroidectomy N/A 10/19/2014    Procedure: TOTAL THYROIDECTOMY;  Surgeon: Armandina Gemma, MD;  Location: [place];  Service: General;  Laterality: N/A;     Allergies  Allergen Reactions  . Doxycycline Nausea And Vomiting  . Lipitor [Atorvastatin] Nausea And Vomiting      Family History  Problem Relation Age of Onset  . COPD Mother   . Thyroid disease Mother   . Multiple sclerosis Father   . Heart disease Father   . Heart attack Father   . Depression Brother     Suicide  . Alcohol  abuse Brother   . Heart disease Brother   . Asthma Brother   . Thyroid disease Daughter   . Diabetes Maternal Aunt      Social History Anita [name] reports that she has been smoking Cigarettes and E-cigarettes.  She started smoking  about 46 years ago. She has a 20 pack-year smoking history. She has never used smokeless tobacco. Anita [name] reports that she does not drink alcohol.   Review of Systems CONSTITUTIONAL: No weight loss, fever, chills, weakness or fatigue.  HEENT: Eyes: No visual loss, blurred vision, double vision or yellow sclerae.No hearing loss, sneezing, congestion, runny nose or sore throat.  SKIN: No rash or itching.  CARDIOVASCULAR: per hpi RESPIRATORY: No shortness of breath, cough or sputum.  GASTROINTESTINAL: No anorexia, nausea, vomiting or diarrhea. No abdominal pain or blood.  GENITOURINARY: No burning on urination, no polyuria NEUROLOGICAL: No headache, dizziness, syncope, paralysis, ataxia, numbness or tingling in the extremities. No change in bowel or bladder control.  MUSCULOSKELETAL: No muscle, back pain, joint pain or stiffness.  LYMPHATICS: No enlarged nodes. No history of splenectomy.  PSYCHIATRIC: No history of depression or anxiety.  ENDOCRINOLOGIC: No reports of sweating, cold or heat intolerance. No polyuria or polydipsia.  Marland Kitchen   Physical Examination Filed Vitals:   09/27/15 1105  BP: 108/50  Pulse: 61   Filed Vitals:   09/27/15 1105  Height: 5\' 6"  (1.676 m)  Weight: 145 lb (65.772 kg)    Gen: resting comfortably, no acute distress HEENT: no scleral icterus, pupils equal round and reactive, no palptable cervical adenopathy,  CV: RRR, 3/6 systolic murmur RUSB, no jvD. Bilateral carotid bruits Resp: Clear to auscultation bilaterally GI: abdomen is soft, non-tender, non-distended, normal bowel sounds, no hepatosplenomegaly MSK: extremities are warm, no edema.  Skin: warm, no rash Neuro:  no focal deficits Psych: appropriate  affect   Diagnostic Studies 06/2013 Echo  LVEF 75%, mild AI, mod LAE, mod TR, PASP 51  02/2008 Cath Normal coronaries, normal LV function  09/2013 IMPRESSION: Mild atheromatous calcifications are seen in the right carotid bulb and proximal right internal carotid artery consistent with less than 50% diameter stenosis based on ultrasound and Doppler criteria.  Moderate atherosclerotic calcifications are noted in the middle and distal portion of the left common carotid artery. Mild atherosclerotic calcifications are noted in the left carotid bulb and proximal left internal carotid artery consistent with less than 50% diameter stenosis based on ultrasound and Doppler criteria    Assessment and Plan  1. Afib  - no current symptoms  - now off amio due to her thyroid disease - continue current meds  2. HTN  - at goal, continue current meds   3. Chronic diastolic heart failure  - appears euvolemic today  - fluid removal with HD  4. Non-obstructive CAD  - non-obstructive from prior cath, no current symptoms  - continue risk factor modification  5. Carotid stenosis - moderate bilateral disease by 03/2015 [place], repeat later this       Arnoldo Lenis, M.D.

## 2015-09-27 NOTE — Telephone Encounter (Signed)
Pt has a question about her Diltiazem

## 2015-10-22 ENCOUNTER — Emergency Department (HOSPITAL_COMMUNITY): Payer: Medicare Other

## 2015-10-22 ENCOUNTER — Encounter (HOSPITAL_COMMUNITY): Payer: Self-pay | Admitting: *Deleted

## 2015-10-22 ENCOUNTER — Inpatient Hospital Stay (HOSPITAL_COMMUNITY): Payer: Medicare Other

## 2015-10-22 ENCOUNTER — Inpatient Hospital Stay (HOSPITAL_COMMUNITY)
Admission: EM | Admit: 2015-10-22 | Discharge: 2015-10-24 | DRG: 190 | Disposition: A | Discharge status: Home Health | Payer: Medicare Other | Attending: Family Medicine | Admitting: Family Medicine

## 2015-10-22 DIAGNOSIS — R079 Chest pain, unspecified: Secondary | ICD-10-CM | POA: Diagnosis not present

## 2015-10-22 DIAGNOSIS — I25119 Atherosclerotic heart disease of native coronary artery with unspecified angina pectoris: Secondary | ICD-10-CM | POA: Diagnosis present

## 2015-10-22 DIAGNOSIS — I5032 Chronic diastolic (congestive) heart failure: Secondary | ICD-10-CM | POA: Diagnosis present

## 2015-10-22 DIAGNOSIS — E1122 Type 2 diabetes mellitus with diabetic chronic kidney disease: Secondary | ICD-10-CM | POA: Diagnosis present

## 2015-10-22 DIAGNOSIS — Z7901 Long term (current) use of anticoagulants: Secondary | ICD-10-CM | POA: Diagnosis not present

## 2015-10-22 DIAGNOSIS — R0600 Dyspnea, unspecified: Secondary | ICD-10-CM | POA: Diagnosis not present

## 2015-10-22 DIAGNOSIS — I480 Paroxysmal atrial fibrillation: Secondary | ICD-10-CM | POA: Diagnosis present

## 2015-10-22 DIAGNOSIS — G4730 Sleep apnea, unspecified: Secondary | ICD-10-CM | POA: Diagnosis present

## 2015-10-22 DIAGNOSIS — Z79899 Other long term (current) drug therapy: Secondary | ICD-10-CM | POA: Diagnosis not present

## 2015-10-22 DIAGNOSIS — I132 Hypertensive heart and chronic kidney disease with heart failure and with stage 5 chronic kidney disease, or end stage renal disease: Secondary | ICD-10-CM | POA: Diagnosis present

## 2015-10-22 DIAGNOSIS — E8779 Other fluid overload: Secondary | ICD-10-CM

## 2015-10-22 DIAGNOSIS — Y95 Nosocomial condition: Secondary | ICD-10-CM | POA: Diagnosis present

## 2015-10-22 DIAGNOSIS — R9431 Abnormal electrocardiogram [ECG] [EKG]: Secondary | ICD-10-CM

## 2015-10-22 DIAGNOSIS — R627 Adult failure to thrive: Secondary | ICD-10-CM | POA: Diagnosis present

## 2015-10-22 DIAGNOSIS — Z87891 Personal history of nicotine dependence: Secondary | ICD-10-CM | POA: Diagnosis not present

## 2015-10-22 DIAGNOSIS — R778 Other specified abnormalities of plasma proteins: Secondary | ICD-10-CM | POA: Diagnosis present

## 2015-10-22 DIAGNOSIS — Z85850 Personal history of malignant neoplasm of thyroid: Secondary | ICD-10-CM

## 2015-10-22 DIAGNOSIS — E872 Acidosis: Secondary | ICD-10-CM | POA: Diagnosis present

## 2015-10-22 DIAGNOSIS — D649 Anemia, unspecified: Secondary | ICD-10-CM | POA: Diagnosis present

## 2015-10-22 DIAGNOSIS — Z9981 Dependence on supplemental oxygen: Secondary | ICD-10-CM

## 2015-10-22 DIAGNOSIS — E1140 Type 2 diabetes mellitus with diabetic neuropathy, unspecified: Secondary | ICD-10-CM | POA: Diagnosis present

## 2015-10-22 DIAGNOSIS — R7989 Other specified abnormal findings of blood chemistry: Secondary | ICD-10-CM | POA: Diagnosis not present

## 2015-10-22 DIAGNOSIS — F419 Anxiety disorder, unspecified: Secondary | ICD-10-CM | POA: Diagnosis present

## 2015-10-22 DIAGNOSIS — J440 Chronic obstructive pulmonary disease with acute lower respiratory infection: Principal | ICD-10-CM | POA: Diagnosis present

## 2015-10-22 DIAGNOSIS — F17200 Nicotine dependence, unspecified, uncomplicated: Secondary | ICD-10-CM

## 2015-10-22 DIAGNOSIS — I700 Atherosclerosis of aorta: Secondary | ICD-10-CM | POA: Diagnosis present

## 2015-10-22 DIAGNOSIS — E785 Hyperlipidemia, unspecified: Secondary | ICD-10-CM | POA: Diagnosis present

## 2015-10-22 DIAGNOSIS — E11319 Type 2 diabetes mellitus with unspecified diabetic retinopathy without macular edema: Secondary | ICD-10-CM | POA: Diagnosis present

## 2015-10-22 DIAGNOSIS — E211 Secondary hyperparathyroidism, not elsewhere classified: Secondary | ICD-10-CM | POA: Diagnosis present

## 2015-10-22 DIAGNOSIS — N186 End stage renal disease: Secondary | ICD-10-CM | POA: Diagnosis present

## 2015-10-22 DIAGNOSIS — Z992 Dependence on renal dialysis: Secondary | ICD-10-CM | POA: Diagnosis not present

## 2015-10-22 DIAGNOSIS — R634 Abnormal weight loss: Secondary | ICD-10-CM

## 2015-10-22 DIAGNOSIS — J189 Pneumonia, unspecified organism: Secondary | ICD-10-CM

## 2015-10-22 DIAGNOSIS — I25118 Atherosclerotic heart disease of native coronary artery with other forms of angina pectoris: Secondary | ICD-10-CM | POA: Diagnosis not present

## 2015-10-22 DIAGNOSIS — K219 Gastro-esophageal reflux disease without esophagitis: Secondary | ICD-10-CM | POA: Diagnosis present

## 2015-10-22 DIAGNOSIS — E1121 Type 2 diabetes mellitus with diabetic nephropathy: Secondary | ICD-10-CM | POA: Diagnosis present

## 2015-10-22 DIAGNOSIS — I350 Nonrheumatic aortic (valve) stenosis: Secondary | ICD-10-CM

## 2015-10-22 DIAGNOSIS — I120 Hypertensive chronic kidney disease with stage 5 chronic kidney disease or end stage renal disease: Secondary | ICD-10-CM | POA: Diagnosis present

## 2015-10-22 DIAGNOSIS — R918 Other nonspecific abnormal finding of lung field: Secondary | ICD-10-CM | POA: Diagnosis present

## 2015-10-22 DIAGNOSIS — R0602 Shortness of breath: Secondary | ICD-10-CM

## 2015-10-22 DIAGNOSIS — I10 Essential (primary) hypertension: Secondary | ICD-10-CM | POA: Diagnosis present

## 2015-10-22 LAB — CBC
HEMATOCRIT: 29.3 % — AB (ref 36.0–46.0)
HEMOGLOBIN: 8.7 g/dL — AB (ref 12.0–15.0)
MCH: 27.6 pg (ref 26.0–34.0)
MCHC: 29.7 g/dL — AB (ref 30.0–36.0)
MCV: 93 fL (ref 78.0–100.0)
Platelets: 165 10*3/uL (ref 150–400)
RBC: 3.15 MIL/uL — ABNORMAL LOW (ref 3.87–5.11)
RDW: 16.8 % — AB (ref 11.5–15.5)
WBC: 6.7 10*3/uL (ref 4.0–10.5)

## 2015-10-22 LAB — I-STAT ARTERIAL BLOOD GAS, ED
Acid-Base Excess: 4 mmol/L — ABNORMAL HIGH (ref 0.0–2.0)
Bicarbonate: 30.9 mEq/L — ABNORMAL HIGH (ref 20.0–24.0)
O2 SAT: 97 %
PCO2 ART: 57.1 mmHg — AB (ref 35.0–45.0)
PH ART: 7.341 — AB (ref 7.350–7.450)
TCO2: 33 mmol/L (ref 0–100)
pO2, Arterial: 94 mmHg (ref 80.0–100.0)

## 2015-10-22 LAB — MAGNESIUM: Magnesium: 3 mg/dL — ABNORMAL HIGH (ref 1.7–2.4)

## 2015-10-22 LAB — I-STAT CG4 LACTIC ACID, ED
LACTIC ACID, VENOUS: 0.79 mmol/L (ref 0.5–2.0)
LACTIC ACID, VENOUS: 0.58 mmol/L (ref 0.5–2.0)

## 2015-10-22 LAB — I-STAT TROPONIN, ED: Troponin i, poc: 0.04 ng/mL (ref 0.00–0.08)

## 2015-10-22 LAB — URINALYSIS, ROUTINE W REFLEX MICROSCOPIC
Bilirubin Urine: NEGATIVE
Glucose, UA: 100 mg/dL — AB
KETONES UR: NEGATIVE mg/dL
LEUKOCYTES UA: NEGATIVE
NITRITE: NEGATIVE
Specific Gravity, Urine: 1.017 (ref 1.005–1.030)
pH: 6.5 (ref 5.0–8.0)

## 2015-10-22 LAB — BASIC METABOLIC PANEL
ANION GAP: 15 (ref 5–15)
BUN: 53 mg/dL — AB (ref 6–20)
CO2: 27 mmol/L (ref 22–32)
Calcium: 8.1 mg/dL — ABNORMAL LOW (ref 8.9–10.3)
Chloride: 98 mmol/L — ABNORMAL LOW (ref 101–111)
Creatinine, Ser: 5.83 mg/dL — ABNORMAL HIGH (ref 0.44–1.00)
GFR calc Af Amer: 8 mL/min — ABNORMAL LOW (ref >60)
GFR calc non Af Amer: 7 mL/min — ABNORMAL LOW (ref >60)
GLUCOSE: 135 mg/dL — AB (ref 65–99)
POTASSIUM: 4.9 mmol/L (ref 3.5–5.1)
Sodium: 140 mmol/L (ref 135–145)

## 2015-10-22 LAB — BRAIN NATRIURETIC PEPTIDE: B Natriuretic Peptide: 2971.2 pg/mL — ABNORMAL HIGH (ref 0.0–100.0)

## 2015-10-22 LAB — URINE MICROSCOPIC-ADD ON

## 2015-10-22 LAB — PROTIME-INR
INR: 1.05 (ref 0.00–1.49)
PROTHROMBIN TIME: 13.9 s (ref 11.6–15.2)

## 2015-10-22 MED ORDER — DEXTROSE 5 % IV SOLN
2.0000 g | Freq: Once | INTRAVENOUS | Status: AC
  Administered 2015-10-22: 2 g via INTRAVENOUS
  Filled 2015-10-22: qty 2

## 2015-10-22 MED ORDER — IPRATROPIUM-ALBUTEROL 0.5-2.5 (3) MG/3ML IN SOLN: 3.0000 mL | RESPIRATORY_TRACT | Status: DC | PRN

## 2015-10-22 MED ORDER — VANCOMYCIN HCL IN DEXTROSE 750-5 MG/150ML-% IV SOLN
750.0000 mg | INTRAVENOUS | Status: DC
  Administered 2015-10-24: 750 mg via INTRAVENOUS
  Filled 2015-10-22: qty 150

## 2015-10-22 MED ORDER — RENA-VITE PO TABS
1.0000 | ORAL_TABLET | Freq: Every day | ORAL | Status: DC
  Administered 2015-10-22 – 2015-10-24 (×3): 1 via ORAL
  Filled 2015-10-22 (×3): qty 1

## 2015-10-22 MED ORDER — THYROID 60 MG PO TABS
120.0000 mg | ORAL_TABLET | Freq: Every day | ORAL | Status: DC
  Filled 2015-10-22: qty 2

## 2015-10-22 MED ORDER — DARBEPOETIN ALFA 150 MCG/0.3ML IJ SOSY
150.0000 ug | PREFILLED_SYRINGE | INTRAMUSCULAR | Status: DC
  Administered 2015-10-22: 150 ug via INTRAVENOUS

## 2015-10-22 MED ORDER — METOPROLOL TARTRATE 12.5 MG HALF TABLET
12.5000 mg | ORAL_TABLET | Freq: Two times a day (BID) | ORAL | Status: DC
  Administered 2015-10-22 – 2015-10-24 (×5): 12.5 mg via ORAL
  Filled 2015-10-22 (×5): qty 1

## 2015-10-22 MED ORDER — NITROGLYCERIN 0.4 MG SL SUBL
0.4000 mg | SUBLINGUAL_TABLET | SUBLINGUAL | Status: DC | PRN
  Administered 2015-10-22 (×2): 0.4 mg via SUBLINGUAL
  Filled 2015-10-22: qty 1

## 2015-10-22 MED ORDER — ZOLPIDEM TARTRATE 5 MG PO TABS
10.0000 mg | ORAL_TABLET | Freq: Every evening | ORAL | Status: DC | PRN
  Administered 2015-10-22 – 2015-10-23 (×2): 10 mg via ORAL
  Filled 2015-10-22 (×2): qty 2

## 2015-10-22 MED ORDER — ALPRAZOLAM 0.5 MG PO TABS
0.5000 mg | ORAL_TABLET | Freq: Every day | ORAL | Status: DC | PRN
  Administered 2015-10-24: 0.5 mg via ORAL
  Filled 2015-10-22: qty 1

## 2015-10-22 MED ORDER — CETYLPYRIDINIUM CHLORIDE 0.05 % MT LIQD
7.0000 mL | Freq: Two times a day (BID) | OROMUCOSAL | Status: DC
  Administered 2015-10-23 – 2015-10-24 (×3): 7 mL via OROMUCOSAL

## 2015-10-22 MED ORDER — DEXTROSE 5 % IV SOLN
1.0000 g | Freq: Once | INTRAVENOUS | Status: AC
  Administered 2015-10-22: 1 g via INTRAVENOUS
  Filled 2015-10-22: qty 1

## 2015-10-22 MED ORDER — HEPARIN SODIUM (PORCINE) 5000 UNIT/ML IJ SOLN: 5000.0000 [IU] | Freq: Three times a day (TID) | INTRAMUSCULAR | Status: DC

## 2015-10-22 MED ORDER — VANCOMYCIN HCL IN DEXTROSE 750-5 MG/150ML-% IV SOLN
750.0000 mg | Freq: Once | INTRAVENOUS | Status: AC
  Administered 2015-10-22: 750 mg via INTRAVENOUS
  Filled 2015-10-22: qty 150

## 2015-10-22 MED ORDER — SEVELAMER CARBONATE 800 MG PO TABS
2400.0000 mg | ORAL_TABLET | Freq: Three times a day (TID) | ORAL | Status: DC
  Administered 2015-10-23 – 2015-10-24 (×4): 2400 mg via ORAL
  Filled 2015-10-22 (×5): qty 3

## 2015-10-22 MED ORDER — DEXTROSE 5 % IV SOLN: 1.0000 g | Freq: Three times a day (TID) | INTRAVENOUS | Status: DC

## 2015-10-22 MED ORDER — DILTIAZEM HCL ER COATED BEADS 240 MG PO CP24
240.0000 mg | ORAL_CAPSULE | Freq: Every day | ORAL | Status: DC
  Administered 2015-10-23 – 2015-10-24 (×2): 240 mg via ORAL
  Filled 2015-10-22: qty 1
  Filled 2015-10-22: qty 2
  Filled 2015-10-22 (×2): qty 1
  Filled 2015-10-22: qty 2
  Filled 2015-10-22: qty 1

## 2015-10-22 MED ORDER — POLYETHYLENE GLYCOL 3350 17 G PO PACK
17.0000 g | PACK | Freq: Every day | ORAL | Status: DC
  Administered 2015-10-23 – 2015-10-24 (×2): 17 g via ORAL
  Filled 2015-10-22 (×2): qty 1

## 2015-10-22 MED ORDER — DEXTROSE 5 % IV SOLN
2.0000 g | INTRAVENOUS | Status: DC
  Administered 2015-10-24: 2 g via INTRAVENOUS
  Filled 2015-10-22 (×2): qty 2

## 2015-10-22 MED ORDER — VANCOMYCIN HCL 10 G IV SOLR
1250.0000 mg | Freq: Once | INTRAVENOUS | Status: AC
  Administered 2015-10-22: 1250 mg via INTRAVENOUS
  Filled 2015-10-22: qty 1250

## 2015-10-22 MED ORDER — VANCOMYCIN HCL IN DEXTROSE 1-5 GM/200ML-% IV SOLN: 1000.0000 mg | Freq: Once | INTRAVENOUS | Status: DC

## 2015-10-22 MED ORDER — CALCITRIOL 0.25 MCG PO CAPS
0.2500 ug | ORAL_CAPSULE | ORAL | Status: DC
  Administered 2015-10-24: 0.25 ug via ORAL

## 2015-10-22 MED ORDER — APIXABAN 5 MG PO TABS
5.0000 mg | ORAL_TABLET | Freq: Two times a day (BID) | ORAL | Status: DC
  Administered 2015-10-22 – 2015-10-24 (×5): 5 mg via ORAL
  Filled 2015-10-22 (×5): qty 1

## 2015-10-22 MED ORDER — DARBEPOETIN ALFA 150 MCG/0.3ML IJ SOSY
PREFILLED_SYRINGE | INTRAMUSCULAR | Status: AC
  Administered 2015-10-22: 150 ug via INTRAVENOUS
  Filled 2015-10-22: qty 0.3

## 2015-10-22 MED ORDER — SODIUM CHLORIDE 0.9 % IV BOLUS (SEPSIS): 1000.0000 mL | INTRAVENOUS | Status: DC

## 2015-10-22 MED ORDER — SODIUM CHLORIDE 0.9 % IV SOLN: 125.0000 mg | INTRAVENOUS | Status: DC

## 2015-10-22 NOTE — ED Notes (Signed)
Requested meds from pharmacy  

## 2015-10-22 NOTE — ED Notes (Signed)
Okay for pt to have ice chips per Dr. Leonette Monarch.

## 2015-10-22 NOTE — Progress Notes (Signed)
Dialysis treatment completed.  2500 mL ultrafiltrated and net fluid removal 2000 mL.    Patient assessment unchanged. Lung sounds diminished to ausculation in all fields. No3 edema. Cardiac: Regular R&R, some ectopy.  Disconnected lines and removed needles.  Pressure held for 10 minutes and band aid/gauze dressing applied.  Report given to bedside RN, Joaquim Lai.

## 2015-10-22 NOTE — Consult Note (Signed)
[facility] Renal Consultation Note    Indication for Consultation:  Management of ESRD/hemodialysis; anemia, hypertension/volume and secondary hyperparathyroidism PCP: Sunny Aguon Bellow, MD  HPI: Anita [name] is a 65 y.o. female with ESRD, DM, CAD, afib, hx thyroid cancer s/p total thyroidectomy 2016  on MWF HD at [facility] who hasn't done well since discharge September 03, 2015  from [facility] after an admission for PNA, hypoxia, pulmonary edema. At that time she was dialyzing twice a week and refused to increase to 3 days a week.  She has since done so, but only dialyzes 3.25 hours each treatment.  She gets close to EDW.  Since her previous admission, she notes she doesn't urinate as much.  She cannot hold her urine and wears depends.  She has stopped smoking since that admission, but still comes home from dialysis exhausted and often just goes to bed.  She has also been on home O2 since her last admission.  She presents today with SOB that is also accompanied by chest pain/pressure with nausea/vomiting different from SOB but also states this happens at dialysis sometimes but not to this extent. She tells me she  is supposed to have a fistulagram  Dr. Augustin Coupe at [facility] tomorrow for low AF.  AF actually was 1052 without need for f'gram at this time.  Kinetics are adequate.  She has had progressive unintentional weight loss.  EDW was 71 in September 68.5 in early January and is 48 now.  Evaluation in the ED showed WBC 6.7 Hgb 8.7 initial troponin 0.04 K 4.9 CO2 27 BUN 53 Cr 5.83. BNP 2971ABG ph 7.34 pCO2 57 pO2 94 bicarb 30.9 CXR showed mutifocal bilateral PNA small bilateral pleural effusions  Past Medical History  Diagnosis Date  . Neuropathy due to secondary diabetes ([place])   . Diabetic nephropathy ([place])   . Hypertension   . Anemia   . Dyslipidemia   . Laryngeal mass   . COPD (chronic obstructive pulmonary disease) ([place])   . GERD (gastroesophageal reflux disease)   . Sleep  apnea     Does not use CPAP- due to weight loss  . Atrial fibrillation ([place])     Diagnosed 11/14 of undetermined age of onset    . Diabetes mellitus with end stage renal disease ([place])   . CAD (coronary artery disease) 07/13/2013    Catheterization 5 years ago by Dr. Elisabeth Cara with reportedly nonobstructive disease Calcification noted on CT scan of chest in November of 2014   . ESRD on hemodialysis ([place])   . Chronic diastolic heart failure ([place])   . Aortic stenosis 12/23/2012  . 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 Atherosclerosis of aorta ([place])   . PONV (postoperative nausea and vomiting)   . Shortness of breath dyspnea     with exertion  . Wears glasses   . CHF (congestive heart failure) ([place])   . Thyroid neoplasm     of uncertain behavior  . Diabetic neuropathy [facility])    Past Surgical History  Procedure Laterality Date  . Cholecystectomy    . Appendectomy    . Shoulder arthroscopy Right     w/ repair of rotator cuff repair  . Elbow tendon surgery Right   . Achilles tendon repair Right   . Cesarean section      X 2   . Hemorrhoid surgery      many years ago  . Av fistula placement Left 12/29/2012    Procedure: ARTERIOVENOUS (AV)  FISTULA CREATION;  Surgeon: Elam Dutch, MD;  Location: [facility] OR;  Service: Vascular;  Laterality: Left;  Creation Left Brachial Cephalic Arteriovenous Fistula  . Cardiac catheterization    . Laryngectomy      mass removed- noncancerous  . Lumbar laminectomy      lower back  . Thyroidectomy N/A 10/19/2014    Procedure: TOTAL THYROIDECTOMY;  Surgeon: Armandina Gemma, MD;  Location: [facility] OR;  Service: General;  Laterality: N/A;   Family History  Problem Relation Age of Onset  . COPD Mother   . Thyroid disease Mother   . Multiple sclerosis Father   . Heart disease Father   . Heart attack Father   . Depression Brother     Suicide  . Alcohol abuse Brother   . Heart disease Brother   . Asthma Brother   . Thyroid disease Daughter   . Diabetes  Maternal Aunt    Social History:  reports that she has been smoking Cigarettes and E-cigarettes.  She started smoking about 46 years ago. She has a 20 pack-year smoking history. She has never used smokeless tobacco. She reports that she does not drink alcohol or use illicit drugs. Allergies  Allergen Reactions  . Doxycycline Nausea And Vomiting  . Lipitor [Atorvastatin] Nausea And Vomiting   Prior to Admission medications   Medication Sig Start Date End Date Taking? Authorizing Provider  albuterol (PROVENTIL) (2.5 MG/3ML) 0.083% nebulizer solution Take 2.5 mg by nebulization every 6 (six) hours as needed for wheezing or shortness of breath.   Yes Historical Provider, MD  ALPRAZolam Duanne Moron) 1 MG tablet Take 0.5-1 mg by mouth 4 (four) times daily. Patient takes 1/2 tablet 2-3 times daily and 1 tablet at bedtime   Yes Historical Provider, MD  calcium carbonate (TUMS - DOSED IN MG ELEMENTAL CALCIUM) 500 MG chewable tablet Chew 1 tablet by mouth 2 (two) times daily as needed for heartburn (Takes with snacks. In place of Renvela).   Yes Historical Provider, MD  Cyanocobalamin (VITAMIN B 12 PO) Take 1,000 mg by mouth daily.   Yes Historical Provider, MD  diltiazem (TIAZAC) 240 MG 24 hr capsule Take 240 mg by mouth daily.   Yes Historical Provider, MD  ELIQUIS 5 MG TABS tablet Take 5 mg by mouth 2 (two) times daily.  01/01/14  Yes Historical Provider, MD  hydrALAZINE (APRESOLINE) 100 MG tablet Take 100 mg by mouth 3 (three) times daily.   Yes Historical Provider, MD  HYDROcodone-acetaminophen (NORCO) 7.5-325 MG per tablet Take 1 tablet by mouth every 6 (six) hours as needed for pain. 02/15/13  Yes Regina J Roczniak, PA-C  lisinopril (PRINIVIL,ZESTRIL) 40 MG tablet Take 40 mg by mouth 2 (two) times daily.   Yes Historical Provider, MD  meclizine (ANTIVERT) 25 MG tablet Take 25 mg by mouth 4 (four) times daily as needed for dizziness.   Yes Historical Provider, MD  metoprolol tartrate (LOPRESSOR) 25 MG  tablet Take 0.5 tablets (12.5 mg total) by mouth 2 (two) times daily. 09/03/15  Yes Kathie Dike, MD  multivitamin (RENA-VIT) TABS tablet Take 1 tablet by mouth at bedtime. 07/18/13  Yes Blain Pais, MD  omeprazole (PRILOSEC) 20 MG capsule Take 20 mg by mouth daily as needed (acid reflux).    Yes Historical Provider, MD  ondansetron (ZOFRAN) 4 MG tablet Take 4 mg by mouth every 8 (eight) hours as needed.  12/16/14  Yes Historical Provider, MD  polyethylene glycol (MIRALAX / GLYCOLAX) packet Take 17 g by mouth  daily.   Yes Historical Provider, MD  sevelamer carbonate (RENVELA) 800 MG tablet Take 2,400 mg by mouth 3 (three) times daily with meals.    Yes Historical Provider, MD  Thyroid (NATURE-THROID) 113.75 MG TABS Take by mouth.   Yes Historical Provider, MD  zolpidem (AMBIEN) 10 MG tablet Take 10 mg by mouth at bedtime as needed for sleep.   Yes Historical Provider, MD  furosemide (LASIX) 80 MG tablet Take 1.5 tablets (120 mg total) by mouth daily. Takes Tues,wed,thurs 09/03/15   Kathie Dike, MD  ipratropium-albuterol (DUONEB) 0.5-2.5 (3) MG/3ML SOLN Take 3 mLs by nebulization every 4 (four) hours as needed. 09/03/15   Kathie Dike, MD   Current Facility-Administered Medications  Medication Dose Route Frequency Provider Last Rate Last Dose  . nitroGLYCERIN (NITROSTAT) SL tablet 0.4 mg  0.4 mg Sublingual Q5 min PRN Addison Lank, MD   0.4 mg at 10/22/15 1330  . vancomycin (VANCOCIN) IVPB 1000 mg/200 mL premix  1,000 mg Intravenous Once Addison Lank, MD       Current Outpatient Prescriptions  Medication Sig Dispense Refill  . albuterol (PROVENTIL) (2.5 MG/3ML) 0.083% nebulizer solution Take 2.5 mg by nebulization every 6 (six) hours as needed for wheezing or shortness of breath.    . ALPRAZolam (XANAX) 1 MG tablet Take 0.5-1 mg by mouth 4 (four) times daily. Patient takes 1/2 tablet 2-3 times daily and 1 tablet at bedtime    . calcium carbonate (TUMS - DOSED IN MG ELEMENTAL CALCIUM)  500 MG chewable tablet Chew 1 tablet by mouth 2 (two) times daily as needed for heartburn (Takes with snacks. In place of Renvela).    . Cyanocobalamin (VITAMIN B 12 PO) Take 1,000 mg by mouth daily.    Marland Kitchen diltiazem (TIAZAC) 240 MG 24 hr capsule Take 240 mg by mouth daily.    Marland Kitchen ELIQUIS 5 MG TABS tablet Take 5 mg by mouth 2 (two) times daily.     . hydrALAZINE (APRESOLINE) 100 MG tablet Take 100 mg by mouth 3 (three) times daily.    Marland Kitchen HYDROcodone-acetaminophen (NORCO) 7.5-325 MG per tablet Take 1 tablet by mouth every 6 (six) hours as needed for pain. 30 tablet 0  . lisinopril (PRINIVIL,ZESTRIL) 40 MG tablet Take 40 mg by mouth 2 (two) times daily.    . meclizine (ANTIVERT) 25 MG tablet Take 25 mg by mouth 4 (four) times daily as needed for dizziness.    . metoprolol tartrate (LOPRESSOR) 25 MG tablet Take 0.5 tablets (12.5 mg total) by mouth 2 (two) times daily. 30 tablet 0  . multivitamin (RENA-VIT) TABS tablet Take 1 tablet by mouth at bedtime. 30 tablet 3  . omeprazole (PRILOSEC) 20 MG capsule Take 20 mg by mouth daily as needed (acid reflux).     . ondansetron (ZOFRAN) 4 MG tablet Take 4 mg by mouth every 8 (eight) hours as needed.     . polyethylene glycol (MIRALAX / GLYCOLAX) packet Take 17 g by mouth daily.    . sevelamer carbonate (RENVELA) 800 MG tablet Take 2,400 mg by mouth 3 (three) times daily with meals.     . Thyroid (NATURE-THROID) 113.75 MG TABS Take by mouth.    . zolpidem (AMBIEN) 10 MG tablet Take 10 mg by mouth at bedtime as needed for sleep.    . furosemide (LASIX) 80 MG tablet Take 1.5 tablets (120 mg total) by mouth daily. Takes Tues,wed,thurs 60 tablet 0  . ipratropium-albuterol (DUONEB) 0.5-2.5 (3) MG/3ML SOLN Take 3 mLs by  nebulization every 4 (four) hours as needed. 360 mL 0   Labs: Basic Metabolic Panel:  Recent Labs Lab 10/22/15 1221  NA 140  K 4.9  CL 98*  CO2 27  GLUCOSE 135*  BUN 53*  CREATININE 5.83*  CALCIUM 8.1*  CBC:  Recent Labs Lab  10/22/15 1221  WBC 6.7  HGB 8.7*  HCT 29.3*  MCV 93.0  PLT 165   Studies/Results: Dg Chest 2 View  10/22/2015  CLINICAL DATA:  Shortness of breath for 2 weeks.  Initial encounter. EXAM: CHEST  2 VIEW COMPARISON:  Single view of the chest 09/01/2015 and PA and lateral chest 08/29/2015. FINDINGS: There is bilateral airspace disease worst in the lingula, right middle lobe and left upper lobe. Small pleural effusions are identified. There is cardiomegaly. No pneumothorax. IMPRESSION: Bilateral airspace disease with an appearance most suggestive of multifocal pneumonia rather than pulmonary edema. Associated small bilateral pleural effusions noted. Cardiomegaly. Electronically Signed   By: Inge Rise M.D.   On: 10/22/2015 13:20    ROS: As per HPI otherwise negative.  Physical Exam: Filed Vitals:   10/22/15 1300 10/22/15 1328 10/22/15 1332 10/22/15 1400  BP: 119/62 127/57 128/60 119/56  Pulse: 74  69 71  Temp:      TempSrc:      Resp: 26  18 17   SpO2: 96%  100% 99%     General:  Chronically ill WF appears much older than current age sats ok on O2 Head: Normocephalic, atraumatic, sclera non-icteric, mucus membranes are moist Neck: Supple. JVD to angle of jaw Lungs: bilateral rhonchi R>L, dim bases + crackles Heart: RRR 3/6 murmur - RUSB  Abdomen: Soft, non-tender, non-distended with normoactive bowel sounds; wearing disposable briefs. M-S:  Strength and tone appear diminished for age. Lower extremities: 1+ LE  edema no ischemic changes, no open wounds  Neuro: Alert and oriented X 3. Moves all extremities spontaneously. Psych:  Somewhat anxious Dialysis Access: left upper long ropelike  AVF + bruit/thrill  Last ECHO 2014 > LVEF 70%, marked pulm HTN 53 mmHg, severe LVH, afib, mild AS, RA severely dilated, LA mod dilated, TR moderate  Dialysis Orders:  [facility] MWF 3.25 hour EDW 64 2K 2.25 Ca profile 2 400/800 hyeparin 4000 left upper AVF venofer 50/week Fridays Mircera  150 q 2 - was 100 q 2 - l;ast dose 2/200 calcitriol 0.25 Recent labs:  Hgb 8.8 16% sat 2/13 - got venofer 100 x 3 then 50/week - last ferritin 679 - review looks like delay in increasing ESA and probably needs more Fe and hemocult  Assessment/Plan: 1. HCAP - per primary; empiric Vanc and Cefepime after cultures  2. Chest pressure - per primary, prn NTG 3. ESRD -  MWF - HD today; she would benefit by longer dialysis; no acute need for fistulagram 4. Hypertension/volume  - titrate edw down; she states she cramps at times- longer dialysis would help though I doubt we can convince her of that; on dilt 180, MTP 125 bid and lisinopril 40 - favor holding lisinopril to facilitate fluid removal 5. Anemia  -Hgb stable consistent with outpt Hgb, but low overall; continue ESA - due for redose today and ^ weekly Fe-to 125  tsat low on monthly lab - check hemocult 6. Metabolic bone disease -  Continue calcitriol- on renvela 3 with meals 2 with snacks 7. Nutrition - needs renal/carb mod/vits/supplements 8. Hx thyroid cancer- on replacement therapy 9. Anxiety - on generous dose of xanax 10. Afib -  on dilt, MTP and Eliquis 11. Hx over FTT overall- pt decline physical therapy at the end of her last hospital discharge; unintentional weight loss, long history of smoking (just quite recently); hx of thyroid cancer  Myriam Jacobson, PA-C [place] [phone] 10/22/2015, 2:40 PM   Pt seen, examined and agree w A/P as above. 65 yo with FTT over the last few months. Was at [facility] for 6 days for "pneumonia" in Jan 2017, since dc has continued to feel fatigued w severe exhaustion at times, especially after dilaysis.  Now on home O2. Quit smoking last 2 months.  Now here w SOB, patchy infiltrates on CXR, no fever/ WBC, +LE edema and JVD.  WBC normal.  Last echo 2014 with severe LVH and pulm HTN, RV not mentioned.  Looks cachectic, losing weight.  Heavy smoking history.  Will get CT chest to eval  better for volume overload vs PNA, also r/o malignancy w weight loss.  HD today.   Kelly Splinter MD [employer] pager [phone]    cell [phone] 10/22/2015, 4:50 PM

## 2015-10-22 NOTE — H&P (Signed)
[facility] Admission History and Physical Service Pager: [phone]  Patient name: Anita [name] Medical record number: [id number] Date of birth: 1951-06-15 Age: 65 y.o. Gender: female  Primary Care Provider: Robert Bellow, MD Consultants: nephrology, cardiology  Codee Status: FULL  Chief Complaint: chest pain and shortness of breath   Assessment and Plan: Anita [name] is a 65 y.o. female presenting with chest pain and shortness of breath found to have HCAP and found to be clinically volume overloaded likely due to missed HD today. PMH is significant for Aortic stenosis, DM2 Paroxysmal Atrial Fibrillation, ESRD on HD M/W/F, Chronic Diastolic HF (ECHO 123456: EF 65-70%, G2DD), Nonobstructive CAD.   HCAP with respiratory acidosis with some compensation : Arterial pH 7.341, pCO2 57.1, bicarb 30.9. Normal Lactic Acid. No leukocytosis. CXR: findings suggestive of multifocal PNA. s/p Vancomycin and Cefepime in ED. Currently on 4L O2 with good saturations and normal respiratory effort; home oxygen is 3L. Patient was recently admitted in 08/2015, therefore will treat as a HCAP. Patient does NOT meet sepsis criteria.  - admit to teaching service, telemetry, attending Dr. Nori Riis - Vancomycin and Cefepime per pharmacy - continuous pulse ox, maintain pulse oximetry > 90.  - CT chest per nephrology to further evaluate (noted below)  - blood cultures obtained in ED - Duoneb PRN for wheezing  - legionella and pneumococcal urinary antigen  Chest pain with history of non-obstructive CAD: Central chest pain without radiation and without associated symptoms. EKG with new T wave inversions mainly in V1-V3. ED Troponin 0.04. Per chart review, prior cath without evidence of obstruction in 2009.  - cardiology consulted, appreciate reccs: discussed over the phone. EKG findings seem nonspecific. Recommended trending troponin and to call back  - trend troponin - AM EKG - continuing  home Metoprolol 12.5mg  BID  Clinically Volume Overloaded: JVD to angle of the jaw, diminished lung sounds at bases with crackles bilaterally. Had not received dialysis today (usually gets HD M/W/F). Has a history of diastolic heart failure. BNP 2971 on admission. Normal oxygen saturations on room air with normal respiratory effort. - nephrology following, appreciate reccs: CT chest to better evaluate volume overload vs PNA and also to rule our malignancy with weight loss; HD today  ESRD on HD: Has HD on MWF. Reports taking Lasix 80mg  3 tablets on days she is not on HD, but med list reports 1.5 tabs daily K normal, patient not uremic. Patient with some volume overload however no emergent need for dialysis, ideally would get HD this evening to stay on current schedule - nephrology following, appreciate recs: as noted above - will hold Lasix for now  - continue home Renvela and calcitriol  Atrial fibrillation: HR stable, currently NSR. Patient is followed by Dr. Harl Bowie at [facility] - continue home Diltiazem  - continue home Eliquis   Anxiety: Home med: Xanax 0.5-1mg  QID. Patient takes 0.5 tab 2-3 times daily and 1 tab at bedtime  - will start with Xanax 0.5mg  daily PRN  - home Ambien 10mg  PRN at bedtime   Hx of Thyroid Cancer: Followed by [facility] - continue home Nature-thyroid  HTN: Stable. Home meds: Lasix, Hydralazine 100mg  TID, Lisinopril 40mg  BID, Metoprolol 12.5mg  BID - will continue Metoprolol for now - consider adding other medications if needed will hold for now given BPs normal and she will undergo dialysis this evening.   Normocytic Anemia: hemoglobin 8.7 from 10.9 1 month ago. Elevated RDW at 16.8. No evidence of bleeding clinically -  FOBT ordered (in ED)  - will monitor with labs  - ESA with dialysis  ? Mild Jaundice vs normal hyperpigmentation seen in ESRD: - consider Hepatic function panel  FEN/GI: Renal diet; miralax daily  Prophylaxis: Heparin sub Q  Disposition: admit  to teaching service for evaluation of chest pain and management of HCAP and volume overload   History of Present Illness:  Anita [name] is a 65 y.o. female presenting with chest pain and SOB.  She noted she started developing chest pain last night- she was woken up with chest pain. Chest pain was a "heaviness" that was centrally located without radiation and intermittent. She's had problems breathing since January since she was hospitalized for pneumonia. Notes the SOB was worse with movement. She is on 3L O2 chronically at home.  No coughing. No fevers or chills. She's had a few episodes of rapid heart rate over the last two days.   She's had a poor appetite this AM. No nausea or vomiting.   Of note, patient notes of taking Lasix 80mg  3 tablets on days she is not on HD. Per chart review, states 1.5 tabs daily. She states she has HD M/W/F, with last HD on this past Friday.   In ED she was given SL nitro x 2 that resolved her pain. On admission, patient's vitals were stable. Per chart review, no desaturations noted. Placed on 4L [place] with good saturations.   Review Of Systems: Per HPI with the following additions: none Otherwise the remainder of the systems were negative.  Patient Active Problem List   Diagnosis Date Noted  . HCAP (healthcare-associated pneumonia) 08/31/2015  . Acute respiratory failure with hypoxia ([place]) 08/31/2015  . Elevated troponin 08/31/2015  . HTN (hypertension) 08/31/2015  . PNA (pneumonia) 08/28/2015  . 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 Atrial fibrillation ([place]) 02/27/2014  . Right foot injury 09/07/2013  . CAD (coronary artery disease) 07/13/2013  . Protein-calorie malnutrition, severe ([place]) 07/13/2013  . Episodic atrial fibrillation ([place])   . Diabetes mellitus with end stage renal disease ([place])   . 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 Atherosclerosis of aorta ([place])   . ESRD on hemodialysis ([place])   . Chronic diastolic heart  failure ([place])   . Diabetic nephropathy ([place])   . Neuropathy due to secondary diabetes ([place])   . Long-term (current) use of anticoagulants   . Edema 03/21/2013  . Aortic stenosis 12/23/2012  . Obesity, unspecified 10/22/2011  . Secondary hyperparathyroidism, non-renal ([place]) 07/05/2009    Past Medical History: Past Medical History  Diagnosis Date  . Neuropathy due to secondary diabetes ([place])   . Diabetic nephropathy ([place])   . Hypertension   . Anemia   . Dyslipidemia   . Laryngeal mass   . COPD (chronic obstructive pulmonary disease) ([place])   . GERD (gastroesophageal reflux disease)   . Sleep apnea     Does not use CPAP- due to weight loss  . Atrial fibrillation ([place])     Diagnosed 11/14 of undetermined age of onset    . Diabetes mellitus with end stage renal disease ([place])   . CAD (coronary artery disease) 07/13/2013    Catheterization 5 years ago by Dr. Elisabeth Cara with reportedly nonobstructive disease Calcification noted on CT scan of chest in November of 2014   . ESRD on hemodialysis ([place])   . Chronic diastolic heart failure ([place])   . Aortic stenosis 12/23/2012  . 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 Atherosclerosis of aorta ([place])   . PONV (postoperative nausea and vomiting)   . Shortness of breath dyspnea     with exertion  . Wears glasses   . CHF (congestive heart failure) ([place])   . Thyroid neoplasm     of uncertain behavior  . Diabetic neuropathy [facility])     Past Surgical History: Past Surgical History  Procedure Laterality Date  . Cholecystectomy    . Appendectomy    . Shoulder arthroscopy Right     w/ repair of rotator cuff repair  . Elbow tendon surgery Right   . Achilles tendon repair Right   . Cesarean section      X 2   . Hemorrhoid surgery      many years ago  . Av fistula placement Left 12/29/2012    Procedure: ARTERIOVENOUS (AV) FISTULA CREATION;  Surgeon: Elam Dutch, MD;  Location: [facility] OR;  Service: Vascular;  Laterality: Left;  Creation Left  Brachial Cephalic Arteriovenous Fistula  . Cardiac catheterization    . Laryngectomy      mass removed- noncancerous  . Lumbar laminectomy      lower back  . Thyroidectomy N/A 10/19/2014    Procedure: TOTAL THYROIDECTOMY;  Surgeon: Armandina Gemma, MD;  Location: [facility];  Service: General;  Laterality: N/A;    Social History: Social History  Substance Use Topics  . Smoking status: Current Some Day Smoker -- 0.50 packs/day for 40 years    Types: Cigarettes, E-cigarettes    Start date: 08/05/1969  . Smokeless tobacco: Never Used  . Alcohol Use: No   Additional social history: former smoker, quit 08/2015,  No alcohol or drug use.  Please also refer to relevant sections of EMR.  Family History: Family History  Problem Relation Age of Onset  . COPD Mother   . Thyroid disease Mother   . Multiple sclerosis Father   . Heart disease Father   . Heart attack Father   . Depression Brother     Suicide  . Alcohol abuse Brother   . Heart disease Brother   . Asthma Brother   . Thyroid disease Daughter   . Diabetes Maternal Aunt    Allergies and Medications: Allergies  Allergen Reactions  . Doxycycline Nausea And Vomiting  . Lipitor [Atorvastatin] Nausea And Vomiting   No current facility-administered medications on file prior to encounter.   Current Outpatient Prescriptions on File Prior to Encounter  Medication Sig Dispense Refill  . albuterol (PROVENTIL) (2.5 MG/3ML) 0.083% nebulizer solution Take 2.5 mg by nebulization every 6 (six) hours as needed for wheezing or shortness of breath.    . ALPRAZolam (XANAX) 1 MG tablet Take 0.5-1 mg by mouth 4 (four) times daily. Patient takes 1/2 tablet 2-3 times daily and 1 tablet at bedtime    . calcium carbonate (TUMS - DOSED IN MG ELEMENTAL CALCIUM) 500 MG chewable tablet Chew 1 tablet by mouth 2 (two) times daily as needed for heartburn (Takes with snacks. In place of Renvela).    . Cyanocobalamin (VITAMIN B 12 PO) Take 1,000 mg by mouth daily.     Marland Kitchen diltiazem (TIAZAC) 240 MG 24 hr capsule Take 240 mg by mouth daily.    Marland Kitchen ELIQUIS 5 MG TABS tablet Take 5 mg by mouth 2 (two) times daily.     . hydrALAZINE (APRESOLINE) 100 MG tablet Take 100 mg by mouth 3 (three) times daily.    Marland Kitchen HYDROcodone-acetaminophen (NORCO) 7.5-325 MG per tablet Take  1 tablet by mouth every 6 (six) hours as needed for pain. 30 tablet 0  . lisinopril (PRINIVIL,ZESTRIL) 40 MG tablet Take 40 mg by mouth 2 (two) times daily.    . meclizine (ANTIVERT) 25 MG tablet Take 25 mg by mouth 4 (four) times daily as needed for dizziness.    . metoprolol tartrate (LOPRESSOR) 25 MG tablet Take 0.5 tablets (12.5 mg total) by mouth 2 (two) times daily. 30 tablet 0  . multivitamin (RENA-VIT) TABS tablet Take 1 tablet by mouth at bedtime. 30 tablet 3  . omeprazole (PRILOSEC) 20 MG capsule Take 20 mg by mouth daily as needed (acid reflux).     . ondansetron (ZOFRAN) 4 MG tablet Take 4 mg by mouth every 8 (eight) hours as needed.     . polyethylene glycol (MIRALAX / GLYCOLAX) packet Take 17 g by mouth daily.    . sevelamer carbonate (RENVELA) 800 MG tablet Take 2,400 mg by mouth 3 (three) times daily with meals.     . Thyroid (NATURE-THROID) 113.75 MG TABS Take by mouth.    . zolpidem (AMBIEN) 10 MG tablet Take 10 mg by mouth at bedtime as needed for sleep.    . furosemide (LASIX) 80 MG tablet Take 1.5 tablets (120 mg total) by mouth daily. Takes Tues,wed,thurs 60 tablet 0  . ipratropium-albuterol (DUONEB) 0.5-2.5 (3) MG/3ML SOLN Take 3 mLs by nebulization every 4 (four) hours as needed. 360 mL 0     Objective: BP 119/56 mmHg  Pulse 71  Temp(Src) 98.2 F (36.8 C) (Oral)  Resp 17  SpO2 99% Exam: General: NAD Eyes: PERRL; mild scleral icterus? ENTM: mildly dry MM Neck: normal ROM Cardiovascular: RRR, 4/6 systolic murmur best heard right upper sternal border. +JVD Respiratory: On 4L with normal oxygen saturations, normal effort, diminished breath sounds near bases but crackles  noted bilaterally Abdomen: Soft, nontender, nondistended, NABS, no organomegaly MSK: able to move all extremities spontaneously, trace pitting edema to mid shin bilaterally; no calf tenderness; AVG on left forearm with good thrill and bruit Skin: mildly jaundiced? Hyperpigmented, warm and dry  Neuro: Awake, alert, no focal deficits grossly, normal speech Psych: Mood and affect euthymic, normal rate and volume of speech   Labs and Imaging: CBC BMET   Recent Labs Lab 10/22/15 1221  WBC 6.7  HGB 8.7*  HCT 29.3*  PLT 165    Recent Labs Lab 10/22/15 1221  NA 140  K 4.9  CL 98*  CO2 27  BUN 53*  CREATININE 5.83*  GLUCOSE 135*  CALCIUM 8.1*    Results for WAYNEISHA, MAUGER (MRN [id number]) as of 10/22/2015 17:27  Ref. Range 10/22/2015 13:46  Sample type Unknown ARTERIAL  pH, Arterial Latest Ref Range: 7.350-7.450  7.341 (L)  pCO2 arterial Latest Ref Range: 35.0-45.0 mmHg 57.1 (HH)  pO2, Arterial Latest Ref Range: 80.0-100.0 mmHg 94.0  Bicarbonate Latest Ref Range: 20.0-24.0 mEq/L 30.9 (H)  TCO2 Latest Ref Range: 0-100 mmol/L 33  Acid-Base Excess Latest Ref Range: 0.0-2.0 mmol/L 4.0 (H)  O2 Saturation Latest Units: % 97.0   CXR: FINDINGS: There is bilateral airspace disease worst in the lingula, right middle lobe and left upper lobe. Small pleural effusions are identified. There is cardiomegaly. No pneumothorax.  IMPRESSION: Bilateral airspace disease with an appearance most suggestive of multifocal pneumonia rather than pulmonary edema. Associated small bilateral pleural effusions noted  Lactic Acid: 0.79, 0.58  U/A: cloudy, moderate hemoglobin, negative nitrite and leukocyte esterase, 6-30 squams  EKG: NSR, new T wave  changes in V1-V3. Troponin 0.04  Smiley Houseman, MD 10/22/2015, 2:28 PM PGY-1, [place] Intern pager: [phone], text pages welcome  Upper Level Addendum:  I have seen and evaluated this patient along with Dr. Dallas Schimke and  reviewed the above note, making necessary revisions in purple.   Archie Patten, MD [place] Resident, PGY-2

## 2015-10-22 NOTE — ED Notes (Signed)
Pt reports recently admitted to hospital due to pneumonia. Having chest pains, palpitations and sob since last night. Pt wears home o2, spo2 91% at triage on 3L [place]. Last dialysis was Friday.

## 2015-10-22 NOTE — ED Notes (Signed)
Nephrology at bedside

## 2015-10-22 NOTE — Progress Notes (Signed)
Pharmacy Antibiotic Note  Anita [name] is a 65 y.o. female admitted on 10/22/2015 with pneumonia.  Pharmacy has been consulted for cefepime and vancomycin dosing. Pt has history of ESRD on HD MWF.  Pt received vancomycin 1250mg  and cefepime 2g IV once in the ED.  Plan: Vancomycin 750mg  IV qHD Cefepime 2g IV qHD Monitor culture data, HD plans and clinical course VT at SS prn     Temp (24hrs), Avg:98.2 F (36.8 C), Min:98.2 F (36.8 C), Max:98.2 F (36.8 C)   Recent Labs Lab 10/22/15 1221 10/22/15 1255  WBC 6.7  --   CREATININE 5.83*  --   LATICACIDVEN  --  0.79    CrCl cannot be calculated (Unknown ideal weight.).    Allergies  Allergen Reactions  . Doxycycline Nausea And Vomiting  . Lipitor [Atorvastatin] Nausea And Vomiting    Antimicrobials this admission: Cefepime 3/6 >>  Vanc 3/6 >>   Dose adjustments this admission: n/a  Microbiology results:  BCx:   UCx:    Sputum:    MRSA PCR:    Andrey Cota. Diona Foley, PharmD, BCPS Clinical Pharmacist Pager [phone] 10/22/2015 1:59 PM

## 2015-10-22 NOTE — ED Provider Notes (Signed)
CSN: [id number]     Arrival date & time 10/22/15  1208 History   First MD Initiated Contact with Patient 10/22/15 1227     Chief Complaint  Patient presents with  . Chest Pain  . Shortness of Breath     (Consider location/radiation/quality/duration/timing/severity/associated sxs/prior Treatment) Patient is a 65 y.o. female presenting with chest pain and shortness of breath. The history is provided by the patient and the spouse.  Chest Pain Pain location:  Substernal area Pain quality: pressure   Pain radiates to:  Does not radiate Pain severity:  Moderate Onset quality:  Gradual Duration:  2 days Timing:  Constant Progression:  Waxing and waning Chronicity:  Recurrent Context: at rest   Relieved by:  Nothing Worsened by:  Exertion Associated symptoms: cough, lower extremity edema, nausea and shortness of breath   Associated symptoms: no abdominal pain, no anorexia, no anxiety, no back pain, no dizziness, no fatigue, no fever, no headache, no palpitations, no syncope, not vomiting and no weakness   Risk factors: diabetes mellitus, hypertension, obesity and smoking   Risk factors: no coronary artery disease   Shortness of Breath Associated symptoms: chest pain and cough   Associated symptoms: no abdominal pain, no ear pain, no fever, no headaches, no rash, no sore throat, no syncope and no vomiting     Past Medical History  Diagnosis Date  . Neuropathy due to secondary diabetes ([place])   . Diabetic nephropathy ([place])   . Hypertension   . Anemia   . Dyslipidemia   . Laryngeal mass   . COPD (chronic obstructive pulmonary disease) ([place])   . GERD (gastroesophageal reflux disease)   . Sleep apnea     Does not use CPAP- due to weight loss  . Atrial fibrillation ([place])     Diagnosed 11/14 of undetermined age of onset    . 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 CAD (coronary artery disease) 07/13/2013    Catheterization 5 years ago by Dr. Elisabeth Cara with reportedly  nonobstructive disease Calcification noted on CT scan of chest in November of 2014   . ESRD on hemodialysis ([place])   . Chronic diastolic heart failure ([place])   . Aortic stenosis 12/23/2012  . 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 Atherosclerosis of aorta ([place])   . PONV (postoperative nausea and vomiting)   . Shortness of breath dyspnea     with exertion  . Wears glasses   . CHF (congestive heart failure) ([place])   . Thyroid neoplasm     of uncertain behavior  . Diabetic neuropathy [facility])    Past Surgical History  Procedure Laterality Date  . Cholecystectomy    . Appendectomy    . Shoulder arthroscopy Right     w/ repair of rotator cuff repair  . Elbow tendon surgery Right   . Achilles tendon repair Right   . Cesarean section      X 2   . Hemorrhoid surgery      many years ago  . Av fistula placement Left 12/29/2012    Procedure: ARTERIOVENOUS (AV) FISTULA CREATION;  Surgeon: Elam Dutch, MD;  Location: [facility] OR;  Service: Vascular;  Laterality: Left;  Creation Left Brachial Cephalic Arteriovenous Fistula  . Cardiac catheterization    . Laryngectomy      mass removed- noncancerous  . Lumbar laminectomy      lower back  . Thyroidectomy N/A 10/19/2014    Procedure: TOTAL THYROIDECTOMY;  Surgeon:  Armandina Gemma, MD;  Location: [facility] OR;  Service: General;  Laterality: N/A;   Family History  Problem Relation Age of Onset  . COPD Mother   . Thyroid disease Mother   . Multiple sclerosis Father   . Heart disease Father   . Heart attack Father   . Depression Brother     Suicide  . Alcohol abuse Brother   . Heart disease Brother   . Asthma Brother   . Thyroid disease Daughter   . Diabetes Maternal Aunt    Social History  Substance Use Topics  . Smoking status: Current Some Day Smoker -- 0.50 packs/day for 40 years    Types: Cigarettes, E-cigarettes    Start date: 08/05/1969  . Smokeless tobacco: Never Used  . Alcohol Use: No   OB History    No data available     Review of  Systems  Constitutional: Negative for fever, chills, appetite change and fatigue.  HENT: Negative for congestion, ear pain, facial swelling, mouth sores and sore throat.   Eyes: Negative for visual disturbance.  Respiratory: Positive for cough and shortness of breath. Negative for chest tightness.   Cardiovascular: Positive for chest pain and leg swelling. Negative for palpitations and syncope.  Gastrointestinal: Positive for nausea. Negative for vomiting, abdominal pain, diarrhea, blood in stool and anorexia.  Endocrine: Negative for cold intolerance and heat intolerance.  Genitourinary: Negative for frequency, decreased urine volume and difficulty urinating.  Musculoskeletal: Negative for back pain and neck stiffness.  Skin: Negative for rash.  Neurological: Negative for dizziness, weakness, light-headedness and headaches.  All other systems reviewed and are negative.     Allergies  Doxycycline and Lipitor  Home Medications   Prior to Admission medications   Medication Sig Start Date End Date Taking? Authorizing Provider  albuterol (PROVENTIL) (2.5 MG/3ML) 0.083% nebulizer solution Take 2.5 mg by nebulization every 6 (six) hours as needed for wheezing or shortness of breath.   Yes Historical Provider, MD  ALPRAZolam Duanne Moron) 1 MG tablet Take 0.5-1 mg by mouth 4 (four) times daily. Patient takes 1/2 tablet 2-3 times daily and 1 tablet at bedtime   Yes Historical Provider, MD  calcium carbonate (TUMS - DOSED IN MG ELEMENTAL CALCIUM) 500 MG chewable tablet Chew 1 tablet by mouth 2 (two) times daily as needed for heartburn (Takes with snacks. In place of Renvela).   Yes Historical Provider, MD  Cyanocobalamin (VITAMIN B 12 PO) Take 1,000 mg by mouth daily.   Yes Historical Provider, MD  diltiazem (TIAZAC) 240 MG 24 hr capsule Take 240 mg by mouth daily.   Yes Historical Provider, MD  ELIQUIS 5 MG TABS tablet Take 5 mg by mouth 2 (two) times daily.  01/01/14  Yes Historical Provider, MD   hydrALAZINE (APRESOLINE) 100 MG tablet Take 100 mg by mouth 3 (three) times daily.   Yes Historical Provider, MD  HYDROcodone-acetaminophen (NORCO) 7.5-325 MG per tablet Take 1 tablet by mouth every 6 (six) hours as needed for pain. 02/15/13  Yes Regina J Roczniak, PA-C  lisinopril (PRINIVIL,ZESTRIL) 40 MG tablet Take 40 mg by mouth 2 (two) times daily.   Yes Historical Provider, MD  meclizine (ANTIVERT) 25 MG tablet Take 25 mg by mouth 4 (four) times daily as needed for dizziness.   Yes Historical Provider, MD  metoprolol tartrate (LOPRESSOR) 25 MG tablet Take 0.5 tablets (12.5 mg total) by mouth 2 (two) times daily. 09/03/15  Yes Kathie Dike, MD  multivitamin (RENA-VIT) TABS tablet Take 1 tablet  by mouth at bedtime. 07/18/13  Yes Blain Pais, MD  omeprazole (PRILOSEC) 20 MG capsule Take 20 mg by mouth daily as needed (acid reflux).    Yes Historical Provider, MD  ondansetron (ZOFRAN) 4 MG tablet Take 4 mg by mouth every 8 (eight) hours as needed.  12/16/14  Yes Historical Provider, MD  polyethylene glycol (MIRALAX / GLYCOLAX) packet Take 17 g by mouth daily.   Yes Historical Provider, MD  sevelamer carbonate (RENVELA) 800 MG tablet Take 2,400 mg by mouth 3 (three) times daily with meals.    Yes Historical Provider, MD  Thyroid (NATURE-THROID) 113.75 MG TABS Take by mouth.   Yes Historical Provider, MD  zolpidem (AMBIEN) 10 MG tablet Take 10 mg by mouth at bedtime as needed for sleep.   Yes Historical Provider, MD  furosemide (LASIX) 80 MG tablet Take 1.5 tablets (120 mg total) by mouth daily. Takes Tues,wed,thurs 09/03/15   Kathie Dike, MD  ipratropium-albuterol (DUONEB) 0.5-2.5 (3) MG/3ML SOLN Take 3 mLs by nebulization every 4 (four) hours as needed. 09/03/15   Kathie Dike, MD   BP 119/56 mmHg  Pulse 71  Temp(Src) 98.2 F (36.8 C) (Oral)  Resp 17  SpO2 99% Physical Exam  Constitutional: She is oriented to person, place, and time. She appears well-developed and well-nourished.  She appears ill (chronically ill-appearing). No distress. Nasal cannula in place.  HENT:  Head: Normocephalic and atraumatic.  Right Ear: External ear normal.  Left Ear: External ear normal.  Nose: Nose normal.  Eyes: Conjunctivae and EOM are normal. Pupils are equal, round, and reactive to light. Right eye exhibits no discharge. Left eye exhibits no discharge. Scleral icterus is present.  Neck: Normal range of motion. Neck supple.  Cardiovascular: Normal rate, regular rhythm and normal heart sounds.  Exam reveals no gallop and no friction rub.   No murmur heard. Holosystolic murmur in all fields likely fistula resonance  Pulmonary/Chest: Effort normal. No stridor. No respiratory distress. She has no wheezes. She has rales in the right lower field and the left middle field.  Abdominal: Soft. She exhibits no distension. There is no tenderness.  Musculoskeletal: She exhibits no edema or tenderness.  1+ pitting bilateral lower extremity edema   Neurological: She is alert and oriented to person, place, and time.  Skin: Skin is warm and dry. No rash noted. She is not diaphoretic. No erythema.  Psychiatric: She has a normal mood and affect.    ED Course  Procedures (including critical care time) Labs Review Labs Reviewed  BASIC METABOLIC PANEL - Abnormal; Notable for the following:    Chloride 98 (*)    Glucose, Bld 135 (*)    BUN 53 (*)    Creatinine, Ser 5.83 (*)    Calcium 8.1 (*)    GFR calc non Af Amer 7 (*)    GFR calc Af Amer 8 (*)    All other components within normal limits  CBC - Abnormal; Notable for the following:    RBC 3.15 (*)    Hemoglobin 8.7 (*)    HCT 29.3 (*)    MCHC 29.7 (*)    RDW 16.8 (*)    All other components within normal limits  BRAIN NATRIURETIC PEPTIDE - Abnormal; Notable for the following:    B Natriuretic Peptide 2971.2 (*)    All other components within normal limits  MAGNESIUM - Abnormal; Notable for the following:    Magnesium 3.0 (*)    All  other components within  normal limits  I-STAT ARTERIAL BLOOD GAS, ED - Abnormal; Notable for the following:    pH, Arterial 7.341 (*)    pCO2 arterial 57.1 (*)    Bicarbonate 30.9 (*)    Acid-Base Excess 4.0 (*)    All other components within normal limits  CULTURE, BLOOD (ROUTINE X 2)  CULTURE, BLOOD (ROUTINE X 2)  PROTIME-INR  URINALYSIS, ROUTINE W REFLEX MICROSCOPIC (NOT AT [facility])  OCCULT BLOOD X 1 CARD TO LAB, STOOL  I-STAT TROPOININ, ED  I-STAT TROPOININ, ED  I-STAT CG4 LACTIC ACID, ED  I-STAT CG4 LACTIC ACID, ED    Imaging Review Dg Chest 2 View  10/22/2015  CLINICAL DATA:  Shortness of breath for 2 weeks.  Initial encounter. EXAM: CHEST  2 VIEW COMPARISON:  Single view of the chest 09/01/2015 and PA and lateral chest 08/29/2015. FINDINGS: There is bilateral airspace disease worst in the lingula, right middle lobe and left upper lobe. Small pleural effusions are identified. There is cardiomegaly. No pneumothorax. IMPRESSION: Bilateral airspace disease with an appearance most suggestive of multifocal pneumonia rather than pulmonary edema. Associated small bilateral pleural effusions noted. Cardiomegaly. Electronically Signed   By: Inge Rise M.D.   On: 10/22/2015 13:20   I have personally reviewed and evaluated these images and lab results as part of my medical decision-making.   EKG Interpretation   Date/Time:  Monday October 22 2015 12:15:23 EST Ventricular Rate:  75 PR Interval:  176 QRS Duration: 92 QT Interval:  406 QTC Calculation: 453 R Axis:   71 Text Interpretation:  Normal sinus rhythm ST \\T \ T wave abnormality,  consider anterior ischemia Abnormal ECG ST abnormality in V3 new from  prior, more pronounced changes in inferior leads compared to prior  Confirmed by [facility] MD, [place] ([id number]) on 10/22/2015 2:05:17 PM      MDM   65 year old female with extensive past medical history listed above. She presents today with 2 days of chest pain and shortness of  breath. History as above.  Of note the patient was admitted in January to [facility] for COPD exacerbation secondary to pneumonia. Additionally patient's ESRD on dialysis Monday, Wednesday and Friday and has not missed any dialysis sessions. She did missed this morning since she's been feeling ill.  On arrival patient is afebrile with stable vital signs. She is ill-appearing from chronic disease. Lungs with faint focal crackles. Chest x-ray with multilobar pneumonia. No evidence of sepsis. Patient started on empiric antibiotics for healthcare acquired pneumonia.  EKG revealed a new unspecific T-wave changes. Initial troponin negative. Patient will require admission for troponin trending to rule out ACS.  Labs consistent with volume overload with a BNP of 2900. Patient will require admission for hemodialysis. Nephrology consultation and will see the patient on the floor.  Hemoglobin drop of 2 g. Patient denied any recent melena. She states that she was sent home from the dialysis center with Hemoccults to do on herself that were negative the past 3 days.   Patient admitted to family medicine for continued workup and management.  Diagnostic studies interpreted by me and use to my clinical decision-making. Next  Patient seen in conjunction with Dr. Billy Fischer.  Final diagnoses:  HCAP (healthcare-associated pneumonia)  Other hypervolemia  Chest pain, unspecified chest pain type  SOB (shortness of breath)        Addison Lank, MD 10/22/15 1553  Gareth Morgan, MD 10/22/15 2134

## 2015-10-23 DIAGNOSIS — I480 Paroxysmal atrial fibrillation: Secondary | ICD-10-CM

## 2015-10-23 DIAGNOSIS — I25118 Atherosclerotic heart disease of native coronary artery with other forms of angina pectoris: Secondary | ICD-10-CM

## 2015-10-23 DIAGNOSIS — N186 End stage renal disease: Secondary | ICD-10-CM

## 2015-10-23 DIAGNOSIS — R9431 Abnormal electrocardiogram [ECG] [EKG]: Secondary | ICD-10-CM | POA: Diagnosis present

## 2015-10-23 DIAGNOSIS — R079 Chest pain, unspecified: Secondary | ICD-10-CM

## 2015-10-23 DIAGNOSIS — E1122 Type 2 diabetes mellitus with diabetic chronic kidney disease: Secondary | ICD-10-CM

## 2015-10-23 DIAGNOSIS — R918 Other nonspecific abnormal finding of lung field: Secondary | ICD-10-CM | POA: Insufficient documentation

## 2015-10-23 DIAGNOSIS — I10 Essential (primary) hypertension: Secondary | ICD-10-CM

## 2015-10-23 DIAGNOSIS — R7989 Other specified abnormal findings of blood chemistry: Secondary | ICD-10-CM

## 2015-10-23 DIAGNOSIS — Z992 Dependence on renal dialysis: Secondary | ICD-10-CM

## 2015-10-23 LAB — RENAL FUNCTION PANEL
Albumin: 2.3 g/dL — ABNORMAL LOW (ref 3.5–5.0)
Anion gap: 11 (ref 5–15)
BUN: 26 mg/dL — ABNORMAL HIGH (ref 6–20)
CO2: 28 mmol/L (ref 22–32)
Calcium: 8.1 mg/dL — ABNORMAL LOW (ref 8.9–10.3)
Chloride: 95 mmol/L — ABNORMAL LOW (ref 101–111)
Creatinine, Ser: 3.65 mg/dL — ABNORMAL HIGH (ref 0.44–1.00)
GFR calc Af Amer: 14 mL/min — ABNORMAL LOW (ref >60)
GFR calc non Af Amer: 12 mL/min — ABNORMAL LOW (ref >60)
Glucose, Bld: 82 mg/dL (ref 65–99)
Phosphorus: 5.3 mg/dL — ABNORMAL HIGH (ref 2.5–4.6)
Potassium: 4.2 mmol/L (ref 3.5–5.1)
Sodium: 134 mmol/L — ABNORMAL LOW (ref 135–145)

## 2015-10-23 LAB — MRSA PCR SCREENING: MRSA BY PCR: NEGATIVE

## 2015-10-23 LAB — TROPONIN I
TROPONIN I: 0.04 ng/mL — AB (ref <0.031)
Troponin I: 0.04 ng/mL — ABNORMAL HIGH (ref <0.031)
Troponin I: 0.05 ng/mL — ABNORMAL HIGH (ref <0.031)

## 2015-10-23 LAB — HEPATIC FUNCTION PANEL
ALK PHOS: 56 U/L (ref 38–126)
ALT: 10 U/L — AB (ref 14–54)
AST: 19 U/L (ref 15–41)
Albumin: 2.4 g/dL — ABNORMAL LOW (ref 3.5–5.0)
BILIRUBIN DIRECT: 0.2 mg/dL (ref 0.1–0.5)
BILIRUBIN INDIRECT: 0.3 mg/dL (ref 0.3–0.9)
BILIRUBIN TOTAL: 0.5 mg/dL (ref 0.3–1.2)
TOTAL PROTEIN: 5.4 g/dL — AB (ref 6.5–8.1)

## 2015-10-23 MED ORDER — SODIUM CHLORIDE 0.9 % IV SOLN: 100.0000 mL | INTRAVENOUS | Status: DC | PRN

## 2015-10-23 MED ORDER — LIDOCAINE HCL (PF) 1 % IJ SOLN: 5.0000 mL | INTRAMUSCULAR | Status: DC | PRN

## 2015-10-23 MED ORDER — LIDOCAINE-PRILOCAINE 2.5-2.5 % EX CREA: 1.0000 "application " | TOPICAL_CREAM | CUTANEOUS | Status: DC | PRN

## 2015-10-23 MED ORDER — PENTAFLUOROPROP-TETRAFLUOROETH EX AERO: 1.0000 "application " | INHALATION_SPRAY | CUTANEOUS | Status: DC | PRN

## 2015-10-23 MED ORDER — HEPARIN SODIUM (PORCINE) 1000 UNIT/ML DIALYSIS: 1000.0000 [IU] | INTRAMUSCULAR | Status: DC | PRN

## 2015-10-23 MED ORDER — ALTEPLASE 2 MG IJ SOLR: 2.0000 mg | Freq: Once | INTRAMUSCULAR | Status: DC | PRN

## 2015-10-23 MED ORDER — THYROID 120 MG PO TABS
120.0000 mg | ORAL_TABLET | Freq: Every day | ORAL | Status: DC
  Filled 2015-10-23: qty 1

## 2015-10-23 MED ORDER — HEPARIN SODIUM (PORCINE) 1000 UNIT/ML DIALYSIS: 20.0000 [IU]/kg | INTRAMUSCULAR | Status: DC | PRN

## 2015-10-23 MED ORDER — HEPARIN SODIUM (PORCINE) 1000 UNIT/ML DIALYSIS: 2000.0000 [IU] | INTRAMUSCULAR | Status: DC | PRN

## 2015-10-23 MED ORDER — ALTEPLASE 2 MG IJ SOLR
2.0000 mg | Freq: Once | INTRAMUSCULAR | Status: DC | PRN
  Filled 2015-10-23: qty 2

## 2015-10-23 MED ORDER — IPRATROPIUM-ALBUTEROL 0.5-2.5 (3) MG/3ML IN SOLN
3.0000 mL | Freq: Four times a day (QID) | RESPIRATORY_TRACT | Status: DC
Start: 2015-10-23 — End: 2015-10-24
  Administered 2015-10-24: 3 mL via RESPIRATORY_TRACT
  Filled 2015-10-23 (×2): qty 3

## 2015-10-23 MED ORDER — ISOSORBIDE MONONITRATE ER 30 MG PO TB24
15.0000 mg | ORAL_TABLET | Freq: Every day | ORAL | Status: DC
  Administered 2015-10-23 – 2015-10-24 (×2): 15 mg via ORAL
  Filled 2015-10-23 (×2): qty 1

## 2015-10-23 MED ORDER — THYROID 60 MG PO TABS
195.0000 mg | ORAL_TABLET | Freq: Every day | ORAL | Status: DC
  Administered 2015-10-24: 195 mg via ORAL
  Filled 2015-10-23: qty 1

## 2015-10-23 NOTE — Progress Notes (Signed)
Patient arrived on unit via stretcher from HD. Patient alert and oriented x4. Patient oriented to room, unit, and staff. Patient placed on telemetry, [place] notified. Skin assessment completed with Mickel Baas D., redness on bottom noted, blanchable. Also generalized ecchymosis noted. Patient's IV clean, dry intact. Pain placed on 4L of oxygen via nasal cannula. Patient rates pain 0/10. Safety Fall Prevention Plan was given, discussed and signed by patient. Orders have been reviewed and implemented.RN will continue to monitor the patient. Call light has been placed within reach and bed alarm has been activated.   Nena Polio BSN, RN  Phone Number: [phone]

## 2015-10-23 NOTE — Progress Notes (Signed)
[facility] Progress Note  Dialysis Orders: [facility] MWF 3.25 hour EDW 64 2K 2.25 Ca profile 2 400/800 hyeparin 4000 left upper AVF venofer 50/week Fridays Mircera 150 q 2 - was 100 q 2 - l;ast dose 2/200 calcitriol 0.25 Recent labs: Hgb 8.8 16% sat 2/13 - got venofer 100 x 3 then 50/week - last ferritin 679 - review looks like delay in increasing ESA and probably needs more Fe and hemocult  Assessment/Plan: 1. HCAP - per primary; empiric Vanc and Cefepime after cultures; Chest CT not convincing for PNA;  emphysematous changes; bilateral loculated small pleural effusions 2. SOB/COPD -long smoking history - stopped relatively recently -  emphysematous changes; suspect symptoms were related to excess volume - 2 L off - post weight 64.8 (bed scale) 3. Chest pressure - per primary, prn NTG- no more symptoms since admission 4. ESRD - MWF - HD Wed; she would benefit by longer dialysis; no acute need for fistulagram- tried to reassure her this was not needed at this time 5. Hypertension/volume - titrate edw down; she states she cramps at times- longer dialysis would help though I doubt we can convince her of that; on dilt 180, MTP 125 bid; lisinopril on hold- continue to titrate volume 6. Anemia -Hgb stable consistent with outpt Hgb, but low overall; continue ESA - due for redose today and ^ weekly Fe-to 125 tsat low on monthly lab - check hemocult 7. Metabolic bone disease - Continue calcitriol- on renvela 3 with meals 2 with snacks 8. Nutrition - needs renal/carb mod/vits/supplements 9. Hx thyroid cancer- on replacement therapy 10. Anxiety - on generous dose of xanax 11. Afib - in NSR on dilt, MTP and Eliquis 12. Hx over FTT overall- pt decline physical therapy at the end of her last hospital discharge; unintentional weight loss, long history of smoking (just quite recently); hx of thyroid cancer  Myriam Jacobson, PA-C [place]  [phone] 10/23/2015,9:48 AM  LOS: 1 day   Pt seen, examined and agree w A/P as above. Chest CT shows bilat pleural effusions, fluid in the fissures and diffuse ground glass change in lower lobes, suspect has sig more volume to get off.  Overall not doing well. Will plan daily HD for now to get volume down. No gross consolidation on CT.  May not have PNA.  Kelly Splinter MD [facility] pager [phone]    cell [phone] 10/23/2015, 12:50 PM    Subjective:   Weak, no appetite- eating bites - breathing better - no chest pressure or pain; she is still worried about need for f'gram.  Objective Filed Vitals:   10/22/15 2106 10/22/15 2247 10/23/15 0534 10/23/15 0756  BP: 173/53 147/62 151/70 164/68  Pulse: 56 79 76 75  Temp:  98 F (36.7 C) 98.1 F (36.7 C) 97.8 F (36.6 C)  TempSrc:  Oral Oral Oral  Resp:    18  Height:  5' 6.5" (1.689 m)    Weight:  67.132 kg (148 lb)    SpO2:  98% 99% 96%   Physical Exam General: frail, weak Heart: RRR 3/6 murmur Lungs: clearer but very poor expansion Abdomen: soft Extremities: tr LE edema Dialysis Access: left upper AVF  Additional Objective Labs: Basic Metabolic Panel:  Recent Labs Lab 10/22/15 1221  NA 140  K 4.9  CL 98*  CO2 27  GLUCOSE 135*  BUN 53*  CREATININE 5.83*  CALCIUM 8.1*   Liver Function Tests:  Recent Labs Lab 10/23/15 0456  AST 19  ALT 10*  ALKPHOS 56  BILITOT 0.5  PROT 5.4*  ALBUMIN 2.4*   CBC:  Recent Labs Lab 10/22/15 1221  WBC 6.7  HGB 8.7*  HCT 29.3*  MCV 93.0  PLT 165  Cardiac Enzymes:  Recent Labs Lab 10/23/15 0020 10/23/15 0456  TROPONINI 0.04* 0.04*   Studies/Results: Dg Chest 2 View  10/22/2015  CLINICAL DATA:  Shortness of breath for 2 weeks.  Initial encounter. EXAM: CHEST  2 VIEW COMPARISON:  Single view of the chest 09/01/2015 and PA and lateral chest 08/29/2015. FINDINGS: There is bilateral airspace disease worst in the lingula, right middle lobe and left upper  lobe. Small pleural effusions are identified. There is cardiomegaly. No pneumothorax. IMPRESSION: Bilateral airspace disease with an appearance most suggestive of multifocal pneumonia rather than pulmonary edema. Associated small bilateral pleural effusions noted. Cardiomegaly. Electronically Signed   By: Inge Rise M.D.   On: 10/22/2015 13:20   Ct Chest Wo Contrast  10/22/2015  CLINICAL DATA:  Shortness of breath, infiltrates, weight loss, history of end-stage renal disease hypertension COPD, patient smokes EXAM: CT CHEST WITHOUT CONTRAST TECHNIQUE: Multidetector CT imaging of the chest was performed following the standard protocol without IV contrast. COMPARISON:  10/22/2015, 07/12/2013 FINDINGS: Mediastinum/Nodes: Pulmonary arteries are prominent suggesting possibility of pulmonary artery hypertension. There is moderate cardiac enlargement with trace pericardial fluid. There is heavy calcification of the aorta and coronary arteries. 13 mm precarinal lymph node. No other significant hilar or mediastinal adenopathy appreciated with on this unenhanced study. Ascending aorta is 4.4 cm. Lungs/Pleura: There is bilateral moderate centrilobular emphysematous change. There is a small right pleural effusion with dependent atelectasis in the right lower lobe. The effusion appears somewhat loculated. There is also a small somewhat loculated left pleural effusion which in total 8 into the oblique fissure. There is dependent atelectasis on the left. There is also infiltrate in the inferolateral lingula which appears most consistent with atelectasis. Upper abdomen: No acute findings Musculoskeletal: No acute musculoskeletal findings IMPRESSION: 1. Loculated bilateral pleural effusions with lower lobe parenchymal opacities. Pneumonia not excluded but the appearance is most consistent with atelectasis 2. COPD 3. Aortic aneurysm similar to prior study. Recommend annual imaging followup by CTA or MRA. This recommendation  follows 2010 ACCF/AHA/AATS/ACR/ASA/SCA/SCAI/SIR/STS/SVM Guidelines for the Diagnosis and Management of Patients with Thoracic Aortic Disease. Circulation. 2010; [id number] Electronically Signed   By: Skipper Cliche M.D.   On: 10/22/2015 17:41   Medications:   . antiseptic oral rinse  7 mL Mouth Rinse BID  . apixaban  5 mg Oral BID  . calcitRIOL  0.25 mcg Oral Q M,W,F-HD  . [START ON 10/24/2015] ceFEPime (MAXIPIME) IV  2 g Intravenous Q M,W,F-1800  . darbepoetin (ARANESP) injection - DIALYSIS  150 mcg Intravenous Q Mon-HD  . diltiazem  240 mg Oral Daily  . [START ON 10/26/2015] ferric gluconate (FERRLECIT/NULECIT) IV  125 mg Intravenous Q Fri-HD  . metoprolol tartrate  12.5 mg Oral BID  . multivitamin  1 tablet Oral QHS  . polyethylene glycol  17 g Oral Daily  . sevelamer carbonate  2,400 mg Oral TID WC  . thyroid  120 mg Oral Daily  . [START ON 10/24/2015] vancomycin  750 mg Intravenous Q M,W,F-HD

## 2015-10-23 NOTE — Progress Notes (Signed)
Family Medicine Teaching Service Daily Progress Note Intern Pager: [phone]  Patient name: Anita [name] Medical record number: [id number] Date of birth: August 09, 1951 Age: 65 y.o. Gender: female  Primary Care Provider: Robert Bellow, MD Consultants: Cardiology, Nephrology Code Status: Full  Pt Overview and Major Events to Date:  Admitted 10/22/15 for chest pain and SOB  Assessment and Plan: Anita [name] is a 65 y.o. female who presented with chest pain and shortness of breath, found to have HCAP and found to be clinically volume overloaded likely due to missed HD today. PMH is significant for aortic stenosis, DM2 Paroxysmal Atrial Fibrillation, ESRD on HD M/W/F, Chronic Diastolic HF (ECHO 123456: EF 65-70%, G2DD), and Nonobstructive CAD.   HCAP with respiratory acidosis with some compensation : Arterial pH 7.341, pCO2 57.1, bicarb 30.9. Normal Lactic Acid. No leukocytosis. CXR: findings suggestive of multifocal PNA. s/p Vancomycin and Cefepime in ED. Currently on home 3L O2 with good saturations and normal respiratory effort. Patient was recently admitted in 08/2015, therefore will treat as a HCAP. Patient does NOT meet sepsis criteria. She remains afebrile.  - Continue vancomycin and cefepime per pharmacy - continuous pulse ox, maintain pulse oximetry > 90.  - CT chest more consistent with atelectasis vs. PNA - blood cultures obtained in ED, pending - Schedule duonebs q6h per patient request - legionella and pneumococcal urinary antigen ordered  Chest pain with history of non-obstructive CAD: Central chest pain without radiation and without associated symptoms. EKG with new T wave inversions mainly in V1-V3. ED Troponin 0.04. Per chart review, prior cath without evidence of obstruction in 2009.  - cardiology consulted, appreciate reccs: discussed over the phone. EKG findings seem nonspecific. Recommended trending troponin and to call back  - trend troponin 0.04 > 0.04 >> - AM EKG with  NSR and PACs - continuing home Metoprolol 12.5mg  BID  Clinically Volume Overloaded: Improved after dialysis. Initially had JVD to angle of the jaw, diminished lung sounds at bases with crackles bilaterally; all improved. Received dialysis last night(usually gets HD M/W/F). Has a history of diastolic heart failure. BNP 2971 on admission. Normal oxygen saturations on room air with normal respiratory effort. EDW 68.5 kg, per nephrology, which she was below on admission.  - nephrology following, appreciate reccs: CT chest to better evaluate volume overload vs PNA and also to rule our malignancy with weight loss; HD 10/22/15 with net removal of 2 L - ECHO ordered - CT showed loculated bilateral pleural effusions and lower lobe opacities likely 2/2 atelectasis; stable AA  ESRD on HD: Has HD on MWF. Reports taking Lasix 80mg  3 tablets on days she is not on HD, but med list reports 1.5 tabs daily. K normal, patient not uremic.  - nephrology following, appreciate recs: as noted above - will hold Lasix for now  - continue home Renvela and calcitriol  Atrial fibrillation: HR stable, currently NSR. Patient is followed by Dr. Harl Bowie at [facility] - continue home Diltiazem  - continue home Eliquis   Anxiety: Home med: Xanax 0.5-1mg  QID. Patient takes 0.5 tab 2-3 times daily and 1 tab at bedtime  - will start with Xanax 0.5mg  daily PRN  - home Ambien 10mg  PRN at bedtime   Hx of Thyroid Cancer: Followed by [facility] - Restart home Nature-thyroid, 97.5 mg x 2 to be given between 2 and 3 a.m.  HTN: Pressures elevated to 160s-170s/50s-60s after dialysis. Home meds: Lasix, Hydralazine 100mg  TID, Lisinopril 40mg  BID, Metoprolol 12.5mg  BID - will continue Metoprolol  for now - Restart home meds for elevated pressures  Normocytic Anemia: hemoglobin 8.7 from 10.9 1 month ago. Elevated RDW at 16.8. No evidence of bleeding clinically - FOBT ordered (in ED)  - will monitor with labs  - ESA with dialysis  ? Mild  Jaundice vs normal hyperpigmentation seen in ESRD: - Hepatic function panel showed low albumin at 2.4, no increased bilirubin  FEN/GI: Renal diet; miralax daily  Prophylaxis: Heparin sub Q  Disposition: admit to teaching service for evaluation of chest pain and management of HCAP and volume overload   Subjective:  [name] has had no further chest pressure since being in the ED. She feels her breathing is a little better this morning. She would like to have more frequent breathing treatments and mentioned a home thyroid medication that she is supposed to be taking.   Objective: Temp:  [97.6 F (36.4 C)-98.2 F (36.8 C)] 98.1 F (36.7 C) (03/07 0534) Pulse Rate:  [56-91] 76 (03/07 0534) Resp:  [17-26] 20 (03/06 2103) BP: (104-173)/(51-97) 151/70 mmHg (03/07 0534) SpO2:  [91 %-100 %] 99 % (03/07 0534) Weight:  [142 lb 13.7 oz (64.8 kg)-148 lb (67.132 kg)] 148 lb (67.132 kg) (03/06 2247) Physical Exam: General: Tired-appearing woman, sitting at edge of bed in NAD Cardiovascular: RRR, 4/6 holosystolic murmur loudest over right upper sternal border. No JVD appreciated.  Respiratory: Maintained saturations when oxygen decreased to home 3L. Bibasilar crackles appreciated L > R intially, which cleared with deep inspiration. Decreased breath sounds at bases. Abdomen: +BS, S, NT, ND Extremities: No LE edema. Strong distal pulses.   Laboratory:  Recent Labs Lab 10/22/15 1221  WBC 6.7  HGB 8.7*  HCT 29.3*  PLT 165    Recent Labs Lab 10/22/15 1221 10/23/15 0456  NA 140  --   K 4.9  --   CL 98*  --   CO2 27  --   BUN 53*  --   CREATININE 5.83*  --   CALCIUM 8.1*  --   PROT  --  5.4*  BILITOT  --  0.5  ALKPHOS  --  56  ALT  --  10*  AST  --  19  GLUCOSE 135*  --    Imaging/Diagnostic Tests: Dg Chest 2 View  10/22/2015  CLINICAL DATA:  Shortness of breath for 2 weeks.  Initial encounter. EXAM: CHEST  2 VIEW COMPARISON:  Single view of the chest 09/01/2015 and PA and  lateral chest 08/29/2015. FINDINGS: There is bilateral airspace disease worst in the lingula, right middle lobe and left upper lobe. Small pleural effusions are identified. There is cardiomegaly. No pneumothorax. IMPRESSION: Bilateral airspace disease with an appearance most suggestive of multifocal pneumonia rather than pulmonary edema. Associated small bilateral pleural effusions noted. Cardiomegaly. Electronically Signed   By: Inge Rise M.D.   On: 10/22/2015 13:20   Ct Chest Wo Contrast  10/22/2015  CLINICAL DATA:  Shortness of breath, infiltrates, weight loss, history of end-stage renal disease hypertension COPD, patient smokes EXAM: CT CHEST WITHOUT CONTRAST TECHNIQUE: Multidetector CT imaging of the chest was performed following the standard protocol without IV contrast. COMPARISON:  10/22/2015, 07/12/2013 FINDINGS: Mediastinum/Nodes: Pulmonary arteries are prominent suggesting possibility of pulmonary artery hypertension. There is moderate cardiac enlargement with trace pericardial fluid. There is heavy calcification of the aorta and coronary arteries. 13 mm precarinal lymph node. No other significant hilar or mediastinal adenopathy appreciated with on this unenhanced study. Ascending aorta is 4.4 cm. Lungs/Pleura: There is bilateral moderate  centrilobular emphysematous change. There is a small right pleural effusion with dependent atelectasis in the right lower lobe. The effusion appears somewhat loculated. There is also a small somewhat loculated left pleural effusion which in total 8 into the oblique fissure. There is dependent atelectasis on the left. There is also infiltrate in the inferolateral lingula which appears most consistent with atelectasis. Upper abdomen: No acute findings Musculoskeletal: No acute musculoskeletal findings IMPRESSION: 1. Loculated bilateral pleural effusions with lower lobe parenchymal opacities. Pneumonia not excluded but the appearance is most consistent with  atelectasis 2. COPD 3. Aortic aneurysm similar to prior study. Recommend annual imaging followup by CTA or MRA. This recommendation follows 2010 ACCF/AHA/AATS/ACR/ASA/SCA/SCAI/SIR/STS/SVM Guidelines for the Diagnosis and Management of Patients with Thoracic Aortic Disease. Circulation. 2010; [id number] Electronically Signed   By: Skipper Cliche M.D.   On: 10/22/2015 17:41    Quay Simkin Corinda Gubler, MD 10/23/2015, 7:12 AM PGY-1, [facility] Intern pager: [phone], text pages welcome

## 2015-10-23 NOTE — Progress Notes (Signed)
Utilization review completed. Caprisha Bridgett, RN, BSN. 

## 2015-10-23 NOTE — Progress Notes (Signed)
[facility]  Patient Status: Active (receiving services up to time of hospitalization)  [facility] is providing the following services: RN, PT, OT, MSW and HHA  If patient discharges after hours, please call [phone].   Anita [name] 10/23/2015, 10:25 AM

## 2015-10-23 NOTE — Consult Note (Addendum)
Reason for Consult:   Chest pain and EKG changes  Requesting Physician: [facility] Service Primary Cardiologist Dr Harl Bowie in [place]  HPI:   65 y.o.female  From [place] with a history of PAF- NSR on Amiodarone and anticoagulated with Eliquis 5 mg BID, ESRD on MWF HD, DM with neuropathy, HTN, non obstructive CAD by cath 2009, thyroid cancer followed at [facility], and recent admission to [facility] with pneumonia Jan 2017. She tells me "I never got over it". Yesterday she was at home when she developed SSCP "like when they pull too fast at dialysis". She denies any radiation to her neck or arms. She asked her husband to bring her to the ED. She told me NTG Sl helped. Her EKG is abnormal at baseline but it appears she has had transient TWI in V2-V4. Her Troponin are mildly elelated- 0.04-0.05. She is pain free currently.   PMHx:  Past Medical History  Diagnosis Date  . Neuropathy due to secondary diabetes ([place])   . Diabetic nephropathy ([place])   . Hypertension   . Anemia   . Dyslipidemia   . Laryngeal mass   . COPD (chronic obstructive pulmonary disease) ([place])   . GERD (gastroesophageal reflux disease)   . Sleep apnea     Does not use CPAP- due to weight loss  . Atrial fibrillation ([place])     Diagnosed 11/14 of undetermined age of onset    . Diabetes mellitus with end stage renal disease ([place])   . CAD (coronary artery disease) 07/13/2013    Catheterization 5 years ago by Dr. Elisabeth Cara with reportedly nonobstructive disease Calcification noted on CT scan of chest in November of 2014   . ESRD on hemodialysis ([place])   . Chronic diastolic heart failure ([place])   . Aortic stenosis 12/23/2012  . 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 Atherosclerosis of aorta ([place])   . PONV (postoperative nausea and vomiting)   . Shortness of breath dyspnea     with exertion  . Wears glasses   . CHF (congestive heart failure) ([place])   . Thyroid neoplasm     of uncertain behavior  . Diabetic  neuropathy [facility])     Past Surgical History  Procedure Laterality Date  . Cholecystectomy    . Appendectomy    . Shoulder arthroscopy Right     w/ repair of rotator cuff repair  . Elbow tendon surgery Right   . Achilles tendon repair Right   . Cesarean section      X 2   . Hemorrhoid surgery      many years ago  . Av fistula placement Left 12/29/2012    Procedure: ARTERIOVENOUS (AV) FISTULA CREATION;  Surgeon: Elam Dutch, MD;  Location: [facility] OR;  Service: Vascular;  Laterality: Left;  Creation Left Brachial Cephalic Arteriovenous Fistula  . Cardiac catheterization    . Laryngectomy      mass removed- noncancerous  . Lumbar laminectomy      lower back  . Thyroidectomy N/A 10/19/2014    Procedure: TOTAL THYROIDECTOMY;  Surgeon: Armandina Gemma, MD;  Location: [facility];  Service: General;  Laterality: N/A;    SOCHx:  reports that she has been smoking Cigarettes and E-cigarettes.  She started smoking about 46 years ago. She has a 20 pack-year smoking history. She has never used smokeless tobacco. She reports that she does not drink alcohol or use illicit drugs.  FAMHx: Family History  Problem Relation  Age of Onset  . COPD Mother   . Thyroid disease Mother   . Multiple sclerosis Father   . Heart disease Father   . Heart attack Father   . Depression Brother     Suicide  . Alcohol abuse Brother   . Heart disease Brother   . Asthma Brother   . Thyroid disease Daughter   . Diabetes Maternal Aunt     ALLERGIES: Allergies  Allergen Reactions  . Doxycycline Nausea And Vomiting  . Lipitor [Atorvastatin] Nausea And Vomiting    ROS: Review of Systems: General: negative for chills, fever, night sweats or weight changes.  Cardiovascular: negative for chest pain, dyspnea on exertion, edema, orthopnea, palpitations, paroxysmal nocturnal dyspnea or shortness of breath HEENT: negative for any visual disturbances, blindness, glaucoma Dermatological: negative for rash Respiratory:  negative for cough, hemoptysis, or wheezing Urologic: negative for hematuria or dysuria Abdominal: negative for nausea, vomiting, diarrhea, bright red blood per rectum, melena, or hematemesis Neurologic: negative for visual changes, syncope, or dizziness Musculoskeletal: negative for back pain, joint pain, or swelling Psych: cooperative and appropriate All other systems reviewed and are otherwise negative except as noted above.   HOME MEDICATIONS: Prior to Admission medications   Medication Sig Start Date End Date Taking? Authorizing Provider  albuterol (PROVENTIL) (2.5 MG/3ML) 0.083% nebulizer solution Take 2.5 mg by nebulization every 6 (six) hours as needed for wheezing or shortness of breath.   Yes Historical Provider, MD  ALPRAZolam Duanne Moron) 1 MG tablet Take 0.5-1 mg by mouth 4 (four) times daily. Patient takes 1/2 tablet 2-3 times daily and 1 tablet at bedtime   Yes Historical Provider, MD  calcium carbonate (TUMS - DOSED IN MG ELEMENTAL CALCIUM) 500 MG chewable tablet Chew 1 tablet by mouth 2 (two) times daily as needed for heartburn (Takes with snacks. In place of [facility]).   Yes Historical Provider, MD  Cyanocobalamin (VITAMIN B 12 PO) Take 1,000 mg by mouth daily.   Yes Historical Provider, MD  diltiazem (TIAZAC) 240 MG 24 hr capsule Take 240 mg by mouth daily.   Yes Historical Provider, MD  ELIQUIS 5 MG TABS tablet Take 5 mg by mouth 2 (two) times daily.  01/01/14  Yes Historical Provider, MD  hydrALAZINE (APRESOLINE) 100 MG tablet Take 100 mg by mouth 3 (three) times daily.   Yes Historical Provider, MD  HYDROcodone-acetaminophen (NORCO) 7.5-325 MG per tablet Take 1 tablet by mouth every 6 (six) hours as needed for pain. 02/15/13  Yes Regina J Roczniak, PA-C  lisinopril (PRINIVIL,ZESTRIL) 40 MG tablet Take 40 mg by mouth 2 (two) times daily.   Yes Historical Provider, MD  meclizine (ANTIVERT) 25 MG tablet Take 25 mg by mouth 4 (four) times daily as needed for dizziness.   Yes Historical  Provider, MD  metoprolol tartrate (LOPRESSOR) 25 MG tablet Take 0.5 tablets (12.5 mg total) by mouth 2 (two) times daily. 09/03/15  Yes Kathie Dike, MD  multivitamin (RENA-VIT) TABS tablet Take 1 tablet by mouth at bedtime. 07/18/13  Yes Blain Pais, MD  omeprazole (PRILOSEC) 20 MG capsule Take 20 mg by mouth daily as needed (acid reflux).    Yes Historical Provider, MD  ondansetron (ZOFRAN) 4 MG tablet Take 4 mg by mouth every 8 (eight) hours as needed.  12/16/14  Yes Historical Provider, MD  polyethylene glycol (MIRALAX / GLYCOLAX) packet Take 17 g by mouth daily.   Yes Historical Provider, MD  sevelamer carbonate (RENVELA) 800 MG tablet Take 2,400 mg by mouth  3 (three) times daily with meals.    Yes Historical Provider, MD  Thyroid (NATURE-THROID) 113.75 MG TABS Take by mouth.   Yes Historical Provider, MD  zolpidem (AMBIEN) 10 MG tablet Take 10 mg by mouth at bedtime as needed for sleep.   Yes Historical Provider, MD  furosemide (LASIX) 80 MG tablet Take 1.5 tablets (120 mg total) by mouth daily. Takes Tues,wed,thurs 09/03/15   Kathie Dike, MD  ipratropium-albuterol (DUONEB) 0.5-2.5 (3) MG/3ML SOLN Take 3 mLs by nebulization every 4 (four) hours as needed. 09/03/15   Kathie Dike, MD    HOSPITAL MEDICATIONS: I have reviewed the patient's current medications.  VITALS: Blood pressure 164/68, pulse 75, temperature 97.8 F (36.6 C), temperature source Oral, resp. rate 18, height 5' 6.5" (1.689 m), weight 148 lb (67.132 kg), SpO2 96 %.  PHYSICAL EXAM: General appearance: alert, cooperative, appears older than stated age and no distress Neck: no JVD and transmitted murmur Lungs: decreased at bses Heart: regular rate and rhythm and [id number] systolic murmur AOV, LSB Abdomen: soft, non-tender; bowel sounds normal; no masses,  no organomegaly Extremities: extremities normal, atraumatic, no cyanosis or edema Pulses: 2+ and symmetric Skin: jaundiced Neurologic: Grossly  normal  LABS: Results for orders placed or performed during the hospital encounter of 10/22/15 (from the past 24 hour(s))  MRSA PCR Screening     Status: None   Collection Time: 10/22/15 11:19 PM  Result Value Ref Range   MRSA by PCR NEGATIVE NEGATIVE  Troponin I (q 6hr x 3)     Status: Abnormal   Collection Time: 10/23/15 12:20 AM  Result Value Ref Range   Troponin I 0.04 (H) <0.031 ng/mL  Troponin I (q 6hr x 3)     Status: Abnormal   Collection Time: 10/23/15  4:56 AM  Result Value Ref Range   Troponin I 0.04 (H) <0.031 ng/mL  Hepatic function panel     Status: Abnormal   Collection Time: 10/23/15  4:56 AM  Result Value Ref Range   Total Protein 5.4 (L) 6.5 - 8.1 g/dL   Albumin 2.4 (L) 3.5 - 5.0 g/dL   AST 19 15 - 41 U/L   ALT 10 (L) 14 - 54 U/L   Alkaline Phosphatase 56 38 - 126 U/L   Total Bilirubin 0.5 0.3 - 1.2 mg/dL   Bilirubin, Direct 0.2 0.1 - 0.5 mg/dL   Indirect Bilirubin 0.3 0.3 - 0.9 mg/dL  Troponin I (q 6hr x 3)     Status: Abnormal   Collection Time: 10/23/15 10:07 AM  Result Value Ref Range   Troponin I 0.05 (H) <0.031 ng/mL    EKG: NSR, with LVH by voltage, TWI V2-V4 on 10/22/15- not seen 10/23/14  IMAGING: Dg Chest 2 View  10/22/2015  CLINICAL DATA:  Shortness of breath for 2 weeks.  Initial encounter. EXAM: CHEST  2 VIEW COMPARISON:  Single view of the chest 09/01/2015 and PA and lateral chest 08/29/2015. FINDINGS: There is bilateral airspace disease worst in the lingula, right middle lobe and left upper lobe. Small pleural effusions are identified. There is cardiomegaly. No pneumothorax. IMPRESSION: Bilateral airspace disease with an appearance most suggestive of multifocal pneumonia rather than pulmonary edema. Associated small bilateral pleural effusions noted. Cardiomegaly. Electronically Signed   By: Inge Rise M.D.   On: 10/22/2015 13:20   Ct Chest Wo Contrast  10/22/2015  CLINICAL DATA:  Shortness of breath, infiltrates, weight loss, history of  end-stage renal disease hypertension COPD, patient smokes EXAM: CT CHEST  WITHOUT CONTRAST TECHNIQUE: Multidetector CT imaging of the chest was performed following the standard protocol without IV contrast. COMPARISON:  10/22/2015, 07/12/2013 FINDINGS: Mediastinum/Nodes: Pulmonary arteries are prominent suggesting possibility of pulmonary artery hypertension. There is moderate cardiac enlargement with trace pericardial fluid. There is heavy calcification of the aorta and coronary arteries. 13 mm precarinal lymph node. No other significant hilar or mediastinal adenopathy appreciated with on this unenhanced study. Ascending aorta is 4.4 cm. Lungs/Pleura: There is bilateral moderate centrilobular emphysematous change. There is a small right pleural effusion with dependent atelectasis in the right lower lobe. The effusion appears somewhat loculated. There is also a small somewhat loculated left pleural effusion which in total 8 into the oblique fissure. There is dependent atelectasis on the left. There is also infiltrate in the inferolateral lingula which appears most consistent with atelectasis. Upper abdomen: No acute findings Musculoskeletal: No acute musculoskeletal findings IMPRESSION: 1. Loculated bilateral pleural effusions with lower lobe parenchymal opacities. Pneumonia not excluded but the appearance is most consistent with atelectasis 2. COPD 3. Aortic aneurysm similar to prior study. Recommend annual imaging followup by CTA or MRA. This recommendation follows 2010 ACCF/AHA/AATS/ACR/ASA/SCA/SCAI/SIR/STS/SVM Guidelines for the Diagnosis and Management of Patients with Thoracic Aortic Disease. Circulation. 2010; [id number] Electronically Signed   By: Skipper Cliche M.D.   On: 10/22/2015 17:41    IMPRESSION: Principal Problem:   Chest pain with moderate risk of acute coronary syndrome Active Problems:   CAD- non obstructive CAD 2009   PAF (paroxysmal atrial fibrillation) (HCC)   Aortic stenosis    Diabetes mellitus with end stage renal disease ([place])   ESRD on hemodialysis ([place])   Long-term (current) use of anticoagulants   HCAP- Jan 2017   Elevated troponin   Essential hypertension   Abnormal EKG   Pain in the chest   ESRD (end stage renal disease) ([place])   Pulmonary infiltrate   RECOMMENDATION: Her symptoms are worrisome for angina, EKG changes could be non specific. I would expect her Troponin to be higher after 2 hrs of chest pain. MD to see- agree with echo- ? Myoview in am.   Time Spent Directly with Patient: 49 minutes  Kerin Ransom, [place] be[place] 10/23/2015, 4:47 PM    I have seen, examined and evaluated the patient this PM  along with Mr. Rosalyn Gess, [place].  After reviewing all the available data and chart,  I agree with his findings, examination as well as impression recommendations.    65 year old woman who appears to be much older than stated age followed by Dr. branch in the [place] for PAF. She is on ELIQUIS and amiodarone. She presented with a prolonged episode roughly 2 hours of resting chest discomfort yesterday. She apparently was recently hospitalized with pneumonia back in January and never has recovered fully. She is now profoundly dyspneic simply walking to the bathroom. However she has not had any exertional chest pain up until yesterday. Yesterday her episode was with with rest and not necessarily any exacerbating or alleviating factors until she got here to the hospital. She is not had any further symptoms since being in the hospital. The episode actually was on Sunday and now is not any further symptoms. She has had some transient ST segment/T-wave segment changes on EKG associated with her pain.  She currently is chest pain-free.  On exam I hear a harsh systolic murmur but otherwise no gross abnormalities besides her chronic ill appearance.  I had a long talk with her about plans to evaluate  this. I think we should at least to tachycardia and to  assess the aortic valve for any worsening stenosis. Concerned about this being potential ischemic heart disease. We talked about either stress test versus cardiac catheterization. At this point she is reluctant to have any further evaluation done. She does not want holder ELIQUIS for heart catheterization. She would prefer not to do heart catheterization at this point. She would also prefer not to do a nuclear stress test at this point. With that discussion being had. I recommended that if she is here tomorrow for dialysis we at least do an echocardiogram to evaluate her EF and a aortic valve.  I would put her on low-dose nitrate. She can then follow up with Dr. Harl Bowie in the [place] office and they can discuss the possibility of outpatient nuclear stress testing versus cardiac catheterization.  She is on ELIQUIS and therefore not on aspirin and Plavix. She is already on low-dose Lopressor for rate control with diltiazem. Both these are also providing it anginal effect. No signs of heart failure at this point, and she is dialysis dependent. She describes a symptom of that she had as being similar to when she has too much volume taken off during dialysis ".  I do think an ischemic evaluation is warranted since it has been 9 years since her last cardiac evaluation and clearly with her end-stage renal disease, hypertension and hyperlipidemia she is at risk for progressive CAD.  We will look for the results of the echocardiogram. And help set her up to see Dr. Harl Bowie.   Between myself and Mr. Rosalyn Gess, total of 55-60 minutes was spent with the patient. Greater than 50% of this was in direct counseling discussing potential diagnostic options and treatment plans. I went through each potential option, and despite her decisions reiterated my concerns.    Leonie Man, M.D., M.S. Interventional Cardiologist   Pager # [phone] Phone # [phone] [address]. [place],   [address]

## 2015-10-24 ENCOUNTER — Ambulatory Visit (HOSPITAL_COMMUNITY): Payer: Medicare Other

## 2015-10-24 DIAGNOSIS — R0600 Dyspnea, unspecified: Secondary | ICD-10-CM

## 2015-10-24 DIAGNOSIS — I350 Nonrheumatic aortic (valve) stenosis: Secondary | ICD-10-CM

## 2015-10-24 DIAGNOSIS — I25119 Atherosclerotic heart disease of native coronary artery with unspecified angina pectoris: Secondary | ICD-10-CM

## 2015-10-24 LAB — ECHOCARDIOGRAM COMPLETE
AO mean calculated velocity dopler: 274 cm/s
AOPV: 0.3 m/s
AV Mean grad: 37 mmHg
AV vel: 1.16
AVLVOTPG: 7 mmHg
AVPG: 78 mmHg
AVPHT: 384 ms
CHL CUP AV PEAK INDEX: 0.56
E decel time: 363 msec
FS: 38 % (ref 28–44)
Height: 66.5 in
IVS/LV PW RATIO, ED: 0.98
LEFT ATRIUM END SYS DIAM: 51 cm
LVOT area: 3.14 cm2
LVOTPV: 133 m/s
LVOTVTI: 0.37 cm
MV Peak grad: 5 mmHg
MVPKAVEL: 109 m/s
MVPKEVEL: 109 m/s
PW: 11.5 mm — AB (ref 0.6–1.1)
Stroke v: 115 ml
VTI: 99.2 cm
Valve area index: 0.69
Valve area: 1.16 cm2
WEIGHTICAEL: 2081.14 [oz_av]

## 2015-10-24 LAB — CBC
HEMATOCRIT: 29.7 % — AB (ref 36.0–46.0)
Hemoglobin: 9.2 g/dL — ABNORMAL LOW (ref 12.0–15.0)
MCH: 28.4 pg (ref 26.0–34.0)
MCHC: 31 g/dL (ref 30.0–36.0)
MCV: 91.7 fL (ref 78.0–100.0)
PLATELETS: 175 10*3/uL (ref 150–400)
RBC: 3.24 MIL/uL — AB (ref 3.87–5.11)
RDW: 16.5 % — ABNORMAL HIGH (ref 11.5–15.5)
WBC: 5 10*3/uL (ref 4.0–10.5)

## 2015-10-24 MED ORDER — LEVOFLOXACIN 750 MG PO TABS
750.0000 mg | ORAL_TABLET | Freq: Every day | ORAL | Status: DC
Start: 2015-10-25 — End: 2015-10-25

## 2015-10-24 MED ORDER — ISOSORBIDE MONONITRATE ER 30 MG PO TB24: 15.0000 mg | ORAL_TABLET | Freq: Every day | ORAL | Status: DC

## 2015-10-24 MED ORDER — NEPRO/CARBSTEADY PO LIQD
237.0000 mL | Freq: Two times a day (BID) | ORAL | Status: DC
Start: 2015-10-24 — End: 2015-10-25
  Administered 2015-10-24 (×2): 237 mL via ORAL
  Filled 2015-10-24 (×2): qty 237

## 2015-10-24 MED ORDER — CALCITRIOL 0.25 MCG PO CAPS
ORAL_CAPSULE | ORAL | Status: AC
  Filled 2015-10-24: qty 1

## 2015-10-24 MED ORDER — NITROGLYCERIN 0.4 MG SL SUBL: 0.4000 mg | SUBLINGUAL_TABLET | SUBLINGUAL | Status: DC | PRN

## 2015-10-24 MED ORDER — LEVOFLOXACIN 500 MG PO TABS: 500.0000 mg | ORAL_TABLET | Freq: Every day | ORAL | Status: DC

## 2015-10-24 MED ORDER — IPRATROPIUM-ALBUTEROL 0.5-2.5 (3) MG/3ML IN SOLN
3.0000 mL | Freq: Three times a day (TID) | RESPIRATORY_TRACT | Status: DC
  Administered 2015-10-24 (×2): 3 mL via RESPIRATORY_TRACT
  Filled 2015-10-24 (×2): qty 3

## 2015-10-24 MED ORDER — THYROID 97.5 MG PO TABS: 2.0000 | ORAL_TABLET | Freq: Every day | ORAL | Status: DC

## 2015-10-24 MED ORDER — VANCOMYCIN HCL IN DEXTROSE 750-5 MG/150ML-% IV SOLN
INTRAVENOUS | Status: AC
  Filled 2015-10-24: qty 150

## 2015-10-24 NOTE — Progress Notes (Signed)
Subjective:  No chest pain, seen on dialysis, she wants to go home after her echo.   Objective:  Vital Signs in the last 24 hours: Temp:  [97.4 F (36.3 C)-98.6 F (37 C)] 98.6 F (37 C) (03/08 0658) Pulse Rate:  [63-90] 64 (03/08 0930) Resp:  [15-27] 24 (03/08 0930) BP: (94-167)/(51-78) 148/57 mmHg (03/08 0930) SpO2:  [95 %-100 %] 98 % (03/08 0658) Weight:  [136 lb 7.4 oz (61.9 kg)-144 lb 13.5 oz (65.7 kg)] 136 lb 7.4 oz (61.9 kg) (03/08 0658)  Intake/Output from previous day:  Intake/Output Summary (Last 24 hours) at 10/24/15 0945 Last data filed at 10/24/15 0244  Gross per 24 hour  Intake    720 ml  Output   2000 ml  Net  -1280 ml    Physical Exam: General appearance: alert, cooperative, appears older than stated age and no distress Lungs: clear to auscultation bilaterally Heart: regular rate and rhythm and 2/6 systolic murmur Abdomen: Soft/NT/ND/NABS Neuro: Grossly intact Extremities: No C/C/E.   Rate: 66  Rhythm: normal sinus rhythm  Lab Results:  Recent Labs  10/22/15 1221 10/24/15 0558  WBC 6.7 5.0  HGB 8.7* 9.2*  PLT 165 175    Recent Labs  10/22/15 1221 10/23/15 1840  NA 140 134*  K 4.9 4.2  CL 98* 95*  CO2 27 28  GLUCOSE 135* 82  BUN 53* 26*  CREATININE 5.83* 3.65*    Recent Labs  10/23/15 0456 10/23/15 1007  TROPONINI 0.04* 0.05*    Recent Labs  10/22/15 1244  INR 1.05    Scheduled Meds: . antiseptic oral rinse  7 mL Mouth Rinse BID  . apixaban  5 mg Oral BID  . calcitRIOL  0.25 mcg Oral Q M,W,F-HD  . ceFEPime (MAXIPIME) IV  2 g Intravenous Q M,W,F-1800  . darbepoetin (ARANESP) injection - DIALYSIS  150 mcg Intravenous Q Mon-HD  . diltiazem  240 mg Oral Daily  . feeding supplement (NEPRO CARB STEADY)  237 mL Oral BID BM  . [START ON 10/26/2015] ferric gluconate (FERRLECIT/NULECIT) IV  125 mg Intravenous Q Fri-HD  . ipratropium-albuterol  3 mL Nebulization TID  . isosorbide mononitrate  15 mg Oral QHS  . metoprolol  tartrate  12.5 mg Oral BID  . multivitamin  1 tablet Oral QHS  . polyethylene glycol  17 g Oral Daily  . sevelamer carbonate  2,400 mg Oral TID WC  . Thyroid  2 tablet Oral QAC breakfast  . vancomycin  750 mg Intravenous Q M,W,F-HD   Continuous Infusions:  PRN Meds:.sodium chloride, sodium chloride, ALPRAZolam, alteplase, heparin, heparin, lidocaine (PF), nitroGLYCERIN, pentafluoroprop-tetrafluoroeth, zolpidem   Imaging: Dg Chest 2 View  10/22/2015  CLINICAL DATA:  Shortness of breath for 2 weeks.  Initial encounter. EXAM: CHEST  2 VIEW COMPARISON:  Single view of the chest 09/01/2015 and PA and lateral chest 08/29/2015. FINDINGS: There is bilateral airspace disease worst in the lingula, right middle lobe and left upper lobe. Small pleural effusions are identified. There is cardiomegaly. No pneumothorax. IMPRESSION: Bilateral airspace disease with an appearance most suggestive of multifocal pneumonia rather than pulmonary edema. Associated small bilateral pleural effusions noted. Cardiomegaly. Electronically Signed   By: Inge Rise M.D.   On: 10/22/2015 13:20   Ct Chest Wo Contrast  10/22/2015  CLINICAL DATA:  Shortness of breath, infiltrates, weight loss, history of end-stage renal disease hypertension COPD, patient smokes EXAM: CT CHEST WITHOUT CONTRAST TECHNIQUE: Multidetector CT imaging of the chest was performed following  the standard protocol without IV contrast. COMPARISON:  10/22/2015, 07/12/2013 FINDINGS: Mediastinum/Nodes: Pulmonary arteries are prominent suggesting possibility of pulmonary artery hypertension. There is moderate cardiac enlargement with trace pericardial fluid. There is heavy calcification of the aorta and coronary arteries. 13 mm precarinal lymph node. No other significant hilar or mediastinal adenopathy appreciated with on this unenhanced study. Ascending aorta is 4.4 cm. Lungs/Pleura: There is bilateral moderate centrilobular emphysematous change. There is a small  right pleural effusion with dependent atelectasis in the right lower lobe. The effusion appears somewhat loculated. There is also a small somewhat loculated left pleural effusion which in total 8 into the oblique fissure. There is dependent atelectasis on the left. There is also infiltrate in the inferolateral lingula which appears most consistent with atelectasis. Upper abdomen: No acute findings Musculoskeletal: No acute musculoskeletal findings IMPRESSION: 1. Loculated bilateral pleural effusions with lower lobe parenchymal opacities. Pneumonia not excluded but the appearance is most consistent with atelectasis 2. COPD 3. Aortic aneurysm similar to prior study. Recommend annual imaging followup by CTA or MRA. This recommendation follows 2010 ACCF/AHA/AATS/ACR/ASA/SCA/SCAI/SIR/STS/SVM Guidelines for the Diagnosis and Management of Patients with Thoracic Aortic Disease. Circulation. 2010; [id number] Electronically Signed   By: Skipper Cliche M.D.   On: 10/22/2015 17:41    Assessment/Plan:    Principal Problem:   Chest pain with moderate risk of acute coronary syndrome Active Problems:   Abnormal EKG   Aortic stenosis   Diabetes mellitus with end stage renal disease (HCC)   CAD- non obstructive CAD 2009   ESRD on hemodialysis ([place])   Long-term (current) use of anticoagulants   Essential hypertension   PAF (paroxysmal atrial fibrillation) ([place])   HCAP- Jan 2017   Elevated troponin   Pain in the chest   ESRD (end stage renal disease) ([place])   Pulmonary infiltrate   PLAN: Pt reiterated her desire to avoid any further work up for chest pain -no Myoview or cath. She can be discharged after her echo is done- f/u results with Dr Harl Bowie in [place].   Kerin Ransom PA-C 10/24/2015, 9:45 AM [phone]  I saw evaluated the patient today after being seen by Mr. Rosalyn Gess, [place]. She continues to be relatively consistent with her decision about not having a Myoview or catheterization done. She is  interested in seeing the results of her echocardiogram however.  No further chest pain. Low level troponin in a dialysis patient is very nonspecific and probably not very meaningful. However, with her symptoms of prolonged chest pain, I do think did place an outpatient ischemic evaluation is warranted.  She remains on ELIQUIS for A. fib, but is in sinus rhythm.  Blood pressure and heart rate controlled on current medications. Continue current dose of beta blocker diltiazem.  She is on antibiotics per medicine service. Otherwise cardiac standpoint she is okay for discharge. Will defer to teaching service.   She will need follow-up with Dr. Harl Bowie At the [facility] office  on discharge   [facility], Leonie Green, M.D., M.S. Interventional Cardiologist   Pager # [phone] Phone # [phone] [address] [place], [place] [address]

## 2015-10-24 NOTE — Progress Notes (Signed)
[facility] Progress Note  Dialysis Orders: [facility] MWF 3.25 hour EDW 64 2K 2.25 Ca profile 2 400/800 hyeparin 4000 left upper AVF venofer 50/week Fridays Mircera 150 q 2 - was 100 q 2 - l;ast dose 2/200 calcitriol 0.25 Recent labs: Hgb 8.8 16% sat 2/13 - got venofer 100 x 3 then 50/week - last ferritin 679 - review looks like delay in increasing ESA and probably needs more Fe and hemocult  Assessment/Plan: 1. HCAP - per primary; empiric Vanc and Cefepime after cultures; Chest CT not convincing for PNA; emphysematous changes; bilateral loculated small pleural effusions;  2. SOB/COPD -long smoking history - stopped relatively recently - emphysematous changes; suspect symptoms were related to excess volume - 2 L off Monday - post weight 64.8 (bed scale); had 2nd extra tmt Tuesday with net UF 2 L -post HD wt 61.9 - pre HD today 61.8 (prior EDW 64)  - goal ordered as 3.5 L - Had initial BP drop into 90s.  Goal lowered to 3 L - told RN to reduce to 2500 if BP drops again..   3. Chest pressure - seen by cards yesterday - for Echo and possible  Myoview, the later possibly as an outpatient- would be good if she could have both done now 4. ESRD - MWF - HD Wed; no acute need for fistulagram- tried to reassure her this was not needed at this time; Explained why longer time is better and agrees to stay on for 3.5 hours today - labs pending 5. Hypertension/volume - titrating edw down; she states she cramps at times- longer dialysis would help though I doubt we can convince her of that; on dilt 180, MTP 125 bid; lisinopril on hold- continue to titrate volume with serial dialysis 6. Anemia -Hgb 9.2  but low overall; continue ESA q Monday ^ weekly Fe-to 125 tsat low on monthly lab - check hemocult 7. Metabolic bone disease - Continue calcitriol- on renvela 3 with meals 2 with snacks- labs pending 8. Nutrition - needs renal/carb mod/vits/supplements; progressive unintentional  weight loss 9. Hx thyroid cancer- on replacement therapy 10. Anxiety - on generous dose of xanax 11. Afib - in NSR on dilt, MTP and Eliquis 12. Hx over FTT overall- pt decline physical therapy at the end of her last hospital discharge; unintentional weight loss, long history of smoking (just quite recently); hx of thyroid cancer  Myriam Jacobson, PA-C [place] [phone] 10/24/2015,8:20 AM  LOS: 2 days   Pt seen, examined, agree w assess/plan as above with additions as indicated.  Kelly Splinter MD [facility] pager [phone]    cell [phone] 10/24/2015, 5:09 PM   Subjective:   Asked HD RN to get off early. Said she cramped last night. Tells me she didn't cramp but wants off early. Coughing less.   Objective Filed Vitals:   10/24/15 0658 10/24/15 0703 10/24/15 0730 10/24/15 0800  BP: 124/69 94/75 125/75 147/68  Pulse: 63 64 72 90  Temp: 98.6 F (37 C)     TempSrc: Oral     Resp: 15 16 23 27   Height:      Weight: 61.9 kg (136 lb 7.4 oz)     SpO2: 98%      Physical Exam General:anxious, many questions on HD breathing easily Heart: irreg irreg 3/6 murmur Lungs: grossly clear Abdomen: soft NT Extremities: no LE edema Dialysis Access: left upper AVF  Additional Objective Labs: Basic Metabolic Panel:  Recent Labs Lab 10/22/15  1221 10/23/15 1840  NA 140 134*  K 4.9 4.2  CL 98* 95*  CO2 27 28  GLUCOSE 135* 82  BUN 53* 26*  CREATININE 5.83* 3.65*  CALCIUM 8.1* 8.1*  PHOS  --  5.3*   Liver Function Tests:  Recent Labs Lab 10/23/15 0456 10/23/15 1840  AST 19  --   ALT 10*  --   ALKPHOS 56  --   BILITOT 0.5  --   PROT 5.4*  --   ALBUMIN 2.4* 2.3*   CBC:  Recent Labs Lab 10/22/15 1221 10/24/15 0558  WBC 6.7 5.0  HGB 8.7* 9.2*  HCT 29.3* 29.7*  MCV 93.0 91.7  PLT 165 175   Blood Culture    Component Value Date/Time   SDES BLOOD RIGHT HAND 10/22/2015 1450   SPECREQUEST BOTTLES DRAWN AEROBIC AND ANAEROBIC 5CC  10/22/2015 1450   CULT NO GROWTH < 24 HOURS 10/22/2015 1450   REPTSTATUS PENDING 10/22/2015 1450    Cardiac Enzymes:  Recent Labs Lab 10/23/15 0020 10/23/15 0456 10/23/15 1007  TROPONINI 0.04* 0.04* 0.05*   Studies/Results: Dg Chest 2 View  10/22/2015  CLINICAL DATA:  Shortness of breath for 2 weeks.  Initial encounter. EXAM: CHEST  2 VIEW COMPARISON:  Single view of the chest 09/01/2015 and PA and lateral chest 08/29/2015. FINDINGS: There is bilateral airspace disease worst in the lingula, right middle lobe and left upper lobe. Small pleural effusions are identified. There is cardiomegaly. No pneumothorax. IMPRESSION: Bilateral airspace disease with an appearance most suggestive of multifocal pneumonia rather than pulmonary edema. Associated small bilateral pleural effusions noted. Cardiomegaly. Electronically Signed   By: Inge Rise M.D.   On: 10/22/2015 13:20   Ct Chest Wo Contrast  10/22/2015  CLINICAL DATA:  Shortness of breath, infiltrates, weight loss, history of end-stage renal disease hypertension COPD, patient smokes EXAM: CT CHEST WITHOUT CONTRAST TECHNIQUE: Multidetector CT imaging of the chest was performed following the standard protocol without IV contrast. COMPARISON:  10/22/2015, 07/12/2013 FINDINGS: Mediastinum/Nodes: Pulmonary arteries are prominent suggesting possibility of pulmonary artery hypertension. There is moderate cardiac enlargement with trace pericardial fluid. There is heavy calcification of the aorta and coronary arteries. 13 mm precarinal lymph node. No other significant hilar or mediastinal adenopathy appreciated with on this unenhanced study. Ascending aorta is 4.4 cm. Lungs/Pleura: There is bilateral moderate centrilobular emphysematous change. There is a small right pleural effusion with dependent atelectasis in the right lower lobe. The effusion appears somewhat loculated. There is also a small somewhat loculated left pleural effusion which in total 8 into  the oblique fissure. There is dependent atelectasis on the left. There is also infiltrate in the inferolateral lingula which appears most consistent with atelectasis. Upper abdomen: No acute findings Musculoskeletal: No acute musculoskeletal findings IMPRESSION: 1. Loculated bilateral pleural effusions with lower lobe parenchymal opacities. Pneumonia not excluded but the appearance is most consistent with atelectasis 2. COPD 3. Aortic aneurysm similar to prior study. Recommend annual imaging followup by CTA or MRA. This recommendation follows 2010 ACCF/AHA/AATS/ACR/ASA/SCA/SCAI/SIR/STS/SVM Guidelines for the Diagnosis and Management of Patients with Thoracic Aortic Disease. Circulation. 2010; 121SP:[id number] Electronically Signed   By: Skipper Cliche M.D.   On: 10/22/2015 17:41   Medications:   . antiseptic oral rinse  7 mL Mouth Rinse BID  . apixaban  5 mg Oral BID  . calcitRIOL  0.25 mcg Oral Q M,W,F-HD  . ceFEPime (MAXIPIME) IV  2 g Intravenous Q M,W,F-1800  . darbepoetin (ARANESP) injection - DIALYSIS  150  mcg Intravenous Q Mon-HD  . diltiazem  240 mg Oral Daily  . [START ON 10/26/2015] ferric gluconate (FERRLECIT/NULECIT) IV  125 mg Intravenous Q Fri-HD  . ipratropium-albuterol  3 mL Nebulization TID  . isosorbide mononitrate  15 mg Oral QHS  . metoprolol tartrate  12.5 mg Oral BID  . multivitamin  1 tablet Oral QHS  . polyethylene glycol  17 g Oral Daily  . sevelamer carbonate  2,400 mg Oral TID WC  . Thyroid  2 tablet Oral QAC breakfast  . Vancomycin      . vancomycin  750 mg Intravenous Q M,W,F-HD

## 2015-10-24 NOTE — Progress Notes (Signed)
Family Medicine Teaching Service Daily Progress Note Intern Pager: [phone]  Patient name: Anita [name] Medical record number: [id number] Date of birth: 22-Dec-1950 Age: 65 y.o. Gender: female  Primary Care Provider: Robert Bellow, MD Consultants: Cardiology, Nephrology Code Status: Full  Pt Overview and Major Events to Date:  Admitted 10/22/15 for chest pain and SOB  Assessment and Plan: Anita [name] is a 65 y.o. female who presented with chest pain and shortness of breath, found to have HCAP and found to be clinically volume overloaded likely due to missed HD today. PMH is significant for aortic stenosis, DM2 Paroxysmal Atrial Fibrillation, ESRD on HD M/W/F, Chronic Diastolic HF (ECHO 123456: EF 65-70%, G2DD), and Nonobstructive CAD.   Possible HCAP with respiratory acidosis with some compensation: Afebrile, no leukocytosis. Arterial pH 7.341, pCO2 57.1, bicarb 30.9. Normal Lactic Acid. No leukocytosis. CXR: findings suggestive of multifocal PNA. s/p Vancomycin and Cefepime in ED. Currently on home 3L O2 with good saturations and normal respiratory effort. Patient was recently admitted in 08/2015, therefore will treat as a HCAP. Patient does NOT meet sepsis criteria. She remains afebrile.  - Continue vancomycin and cefepime per pharmacy; will discharge on levaquin - continuous pulse ox, maintain pulse oximetry > 90.  - CT chest more consistent with atelectasis vs. PNA - blood cultures obtained in ED 10/22/15, NGTD - Scheduled duonebs TID per patient request - legionella and pneumococcal urinary antigen ordered  Chest pain with history of non-obstructive CAD: Central chest pain without radiation and without associated symptoms. EKG with new T wave inversions mainly in V1-V3. ED Troponin 0.04. Per chart review, prior cath without evidence of obstruction in 2009.  - cardiology consulted, appreciate reccs: suggested myoview vs. cath to rule out ischemia but patient refused. Recommended  starting low-dose imdur.  - trend troponin 0.04 > 0.04 >> 0.05 - AM EKG with NSR and PACs - continuing home Metoprolol 12.5mg  BID  Clinically Volume Overloaded: Improved after dialysis day of admission, Monday. Initially had JVD to angle of the jaw, diminished lung sounds at bases with crackles bilaterally; all improved. Usually gets HD M/W/F. Has a history of diastolic heart failure. BNP 2971 on admission. Normal oxygen saturations on home 3L Anita [name] with normal respiratory effort. EDW 68.5 kg, per nephrology, which she was below on admission.  - nephrology following, appreciate reccs: Will undergo HD today - ECHO ordered - CT showed loculated bilateral pleural effusions and lower lobe opacities likely 2/2 atelectasis; stable AA  ESRD on HD: Has HD on MWF. Reports taking Lasix 80mg  3 tablets on days she is not on HD, but med list reports 1.5 tabs daily. K normal, patient not uremic.  - nephrology following, appreciate recs: as noted above - will hold Lasix for now  - continue home Renvela and calcitriol  Atrial fibrillation: HR stable, currently NSR. Patient is followed by Dr. Harl [facility]ie at [facility] - continue home Diltiazem  - continue home Eliquis   Anxiety: Home med: Xanax 0.5-1mg  QID. Patient takes 0.5 tab 2-3 times daily and 1 tab at bedtime  - will start with Xanax 0.5mg  daily PRN  - home Ambien 10mg  PRN at bedtime   Hx of Thyroid Cancer: Followed by [facility] - Restart home Nature-thyroid, 97.5 mg x 2 to be given between 2 and 3 a.m.  HTN: Systolic pressure fluctuates between 120s and 150s. Home meds: Lasix, Hydralazine 100mg  TID, Lisinopril 40mg  BID, Metoprolol 12.5mg  BID - will continue Metoprolol for now - Restart home meds once discharged  Normocytic  Anemia: hemoglobin 9.2, up from 8.7 on admission, down from 10.9 one month ago. No evidence of bleeding clinically - FOBT ordered (in ED)  - will monitor with labs  - ESA with dialysis  ? Mild Jaundice vs normal  hyperpigmentation seen in ESRD: - Hepatic function panel showed low albumin at 2.4, no increased bilirubin  FEN/GI: Renal diet; miralax daily  Prophylaxis: Eliquis  Disposition: admit to teaching service for evaluation of chest pain and management of HCAP and volume overload   Subjective:  [name] denies chest pain and says shortness of breath is much improved. She would like to go home today if possible and continue cardiac workup outpatient.   Objective: Temp:  [97.4 F (36.3 C)-98.6 F (37 C)] 98.6 F (37 C) (03/08 0658) Pulse Rate:  [63-80] 64 (03/08 0703) Resp:  [15-24] 16 (03/08 0703) BP: (94-167)/(51-78) 94/75 mmHg (03/08 0703) SpO2:  [95 %-100 %] 98 % (03/08 0658) Weight:  [136 lb 7.4 oz (61.9 kg)-144 lb 13.5 oz (65.7 kg)] 136 lb 7.4 oz (61.9 kg) (03/08 [id number]) Physical Exam: General: Tired-appearing woman, sitting at edge of bed in NAD Cardiovascular: RRR, 3/6 holosystolic murmur loudest over right upper sternal border. JVD to mid-neck.  Respiratory: Bibasilar crackles appreciated L > R. Decreased breath sounds at bases. Abdomen: +BS, S, NT, ND Extremities: No LE edema. Strong distal pulses.   Laboratory:  Recent Labs Lab 10/22/15 1221  WBC 6.7  HGB 8.7*  HCT 29.3*  PLT 165    Recent Labs Lab 10/22/15 1221 10/23/15 0456 10/23/15 1840  NA 140  --  134*  K 4.9  --  4.2  CL 98*  --  95*  CO2 27  --  28  BUN 53*  --  26*  CREATININE 5.83*  --  3.65*  CALCIUM 8.1*  --  8.1*  PROT  --  5.4*  --   BILITOT  --  0.5  --   ALKPHOS  --  56  --   ALT  --  10*  --   AST  --  19  --   GLUCOSE 135*  --  82   Imaging/Diagnostic Tests: Dg Chest 2 View  10/22/2015  CLINICAL DATA:  Shortness of breath for 2 weeks.  Initial encounter. EXAM: CHEST  2 VIEW COMPARISON:  Single view of the chest 09/01/2015 and PA and lateral chest 08/29/2015. FINDINGS: There is bilateral airspace disease worst in the lingula, right middle lobe and left upper lobe. Small pleural effusions  are identified. There is cardiomegaly. No pneumothorax. IMPRESSION: Bilateral airspace disease with an appearance most suggestive of multifocal pneumonia rather than pulmonary edema. Associated small bilateral pleural effusions noted. Cardiomegaly. Electronically Signed   By: Inge Rise M.D.   On: 10/22/2015 13:20   Ct Chest Wo Contrast  10/22/2015  CLINICAL DATA:  Shortness of breath, infiltrates, weight loss, history of end-stage renal disease hypertension COPD, patient smokes EXAM: CT CHEST WITHOUT CONTRAST TECHNIQUE: Multidetector CT imaging of the chest was performed following the standard protocol without IV contrast. COMPARISON:  10/22/2015, 07/12/2013 FINDINGS: Mediastinum/Nodes: Pulmonary arteries are prominent suggesting possibility of pulmonary artery hypertension. There is moderate cardiac enlargement with trace pericardial fluid. There is heavy calcification of the aorta and coronary arteries. 13 mm precarinal lymph node. No other significant hilar or mediastinal adenopathy appreciated with on this unenhanced study. Ascending aorta is 4.4 cm. Lungs/Pleura: There is bilateral moderate centrilobular emphysematous change. There is a small right pleural effusion with dependent atelectasis in  the right lower lobe. The effusion appears somewhat loculated. There is also a small somewhat loculated left pleural effusion which in total 8 into the oblique fissure. There is dependent atelectasis on the left. There is also infiltrate in the inferolateral lingula which appears most consistent with atelectasis. Upper abdomen: No acute findings Musculoskeletal: No acute musculoskeletal findings IMPRESSION: 1. Loculated bilateral pleural effusions with lower lobe parenchymal opacities. Pneumonia not excluded but the appearance is most consistent with atelectasis 2. COPD 3. Aortic aneurysm similar to prior study. Recommend annual imaging followup by CTA or MRA. This recommendation follows 2010  ACCF/AHA/AATS/ACR/ASA/SCA/SCAI/SIR/STS/SVM Guidelines for the Diagnosis and Management of Patients with Thoracic Aortic Disease. Circulation. 2010; [id number] Electronically Signed   By: Skipper Cliche M.D.   On: 10/22/2015 17:41    Ekin Pilar Corinda Gubler, MD 10/24/2015, 7:33 AM PGY-1, [place] Intern pager: [phone], text pages welcome

## 2015-10-24 NOTE — Discharge Instructions (Addendum)
Anita [name], Seman were admitted for shortness of breath and chest pain. The shortness of breath may have been due to fluid overload due to your kidney disease, but we could not rule out pneumonia. You received IV antibiotics to treat a possible pneumonia. We are sending you home with the antibiotic levaquin to take 1 pill daily for 5 days.   Cardiology recommended starting a new blood pressure medication called imdur. To make sure your blood pressure doesn't get too low with the start of this new medication, we recommend holding your hydrochlorothiazide unless your kidney doctors decide to restart it. Please follow-up with Dr. Harl Bowie as scheduled to discuss further work-up of your chest pain.   Information on my medicine - ELIQUIS (apixaban)  Why was Eliquis prescribed for you? Eliquis was prescribed for you to reduce the risk of a blood clot forming that can cause a stroke if you have a medical condition called atrial fibrillation (a type of irregular heartbeat).  What do You need to know about Eliquis ? Take your Eliquis TWICE DAILY - one tablet in the morning and one tablet in the evening with or without food. If you have difficulty swallowing the tablet whole please discuss with your pharmacist how to take the medication safely.  Take Eliquis exactly as prescribed by your doctor and DO NOT stop taking Eliquis without talking to the doctor who prescribed the medication.  Stopping may increase your risk of developing a stroke.  Refill your prescription before you run out.  After discharge, you should have regular check-up appointments with your healthcare provider that is prescribing your Eliquis.  In the future your dose may need to be changed if your kidney function or weight changes by a significant amount or as you get older.  What do you do if you miss a dose? If you miss a dose, take it as soon as you remember on the same day and resume taking twice daily.  Do not take more than one  dose of ELIQUIS at the same time to make up a missed dose.  Important Safety Information A possible side effect of Eliquis is bleeding. You should call your healthcare provider right away if you experience any of the following: ? Bleeding from an injury or your nose that does not stop. ? Unusual colored urine (red or dark brown) or unusual colored stools (red or black). ? Unusual bruising for unknown reasons. ? A serious fall or if you hit your head (even if there is no bleeding).  Some medicines may interact with Eliquis and might increase your risk of bleeding or clotting while on Eliquis. To help avoid this, consult your healthcare provider or pharmacist prior to using any new prescription or non-prescription medications, including herbals, vitamins, non-steroidal anti-inflammatory drugs (NSAIDs) and supplements.  This website has more information on Eliquis (apixaban): http://www.eliquis.com/eliquis/home

## 2015-10-24 NOTE — Progress Notes (Signed)
  Echocardiogram 2D Echocardiogram has been performed.  Diamond Nickel 10/24/2015, 1:58 PM

## 2015-10-24 NOTE — Discharge Summary (Signed)
[facility] Discharge Summary  Patient name: Anita [name] Medical record number: [id number] Date of birth: [birth year] Age: 65 y.o. 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 rate 20, height 5' 6.5" (1.689 m), weight 136 lb 0.4 oz (61.7 kg), SpO2 99 %. General: Tired-appearing woman, sitting at edge of bed in NAD Cardiovascular: RRR, 3/6 holosystolic murmur loudest over right upper sternal border. JVD to mid-neck.  Respiratory: Bibasilar crackles appreciated L > R. Decreased breath sounds at bases. Abdomen: +BS, S, NT, ND Extremities: No LE edema. Strong distal pulses.   Brief Hospital Course:  Anita [name] is a 65 y.o. female who presented with chest pain and shortness of breath. Could not rule out HCAP on and she was found to be clinically volume overloaded, likely due to missed HD on day of admission. PMH is significant for aortic stenosis, DM2, Paroxysmal Atrial Fibrillation, ESRD on HD M/W/F, Chronic Diastolic HF (ECHO 123456: EF 75%, G2DD), and Nonobstructive CAD.   In ED,  she was given SL nitro x 2 that resolved her pain. On admission, patient's vitals were stable with no desaturations. Arterial pH 7.341, pCO2 57.1, bicarb 30.9. Normal Lactic Acid. No leukocytosis. BNP stable at 2971. Troponin 0.04. EKG with new T wave inversions mainly in V1-V3. CXR was suggestive of multifocal PNA, so vancomycin and cefepime were administered. She was placed on 4L [place] but was quickly weaned back down to her home 3L. She was admitted for further work-up.  Patient's shortness of breath greatly improved after receiving dialysis night of admission, and she had no further chest pressure. Presenting symptoms most likely due to volume overload in setting of ESRD with long smoking history and emphysema. CT chest was performed to try to better differentiate between PNA vs. volume overload and to rule-out malignancy as patient has had weight loss; it was more consistent with atelectasis rather than pneumonia but showed loculated pleural effusions, so antibiotics were continued. Troponin peaked at 0.05. Cardiology recommended ECHO and myoview or cath, but patient declined performing the latter at present. ECHO showed no worsening of known HFpEF, so patient was discharged home to resume dialysis outpatient. Nephrology recommended increasing dialysis fluid removal goals.   Of note, patient's lisinopril, HCTZ and lasix were not administered during hospitalization, and BPs remained soft. These medications were held at discharge to facilitate increased volume removal goals with currently scheduled dialysis. Imdur was started per Cardiology due to patient's moderate risk of ACS and continued upon discharge. IV antibiotics were switched to PO levaquin at renal dosing.    Issues for Follow Up:  1. Held HCTZ, lasix, and lisinopril  due to addition of imdur and soft pressures in context of wanting to increase volume removed during dialysis. Continue to monitor blood pressures and adjust per Nephrology's  recommendations 2. Consider outpatient cardiology work-up with Dr. Harl Bowie of possible ischemia with myoview or cath  Significant Procedures: Continuation of dialysis, ECHO  Significant Labs and Imaging:   Recent Labs Lab 10/22/15 1221 10/24/15 0558  WBC 6.7 5.0  HGB 8.7* 9.2*  HCT 29.3* 29.7*  PLT 165 175    Recent Labs Lab 10/22/15 1221 10/22/15 1244 10/23/15 0456 10/23/15 1840  NA 140  --   --  134*  K 4.9  --   --  4.2  CL 98*  --   --  95*  CO2 27  --   --  28  GLUCOSE 135*  --   --  82  BUN 53*  --   --  26*  CREATININE 5.83*  --   --  3.65*  CALCIUM 8.1*  --   --  8.1*  MG  --  3.0*  --   --   PHOS  --   --   --  5.3*  ALKPHOS  --   --  56  --   AST  --   --  19  --   ALT  --   --  10*  --   ALBUMIN  --   --  2.4* 2.3*    Results/Tests Pending at Time of Discharge: Blood culture  Discharge Medications:    Medication List    STOP taking these medications        furosemide 80 MG tablet  Commonly known as:  LASIX     hydrALAZINE 100 MG tablet  Commonly known as:  APRESOLINE     lisinopril 40 MG tablet  Commonly known as:  PRINIVIL,ZESTRIL      TAKE these medications        albuterol (2.5 MG/3ML) 0.083% nebulizer solution  Commonly known as:  PROVENTIL  Take 2.5 mg by nebulization every 6 (six) hours as needed for wheezing or shortness of breath.     ALPRAZolam 1 MG tablet  Commonly known as:  XANAX  Take 0.5-1 mg by mouth 4 (four) times daily. Patient takes 1/2 tablet 2-3 times daily and 1 tablet at bedtime     calcium carbonate 500 MG chewable tablet  Commonly known as:  TUMS - dosed in mg elemental calcium  Chew 1 tablet by mouth 2 (two) times daily as needed for heartburn (Takes with snacks. In place of Renvela).     diltiazem 240 MG 24 hr capsule  Commonly known as:  TIAZAC  Take 240 mg by mouth daily.     ELIQUIS 5 MG Tabs tablet  Generic drug:  apixaban  Take 5 mg by mouth 2 (two) times daily.     HYDROcodone-acetaminophen  7.5-325 MG tablet  Commonly known as:  NORCO  Take 1 tablet by mouth every 6 (six) hours as needed for pain.     ipratropium-albuterol 0.5-2.5 (3) MG/3ML Soln  Commonly known as:  DUONEB  Take 3 mLs by nebulization every 4 (four) hours as needed.     isosorbide mononitrate 30 MG 24 hr tablet  Commonly known as:  IMDUR  Take 0.5 tablets (15 mg total) by mouth at bedtime.     levofloxacin 500 MG tablet  Commonly known as:  LEVAQUIN  Take 1 tablet (500 mg total) by mouth daily. Please take 1 tablet every  other day.     meclizine 25 MG tablet  Commonly known as:  ANTIVERT  Take 25 mg by mouth 4 (four) times daily as needed for dizziness.     metoprolol tartrate 25 MG tablet  Commonly known as:  LOPRESSOR  Take 0.5 tablets (12.5 mg total) by mouth 2 (two) times daily.     multivitamin Tabs tablet  Take 1 tablet by mouth at bedtime.     nitroGLYCERIN 0.4 MG SL tablet  Commonly known as:  NITROSTAT  Place 1 tablet (0.4 mg total) under the tongue every 5 (five) minutes as needed for chest pain.     omeprazole 20 MG capsule  Commonly known as:  PRILOSEC  Take 20 mg by mouth daily as needed (acid reflux).     ondansetron 4 MG tablet  Commonly known as:  ZOFRAN  Take 4 mg by mouth every 8 (eight) hours as needed.     polyethylene glycol packet  Commonly known as:  MIRALAX / GLYCOLAX  Take 17 g by mouth daily.     sevelamer carbonate 800 MG tablet  Commonly known as:  RENVELA  Take 2,400 mg by mouth 3 (three) times daily with meals.     Thyroid 97.5 MG Tabs  Take 195 mg by mouth daily.     VITAMIN B 12 PO  Take 1,000 mg by mouth daily.     zolpidem 10 MG tablet  Commonly known as:  AMBIEN  Take 10 mg by mouth at bedtime as needed for sleep.        Discharge Instructions: Please refer to Patient Instructions section of EMR for full details.  Patient was counseled important signs and symptoms that should prompt return to medical care, changes in medications, dietary  instructions, activity restrictions, and follow up appointments.   Follow-Up Appointments: Follow-up Information    Follow up with Carlyle Dolly, MD On 11/02/2015.   Specialty:  Cardiology   Why:  Dr Nelly Laurence PA will see you 1pm   Contact information:   [address] [place] [address] [phone]       Schedule an appointment as soon as possible for a visit with Robert Bellow, MD.   Specialty:  Family Medicine   Why:  for hospital follow-up   Contact information:   [place] [address] [phone]       Rogue Bussing, MD 10/25/2015, 8:06 AM PGY-1, [place]

## 2015-10-24 NOTE — Progress Notes (Signed)
  Pt. discharged to home. Pt after visit summary reviewed and pt capable of re verbalizing medications and follow up appointments. Obtained pt's home thyroid medication from pharmacy and returned to patient. Pt remains stable. No signs and symptoms of distress. Educated to return to ER in the event of SOB, dizziness, chest pain, or fainting.   Mady Gemma, RN

## 2015-10-25 ENCOUNTER — Telehealth: Payer: Self-pay | Admitting: Internal Medicine

## 2015-10-25 MED ORDER — LEVOFLOXACIN 500 MG PO TABS: 500.0000 mg | ORAL_TABLET | Freq: Every day | ORAL | Status: DC

## 2015-10-25 NOTE — Care Management Note (Signed)
Case Management Note        LATE ENTRY  Patient Details  Name: KHYLEIGH MUGRAGE MRN: [id number] Date of Birth: [birth year]  Subjective/Objective:     CM following for progression and d/c planning.               Action/Plan: 10/25/15 Pt d/c last pm, this CM notified this that pt is active with [facility] for [facility].  Given admission condition and condition at time of d/c this pt will require ongoing [facility] services. Resumption orders entered upon discussion with [facility].   Expected Discharge Date:       10/24/2015           Expected Discharge Plan:  [place]  In-House Referral:  NA  Discharge planning Services  CM Consult  Post Acute Care Choice:  Home Health Choice offered to:  Patient  DME Arranged:  N/A DME Agency:  NA  HH Arranged:  RN, PT, OT, Nurse's Aide, Social Work [employer] Agency:  [facility]  Status of Service:  Completed, signed off  Medicare Important Message Given:    Date Medicare IM Given:    Medicare IM give by:    Date Additional Medicare IM Given:    Additional Medicare Important Message give by:     If discussed at [place] of Stay Meetings, dates discussed:    Additional Comments:  Adron Bene, RN 10/25/2015, 10:35 AM

## 2015-10-25 NOTE — Telephone Encounter (Signed)
Called patient to clarify blood pressure medications and inform of antibiotic change. Informed her that levaquin dose should be 500 mg daily every other day for 3 doses and that she should not take furosemide or lisinopril. She expressed understanding and repeated the information back. Message was left with pharmacy about change in levaquin dose. Patient had not yet picked up her medicines.

## 2015-10-27 LAB — CULTURE, BLOOD (ROUTINE X 2)
CULTURE: NO GROWTH
Culture: NO GROWTH

## 2015-11-01 ENCOUNTER — Encounter: Payer: Medicare Other | Admitting: Physician Assistant

## 2015-11-02 ENCOUNTER — Encounter: Payer: Medicare Other | Admitting: Physician Assistant

## 2015-12-30 ENCOUNTER — Emergency Department (HOSPITAL_COMMUNITY): Payer: Medicare Other

## 2015-12-30 ENCOUNTER — Observation Stay (HOSPITAL_COMMUNITY)
Admission: EM | Admit: 2015-12-30 | Discharge: 2016-01-01 | Disposition: A | Discharge status: Home / Self Care | Payer: Medicare Other | Attending: Internal Medicine | Admitting: Internal Medicine

## 2015-12-30 ENCOUNTER — Encounter (HOSPITAL_COMMUNITY): Payer: Self-pay | Admitting: *Deleted

## 2015-12-30 DIAGNOSIS — N186 End stage renal disease: Secondary | ICD-10-CM | POA: Diagnosis not present

## 2015-12-30 DIAGNOSIS — R062 Wheezing: Secondary | ICD-10-CM | POA: Insufficient documentation

## 2015-12-30 DIAGNOSIS — Z79899 Other long term (current) drug therapy: Secondary | ICD-10-CM | POA: Diagnosis not present

## 2015-12-30 DIAGNOSIS — I5032 Chronic diastolic (congestive) heart failure: Secondary | ICD-10-CM | POA: Diagnosis not present

## 2015-12-30 DIAGNOSIS — Z992 Dependence on renal dialysis: Secondary | ICD-10-CM

## 2015-12-30 DIAGNOSIS — I4891 Unspecified atrial fibrillation: Secondary | ICD-10-CM | POA: Insufficient documentation

## 2015-12-30 DIAGNOSIS — E1140 Type 2 diabetes mellitus with diabetic neuropathy, unspecified: Secondary | ICD-10-CM | POA: Diagnosis not present

## 2015-12-30 DIAGNOSIS — E1122 Type 2 diabetes mellitus with diabetic chronic kidney disease: Secondary | ICD-10-CM | POA: Diagnosis not present

## 2015-12-30 DIAGNOSIS — D649 Anemia, unspecified: Secondary | ICD-10-CM | POA: Diagnosis not present

## 2015-12-30 DIAGNOSIS — F17210 Nicotine dependence, cigarettes, uncomplicated: Secondary | ICD-10-CM | POA: Insufficient documentation

## 2015-12-30 DIAGNOSIS — R079 Chest pain, unspecified: Secondary | ICD-10-CM | POA: Diagnosis present

## 2015-12-30 DIAGNOSIS — Z7984 Long term (current) use of oral hypoglycemic drugs: Secondary | ICD-10-CM | POA: Insufficient documentation

## 2015-12-30 DIAGNOSIS — I480 Paroxysmal atrial fibrillation: Secondary | ICD-10-CM | POA: Diagnosis present

## 2015-12-30 DIAGNOSIS — I2510 Atherosclerotic heart disease of native coronary artery without angina pectoris: Secondary | ICD-10-CM | POA: Diagnosis not present

## 2015-12-30 DIAGNOSIS — E8770 Fluid overload, unspecified: Secondary | ICD-10-CM | POA: Diagnosis not present

## 2015-12-30 DIAGNOSIS — I10 Essential (primary) hypertension: Secondary | ICD-10-CM | POA: Diagnosis present

## 2015-12-30 DIAGNOSIS — E785 Hyperlipidemia, unspecified: Secondary | ICD-10-CM | POA: Insufficient documentation

## 2015-12-30 DIAGNOSIS — J449 Chronic obstructive pulmonary disease, unspecified: Secondary | ICD-10-CM | POA: Diagnosis not present

## 2015-12-30 DIAGNOSIS — R0789 Other chest pain: Secondary | ICD-10-CM | POA: Diagnosis present

## 2015-12-30 DIAGNOSIS — I25119 Atherosclerotic heart disease of native coronary artery with unspecified angina pectoris: Secondary | ICD-10-CM | POA: Diagnosis present

## 2015-12-30 LAB — BRAIN NATRIURETIC PEPTIDE: B Natriuretic Peptide: 2726 pg/mL — ABNORMAL HIGH (ref 0.0–100.0)

## 2015-12-30 LAB — BASIC METABOLIC PANEL
Anion gap: 8 (ref 5–15)
BUN: 49 mg/dL — AB (ref 6–20)
CHLORIDE: 100 mmol/L — AB (ref 101–111)
CO2: 29 mmol/L (ref 22–32)
CREATININE: 4.24 mg/dL — AB (ref 0.44–1.00)
Calcium: 8.5 mg/dL — ABNORMAL LOW (ref 8.9–10.3)
GFR calc Af Amer: 12 mL/min — ABNORMAL LOW (ref >60)
GFR calc non Af Amer: 10 mL/min — ABNORMAL LOW (ref >60)
GLUCOSE: 97 mg/dL (ref 65–99)
Potassium: 3.8 mmol/L (ref 3.5–5.1)
SODIUM: 137 mmol/L (ref 135–145)

## 2015-12-30 LAB — CBC
HCT: 31.7 % — ABNORMAL LOW (ref 36.0–46.0)
Hemoglobin: 9.6 g/dL — ABNORMAL LOW (ref 12.0–15.0)
MCH: 28.5 pg (ref 26.0–34.0)
MCHC: 30.3 g/dL (ref 30.0–36.0)
MCV: 94.1 fL (ref 78.0–100.0)
PLATELETS: 123 10*3/uL — AB (ref 150–400)
RBC: 3.37 MIL/uL — ABNORMAL LOW (ref 3.87–5.11)
RDW: 15 % (ref 11.5–15.5)
WBC: 4.2 10*3/uL (ref 4.0–10.5)

## 2015-12-30 LAB — TROPONIN I: Troponin I: 0.04 ng/mL — ABNORMAL HIGH (ref <0.031)

## 2015-12-30 MED ORDER — NITROGLYCERIN 0.4 MG SL SUBL
0.4000 mg | SUBLINGUAL_TABLET | SUBLINGUAL | Status: AC | PRN
  Administered 2015-12-30 (×3): 0.4 mg via SUBLINGUAL
  Filled 2015-12-30: qty 1

## 2015-12-30 MED ORDER — ASPIRIN 81 MG PO CHEW
324.0000 mg | CHEWABLE_TABLET | Freq: Once | ORAL | Status: AC
  Administered 2015-12-30: 324 mg via ORAL
  Filled 2015-12-30: qty 4

## 2015-12-30 MED ORDER — FUROSEMIDE 10 MG/ML IJ SOLN
80.0000 mg | Freq: Once | INTRAMUSCULAR | Status: AC
  Administered 2015-12-30: 80 mg via INTRAVENOUS
  Filled 2015-12-30: qty 8

## 2015-12-30 MED ORDER — NITROGLYCERIN 2 % TD OINT
1.0000 [in_us] | TOPICAL_OINTMENT | Freq: Four times a day (QID) | TRANSDERMAL | Status: DC
  Administered 2015-12-30 – 2016-01-01 (×6): 1 [in_us] via TOPICAL
  Filled 2015-12-30 (×6): qty 1

## 2015-12-30 NOTE — H&P (Addendum)
[facility] Admission History and Physical       Anita [name] [id number] DOB: 07-21-1951 DOA: 12/30/2015  Referring physician:  EDP PCP: Robert Bellow, MD  Specialists:   Chief Complaint: Chest Pain  HPI: Anita [name] is a 65 y.o. female with a history of ESRD on HD (MWF), CAD, Diastolic CHF, COPD, HTN, DM2 who presents to the ED with complaints of Chest pain rated at 10/10 associated with SOB, and Nausea that started this afternoon and was relieved after 1 SL NTG.  The pain was described as Chest Tightness and located in the Left Chest area.   Her initial Cardiac Evaluation in the ED has been Negative and she has been referred for observation and further cardiac workup.      Review of Systems:  Constitutional: No Weight Loss, No Weight Gain, Night Sweats, Fevers, Chills, Dizziness, Light Headedness, Fatigue, or Generalized Weakness HEENT: No Headaches, Difficulty Swallowing,Tooth/Dental Problems,Sore Throat,  No Sneezing, Rhinitis, Ear Ache, Nasal Congestion, or Post Nasal Drip,  Cardio-vascular:  No Chest pain, Orthopnea, PND, Edema in Lower Extremities, Anasarca, Dizziness, Palpitations  Resp: N+Dyspnea, No DOE, No Productive Cough, No Non-Productive Cough, No Hemoptysis, No Wheezing.    GI: No Heartburn, Indigestion, Abdominal Pain, +Nausea, Vomiting, Diarrhea, Constipation, Hematemesis, Hematochezia, Melena, Change in Bowel Habits,  Loss of Appetite  GU: No Dysuria, No Change in Color of Urine, No Urgency or Urinary Frequency, No Flank pain.  Musculoskeletal: No Joint Pain or Swelling, No Decreased Range of Motion, No Back Pain.  Neurologic: No Syncope, No Seizures, Muscle Weakness, Paresthesia, Vision Disturbance or Loss, No Diplopia, No Vertigo, No Difficulty Walking,  Skin: No Rash or Lesions. Psych: No Change in Mood or Affect, No Depression or Anxiety, No Memory loss, No Confusion, or Hallucinations   Past Medical History  Diagnosis Date  . Neuropathy  due to secondary diabetes ([place])   . Diabetic nephropathy ([place])   . Hypertension   . Anemia   . Dyslipidemia   . Laryngeal mass   . COPD (chronic obstructive pulmonary disease) ([place])   . GERD (gastroesophageal reflux disease)   . Sleep apnea     Does not use CPAP- due to weight loss  . Atrial fibrillation ([place])     Diagnosed 11/14 of undetermined age of onset    . Diabetes mellitus with end stage renal disease ([place])   . CAD (coronary artery disease) 07/13/2013    Catheterization 5 years ago by Dr. Elisabeth Cara with reportedly nonobstructive disease Calcification noted on CT scan of chest in November of 2014   . ESRD on hemodialysis ([place])   . Chronic diastolic heart failure ([place])   . Aortic stenosis 12/23/2012  . 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 Atherosclerosis of aorta ([place])   . PONV (postoperative nausea and vomiting)   . Shortness of breath dyspnea     with exertion  . Wears glasses   . CHF (congestive heart failure) ([place])   . Thyroid neoplasm     of uncertain behavior  . Diabetic neuropathy [facility])      Past Surgical History  Procedure Laterality Date  . Cholecystectomy    . Appendectomy    . Shoulder arthroscopy Right     w/ repair of rotator cuff repair  . Elbow tendon surgery Right   . Achilles tendon repair Right   . Cesarean section      X 2   . Hemorrhoid surgery      many years ago  .  Av fistula placement Left 12/29/2012    Procedure: ARTERIOVENOUS (AV) FISTULA CREATION;  Surgeon: Elam Dutch, MD;  Location: [facility] OR;  Service: Vascular;  Laterality: Left;  Creation Left Brachial Cephalic Arteriovenous Fistula  . Cardiac catheterization    . Laryngectomy      mass removed- noncancerous  . Lumbar laminectomy      lower back  . Thyroidectomy N/A 10/19/2014    Procedure: TOTAL THYROIDECTOMY;  Surgeon: Armandina Gemma, MD;  Location: [facility];  Service: General;  Laterality: N/A;      Prior to Admission medications   Medication Sig Start Date End Date Taking?  Authorizing Provider  albuterol (PROVENTIL) (2.5 MG/3ML) 0.083% nebulizer solution Take 2.5 mg by nebulization every 6 (six) hours as needed for wheezing or shortness of breath.    Historical Provider, MD  ALPRAZolam Duanne Moron) 1 MG tablet Take 0.5-1 mg by mouth 4 (four) times daily. Patient takes 1/2 tablet 2-3 times daily and 1 tablet at bedtime    Historical Provider, MD  calcium carbonate (TUMS - DOSED IN MG ELEMENTAL CALCIUM) 500 MG chewable tablet Chew 1 tablet by mouth 2 (two) times daily as needed for heartburn (Takes with snacks. In place of Renvela).    Historical Provider, MD  Cyanocobalamin (VITAMIN B 12 PO) Take 1,000 mg by mouth daily.    Historical Provider, MD  diltiazem (TIAZAC) 240 MG 24 hr capsule Take 240 mg by mouth daily.    Historical Provider, MD  ELIQUIS 5 MG TABS tablet Take 5 mg by mouth 2 (two) times daily.  01/01/14   Historical Provider, MD  furosemide (LASIX) 80 MG tablet Take 1.5 tablets (120 mg total) by mouth daily. Takes Tues,wed,thurs 09/03/15   Kathie Dike, MD  hydrALAZINE (APRESOLINE) 100 MG tablet Take 100 mg by mouth 3 (three) times daily. 11/29/15   Historical Provider, MD  HYDROcodone-acetaminophen (NORCO) 7.5-325 MG per tablet Take 1 tablet by mouth every 6 (six) hours as needed for pain. 02/15/13   Regina J Roczniak, PA-C  ipratropium-albuterol (DUONEB) 0.5-2.5 (3) MG/3ML SOLN Take 3 mLs by nebulization every 4 (four) hours as needed. 09/03/15   Kathie Dike, MD  isosorbide mononitrate (IMDUR) 30 MG 24 hr tablet Take 0.5 tablets (15 mg total) by mouth at bedtime. 10/24/15   Hillary Corinda Gubler, MD  levofloxacin (LEVAQUIN) 500 MG tablet Take 1 tablet (500 mg total) by mouth daily. Please take 1 tablet every other day. Patient not taking: Reported on 12/30/2015 10/25/15   Rogue Bussing, MD  lisinopril (PRINIVIL,ZESTRIL) 40 MG tablet Take 40 mg by mouth 2 (two) times daily.    Historical Provider, MD  meclizine (ANTIVERT) 25 MG tablet Take 25 mg by mouth 4  (four) times daily as needed for dizziness.    Historical Provider, MD  metoprolol tartrate (LOPRESSOR) 25 MG tablet Take 0.5 tablets (12.5 mg total) by mouth 2 (two) times daily. 09/03/15   Kathie Dike, MD  multivitamin (RENA-VIT) TABS tablet Take 1 tablet by mouth at bedtime. 07/18/13   Blain Pais, MD  nitroGLYCERIN (NITROSTAT) 0.4 MG SL tablet Place 1 tablet (0.4 mg total) under the tongue every 5 (five) minutes as needed for chest pain. 10/24/15   Smiley Houseman, MD  omeprazole (PRILOSEC) 20 MG capsule Take 20 mg by mouth daily as needed (acid reflux).     Historical Provider, MD  ondansetron (ZOFRAN) 4 MG tablet Take 4 mg by mouth every 8 (eight) hours as needed.  12/16/14   Historical Provider,  MD  polyethylene glycol (MIRALAX / GLYCOLAX) packet Take 17 g by mouth daily.    Historical Provider, MD  sevelamer carbonate (RENVELA) 800 MG tablet Take 2,400 mg by mouth 3 (three) times daily with meals.     Historical Provider, MD  Thyroid 97.5 MG TABS Take 195 mg by mouth daily.    Historical Provider, MD  zolpidem (AMBIEN) 10 MG tablet Take 10 mg by mouth at bedtime as needed for sleep.    Historical Provider, MD     Allergies  Allergen Reactions  . Doxycycline Nausea And Vomiting  . Lipitor [Atorvastatin] Nausea And Vomiting    Social History:  reports that she has been smoking Cigarettes and E-cigarettes.  She started smoking about 46 years ago. She has a 20 pack-year smoking history. She has never used smokeless tobacco. She reports that she does not drink alcohol or use illicit drugs.    Family History  Problem Relation Age of Onset  . COPD Mother   . Thyroid disease Mother   . Multiple sclerosis Father   . Heart disease Father   . Heart attack Father   . Depression Brother     Suicide  . Alcohol abuse Brother   . Heart disease Brother   . Asthma Brother   . Thyroid disease Daughter   . Diabetes Maternal Aunt        Physical Exam:  GEN:   Pleasant  65 y.o.  female examined and in no acute distress; cooperative with exam Filed Vitals:   12/30/15 2220 12/30/15 2230 12/30/15 2245 12/30/15 2300  BP: 142/64 138/67 144/63 145/65  Pulse: 75 82 73 72  Temp:      TempSrc:      Resp: 24 20 13 17   Height:      Weight:      SpO2: 90% 91% 94% 94%   Blood pressure 145/65, pulse 72, temperature 97.8 F (36.6 C), temperature source Oral, resp. rate 17, height 5' 6.5" (1.689 m), weight 60.5 kg (133 lb 6.1 oz), SpO2 94 %. PSYCH: She is alert and oriented x4; does not appear anxious does not appear depressed; affect is normal HEENT: Normocephalic and Atraumatic, Mucous membranes pink; PERRLA; EOM intact; Fundi:  Benign;  No scleral icterus, Nares: Patent, Oropharynx: Clear, Fair Dentition,    Neck:  FROM, No Cervical Lymphadenopathy nor Thyromegaly or Carotid Bruit; No JVD; Breasts:: Not examined CHEST WALL: No tenderness CHEST: Normal respiration, clear to auscultation bilaterally HEART: Regular rate and rhythm; no murmurs rubs or gallops BACK: No kyphosis or scoliosis; No CVA tenderness ABDOMEN: Positive Bowel Sounds, Scaphoid, Obese, Soft Non-Tender, No Rebound or Guarding; No Masses, No Organomegaly, No Pannus; No Intertriginous candida. Rectal Exam: Not done EXTREMITIES: No Cyanosis, Clubbing, or Edema; No Ulcerations. Genitalia: not examined PULSES: 2+ and symmetric SKIN: Normal hydration no rash or ulceration CNS:  Alert and Oriented x 4, No Focal Deficits Vascular: pulses palpable throughout    Labs on Admission:  Basic Metabolic Panel:  Recent Labs Lab 12/30/15 2102  NA 137  K 3.8  CL 100*  CO2 29  GLUCOSE 97  BUN 49*  CREATININE 4.24*  CALCIUM 8.5*   Liver Function Tests: No results for input(s): AST, ALT, ALKPHOS, BILITOT, PROT, ALBUMIN in the last 168 hours. No results for input(s): LIPASE, AMYLASE in the last 168 hours. No results for input(s): AMMONIA in the last 168 hours. CBC:  Recent Labs Lab 12/30/15 2102  WBC 4.2   HGB 9.6*  HCT 31.7*  MCV 94.1  PLT 123*   Cardiac Enzymes:  Recent Labs Lab 12/30/15 2102  TROPONINI 0.04*    BNP (last 3 results)  Recent Labs  10/22/15 1244 12/30/15 2102  BNP 2971.2* 2726.0*    ProBNP (last 3 results) No results for input(s): PROBNP in the last 8760 hours.  CBG: No results for input(s): GLUCAP in the last 168 hours.  Radiological Exams on Admission: Dg Chest Port 1 View  12/30/2015  CLINICAL DATA:  65 year old female with chest pain EXAM: PORTABLE CHEST 1 VIEW COMPARISON:  Chest CT dated 10/22/2015 FINDINGS: Single portable view of the chest demonstrate small bilateral pleural effusions with hazy bilateral mid to lower lung lung field interstitial and airspace densities. There is apparent focal area of pleural thickening or along the lower lateral pleural surface which may represent loculated fluid or focal pleural thickening. There is no pneumothorax. Moderate to severe cardiomegaly. No acute osseous pathology. IMPRESSION: Cardiomegaly with small bilateral pleural effusions. Bilateral mid to lower lung field interstitial densities represent congestive changes or edema. Pneumonia is not excluded. Clinical correlation is recommended. Electronically Signed   By: Anner Crete M.D.   On: 12/30/2015 22:45     EKG: Independently reviewed. Normal Sinus Rhythm Rate = 72, +LVH changes    Assessment/Plan:       65 y.o. female with  Principal Problem:    Chest pain   Cardiac Monitoring   Cycle Troponins   Nitropaste, O2 , ASA    Continue Atorvastatin, Lisinopril and Metoprolol Rx   Cardiology consulted to see in AM     Active Problems:      CAD- non obstructive CAD 2009   Continue Atorvastatin, Lisinopril, and Metoprolol Rx   Imdur on hold while on Nitropaste    Continue ASA      Chronic diastolic heart failure (HCC)   Treated with Dialysis      PAF (paroxysmal atrial fibrillation) (HCC)   On Metoprolol and Eliquis Rx      Essential  hypertension   Continue Metoprolol, Hydralazine, Amlodipine, and Lisinopril Rx   Monitor BPs    ESRD on hemodialysis ([place])   Consulted Nephrology to see in AM   Dialysis Schedule is MWF    DVT Prophylaxis    On Eliquis Rx    Code Status:     FULL CODE  Family Communication:   Daughters at Bedside  Disposition Plan:   Observation Status        Time spent:   31 Minutes      Theressa Millard [facility] Pager [phone]   If 7AM -7PM Please Contact the Day Rounding Team MD for [facility]  If 7PM-7AM, Please Contact Night-Floor Coverage  [email or web address] Password [email or web address] 12/30/2015, 11:26 PM     ADDENDUM:   Patient was seen and examined on 12/30/2015

## 2015-12-30 NOTE — ED Notes (Signed)
Pt c/o pressure in mid of her chest that started today around 2pm pt states she just doesn't feel well

## 2015-12-30 NOTE — ED Notes (Signed)
After receiving nitro, pt is pain free. Pt states she has just been feeling weak since yesterday. Pt talking with eyes closed.

## 2015-12-30 NOTE — ED Provider Notes (Signed)
CSN: [id number]     Arrival date & time 12/30/15  2036 History  By signing my name below, I, Anita [name], attest that this documentation has been prepared under the direction and in the presence of Theressa Millard, MD.   Electronically Signed: Nicole [name], ED Scribe. 12/30/2015. 11:21 PM    Chief Complaint  Patient presents with  . Chest Pain    Patient is a 65 y.o. female presenting with chest pain. The history is provided by the patient. No language interpreter was used.  Chest Pain Pain radiates to:  Does not radiate Pain radiates to the back: no   Pain severity:  Mild Onset quality:  Gradual Duration:  7 hours Timing:  Constant Progression:  Unable to specify Chronicity:  New Relieved by:  Nothing Worsened by:  Nothing tried Ineffective treatments:  Oxygen Associated symptoms: cough and shortness of breath   Associated symptoms: no abdominal pain, no back pain, no dizziness, no fever, no headache, no nausea, no numbness, no palpitations, not vomiting and no weakness    HPI Comments: Anita [name] is a 65 y.o. female with extensive PMHx as noted below who presents to the Emergency Department complaining of gradual onset, shortness of breath, onset this afternoon around 2 PM. Pt reports associated midsternal chest pain, productive cough, and mild wheezing. No other associated symptoms noted. No worsening or alleviating factors noted. Pt denies leg swelling, fever, chills, abdominal pain, abdominal distension, or any other pertinent symptoms. Pt uses albuterol and oxygen at home.   Past Medical History  Diagnosis Date  . Neuropathy due to secondary diabetes ([place])   . Diabetic nephropathy ([place])   . Hypertension   . Anemia   . Dyslipidemia   . Laryngeal mass   . COPD (chronic obstructive pulmonary disease) ([place])   . GERD (gastroesophageal reflux disease)   . Sleep apnea     Does not use CPAP- due to weight loss  . Atrial fibrillation ([place])     Diagnosed 11/14  of undetermined age of onset    . 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 CAD (coronary artery disease) 07/13/2013    Catheterization 5 years ago by Dr. Elisabeth Cara with reportedly nonobstructive disease Calcification noted on CT scan of chest in November of 2014   . ESRD on hemodialysis ([place])   . Chronic diastolic heart failure ([place])   . Aortic stenosis 12/23/2012  . 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 Atherosclerosis of aorta ([place])   . PONV (postoperative nausea and vomiting)   . Shortness of breath dyspnea     with exertion  . Wears glasses   . CHF (congestive heart failure) ([place])   . Thyroid neoplasm     of uncertain behavior  . Diabetic neuropathy [facility])    Past Surgical History  Procedure Laterality Date  . Cholecystectomy    . Appendectomy    . Shoulder arthroscopy Right     w/ repair of rotator cuff repair  . Elbow tendon surgery Right   . Achilles tendon repair Right   . Cesarean section      X 2   . Hemorrhoid surgery      many years ago  . Av fistula placement Left 12/29/2012    Procedure: ARTERIOVENOUS (AV) FISTULA CREATION;  Surgeon: Elam Dutch, MD;  [facility];  Service: Vascular;  Laterality: Left;  Creation Left Brachial Cephalic Arteriovenous Fistula  . Cardiac catheterization    .  Laryngectomy      mass removed- noncancerous  . Lumbar laminectomy      lower back  . Thyroidectomy N/A 10/19/2014    Procedure: TOTAL THYROIDECTOMY;  Surgeon: Armandina Gemma, MD;  Location: [facility] OR;  Service: General;  Laterality: N/A;   Family History  Problem Relation Age of Onset  . COPD Mother   . Thyroid disease Mother   . Multiple sclerosis Father   . Heart disease Father   . Heart attack Father   . Depression Brother     Suicide  . Alcohol abuse Brother   . Heart disease Brother   . Asthma Brother   . Thyroid disease Daughter   . Diabetes Maternal Aunt    Social History  Substance Use Topics  . Smoking status: Current Some Day Smoker --  0.50 packs/day for 40 years    Types: Cigarettes, E-cigarettes    Start date: 08/05/1969  . Smokeless tobacco: Never Used  . Alcohol Use: No   OB History    No data available     Review of Systems  Constitutional: Negative for fever and chills.  Respiratory: Positive for cough, chest tightness, shortness of breath and wheezing.   Cardiovascular: Positive for chest pain. Negative for palpitations and leg swelling.  Gastrointestinal: Negative for nausea, vomiting, abdominal pain, constipation and abdominal distention.  Musculoskeletal: Negative for myalgias, back pain, neck pain and neck stiffness.  Skin: Negative for rash and wound.  Neurological: Negative for dizziness, weakness, light-headedness, numbness and headaches.  All other systems reviewed and are negative.    Allergies  Doxycycline and Lipitor  Home Medications   Prior to Admission medications   Medication Sig Start Date End Date Taking? Authorizing Provider  albuterol (PROVENTIL) (2.5 MG/3ML) 0.083% nebulizer solution Take 2.5 mg by nebulization every 6 (six) hours as needed for wheezing or shortness of breath.    Historical Provider, MD  ALPRAZolam Duanne Moron) 1 MG tablet Take 0.5-1 mg by mouth 4 (four) times daily. Patient takes 1/2 tablet 2-3 times daily and 1 tablet at bedtime    Historical Provider, MD  calcium carbonate (TUMS - DOSED IN MG ELEMENTAL CALCIUM) 500 MG chewable tablet Chew 1 tablet by mouth 2 (two) times daily as needed for heartburn (Takes with snacks. In place of Renvela).    Historical Provider, MD  Cyanocobalamin (VITAMIN B 12 PO) Take 1,000 mg by mouth daily.    Historical Provider, MD  diltiazem (TIAZAC) 240 MG 24 hr capsule Take 240 mg by mouth daily.    Historical Provider, MD  ELIQUIS 5 MG TABS tablet Take 5 mg by mouth 2 (two) times daily.  01/01/14   Historical Provider, MD  furosemide (LASIX) 80 MG tablet Take 1.5 tablets (120 mg total) by mouth daily. Takes Tues,wed,thurs 09/03/15   Kathie Dike, MD  hydrALAZINE (APRESOLINE) 100 MG tablet Take 100 mg by mouth 3 (three) times daily. 11/29/15   Historical Provider, MD  HYDROcodone-acetaminophen (NORCO) 7.5-325 MG per tablet Take 1 tablet by mouth every 6 (six) hours as needed for pain. 02/15/13   Regina J Roczniak, PA-C  ipratropium-albuterol (DUONEB) 0.5-2.5 (3) MG/3ML SOLN Take 3 mLs by nebulization every 4 (four) hours as needed. 09/03/15   Kathie Dike, MD  isosorbide mononitrate (IMDUR) 30 MG 24 hr tablet Take 0.5 tablets (15 mg total) by mouth at bedtime. 10/24/15   Hillary Corinda Gubler, MD  levofloxacin (LEVAQUIN) 500 MG tablet Take 1 tablet (500 mg total) by mouth daily. Please take 1 tablet  every other day. Patient not taking: Reported on 12/30/2015 10/25/15   Rogue Bussing, MD  lisinopril (PRINIVIL,ZESTRIL) 40 MG tablet Take 40 mg by mouth 2 (two) times daily.    Historical Provider, MD  meclizine (ANTIVERT) 25 MG tablet Take 25 mg by mouth 4 (four) times daily as needed for dizziness.    Historical Provider, MD  metoprolol tartrate (LOPRESSOR) 25 MG tablet Take 0.5 tablets (12.5 mg total) by mouth 2 (two) times daily. 09/03/15   Kathie Dike, MD  multivitamin (RENA-VIT) TABS tablet Take 1 tablet by mouth at bedtime. 07/18/13   Blain Pais, MD  nitroGLYCERIN (NITROSTAT) 0.4 MG SL tablet Place 1 tablet (0.4 mg total) under the tongue every 5 (five) minutes as needed for chest pain. 10/24/15   Smiley Houseman, MD  omeprazole (PRILOSEC) 20 MG capsule Take 20 mg by mouth daily as needed (acid reflux).     Historical Provider, MD  ondansetron (ZOFRAN) 4 MG tablet Take 4 mg by mouth every 8 (eight) hours as needed.  12/16/14   Historical Provider, MD  polyethylene glycol (MIRALAX / GLYCOLAX) packet Take 17 g by mouth daily.    Historical Provider, MD  sevelamer carbonate (RENVELA) 800 MG tablet Take 2,400 mg by mouth 3 (three) times daily with meals.     Historical Provider, MD  Thyroid 97.5 MG TABS Take 195 mg by  mouth daily.    Historical Provider, MD  zolpidem (AMBIEN) 10 MG tablet Take 10 mg by mouth at bedtime as needed for sleep.    Historical Provider, MD   BP 176/70 mmHg  Pulse 74  Temp(Src) 97.8 F (36.6 C) (Oral)  Resp 22  Ht 5' 6.5" (1.689 m)  Wt 133 lb 6.1 oz (60.5 kg)  BMI 21.21 kg/m2  SpO2 100% Physical Exam  Constitutional: She is oriented to person, place, and time. She appears well-developed and well-nourished. No distress.  HENT:  Head: Normocephalic and atraumatic.  Mouth/Throat: Oropharynx is clear and moist. No oropharyngeal exudate.  Eyes: EOM are normal. Pupils are equal, round, and reactive to light.  Neck: Normal range of motion. Neck supple.  Cardiovascular: Normal rate and regular rhythm.   Murmur (harsh systolic murmur) heard. Pulmonary/Chest: Effort normal. No respiratory distress. She has no wheezes. She has rales. She exhibits no tenderness.  Bilateral crackles up to the mid lung fields  Abdominal: Soft. Bowel sounds are normal. She exhibits no distension and no mass. There is no tenderness. There is no rebound.  Musculoskeletal: Normal range of motion. She exhibits no edema or tenderness.  Left AV fistula with palpable thrill. No erythema, warmth or tenderness. Mild bilateral lower extremity swelling without asymmetry or tenderness.  Neurological: She is alert and oriented to person, place, and time.  Moves all extremities without deficit. Sensation is fully intact.  Skin: Skin is warm and dry. No rash noted. No erythema.  Psychiatric: She has a normal mood and affect. Her behavior is normal.  Nursing note and vitals reviewed.   ED Course  Procedures (including critical care time) DIAGNOSTIC STUDIES: Oxygen Saturation is 100% on RA, normal by my interpretation.    COORDINATION OF CARE: 9:06 PM Discussed treatment plan which includes CXR, troponin I, BNP, CBC, and EKG with pt at bedside and pt agreed to plan.   Labs Review Labs Reviewed  BASIC  METABOLIC PANEL - Abnormal; Notable for the following:    Chloride 100 (*)    BUN 49 (*)    Creatinine, Ser 4.24 (*)  Calcium 8.5 (*)    GFR calc non Af Amer 10 (*)    GFR calc Af Amer 12 (*)    All other components within normal limits  CBC - Abnormal; Notable for the following:    RBC 3.37 (*)    Hemoglobin 9.6 (*)    HCT 31.7 (*)    Platelets 123 (*)    All other components within normal limits  TROPONIN I - Abnormal; Notable for the following:    Troponin I 0.04 (*)    All other components within normal limits  BRAIN NATRIURETIC PEPTIDE - Abnormal; Notable for the following:    B Natriuretic Peptide 2726.0 (*)    All other components within normal limits    Imaging Review Dg Chest Port 1 View  12/30/2015  CLINICAL DATA:  65 year old female with chest pain EXAM: PORTABLE CHEST 1 VIEW COMPARISON:  Chest CT dated 10/22/2015 FINDINGS: Single portable view of the chest demonstrate small bilateral pleural effusions with hazy bilateral mid to lower lung lung field interstitial and airspace densities. There is apparent focal area of pleural thickening or along the lower lateral pleural surface which may represent loculated fluid or focal pleural thickening. There is no pneumothorax. Moderate to severe cardiomegaly. No acute osseous pathology. IMPRESSION: Cardiomegaly with small bilateral pleural effusions. Bilateral mid to lower lung field interstitial densities represent congestive changes or edema. Pneumonia is not excluded. Clinical correlation is recommended. Electronically Signed   By: Anner Crete M.D.   On: 12/30/2015 22:45   I have personally reviewed and evaluated these images and lab results as part of my medical decision-making.   EKG Interpretation   Date/Time:  Sunday Dec 30 2015 20:47:50 EDT Ventricular Rate:  72 PR Interval:  169 QRS Duration: 102 QT Interval:  435 QTC Calculation: 476 R Axis:   60 Text Interpretation:  Sinus rhythm Left ventricular hypertrophy  Confirmed  by Lita Mains  MD, Rilley Poulter ([id number]) on 12/30/2015 9:31:43 PM      MDM   Final diagnoses:  Chest pain, unspecified chest pain type  Hypervolemia, unspecified hypervolemia type   I personally performed the services described in this documentation, which was scribed in my presence. The recorded information has been reviewed and is accurate.   Chest pain resolved after nitroglycerin. Evidence of fluid overload with elevated BNP and chest x-ray. Low suspicion for pneumonia. Discussed with Dr. Arnoldo Morale and will admit to telemetry bed. Will need hemodialysis.     Julianne Rice, MD 12/30/15 [phone]

## 2015-12-31 ENCOUNTER — Encounter (HOSPITAL_COMMUNITY): Payer: Self-pay

## 2015-12-31 DIAGNOSIS — Z992 Dependence on renal dialysis: Secondary | ICD-10-CM

## 2015-12-31 DIAGNOSIS — I6523 Occlusion and stenosis of bilateral carotid arteries: Secondary | ICD-10-CM

## 2015-12-31 DIAGNOSIS — I5033 Acute on chronic diastolic (congestive) heart failure: Secondary | ICD-10-CM | POA: Diagnosis not present

## 2015-12-31 DIAGNOSIS — I25119 Atherosclerotic heart disease of native coronary artery with unspecified angina pectoris: Secondary | ICD-10-CM

## 2015-12-31 DIAGNOSIS — R079 Chest pain, unspecified: Secondary | ICD-10-CM | POA: Diagnosis not present

## 2015-12-31 DIAGNOSIS — E8770 Fluid overload, unspecified: Secondary | ICD-10-CM | POA: Diagnosis not present

## 2015-12-31 DIAGNOSIS — I350 Nonrheumatic aortic (valve) stenosis: Secondary | ICD-10-CM

## 2015-12-31 DIAGNOSIS — R0789 Other chest pain: Secondary | ICD-10-CM | POA: Diagnosis not present

## 2015-12-31 DIAGNOSIS — I480 Paroxysmal atrial fibrillation: Secondary | ICD-10-CM

## 2015-12-31 DIAGNOSIS — N186 End stage renal disease: Secondary | ICD-10-CM

## 2015-12-31 DIAGNOSIS — I10 Essential (primary) hypertension: Secondary | ICD-10-CM

## 2015-12-31 DIAGNOSIS — E1122 Type 2 diabetes mellitus with diabetic chronic kidney disease: Secondary | ICD-10-CM | POA: Diagnosis not present

## 2015-12-31 LAB — TROPONIN I
TROPONIN I: 0.04 ng/mL — AB (ref <0.031)
Troponin I: 0.04 ng/mL — ABNORMAL HIGH (ref <0.031)
Troponin I: 0.04 ng/mL — ABNORMAL HIGH (ref <0.031)

## 2015-12-31 LAB — CBC
HCT: 31.1 % — ABNORMAL LOW (ref 36.0–46.0)
Hemoglobin: 9.6 g/dL — ABNORMAL LOW (ref 12.0–15.0)
MCH: 28.8 pg (ref 26.0–34.0)
MCHC: 30.9 g/dL (ref 30.0–36.0)
MCV: 93.4 fL (ref 78.0–100.0)
PLATELETS: 129 10*3/uL — AB (ref 150–400)
RBC: 3.33 MIL/uL — ABNORMAL LOW (ref 3.87–5.11)
RDW: 15.2 % (ref 11.5–15.5)
WBC: 4.6 10*3/uL (ref 4.0–10.5)

## 2015-12-31 LAB — BASIC METABOLIC PANEL
Anion gap: 9 (ref 5–15)
BUN: 53 mg/dL — AB (ref 6–20)
CHLORIDE: 98 mmol/L — AB (ref 101–111)
CO2: 29 mmol/L (ref 22–32)
CREATININE: 4.39 mg/dL — AB (ref 0.44–1.00)
Calcium: 8.2 mg/dL — ABNORMAL LOW (ref 8.9–10.3)
GFR calc Af Amer: 11 mL/min — ABNORMAL LOW (ref >60)
GFR calc non Af Amer: 10 mL/min — ABNORMAL LOW (ref >60)
GLUCOSE: 106 mg/dL — AB (ref 65–99)
POTASSIUM: 4.2 mmol/L (ref 3.5–5.1)
Sodium: 136 mmol/L (ref 135–145)

## 2015-12-31 MED ORDER — EPOETIN ALFA 3000 UNIT/ML IJ SOLN
3000.0000 [IU] | INTRAMUSCULAR | Status: DC
  Administered 2015-12-31: 3000 [IU] via INTRAVENOUS

## 2015-12-31 MED ORDER — ALPRAZOLAM 1 MG PO TABS: 1.0000 mg | ORAL_TABLET | Freq: Every day | ORAL | Status: DC

## 2015-12-31 MED ORDER — LIDOCAINE-PRILOCAINE 2.5-2.5 % EX CREA: 1.0000 "application " | TOPICAL_CREAM | CUTANEOUS | Status: DC | PRN

## 2015-12-31 MED ORDER — RENA-VITE PO TABS
1.0000 | ORAL_TABLET | Freq: Every day | ORAL | Status: DC
  Administered 2015-12-31: 1 via ORAL
  Filled 2015-12-31: qty 1

## 2015-12-31 MED ORDER — ACETAMINOPHEN 325 MG PO TABS: 650.0000 mg | ORAL_TABLET | Freq: Four times a day (QID) | ORAL | Status: DC | PRN

## 2015-12-31 MED ORDER — NITROGLYCERIN 0.4 MG SL SUBL: 0.4000 mg | SUBLINGUAL_TABLET | SUBLINGUAL | Status: DC | PRN

## 2015-12-31 MED ORDER — HYDROCODONE-ACETAMINOPHEN 7.5-325 MG PO TABS
1.0000 | ORAL_TABLET | Freq: Four times a day (QID) | ORAL | Status: DC | PRN
  Administered 2015-12-31 – 2016-01-01 (×3): 1 via ORAL
  Filled 2015-12-31 (×4): qty 1

## 2015-12-31 MED ORDER — OXYCODONE HCL 5 MG PO TABS
10.0000 mg | ORAL_TABLET | ORAL | Status: DC | PRN
  Administered 2015-12-31: 10 mg via ORAL
  Filled 2015-12-31: qty 2

## 2015-12-31 MED ORDER — ACETAMINOPHEN 650 MG RE SUPP: 650.0000 mg | Freq: Four times a day (QID) | RECTAL | Status: DC | PRN

## 2015-12-31 MED ORDER — SODIUM CHLORIDE 0.9% FLUSH
3.0000 mL | Freq: Two times a day (BID) | INTRAVENOUS | Status: DC
  Administered 2015-12-31: 3 mL via INTRAVENOUS

## 2015-12-31 MED ORDER — SODIUM CHLORIDE 0.9 % IV SOLN: 100.0000 mL | INTRAVENOUS | Status: DC | PRN

## 2015-12-31 MED ORDER — SEVELAMER CARBONATE 800 MG PO TABS
2400.0000 mg | ORAL_TABLET | Freq: Three times a day (TID) | ORAL | Status: DC
  Filled 2015-12-31: qty 3

## 2015-12-31 MED ORDER — THYROID 97.5 MG PO TABS
195.0000 mg | ORAL_TABLET | Freq: Every day | ORAL | Status: DC
  Administered 2016-01-01: 195 mg via ORAL
  Filled 2015-12-31: qty 2

## 2015-12-31 MED ORDER — PANTOPRAZOLE SODIUM 40 MG PO TBEC
40.0000 mg | DELAYED_RELEASE_TABLET | Freq: Every day | ORAL | Status: DC
  Filled 2015-12-31: qty 1

## 2015-12-31 MED ORDER — EPOETIN ALFA 3000 UNIT/ML IJ SOLN: 3000.0000 [IU] | INTRAMUSCULAR | Status: DC

## 2015-12-31 MED ORDER — APIXABAN 5 MG PO TABS
5.0000 mg | ORAL_TABLET | Freq: Two times a day (BID) | ORAL | Status: DC
  Administered 2015-12-31 (×2): 5 mg via ORAL
  Filled 2015-12-31 (×2): qty 1

## 2015-12-31 MED ORDER — HYDRALAZINE HCL 25 MG PO TABS
100.0000 mg | ORAL_TABLET | Freq: Three times a day (TID) | ORAL | Status: DC
  Administered 2015-12-31 (×2): 100 mg via ORAL
  Filled 2015-12-31 (×3): qty 4
  Filled 2015-12-31: qty 2
  Filled 2015-12-31 (×5): qty 4

## 2015-12-31 MED ORDER — SODIUM CHLORIDE 0.9 % IV SOLN: 250.0000 mL | INTRAVENOUS | Status: DC | PRN

## 2015-12-31 MED ORDER — LIDOCAINE HCL (PF) 1 % IJ SOLN: 5.0000 mL | INTRAMUSCULAR | Status: DC | PRN

## 2015-12-31 MED ORDER — DILTIAZEM HCL ER BEADS 240 MG PO CP24
240.0000 mg | ORAL_CAPSULE | Freq: Every day | ORAL | Status: DC
  Administered 2015-12-31: 240 mg via ORAL
  Filled 2015-12-31 (×4): qty 1

## 2015-12-31 MED ORDER — ONDANSETRON HCL 4 MG/2ML IJ SOLN: 4.0000 mg | Freq: Four times a day (QID) | INTRAMUSCULAR | Status: DC | PRN

## 2015-12-31 MED ORDER — LISINOPRIL 10 MG PO TABS
40.0000 mg | ORAL_TABLET | Freq: Two times a day (BID) | ORAL | Status: DC
  Administered 2015-12-31: 40 mg via ORAL
  Filled 2015-12-31 (×2): qty 4

## 2015-12-31 MED ORDER — METOPROLOL TARTRATE 25 MG PO TABS
12.5000 mg | ORAL_TABLET | Freq: Two times a day (BID) | ORAL | Status: DC
  Administered 2015-12-31 (×3): 12.5 mg via ORAL
  Filled 2015-12-31 (×3): qty 1

## 2015-12-31 MED ORDER — ONDANSETRON HCL 4 MG PO TABS: 4.0000 mg | ORAL_TABLET | Freq: Four times a day (QID) | ORAL | Status: DC | PRN

## 2015-12-31 MED ORDER — POLYETHYLENE GLYCOL 3350 17 G PO PACK
17.0000 g | PACK | Freq: Every day | ORAL | Status: DC
  Administered 2015-12-31: 17 g via ORAL
  Filled 2015-12-31: qty 1

## 2015-12-31 MED ORDER — ZOLPIDEM TARTRATE 5 MG PO TABS
5.0000 mg | ORAL_TABLET | Freq: Every evening | ORAL | Status: DC | PRN
  Administered 2015-12-31: 5 mg via ORAL
  Filled 2015-12-31: qty 1

## 2015-12-31 MED ORDER — EPOETIN ALFA 4000 UNIT/ML IJ SOLN
INTRAMUSCULAR | Status: AC
  Administered 2015-12-31: 3000 [IU]
  Filled 2015-12-31: qty 1

## 2015-12-31 MED ORDER — SODIUM CHLORIDE 0.9% FLUSH: 3.0000 mL | INTRAVENOUS | Status: DC | PRN

## 2015-12-31 MED ORDER — HYDROMORPHONE HCL 1 MG/ML IJ SOLN: 0.5000 mg | INTRAMUSCULAR | Status: DC | PRN

## 2015-12-31 MED ORDER — PENTAFLUOROPROP-TETRAFLUOROETH EX AERO: 1.0000 "application " | INHALATION_SPRAY | CUTANEOUS | Status: DC | PRN

## 2015-12-31 MED ORDER — ALPRAZOLAM 0.5 MG PO TABS
0.5000 mg | ORAL_TABLET | Freq: Three times a day (TID) | ORAL | Status: DC | PRN
  Administered 2015-12-31 – 2016-01-01 (×2): 0.5 mg via ORAL
  Filled 2015-12-31 (×2): qty 1

## 2015-12-31 NOTE — Care Management Obs Status (Signed)
[place] NOTIFICATION   Patient Details  Name: ANYIAH OLLIS MRN: [id number] Date of Birth: 1950/12/19   Medicare Observation Status Notification Given:  Yes    Alvie Heidelberg, RN 12/31/2015, 10:30 AM

## 2015-12-31 NOTE — Progress Notes (Signed)
PROGRESS NOTE                                                                                                                                                                                                             Patient Demographics:    Anita [name], is a 65 y.o. female, DOB - September 03, 1950, [id number]  Admit date - 12/30/2015   Admitting Physician Theressa Millard, MD  Outpatient Primary MD for the patient is Robert Bellow, MD  LOS -   Outpatient Specialists: Dr Harl Bowie, Dr Florene Glen  Chief Complaint  Patient presents with  . Chest Pain       Brief Narrative     Anita [name] is a 65 y.o. female with a history of ESRD on HD (MWF), CAD, Diastolic CHF, COPD, HTN, DM2 who presents to the ED with complaints of Chest pain rated at 10/10 associated with SOB, and Nausea that started this afternoon and was relieved after 1 SL NTG. The pain was described as Chest Tightness and located in the Left Chest area. Her initial Cardiac Evaluation in the ED has been Negative and she has been referred for observation and further cardiac workup.    Subjective:    Anita [name] has, No headache, No chest pain, No abdominal pain - No Nausea, No new weakness tingling or numbness, No Cough - SOB.     Assessment  & Plan :     1.Chest pain in a patient with history of CAD and still smoking. EKG nonacute, troponin trend and non-ACS pattern and flat, currently chest pain-free, cardiology consulted for further workup. EKG and chest x-ray were nonacute. Continue beta blocker, L a question for secondary prevention and monitor.  2. ESRD. On M, W, F schedule, renal consulted and will be dialyzed [name].  3. COPD. On home oxygen. Stable no wheezing. Supportive care only.  4. Paroxysmal atrial fibrillation. [place] vasc 2 score of greater than 4. Currently on Cardizem, beta blocker and an liquids. Continue. Cardiology called.  Telemetry monitor.  5. Essential hypertension. Stable continue present medications which include beta blocker, Cardizem, ACE inhibitor.  6. GERD. On PPI.  7. Smoking. Counseled to quit  8. Moderate to severe AS. Stable supportive care. Defer to cardiology any further workup.  9. Chronic diastolic CHF. EF 60% on recent echogram. Compensated.  Code Status : Full  Family Communication  : Husband  Disposition Plan  : Home 1-2 days  Barriers For Discharge : Chest Pain workup, HD  Consults  :  Cards, Renal  Procedures  :    DVT Prophylaxis  :  Eliquis  Lab Results  Component Value Date   PLT 129* 12/31/2015    Antibiotics  :     Anti-infectives    None        Objective:   Filed Vitals:   12/31/15 0100 12/31/15 0153 12/31/15 0154 12/31/15 0553  BP: 182/74 182/75  170/63  Pulse: 84 65  75  Temp:  97.6 F (36.4 C)  98.2 F (36.8 C)  TempSrc:  Oral  Oral  Resp: 21   22  Height:  5\' 6"  (1.676 m)    Weight:  60.056 kg (132 lb 6.4 oz)    SpO2: 86% 96% 96% 91%    Wt Readings from Last 3 Encounters:  12/31/15 60.056 kg (132 lb 6.4 oz)  10/24/15 61.7 kg (136 lb 0.4 oz)  09/27/15 65.772 kg (145 lb)    No intake or output data in the 24 hours ending 12/31/15 0933   Physical Exam  Awake Alert, Oriented X 3, No new F.N deficits, Normal affect [place].AT,PERRAL Supple Neck,No JVD, No cervical lymphadenopathy appriciated.  Symmetrical Chest wall movement, Good air movement bilaterally, CTAB RRR,No Gallops,Rubs, +ve loud aortic systolic Murmur, No Parasternal Heave +ve B.Sounds, Abd Soft, No tenderness, No organomegaly appriciated, No rebound - guarding or rigidity. No Cyanosis, Clubbing or edema, No new Rash or bruise       Data Review:    CBC  Recent Labs Lab 12/30/15 2102 12/31/15 0758  WBC 4.2 4.6  HGB 9.6* 9.6*  HCT 31.7* 31.1*  PLT 123* 129*  MCV 94.1 93.4  MCH 28.5 28.8  MCHC 30.3 30.9  RDW 15.0 15.2    Chemistries   Recent Labs Lab  12/30/15 2102 12/31/15 0758  NA 137 136  K 3.8 4.2  CL 100* 98*  CO2 29 29  GLUCOSE 97 106*  BUN 49* 53*  CREATININE 4.24* 4.39*  CALCIUM 8.5* 8.2*   ------------------------------------------------------------------------------------------------------------------ No results for input(s): CHOL, HDL, LDLCALC, TRIG, CHOLHDL, LDLDIRECT in the last 72 hours.  No results found for: HGBA1C ------------------------------------------------------------------------------------------------------------------ No results for input(s): TSH, T4TOTAL, T3FREE, THYROIDAB in the last 72 hours.  Invalid input(s): FREET3 ------------------------------------------------------------------------------------------------------------------ No results for input(s): VITAMINB12, FOLATE, FERRITIN, TIBC, IRON, RETICCTPCT in the last 72 hours.  Coagulation profile No results for input(s): INR, PROTIME in the last 168 hours.  No results for input(s): DDIMER in the last 72 hours.  Cardiac Enzymes  Recent Labs Lab 12/30/15 2102 12/31/15 0247 12/31/15 0757  TROPONINI 0.04* 0.04* 0.04*   ------------------------------------------------------------------------------------------------------------------    Component Value Date/Time   BNP 2726.0* 12/30/2015 2102    Inpatient Medications  Scheduled Meds: . apixaban  5 mg Oral BID  . diltiazem  240 mg Oral Daily  . epoetin (EPOGEN/PROCRIT) injection  3,000 Units Subcutaneous Q M,W,F-HD  . hydrALAZINE  100 mg Oral TID  . lisinopril  40 mg Oral BID  . metoprolol tartrate  12.5 mg Oral BID  . multivitamin  1 tablet Oral QHS  . nitroGLYCERIN  1 inch Topical Q6H  . pantoprazole  40 mg Oral Daily  . polyethylene glycol  17 g Oral Daily  . sevelamer carbonate  2,400 mg Oral TID WC  . Thyroid  195 mg Oral Daily   Continuous  Infusions:  PRN Meds:.acetaminophen **OR** [DISCONTINUED] acetaminophen, ALPRAZolam, HYDROcodone-acetaminophen, nitroGLYCERIN,  [DISCONTINUED] ondansetron **OR** ondansetron (ZOFRAN) IV, zolpidem  Micro Results No results found for this or any previous visit (from the past 240 hour(s)).  Radiology Reports Dg Chest Port 1 View  12/30/2015  CLINICAL DATA:  65 year old female with chest pain EXAM: PORTABLE CHEST 1 VIEW COMPARISON:  Chest CT dated 10/22/2015 FINDINGS: Single portable view of the chest demonstrate small bilateral pleural effusions with hazy bilateral mid to lower lung lung field interstitial and airspace densities. There is apparent focal area of pleural thickening or along the lower lateral pleural surface which may represent loculated fluid or focal pleural thickening. There is no pneumothorax. Moderate to severe cardiomegaly. No acute osseous pathology. IMPRESSION: Cardiomegaly with small bilateral pleural effusions. Bilateral mid to lower lung field interstitial densities represent congestive changes or edema. Pneumonia is not excluded. Clinical correlation is recommended. Electronically Signed   By: Anner Crete M.D.   On: 12/30/2015 22:45    Time Spent in minutes  30   SINGH,PRASHANT K M.D on 12/31/2015 at 9:33 AM  Between 7am to 7pm - Pager - [phone]  After 7pm go to [email or web address] - password [facility]  [facility] -  Office  [phone]

## 2015-12-31 NOTE — ED Notes (Signed)
Assisted patient with changing brief and took wet pants off. Sent pants home with family.

## 2015-12-31 NOTE — Consult Note (Signed)
Reason for Consult: End-stage renal disease Referring Physician: Dr.  Raliegh Ip.Anita [name] is an 65 y.o. female.  HPI: She is a patient with history of COPD on oxygen, coronary artery disease, diabetes presently came with complaints of chest pain, some difficulty breathing associated with nausea. She described the chest pain like tachycardia with which seems to be better. She has some cough but no difficulty breathing. Presently she denies any nausea or vomiting. Consult is called for dialysis.  Past Medical History  Diagnosis Date  . Neuropathy due to secondary diabetes ([place])   . Diabetic nephropathy ([place])   . Hypertension   . Anemia   . Dyslipidemia   . Laryngeal mass   . COPD (chronic obstructive pulmonary disease) ([place])   . GERD (gastroesophageal reflux disease)   . Sleep apnea     Does not use CPAP- due to weight loss  . Atrial fibrillation ([place])     Diagnosed 11/14 of undetermined age of onset    . 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 CAD (coronary artery disease) 07/13/2013    Catheterization 5 years ago by Dr. Elisabeth Cara with reportedly nonobstructive disease Calcification noted on CT scan of chest in November of 2014   . ESRD on hemodialysis ([place])   . Chronic diastolic heart failure ([place])   . Aortic stenosis 12/23/2012  . 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 Atherosclerosis of aorta ([place])   . PONV (postoperative nausea and vomiting)   . Shortness of breath dyspnea     with exertion  . Wears glasses   . CHF (congestive heart failure) ([place])   . Thyroid neoplasm     of uncertain behavior  . Diabetic neuropathy [facility])     Past Surgical History  Procedure Laterality Date  . Cholecystectomy    . Appendectomy    . Shoulder arthroscopy Right     w/ repair of rotator cuff repair  . Elbow tendon surgery Right   . Achilles tendon repair Right   . Cesarean section      X 2   . Hemorrhoid surgery      many years ago  . Av fistula placement Left  12/29/2012    Procedure: ARTERIOVENOUS (AV) FISTULA CREATION;  Surgeon: Elam Dutch, MD;  Location: [facility] OR;  Service: Vascular;  Laterality: Left;  Creation Left Brachial Cephalic Arteriovenous Fistula  . Cardiac catheterization    . Laryngectomy      mass removed- noncancerous  . Lumbar laminectomy      lower back  . Thyroidectomy N/A 10/19/2014    Procedure: TOTAL THYROIDECTOMY;  Surgeon: Armandina Gemma, MD;  Location: [facility] OR;  Service: General;  Laterality: N/A;    Family History  Problem Relation Age of Onset  . COPD Mother   . Thyroid disease Mother   . Multiple sclerosis Father   . Heart disease Father   . Heart attack Father   . Depression Brother     Suicide  . Alcohol abuse Brother   . Heart disease Brother   . Asthma Brother   . Thyroid disease Daughter   . Diabetes Maternal Aunt     Social History:  reports that she has been smoking Cigarettes and E-cigarettes.  She started smoking about 46 years ago. She has a 20 pack-year smoking history. She has never used smokeless tobacco. She reports that she does not drink alcohol or use illicit drugs.  Allergies:  Allergies  Allergen Reactions  .  Doxycycline Nausea And Vomiting  . Lipitor [Atorvastatin] Nausea And Vomiting    Medications: I have reviewed the patient'[name] current medications.  Results for orders placed or performed during the hospital encounter of 12/30/15 (from the past 48 hour([name]))  Basic metabolic panel     Status: Abnormal   Collection Time: 12/30/15  9:02 PM  Result Value Ref Range   Sodium 137 135 - 145 mmol/L   Potassium 3.8 3.5 - 5.1 mmol/L   Chloride 100 (L) 101 - 111 mmol/L   CO2 29 22 - 32 mmol/L   Glucose, Bld 97 65 - 99 mg/dL   BUN 49 (H) 6 - 20 mg/dL   Creatinine, Ser 4.24 (H) 0.44 - 1.00 mg/dL   Calcium 8.5 (L) 8.9 - 10.3 mg/dL   GFR calc non Af Amer 10 (L) >60 mL/min   GFR calc Af Amer 12 (L) >60 mL/min    Comment: (NOTE) The eGFR has been calculated using the CKD EPI equation. This  calculation has not been validated in all clinical situations. eGFR'[name] persistently <60 mL/min signify possible Chronic Kidney Disease.    Anion gap 8 5 - 15  CBC     Status: Abnormal   Collection Time: 12/30/15  9:02 PM  Result Value Ref Range   WBC 4.2 4.0 - 10.5 K/uL   RBC 3.37 (L) 3.87 - 5.11 MIL/uL   Hemoglobin 9.6 (L) 12.0 - 15.0 g/dL   HCT 31.7 (L) 36.0 - 46.0 %   MCV 94.1 78.0 - 100.0 fL   MCH 28.5 26.0 - 34.0 pg   MCHC 30.3 30.0 - 36.0 g/dL   RDW 15.0 11.5 - 15.5 %   Platelets 123 (L) 150 - 400 K/uL  Troponin I     Status: Abnormal   Collection Time: 12/30/15  9:02 PM  Result Value Ref Range   Troponin I 0.04 (H) <0.031 ng/mL    Comment:        PERSISTENTLY INCREASED TROPONIN VALUES IN THE RANGE OF 0.04-0.49 ng/mL CAN BE SEEN IN:       -UNSTABLE ANGINA       -CONGESTIVE HEART FAILURE       -MYOCARDITIS       -CHEST TRAUMA       -ARRYHTHMIAS       -LATE PRESENTING MYOCARDIAL INFARCTION       -COPD   CLINICAL FOLLOW-UP RECOMMENDED.   Brain natriuretic peptide     Status: Abnormal   Collection Time: 12/30/15  9:02 PM  Result Value Ref Range   B Natriuretic Peptide 2726.0 (H) 0.0 - 100.0 pg/mL  Troponin I     Status: Abnormal   Collection Time: 12/31/15  2:47 AM  Result Value Ref Range   Troponin I 0.04 (H) <0.031 ng/mL    Comment:        PERSISTENTLY INCREASED TROPONIN VALUES IN THE RANGE OF 0.04-0.49 ng/mL CAN BE SEEN IN:       -UNSTABLE ANGINA       -CONGESTIVE HEART FAILURE       -MYOCARDITIS       -CHEST TRAUMA       -ARRYHTHMIAS       -LATE PRESENTING MYOCARDIAL INFARCTION       -COPD   CLINICAL FOLLOW-UP RECOMMENDED.   CBC     Status: Abnormal   Collection Time: 12/31/15  7:58 AM  Result Value Ref Range   WBC 4.6 4.0 - 10.5 K/uL   RBC 3.33 (L) 3.87 - 5.11  MIL/uL   Hemoglobin 9.6 (L) 12.0 - 15.0 g/dL   HCT 31.1 (L) 36.0 - 46.0 %   MCV 93.4 78.0 - 100.0 fL   MCH 28.8 26.0 - 34.0 pg   MCHC 30.9 30.0 - 36.0 g/dL   RDW 15.2 11.5 - 15.5 %    Platelets 129 (L) 150 - 400 K/uL    Dg Chest Port 1 View  12/30/2015  CLINICAL DATA:  65 year old female with chest pain EXAM: PORTABLE CHEST 1 VIEW COMPARISON:  Chest CT dated 10/22/2015 FINDINGS: Single portable view of the chest demonstrate small bilateral pleural effusions with hazy bilateral mid to lower lung lung field interstitial and airspace densities. There is apparent focal area of pleural thickening or along the lower lateral pleural surface which may represent loculated fluid or focal pleural thickening. There is no pneumothorax. Moderate to severe cardiomegaly. No acute osseous pathology. IMPRESSION: Cardiomegaly with small bilateral pleural effusions. Bilateral mid to lower lung field interstitial densities represent congestive changes or edema. Pneumonia is not excluded. Clinical correlation is recommended. Electronically Signed   By: Anner Crete M.D.   On: 12/30/2015 22:45    Review of Systems  Constitutional: Positive for malaise/fatigue. Negative for fever and chills.  Cardiovascular: Positive for chest pain. Negative for orthopnea and leg swelling.  Gastrointestinal: Positive for nausea. Negative for vomiting and abdominal pain.   Blood pressure 170/63, pulse 75, temperature 98.2 F (36.8 C), temperature source Oral, resp. rate 22, height '5\' 6"'$  (1.676 m), weight 60.056 kg (132 lb 6.4 oz), SpO2 91 %. Physical Exam  Constitutional: No distress.  Eyes: No scleral icterus.  Neck: Neck supple.  Cardiovascular: Normal rate and regular rhythm.   Murmur heard. Respiratory: No respiratory distress. She has no wheezes.  GI: She exhibits no distension. There is no tenderness.  Musculoskeletal: She exhibits no edema.  She has left upper arm fistula    Assessment/Plan: Problem #1 chest pain: Patient with history of nonobstructive coronary artery disease. Chest pain Seems to be getting better. Presently she has a mild elevation of troponin. Problem #2 end-stage renal disease:  She is status post hemodialysis on Friday. Presently she denies any nausea or vomiting. However patient was having some nausea when she came. Problem #3 history of COPD: She is on oxygen. Problem #4 history of paroxysmal atrial fibrillation Problem #5 history of hypertension: Her blood pressure is slightly high this morning Problem #6 history of diabetes Problem #7 history of aortic stenosis Plan: We'll dialyze patient for 3 hours 2] we'll remove one and half liters if her blood pressure tolerates. 3] we'll use Epogen 40,000 units IV after each dialysis. 4] will check renal panel in the morning.  Anita [name] 12/31/2015, 8:40 AM

## 2015-12-31 NOTE — Procedures (Signed)
   HEMODIALYSIS TREATMENT NOTE:  3 hour heparin-free dialysis completed via left upper arm AVF (15g/buttonhole/ante-retrograde). Goal met: 1.5 liters removed without interruption in ultrafiltration. All blood was returned and hemostasis was achieved within 15 minutes. Report called to Weyman Pedro, RN.  Rockwell Alexandria, RN, CDN

## 2015-12-31 NOTE — Consult Note (Signed)
CARDIOLOGY CONSULT NOTE  Patient ID: Anita [name] MRN: [id number] DOB/AGE: 67-29-1952 67 y.o.  Admit date: 12/30/2015 Primary Physician: Robert Bellow, MD Referring Physician: Arnoldo Morale MD Primary cardiologist: Harl Bowie MD Consulting cardiologist: Bronson Ing MD  Reason for Consultation: chest pain  HPI: 65 yr old female with h/o paroxysmal atrial fibrillation, chronic diastolic heart failure managed with dialysis, non-obstructive CAD, ESRD, bilateral carotid artery stenosis, and HTN who presented with chest pain.  Hgb 9.6, troponins 0.04 x 2, platelets 123.  Chest xray showed small b/l pleural effusions with some CHF v pneumonia.  Last nuclear MPI study 12/15/07 positive for ischemia.  ECG which I personally interpreted showed sinus rhythm with LVH.  Echo 0000000 normal LV systolic function, EF 123456, grade I diastolic dysfunction, elevated filling pressures, moderate to severe aortic stenosis with moderate regurgitation, mod LAE/RAE, mod RVE, mildly reduced RV function, moderate tricuspid regurgitation, severe pulmonary hypertension (64 mmHg).  She said symptoms began Saturday afternoon while at rest and progressively built up. Describes retrosternal chest pressure and pressure in left precordium accompanied by shortness of breath. Says "I feel like there is fluid in my lungs".  Due for hemodialysis today.    Allergies  Allergen Reactions  . Doxycycline Nausea And Vomiting  . Lipitor [Atorvastatin] Nausea And Vomiting    Current Facility-Administered Medications  Medication Dose Route Frequency Provider Last Rate Last Dose  . 0.9 %  sodium chloride infusion  250 mL Intravenous PRN Theressa Millard, MD      . acetaminophen (TYLENOL) tablet 650 mg  650 mg Oral Q6H PRN Theressa Millard, MD       Or  . acetaminophen (TYLENOL) suppository 650 mg  650 mg Rectal Q6H PRN Theressa Millard, MD      . ALPRAZolam Duanne Moron) tablet 0.5 mg  0.5 mg Oral TID PRN Theressa Millard, MD      . ALPRAZolam Duanne Moron) tablet 1 mg  1 mg Oral QHS Theressa Millard, MD      . apixaban (ELIQUIS) tablet 5 mg  5 mg Oral BID Theressa Millard, MD   5 mg at 12/31/15 0200  . diltiazem (TIAZAC) 24 hr capsule 240 mg  240 mg Oral Daily Thurnell Lose, MD      . epoetin alfa (EPOGEN,PROCRIT) injection 3,000 Units  3,000 Units Subcutaneous Q M,W,F-HD Fran Lowes, MD      . hydrALAZINE (APRESOLINE) tablet 100 mg  100 mg Oral TID Thurnell Lose, MD      . HYDROmorphone (DILAUDID) injection 0.5-1 mg  0.5-1 mg Intravenous Q3H PRN Theressa Millard, MD      . lisinopril (PRINIVIL,ZESTRIL) tablet 40 mg  40 mg Oral BID Theressa Millard, MD   40 mg at 12/31/15 0200  . metoprolol tartrate (LOPRESSOR) tablet 12.5 mg  12.5 mg Oral BID Theressa Millard, MD   12.5 mg at 12/31/15 0224  . multivitamin (RENA-VIT) tablet 1 tablet  1 tablet Oral QHS Theressa Millard, MD   1 tablet at 12/31/15 0200  . nitroGLYCERIN (NITROGLYN) 2 % ointment 1 inch  1 inch Topical Q6H Theressa Millard, MD   1 inch at 12/31/15 0500  . nitroGLYCERIN (NITROSTAT) SL tablet 0.4 mg  0.4 mg Sublingual Q5 min PRN Theressa Millard, MD      . ondansetron (ZOFRAN) tablet 4 mg  4 mg Oral Q6H PRN Theressa Millard, MD       Or  . ondansetron (  ZOFRAN) injection 4 mg  4 mg Intravenous Q6H PRN Theressa Millard, MD      . oxyCODONE (Oxy IR/ROXICODONE) immediate release tablet 10 mg  10 mg Oral Q4H PRN Harvette Evonnie Dawes, MD      . pantoprazole (PROTONIX) EC tablet 40 mg  40 mg Oral Daily Harvette Evonnie Dawes, MD      . polyethylene glycol (MIRALAX / GLYCOLAX) packet 17 g  17 g Oral Daily Harvette Evonnie Dawes, MD      . sevelamer carbonate (RENVELA) tablet 2,400 mg  2,400 mg Oral TID WC Harvette Evonnie Dawes, MD      . sodium chloride flush (NS) 0.9 % injection 3 mL  3 mL Intravenous Q12H Theressa Millard, MD   3 mL at 12/31/15 0200  . sodium chloride flush (NS) 0.9 % injection 3 mL  3 mL Intravenous PRN Theressa Millard, MD      . Thyroid TABS 195 mg  195 mg Oral Daily Harvette Evonnie Dawes, MD      . zolpidem (AMBIEN) tablet 5 mg  5 mg Oral QHS PRN Theressa Millard, MD        Past Medical History  Diagnosis Date  . Neuropathy due to secondary diabetes ([place])   . Diabetic nephropathy ([place])   . Hypertension   . Anemia   . Dyslipidemia   . Laryngeal mass   . COPD (chronic obstructive pulmonary disease) ([place])   . GERD (gastroesophageal reflux disease)   . Sleep apnea     Does not use CPAP- due to weight loss  . Atrial fibrillation ([place])     Diagnosed 11/14 of undetermined age of onset    . 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 CAD (coronary artery disease) 07/13/2013    Catheterization 5 years ago by Dr. Elisabeth Cara with reportedly nonobstructive disease Calcification noted on CT scan of chest in November of 2014   . ESRD on hemodialysis ([place])   . Chronic diastolic heart failure ([place])   . Aortic stenosis 12/23/2012  . 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 Atherosclerosis of aorta ([place])   . PONV (postoperative nausea and vomiting)   . Shortness of breath dyspnea     with exertion  . Wears glasses   . CHF (congestive heart failure) ([place])   . Thyroid neoplasm     of uncertain behavior  . Diabetic neuropathy [facility])     Past Surgical History  Procedure Laterality Date  . Cholecystectomy    . Appendectomy    . Shoulder arthroscopy Right     w/ repair of rotator cuff repair  . Elbow tendon surgery Right   . Achilles tendon repair Right   . Cesarean section      X 2   . Hemorrhoid surgery      many years ago  . Av fistula placement Left 12/29/2012    Procedure: ARTERIOVENOUS (AV) FISTULA CREATION;  Surgeon: Elam Dutch, MD;  Location: [facility] OR;  Service: Vascular;  Laterality: Left;  Creation Left Brachial Cephalic Arteriovenous Fistula  . Cardiac catheterization    . Laryngectomy      mass removed- noncancerous  . Lumbar laminectomy      lower back  .  Thyroidectomy N/A 10/19/2014    Procedure: TOTAL THYROIDECTOMY;  Surgeon: Armandina Gemma, MD;  Location: [place];  Service: General;  Laterality: N/A;    Social History   Social History  . Marital Status:  Married    Spouse Name: N/A  . Number of Children: N/A  . Years of Education: N/A   Occupational History  . 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 Hair dresser    Social History Main Topics  . Smoking status: Current Some Day Smoker -- 0.50 packs/day for 40 years    Types: Cigarettes, E-cigarettes    Start date: 08/05/1969  . Smokeless tobacco: Never Used  . Alcohol Use: No  . Drug Use: No  . Sexual Activity: Not Currently    Birth Control/ Protection: Post-menopausal   Other Topics Concern  . Not on file   Social History Narrative   65 yo woman who lives at home with her husband of 60 years. She has a 40 pack year history of smoking, denies etoh or illicit drug use. 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      No family history of premature CAD in 1st degree relatives.  Prior to Admission medications   Medication Sig Start Date End Date Taking? Authorizing Provider  albuterol (PROVENTIL) (2.5 MG/3ML) 0.083% nebulizer solution Take 2.5 mg by nebulization every 6 (six) hours as needed for wheezing or shortness of breath.    Historical Provider, MD  ALPRAZolam Duanne Moron) 1 MG tablet Take 0.5-1 mg by mouth 4 (four) times daily. Patient takes 1/2 tablet 2-3 times daily and 1 tablet at bedtime    Historical Provider, MD  calcium carbonate (TUMS - DOSED IN MG ELEMENTAL CALCIUM) 500 MG chewable tablet Chew 1 tablet by mouth 2 (two) times daily as needed for heartburn (Takes with snacks. In place of Renvela).    Historical Provider, MD  Cyanocobalamin (VITAMIN B 12 PO) Take 1,000 mg by mouth daily.    Historical Provider, MD  diltiazem (TIAZAC) 240 MG 24 hr capsule Take 240  mg by mouth daily.    Historical Provider, MD  ELIQUIS 5 MG TABS tablet Take 5 mg by mouth 2 (two) times daily.  01/01/14   Historical Provider, MD  furosemide (LASIX) 80 MG tablet Take 1.5 tablets (120 mg total) by mouth daily. Takes Tues,wed,thurs 09/03/15   Kathie Dike, MD  hydrALAZINE (APRESOLINE) 100 MG tablet Take 100 mg by mouth 3 (three) times daily. 11/29/15   Historical Provider, MD  HYDROcodone-acetaminophen (NORCO) 7.5-325 MG per tablet Take 1 tablet by mouth every 6 (six) hours as needed for pain. 02/15/13   Regina J Roczniak, PA-C  ipratropium-albuterol (DUONEB) 0.5-2.5 (3) MG/3ML SOLN Take 3 mLs by nebulization every 4 (four) hours as needed. 09/03/15   Kathie Dike, MD  isosorbide mononitrate (IMDUR) 30 MG 24 hr tablet Take 0.5 tablets (15 mg total) by mouth at bedtime. 10/24/15   Hillary Corinda Gubler, MD  levofloxacin (LEVAQUIN) 500 MG tablet Take 1 tablet (500 mg total) by mouth daily. Please take 1 tablet every other day. Patient not taking: Reported on 12/30/2015 10/25/15   Rogue Bussing, MD  lisinopril (PRINIVIL,ZESTRIL) 40 MG tablet Take 40 mg by mouth 2 (two) times daily.    Historical Provider, MD  meclizine (ANTIVERT) 25 MG tablet Take 25 mg by mouth 4 (four) times daily as needed for dizziness.    Historical Provider, MD  metoprolol tartrate (LOPRESSOR) 25 MG tablet Take 0.5 tablets (12.5 mg total) by mouth 2 (two) times daily. 09/03/15   Kathie Dike, MD  multivitamin (RENA-VIT)  TABS tablet Take 1 tablet by mouth at bedtime. 07/18/13   Blain Pais, MD  nitroGLYCERIN (NITROSTAT) 0.4 MG SL tablet Place 1 tablet (0.4 mg total) under the tongue every 5 (five) minutes as needed for chest pain. 10/24/15   Smiley Houseman, MD  omeprazole (PRILOSEC) 20 MG capsule Take 20 mg by mouth daily as needed (acid reflux).     Historical Provider, MD  ondansetron (ZOFRAN) 4 MG tablet Take 4 mg by mouth every 8 (eight) hours as needed.  12/16/14   Historical Provider, MD    polyethylene glycol (MIRALAX / GLYCOLAX) packet Take 17 g by mouth daily.    Historical Provider, MD  sevelamer carbonate (RENVELA) 800 MG tablet Take 2,400 mg by mouth 3 (three) times daily with meals.     Historical Provider, MD  Thyroid 97.5 MG TABS Take 195 mg by mouth daily.    Historical Provider, MD  zolpidem (AMBIEN) 10 MG tablet Take 10 mg by mouth at bedtime as needed for sleep.    Historical Provider, MD     Review of systems complete and found to be negative unless listed above in HPI     Physical exam Blood pressure 170/63, pulse 75, temperature 98.2 F (36.8 C), temperature source Oral, resp. rate 22, height 5\' 6"  (1.676 m), weight 132 lb 6.4 oz (60.056 kg), SpO2 91 %. General: NAD, thin build Neck: +JVD  Lungs: Diminished at bases with bibasilar rales. CV: Nondisplaced PMI. Regular rate and rhythm with premature contractions, normal S1/S2, no S3/S4, 3/6 harsh ejection systolic murmur heard throughout precordium.  No peripheral edema.  Abdomen: Soft, nontender, no distention.  Neurologic: Alert and oriented x 3.  Psych: Flat affect. HEENT: Normal.   ECG: Most recent ECG reviewed.  Labs:   Lab Results  Component Value Date   WBC 4.6 12/31/2015   HGB 9.6* 12/31/2015   HCT 31.1* 12/31/2015   MCV 93.4 12/31/2015   PLT 129* 12/31/2015    Recent Labs Lab 12/30/15 2102  NA 137  K 3.8  CL 100*  CO2 29  BUN 49*  CREATININE 4.24*  CALCIUM 8.5*  GLUCOSE 97   Lab Results  Component Value Date   CKTOTAL 48 07/13/2013   TROPONINI 0.04* 12/31/2015   No results found for: CHOL No results found for: HDL No results found for: LDLCALC No results found for: TRIG No results found for: CHOLHDL No results found for: LDLDIRECT       Studies: Dg Chest Port 1 View  12/30/2015  CLINICAL DATA:  65 year old female with chest pain EXAM: PORTABLE CHEST 1 VIEW COMPARISON:  Chest CT dated 10/22/2015 FINDINGS: Single portable view of the chest demonstrate small bilateral  pleural effusions with hazy bilateral mid to lower lung lung field interstitial and airspace densities. There is apparent focal area of pleural thickening or along the lower lateral pleural surface which may represent loculated fluid or focal pleural thickening. There is no pneumothorax. Moderate to severe cardiomegaly. No acute osseous pathology. IMPRESSION: Cardiomegaly with small bilateral pleural effusions. Bilateral mid to lower lung field interstitial densities represent congestive changes or edema. Pneumonia is not excluded. Clinical correlation is recommended. Electronically Signed   By: Anner Crete M.D.   On: 12/30/2015 22:45    ASSESSMENT AND PLAN:  1. Chest pressure in context of nonobstructive CAD: Likely related to acute on chronic diastolic heart failure. Due for dialysis today. Troponin elevation insignificant with no acute ECG findings indicative of ischemia. Would consider outpatient nuclear  stress test.  No medication adjustments at this time. Will monitor for symptom after resolution after dialysis.  2. Essential HTN: Elevated today. Due for dialysis. On hydralazine and lisinopril.  3. Acute on chronic diastolic heart failure: Fluid management per dialysis.  4. Paroxysmal atrial fibrillation: Currently in sinus rhythm. Anticoagulated with Eliquis.  5. Bilateral carotid artery stenosis: No neuro symptoms.  6. ESRD: Due for dialysis later this morning.  7. Aortic stenosis: Moderate to severe. Continue to monitor with surveillance echocardiography.   Signed: Kate Sable, M.D., F.A.C.C.  12/31/2015, 9:16 AM

## 2015-12-31 NOTE — ED Notes (Signed)
Assisted patient to the bedside commode. Instructed her to push call button when she is done.

## 2016-01-01 DIAGNOSIS — E8770 Fluid overload, unspecified: Secondary | ICD-10-CM | POA: Diagnosis not present

## 2016-01-01 DIAGNOSIS — R079 Chest pain, unspecified: Secondary | ICD-10-CM | POA: Diagnosis not present

## 2016-01-01 LAB — RENAL FUNCTION PANEL
Albumin: 3 g/dL — ABNORMAL LOW (ref 3.5–5.0)
Anion gap: 7 (ref 5–15)
BUN: 29 mg/dL — AB (ref 6–20)
CHLORIDE: 99 mmol/L — AB (ref 101–111)
CO2: 30 mmol/L (ref 22–32)
CREATININE: 2.88 mg/dL — AB (ref 0.44–1.00)
Calcium: 8.1 mg/dL — ABNORMAL LOW (ref 8.9–10.3)
GFR calc Af Amer: 19 mL/min — ABNORMAL LOW (ref >60)
GFR calc non Af Amer: 16 mL/min — ABNORMAL LOW (ref >60)
Glucose, Bld: 93 mg/dL (ref 65–99)
Phosphorus: 4 mg/dL (ref 2.5–4.6)
Potassium: 3.8 mmol/L (ref 3.5–5.1)
Sodium: 136 mmol/L (ref 135–145)

## 2016-01-01 NOTE — Progress Notes (Signed)
Subjective: Interval History: has no complaint of chest pain or difficulty breathing. Patient claims she is feeling much better. Presently she offers no complaints.  Objective: Vital signs in last 24 hours: Temp:  [97.6 F (36.4 C)-97.9 F (36.6 C)] 97.6 F (36.4 C) (05/16 0601) Pulse Rate:  [66-81] 70 (05/16 0601) Resp:  [20] 20 (05/16 0601) BP: (143-182)/(60-80) 143/60 mmHg (05/16 0601) SpO2:  [93 %-100 %] 93 % (05/16 0601) Weight:  [60 kg (132 lb 4.4 oz)] 60 kg (132 lb 4.4 oz) (05/15 1305) Weight change: -0.5 kg (-1 lb 1.6 oz)  Intake/Output from previous day: 05/15 0701 - 05/16 0700 In: 720 [P.O.:720] Out: 2000 [Urine:300] Intake/Output this shift:    General appearance: alert, cooperative and no distress Resp: diminished breath sounds bilaterally Cardio: regular rate and rhythm and systolic murmur: holosystolic 5/6, blowing throughout the precordium GI: soft, non-tender; bowel sounds normal; no masses,  no organomegaly Extremities: extremities normal, atraumatic, no cyanosis or edema  Lab Results:  Recent Labs  12/30/15 2102 12/31/15 0758  WBC 4.2 4.6  HGB 9.6* 9.6*  HCT 31.7* 31.1*  PLT 123* 129*   BMET:  Recent Labs  12/31/15 0758 01/01/16 0501  NA 136 136  K 4.2 3.8  CL 98* 99*  CO2 29 30  GLUCOSE 106* 93  BUN 53* 29*  CREATININE 4.39* 2.88*  CALCIUM 8.2* 8.1*   No results for input(s): PTH in the last 72 hours. Iron Studies: No results for input(s): IRON, TIBC, TRANSFERRIN, FERRITIN in the last 72 hours.  Studies/Results: Dg Chest Port 1 View  12/30/2015  CLINICAL DATA:  65 year old female with chest pain EXAM: PORTABLE CHEST 1 VIEW COMPARISON:  Chest CT dated 10/22/2015 FINDINGS: Single portable view of the chest demonstrate small bilateral pleural effusions with hazy bilateral mid to lower lung lung field interstitial and airspace densities. There is apparent focal area of pleural thickening or along the lower lateral pleural surface which may  represent loculated fluid or focal pleural thickening. There is no pneumothorax. Moderate to severe cardiomegaly. No acute osseous pathology. IMPRESSION: Cardiomegaly with small bilateral pleural effusions. Bilateral mid to lower lung field interstitial densities represent congestive changes or edema. Pneumonia is not excluded. Clinical correlation is recommended. Electronically Signed   By: Anner Crete M.D.   On: 12/30/2015 22:45    I have reviewed the patient's current medications.  Assessment/Plan: Problem #1 chest pain: Presently feels better. Problem #2 end-stage renal disease: She is status post hemodialysis yesterday. She doesn't have any nausea or vomiting. Problem #3 difficulty breathing possibly a combination of COPD I/pleural effusion/CHF. Today she is feeling better. Patient however on oxygen for some time. Problem #4 history of diabetes Problem #5 hypertension: Her blood pressure seems to be reasonably controlled  Problem #6 anemia: Her hemoglobin is slightly below our target goal. Presently she is on Epogen Problem #7 history of Aortic stenosis Plan: 1] Patient does require dialysis today 2] Her next dialysis will be tomorrow which is her regular schedule. Patient will go to her regular units after discharge today.     Manases Etchison S 01/01/2016,9:08 AM

## 2016-01-01 NOTE — Discharge Instructions (Signed)
Follow with Primary MD Robert Bellow, MD in 7 days   Get CBC, CMP, 2 view Chest X ray checked  by Primary MD next visit.    Activity: As tolerated with Full fall precautions use walker/cane & assistance as needed   Disposition Home    Diet:   Renal - Check your Weight same time everyday, if you gain over 2 pounds, or you develop in leg swelling, experience more shortness of breath or chest pain, call your Primary MD immediately. Follow Cardiac Low Salt Diet and 1.2 lit/day fluid restriction.   On your next visit with your primary care physician please Get Medicines reviewed and adjusted.   Please request your Prim.MD to go over all Hospital Tests and Procedure/Radiological results at the follow up, please get all Hospital records sent to your Prim MD by signing hospital release before you go home.   If you experience worsening of your admission symptoms, develop shortness of breath, life threatening emergency, suicidal or homicidal thoughts you must seek medical attention immediately by calling 911 or calling your MD immediately  if symptoms less severe.  You Must read complete instructions/literature along with all the possible adverse reactions/side effects for all the Medicines you take and that have been prescribed to you. Take any new Medicines after you have completely understood and accpet all the possible adverse reactions/side effects.   Do not drive, operating heavy machinery, perform activities at heights, swimming or participation in water activities or provide baby sitting services if your were admitted for syncope or siezures until you have seen by Primary MD or a Neurologist and advised to do so again.  Do not drive when taking Pain medications.    Do not take more than prescribed Pain, Sleep and Anxiety Medications  Special Instructions: If you have smoked or chewed Tobacco  in the last 2 yrs please stop smoking, stop any regular Alcohol  and or any Recreational  drug use.  Wear Seat belts while driving.   Please note  You were cared for by a hospitalist during your hospital stay. If you have any questions about your discharge medications or the care you received while you were in the hospital after you are discharged, you can call the unit and asked to speak with the hospitalist on call if the hospitalist that took care of you is not available. Once you are discharged, your primary care physician will handle any further medical issues. Please note that NO REFILLS for any discharge medications will be authorized once you are discharged, as it is imperative that you return to your primary care physician (or establish a relationship with a primary care physician if you do not have one) for your aftercare needs so that they can reassess your need for medications and monitor your lab values.

## 2016-01-01 NOTE — Consult Note (Signed)
   [facility] CM Inpatient Consult   01/01/2016  Malini Lehrmann Lapka 06-Jan-1951 [id number]  Patient screened [facility] care management services, however this patient is not eligible for [facility] services due to primary care provider, Dr. Karie Kirks, is no longer a [facility] provider.  For questions please contact: Royetta Crochet. Laymond Purser, RN, BSN, [place] [phone]) Business Cell  [phone]) Toll Free Office

## 2016-01-01 NOTE — Care Management Note (Signed)
Case Management Note  Patient Details  Name: Anita [name] MRN: [id number] Date of Birth: [birth year]  Subjective/Objective: Spoke with patient who is alert and oriented from home with husband. Patient sitting on side of bed eating breakfast. Patient stated that she has walker and nebulizer machine at home . O2 is provided in the home by [facility].  Husband transports patient to appointments and dialysis. Denies issues with medications insured with PCP.                      Action/Plan: Home with self care and family support.    Expected Discharge Date:                  Expected Discharge Plan:  Home/Self Care  In-House Referral:     Discharge planning Services  CM Consult  Post Acute Care Choice:    Choice offered to:  NA  DME Arranged:    DME Agency:     HH Arranged:    [place] Agency:     Status of Service:  Completed, signed off  Medicare Important Message Given:    Date Medicare IM Given:    Medicare IM give by:    Date Additional Medicare IM Given:    Additional Medicare Important Message give by:     If discussed at [place] of Stay Meetings, dates discussed:    Additional Comments:  Alvie Heidelberg, RN 01/01/2016, 9:08 AM

## 2016-01-01 NOTE — Progress Notes (Signed)
Anita [name] discharged Home per MD order.  Discharge instructions reviewed and discussed with the patient, all questions and concerns answered. Copy of instructions and home med given to patient.    Medication List    TAKE these medications        albuterol (2.5 MG/3ML) 0.083% nebulizer solution  Commonly known as:  PROVENTIL  Take 2.5 mg by nebulization 2 (two) times daily. *May take up to 4 times daily*     ALPRAZolam 1 MG tablet  Commonly known as:  XANAX  Take 0.5-1 mg by mouth 4 (four) times daily. Patient takes 1/2 tablet 2-3 times daily and 1 tablet at bedtime     calcium carbonate 500 MG chewable tablet  Commonly known as:  TUMS - dosed in mg elemental calcium  Chew 1 tablet by mouth 2 (two) times daily as needed for heartburn (Takes with snacks. In place of Renvela).     diltiazem 240 MG 24 hr capsule  Commonly known as:  TIAZAC  Take 240 mg by mouth daily.     ELIQUIS 5 MG Tabs tablet  Generic drug:  apixaban  Take 5 mg by mouth 2 (two) times daily.     hydrALAZINE 100 MG tablet  Commonly known as:  APRESOLINE  Take 100 mg by mouth 3 (three) times daily.     HYDROcodone-acetaminophen 7.5-325 MG tablet  Commonly known as:  NORCO  Take 1 tablet by mouth every 6 (six) hours as needed for pain.     ipratropium-albuterol 0.5-2.5 (3) MG/3ML Soln  Commonly known as:  DUONEB  Take 3 mLs by nebulization every 4 (four) hours as needed.     meclizine 25 MG tablet  Commonly known as:  ANTIVERT  Take 25 mg by mouth 4 (four) times daily as needed for dizziness.     metoprolol tartrate 25 MG tablet  Commonly known as:  LOPRESSOR  Take 0.5 tablets (12.5 mg total) by mouth 2 (two) times daily.     multivitamin Tabs tablet  Take 1 tablet by mouth at bedtime.     nitroGLYCERIN 0.4 MG SL tablet  Commonly known as:  NITROSTAT  Place 1 tablet (0.4 mg total) under the tongue every 5 (five) minutes as needed for chest pain.     omeprazole 20 MG capsule  Commonly known as:   PRILOSEC  Take 20 mg by mouth daily as needed (acid reflux).     ondansetron 4 MG tablet  Commonly known as:  ZOFRAN  Take 4 mg by mouth at bedtime.     polyethylene glycol packet  Commonly known as:  MIRALAX / GLYCOLAX  Take 17 g by mouth daily.     sevelamer carbonate 800 MG tablet  Commonly known as:  RENVELA  Take 1,600-2,400 mg by mouth 3 (three) times daily with meals. 3 with meals and 2 with snacks     Thyroid 97.5 MG Tabs  Take 195 mg by mouth daily.     VITAMIN B 12 PO  Take 1,000 mg by mouth daily.     zolpidem 10 MG tablet  Commonly known as:  AMBIEN  Take 10 mg by mouth at bedtime as needed for sleep.        Patients skin is clean, dry and intact, no evidence of skin break down. IV site discontinued and catheter remains intact. Site without signs and symptoms of complications.  Patient escorted to car by NT in a wheelchair,  no distress noted upon discharge.  Horris Latino  M Pristine Gladhill 01/01/2016 11:04 AM

## 2016-01-01 NOTE — Discharge Summary (Signed)
Anita [name], is a 65 y.o. female  DOB 12-18-1950  MRN [id number].  Admission date:  12/30/2015  Admitting Physician  Theressa Millard, MD  Discharge Date:  01/01/2016   Primary MD  Robert Bellow, MD  Recommendations for primary care physician for things to follow:   Monitor risk factors for CAD, needs close outpatient Cards follow up for Mod-Severe AS.   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)   CAD- non obstructive CAD 2009   ESRD on hemodialysis (HCC)   Chronic diastolic heart failure (HCC)   PAF (paroxysmal atrial fibrillation) ([place])   Essential hypertension      Past Medical History  Diagnosis Date  . Neuropathy due to secondary diabetes ([place])   . Diabetic nephropathy ([place])   . Hypertension   . Anemia   . Dyslipidemia   . Laryngeal mass   . COPD (chronic obstructive pulmonary disease) ([place])   . GERD (gastroesophageal reflux disease)   . Sleep apnea     Does not use CPAP- due to weight loss  . Atrial fibrillation ([place])     Diagnosed 11/14 of undetermined age of onset    . 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 CAD (coronary artery disease) 07/13/2013    Catheterization 5 years ago by Dr. Elisabeth Cara with reportedly nonobstructive disease Calcification noted on CT scan of chest in November of 2014   . ESRD on hemodialysis ([place])   . Chronic diastolic heart failure ([place])   . Aortic stenosis 12/23/2012  . 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 Atherosclerosis of aorta ([place])   . PONV (postoperative nausea and vomiting)   . Shortness of breath dyspnea     with exertion  . Wears glasses   . CHF (congestive heart failure) ([place])   . Thyroid neoplasm     of uncertain behavior  . Diabetic neuropathy [facility])     Past Surgical History  Procedure Laterality Date  . Cholecystectomy    . Appendectomy    . Shoulder arthroscopy Right     w/ repair of rotator cuff repair  . Elbow tendon surgery Right   . Achilles tendon repair Right   . Cesarean section      X 2   . Hemorrhoid surgery      many years ago  . Av fistula placement Left 12/29/2012    Procedure: ARTERIOVENOUS (AV) FISTULA CREATION;  Surgeon: Elam Dutch, MD;  Location: [facility] OR;  Service: Vascular;  Laterality: Left;  Creation Left Brachial Cephalic Arteriovenous Fistula  . Cardiac catheterization    . Laryngectomy      mass removed- noncancerous  . Lumbar laminectomy  lower back  . Thyroidectomy N/A 10/19/2014    Procedure: TOTAL THYROIDECTOMY;  Surgeon: Armandina Gemma, MD;  Location: [facility];  Service: General;  Laterality: N/A;       HPI  from the history and physical done on the day of admission:    Anita [name] is a 65 y.o. female with a history of ESRD on HD (MWF), CAD, Diastolic CHF, COPD, HTN, DM2 who presents to the ED with complaints of Chest pain rated at 10/10 associated with SOB, and Nausea that started this afternoon and was relieved after 1 SL NTG. The pain was described as Chest Tightness and located in the Left Chest area. Her initial Cardiac Evaluation in the ED has been Negative and she has been referred for observation and further cardiac workup.     Hospital Course:     1.Chest pain in a patient with history of CAD and still smoking. EKG nonacute, troponin trend and non-ACS pattern and flat, currently chest pain-free, cardiology consulted Per cardiology no further cardiology workup she may get outpatient echogram and  stress test in. EKG and chest x-ray were nonacute. Continue beta blocker, Eliquis for secondary prevention and monitor risk factors.  2. ESRD. On M, W, F schedule, renal consulted and will be dialyzed yesterday.  3. COPD. On home oxygen. Stable no wheezing. Supportive care only.  4. Paroxysmal atrial fibrillation. [place] vasc 2 score of greater than 4. Currently on Cardizem, beta blocker and Eliquis. Continue.  5. Essential hypertension. Stable continue present medications which include beta blocker, Cardizem, ACE inhibitor.  6. GERD. On PPI.  7. Smoking. Counseled to quit  8. Moderate to severe AS. Stable supportive care. Defer to cardiology any further outpt workup.  9. Chronic diastolic CHF. EF 60% on recent echogram. Compensated.    Follow UP  Follow-up Information    Follow up with Robert Bellow, MD. Schedule an appointment as soon as possible for a visit in 1 week.   Specialty:  Family Medicine   Contact information:   [place] [id number] [phone]       Follow up with Kate Sable, MD. Schedule an appointment as soon as possible for a visit in 1 week.   Specialty:  Cardiology   Contact information:   [place] [address] [phone]        Consults obtained - Cards, [id number]  Discharge Condition: Fair  Diet and Activity recommendation: See Discharge Instructions below  Discharge Instructions       Discharge Instructions    Discharge instructions    Complete by:  As directed   Follow with Primary MD Robert Bellow, MD in 7 days   Get CBC, CMP, 2 view Chest X ray checked  by Primary MD next visit.    Activity: As tolerated with Full fall precautions use walker/cane & assistance as needed   Disposition Home    Diet:   Renal - Check your Weight same time everyday, if you gain over 2 pounds, or you develop in leg swelling, experience more shortness of breath or chest pain, call your Primary MD  immediately. Follow Cardiac Low Salt Diet and 1.2 lit/day fluid restriction.   On your next visit with your primary care physician please Get Medicines reviewed and adjusted.   Please request your Prim.MD to go over all Hospital Tests and Procedure/Radiological results at the follow up, please get all Hospital records sent to your Prim MD by signing hospital release  before you go home.   If you experience worsening of your admission symptoms, develop shortness of breath, life threatening emergency, suicidal or homicidal thoughts you must seek medical attention immediately by calling 911 or calling your MD immediately  if symptoms less severe.  You Must read complete instructions/literature along with all the possible adverse reactions/side effects for all the Medicines you take and that have been prescribed to you. Take any new Medicines after you have completely understood and accpet all the possible adverse reactions/side effects.   Do not drive, operating heavy machinery, perform activities at heights, swimming or participation in water activities or provide baby sitting services if your were admitted for syncope or siezures until you have seen by Primary MD or a Neurologist and advised to do so again.  Do not drive when taking Pain medications.    Do not take more than prescribed Pain, Sleep and Anxiety Medications  Special Instructions: If you have smoked or chewed Tobacco  in the last 2 yrs please stop smoking, stop any regular Alcohol  and or any Recreational drug use.  Wear Seat belts while driving.   Please note  You were cared for by a hospitalist during your hospital stay. If you have any questions about your discharge medications or the care you received while you were in the hospital after you are discharged, you can call the unit and asked to speak with the hospitalist on call if the hospitalist that took care of you is not available. Once you are discharged, your primary  care physician will handle any further medical issues. Please note that NO REFILLS for any discharge medications will be authorized once you are discharged, as it is imperative that you return to your primary care physician (or establish a relationship with a primary care physician if you do not have one) for your aftercare needs so that they can reassess your need for medications and monitor your lab values.     Increase activity slowly    Complete by:  As directed              Discharge Medications       Medication List    TAKE these medications        albuterol (2.5 MG/3ML) 0.083% nebulizer solution  Commonly known as:  PROVENTIL  Take 2.5 mg by nebulization 2 (two) times daily. *May take up to 4 times daily*     ALPRAZolam 1 MG tablet  Commonly known as:  XANAX  Take 0.5-1 mg by mouth 4 (four) times daily. Patient takes 1/2 tablet 2-3 times daily and 1 tablet at bedtime     calcium carbonate 500 MG chewable tablet  Commonly known as:  TUMS - dosed in mg elemental calcium  Chew 1 tablet by mouth 2 (two) times daily as needed for heartburn (Takes with snacks. In place of Renvela).     diltiazem 240 MG 24 hr capsule  Commonly known as:  TIAZAC  Take 240 mg by mouth daily.     ELIQUIS 5 MG Tabs tablet  Generic drug:  apixaban  Take 5 mg by mouth 2 (two) times daily.     hydrALAZINE 100 MG tablet  Commonly known as:  APRESOLINE  Take 100 mg by mouth 3 (three) times daily.     HYDROcodone-acetaminophen 7.5-325 MG tablet  Commonly known as:  NORCO  Take 1 tablet by mouth every 6 (six) hours as needed for pain.     ipratropium-albuterol 0.5-2.5 (3) MG/3ML  Soln  Commonly known as:  DUONEB  Take 3 mLs by nebulization every 4 (four) hours as needed.     meclizine 25 MG tablet  Commonly known as:  ANTIVERT  Take 25 mg by mouth 4 (four) times daily as needed for dizziness.     metoprolol tartrate 25 MG tablet  Commonly known as:  LOPRESSOR  Take 0.5 tablets (12.5 mg  total) by mouth 2 (two) times daily.     multivitamin Tabs tablet  Take 1 tablet by mouth at bedtime.     nitroGLYCERIN 0.4 MG SL tablet  Commonly known as:  NITROSTAT  Place 1 tablet (0.4 mg total) under the tongue every 5 (five) minutes as needed for chest pain.     omeprazole 20 MG capsule  Commonly known as:  PRILOSEC  Take 20 mg by mouth daily as needed (acid reflux).     ondansetron 4 MG tablet  Commonly known as:  ZOFRAN  Take 4 mg by mouth at bedtime.     polyethylene glycol packet  Commonly known as:  MIRALAX / GLYCOLAX  Take 17 g by mouth daily.     sevelamer carbonate 800 MG tablet  Commonly known as:  RENVELA  Take 1,600-2,400 mg by mouth 3 (three) times daily with meals. 3 with meals and 2 with snacks     Thyroid 97.5 MG Tabs  Take 195 mg by mouth daily.     VITAMIN B 12 PO  Take 1,000 mg by mouth daily.     zolpidem 10 MG tablet  Commonly known as:  AMBIEN  Take 10 mg by mouth at bedtime as needed for sleep.        Major procedures and Radiology Reports - PLEASE review detailed and final reports for all details, in brief -       Dg Chest Port 1 View  12/30/2015  CLINICAL DATA:  65 year old female with chest pain EXAM: PORTABLE CHEST 1 VIEW COMPARISON:  Chest CT dated 10/22/2015 FINDINGS: Single portable view of the chest demonstrate small bilateral pleural effusions with hazy bilateral mid to lower lung lung field interstitial and airspace densities. There is apparent focal area of pleural thickening or along the lower lateral pleural surface which may represent loculated fluid or focal pleural thickening. There is no pneumothorax. Moderate to severe cardiomegaly. No acute osseous pathology. IMPRESSION: Cardiomegaly with small bilateral pleural effusions. Bilateral mid to lower lung field interstitial densities represent congestive changes or edema. Pneumonia is not excluded. Clinical correlation is recommended. Electronically Signed   By: Anner Crete  M.D.   On: 12/30/2015 22:45    Micro Results      No results found for this or any previous visit (from the past 240 hour(s)).     Today   Subjective    Anita [name] today has no headache,no chest abdominal pain,no new weakness tingling or numbness, feels much better wants to go home today.     Objective   Blood pressure 143/60, pulse 70, temperature 97.6 F (36.4 C), temperature source Oral, resp. rate 20, height 5\' 6"  (1.676 m), weight 60 kg (132 lb 4.4 oz), SpO2 93 %.   Intake/Output Summary (Last 24 hours) at 01/01/16 0739 Last data filed at 12/31/15 1900  Gross per 24 hour  Intake    720 ml  Output   2000 ml  Net  -1280 ml    Exam Awake Alert, Oriented x 3, No new F.N deficits, Normal affect [place].AT,PERRAL Supple Neck,No JVD, No  cervical lymphadenopathy appriciated.  Symmetrical Chest wall movement, Good air movement bilaterally, CTAB RRR,No Gallops,Rubs or new Murmurs, No Parasternal Heave +ve B.Sounds, Abd Soft, Non tender, No organomegaly appriciated, No rebound -guarding or rigidity. No Cyanosis, Clubbing or edema, No new Rash or bruise   Data Review   CBC w Diff: Lab Results  Component Value Date   WBC 4.6 12/31/2015   HGB 9.6* 12/31/2015   HCT 31.1* 12/31/2015   PLT 129* 12/31/2015   LYMPHOPCT 24 08/28/2015   MONOPCT 9 08/28/2015   EOSPCT 1 08/28/2015   BASOPCT 0 08/28/2015    CMP: Lab Results  Component Value Date   NA 136 01/01/2016   K 3.8 01/01/2016   CL 99* 01/01/2016   CO2 30 01/01/2016   BUN 29* 01/01/2016   CREATININE 2.88* 01/01/2016   PROT 5.4* 10/23/2015   ALBUMIN 3.0* 01/01/2016   BILITOT 0.5 10/23/2015   ALKPHOS 56 10/23/2015   AST 19 10/23/2015   ALT 10* 10/23/2015  .   Total Time in preparing paper work, data evaluation and todays exam - 35 minutes  Thurnell Lose M.D on 01/01/2016 at 7:39 AM  [facility]   Office  [phone]

## 2016-02-14 ENCOUNTER — Emergency Department (HOSPITAL_COMMUNITY): Payer: Medicare Other

## 2016-02-14 ENCOUNTER — Inpatient Hospital Stay (HOSPITAL_COMMUNITY)
Admission: EM | Admit: 2016-02-14 | Discharge: 2016-02-16 | DRG: 388 | Disposition: A | Discharge status: Home / Self Care | Payer: Medicare Other | Attending: Internal Medicine | Admitting: Internal Medicine

## 2016-02-14 ENCOUNTER — Encounter (HOSPITAL_COMMUNITY): Payer: Self-pay

## 2016-02-14 DIAGNOSIS — Z992 Dependence on renal dialysis: Secondary | ICD-10-CM

## 2016-02-14 DIAGNOSIS — R1084 Generalized abdominal pain: Secondary | ICD-10-CM | POA: Diagnosis not present

## 2016-02-14 DIAGNOSIS — E1122 Type 2 diabetes mellitus with diabetic chronic kidney disease: Secondary | ICD-10-CM | POA: Diagnosis present

## 2016-02-14 DIAGNOSIS — Z818 Family history of other mental and behavioral disorders: Secondary | ICD-10-CM

## 2016-02-14 DIAGNOSIS — E1121 Type 2 diabetes mellitus with diabetic nephropathy: Secondary | ICD-10-CM | POA: Diagnosis present

## 2016-02-14 DIAGNOSIS — Z820 Family history of epilepsy and other diseases of the nervous system: Secondary | ICD-10-CM

## 2016-02-14 DIAGNOSIS — E43 Unspecified severe protein-calorie malnutrition: Secondary | ICD-10-CM | POA: Diagnosis present

## 2016-02-14 DIAGNOSIS — Z79891 Long term (current) use of opiate analgesic: Secondary | ICD-10-CM

## 2016-02-14 DIAGNOSIS — I5032 Chronic diastolic (congestive) heart failure: Secondary | ICD-10-CM | POA: Diagnosis present

## 2016-02-14 DIAGNOSIS — Z6820 Body mass index (BMI) 20.0-20.9, adult: Secondary | ICD-10-CM

## 2016-02-14 DIAGNOSIS — N186 End stage renal disease: Secondary | ICD-10-CM | POA: Diagnosis present

## 2016-02-14 DIAGNOSIS — E039 Hypothyroidism, unspecified: Secondary | ICD-10-CM | POA: Diagnosis present

## 2016-02-14 DIAGNOSIS — K565 Intestinal adhesions [bands] with obstruction (postprocedural) (postinfection): Secondary | ICD-10-CM | POA: Diagnosis not present

## 2016-02-14 DIAGNOSIS — J449 Chronic obstructive pulmonary disease, unspecified: Secondary | ICD-10-CM | POA: Diagnosis present

## 2016-02-14 DIAGNOSIS — E8889 Other specified metabolic disorders: Secondary | ICD-10-CM | POA: Diagnosis present

## 2016-02-14 DIAGNOSIS — Z7901 Long term (current) use of anticoagulants: Secondary | ICD-10-CM

## 2016-02-14 DIAGNOSIS — I480 Paroxysmal atrial fibrillation: Secondary | ICD-10-CM | POA: Diagnosis present

## 2016-02-14 DIAGNOSIS — D649 Anemia, unspecified: Secondary | ICD-10-CM | POA: Diagnosis present

## 2016-02-14 DIAGNOSIS — Z833 Family history of diabetes mellitus: Secondary | ICD-10-CM

## 2016-02-14 DIAGNOSIS — K219 Gastro-esophageal reflux disease without esophagitis: Secondary | ICD-10-CM | POA: Diagnosis present

## 2016-02-14 DIAGNOSIS — K56609 Unspecified intestinal obstruction, unspecified as to partial versus complete obstruction: Secondary | ICD-10-CM | POA: Diagnosis present

## 2016-02-14 DIAGNOSIS — E1140 Type 2 diabetes mellitus with diabetic neuropathy, unspecified: Secondary | ICD-10-CM | POA: Diagnosis present

## 2016-02-14 DIAGNOSIS — G4730 Sleep apnea, unspecified: Secondary | ICD-10-CM | POA: Diagnosis present

## 2016-02-14 DIAGNOSIS — Z8249 Family history of ischemic heart disease and other diseases of the circulatory system: Secondary | ICD-10-CM

## 2016-02-14 DIAGNOSIS — Z811 Family history of alcohol abuse and dependence: Secondary | ICD-10-CM

## 2016-02-14 DIAGNOSIS — Z9049 Acquired absence of other specified parts of digestive tract: Secondary | ICD-10-CM

## 2016-02-14 DIAGNOSIS — F17210 Nicotine dependence, cigarettes, uncomplicated: Secondary | ICD-10-CM | POA: Diagnosis present

## 2016-02-14 DIAGNOSIS — Z825 Family history of asthma and other chronic lower respiratory diseases: Secondary | ICD-10-CM

## 2016-02-14 DIAGNOSIS — E11319 Type 2 diabetes mellitus with unspecified diabetic retinopathy without macular edema: Secondary | ICD-10-CM | POA: Diagnosis present

## 2016-02-14 DIAGNOSIS — E785 Hyperlipidemia, unspecified: Secondary | ICD-10-CM | POA: Diagnosis present

## 2016-02-14 DIAGNOSIS — I132 Hypertensive heart and chronic kidney disease with heart failure and with stage 5 chronic kidney disease, or end stage renal disease: Secondary | ICD-10-CM | POA: Diagnosis present

## 2016-02-14 MED ORDER — ONDANSETRON HCL 4 MG/2ML IJ SOLN
4.0000 mg | Freq: Once | INTRAMUSCULAR | Status: AC
  Administered 2016-02-14: 4 mg via INTRAVENOUS
  Filled 2016-02-14: qty 2

## 2016-02-14 MED ORDER — FENTANYL CITRATE (PF) 100 MCG/2ML IJ SOLN
50.0000 ug | Freq: Once | INTRAMUSCULAR | Status: AC
  Administered 2016-02-14: via INTRAVENOUS
  Filled 2016-02-14: qty 2

## 2016-02-14 MED ORDER — MORPHINE SULFATE (PF) 4 MG/ML IV SOLN
4.0000 mg | Freq: Once | INTRAVENOUS | Status: AC
  Administered 2016-02-15: 4 mg via INTRAVENOUS
  Filled 2016-02-14: qty 1

## 2016-02-14 NOTE — ED Notes (Signed)
Pt c/o acute onset of generalized abd pain that started tonight.  Pt vomited x 1 at home.

## 2016-02-14 NOTE — ED Provider Notes (Signed)
CSN: [id number]     Arrival date & time 02/14/16  2317 History  By signing my name below, I, Dora Sims, attest that this documentation has been prepared under the direction and in the presence of physician practitioner, Rolland Porter, MD at 23:22 PM. Electronically Signed: Dora Sims, Scribe. 02/14/2016. 11:24 PM.    Chief Complaint  Patient presents with  . Abdominal Pain    The history is provided by the patient. No language interpreter was used.    Level 5 caveat due to pain and need for intervention  HPI Comments: Anita [name] is a 65 y.o. female brought in by EMS, with extensive PMHx, including colitis, who presents to the Emergency Department complaining of sudden onset, constant, severe, generalized abdominal pain beginning one hour PTA. Pt reports that her stomach feels bloated. Pt states that she vomited x1 shortly PTA. She states "this is a colitis attack". She reports that she has not experienced colitis in a year. Pt has surgical history of C-section x2, appendectomy, and cholecystectomy. She is a dialysis patient and is followed at [facility]. She gets dialysis on M W F (in the morning).  She denies diarrhea or any other associated symptoms.  PCP: Dr. Karie Kirks Nephrologist: Dr. Florene Glen  Past Medical History  Diagnosis Date  . Neuropathy due to secondary diabetes ([place])   . Diabetic nephropathy ([place])   . Hypertension   . Anemia   . Dyslipidemia   . Laryngeal mass   . COPD (chronic obstructive pulmonary disease) ([place])   . GERD (gastroesophageal reflux disease)   . Sleep apnea     Does not use CPAP- due to weight loss  . Atrial fibrillation ([place])     Diagnosed 11/14 of undetermined age of onset    . 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 CAD (coronary artery disease) 07/13/2013    Catheterization 5 years ago by Dr. Elisabeth Cara with reportedly nonobstructive disease Calcification noted on CT scan of chest in November of 2014   . ESRD on  hemodialysis ([place])   . Chronic diastolic heart failure ([place])   . Aortic stenosis 12/23/2012  . 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 Atherosclerosis of aorta ([place])   . PONV (postoperative nausea and vomiting)   . Shortness of breath dyspnea     with exertion  . Wears glasses   . CHF (congestive heart failure) ([place])   . Thyroid neoplasm     of uncertain behavior  . Diabetic neuropathy [facility])    Past Surgical History  Procedure Laterality Date  . Cholecystectomy    . Appendectomy    . Shoulder arthroscopy Right     w/ repair of rotator cuff repair  . Elbow tendon surgery Right   . Achilles tendon repair Right   . Cesarean section      X 2   . Hemorrhoid surgery      many years ago  . Av fistula placement Left 12/29/2012    Procedure: ARTERIOVENOUS (AV) FISTULA CREATION;  Surgeon: Elam Dutch, MD;  Location: [facility] OR;  Service: Vascular;  Laterality: Left;  Creation Left Brachial Cephalic Arteriovenous Fistula  . Cardiac catheterization    . Laryngectomy      mass removed- noncancerous  . Lumbar laminectomy      lower back  . Thyroidectomy N/A 10/19/2014    Procedure: TOTAL THYROIDECTOMY;  Surgeon: Armandina Gemma, MD;  Location: [place];  Service: General;  Laterality: N/A;  Family History  Problem Relation Age of Onset  . COPD Mother   . Thyroid disease Mother   . Multiple sclerosis Father   . Heart disease Father   . Heart attack Father   . Depression Brother     Suicide  . Alcohol abuse Brother   . Heart disease Brother   . Asthma Brother   . Thyroid disease Daughter   . Diabetes Maternal Aunt    Social History  Substance Use Topics  . Smoking status: Current Some Day Smoker -- 0.50 packs/day for 40 years    Types: Cigarettes, E-cigarettes    Start date: 08/05/1969  . Smokeless tobacco: Never Used  . Alcohol Use: No   Pt lives with her husband at home.  She states she smokes 1 pack of cigarettes every 2 weeks; she does not drink. Home oxygen  OB History     No data available     Review of Systems  Gastrointestinal: Positive for vomiting and abdominal pain (generalized). Negative for diarrhea.  All other systems reviewed and are negative.  Allergies  Doxycycline and Lipitor  Home Medications   Prior to Admission medications   Medication Sig Start Date End Date Taking? Authorizing Provider  albuterol (PROVENTIL) (2.5 MG/3ML) 0.083% nebulizer solution Take 2.5 mg by nebulization 2 (two) times daily. *May take up to 4 times daily*   Yes Historical Provider, MD  ALPRAZolam Duanne Moron) 1 MG tablet Take 0.5-1 mg by mouth 4 (four) times daily. Patient takes 1/2 tablet 2-3 times daily and 1 tablet at bedtime   Yes Historical Provider, MD  calcium carbonate (TUMS - DOSED IN MG ELEMENTAL CALCIUM) 500 MG chewable tablet Chew 1 tablet by mouth 2 (two) times daily as needed for heartburn (Takes with snacks. In place of [facility]).   Yes Historical Provider, MD  Cyanocobalamin (VITAMIN B 12 PO) Take 1,000 mg by mouth daily.   Yes Historical Provider, MD  diltiazem (TIAZAC) 240 MG 24 hr capsule Take 240 mg by mouth daily.   Yes Historical Provider, MD  ELIQUIS 5 MG TABS tablet Take 5 mg by mouth 2 (two) times daily.  01/01/14  Yes Historical Provider, MD  fluconazole (DIFLUCAN) 150 MG tablet Take 150 mg by mouth daily.   Yes Historical Provider, MD  hydrALAZINE (APRESOLINE) 100 MG tablet Take 100 mg by mouth 3 (three) times daily. 11/29/15  Yes Historical Provider, MD  HYDROcodone-acetaminophen (NORCO) 7.5-325 MG per tablet Take 1 tablet by mouth every 6 (six) hours as needed for pain. Patient taking differently: Take 1-2 tablets by mouth every 6 (six) hours as needed for moderate pain or severe pain.  02/15/13  Yes Regina J Roczniak, PA-C  ipratropium-albuterol (DUONEB) 0.5-2.5 (3) MG/3ML SOLN Take 3 mLs by nebulization every 4 (four) hours as needed. Patient taking differently: Take 3 mLs by nebulization 2 (two) times daily. *May take up to 4 times daily* 09/03/15  Yes  Kathie Dike, MD  meclizine (ANTIVERT) 25 MG tablet Take 25 mg by mouth 4 (four) times daily as needed for dizziness.   Yes Historical Provider, MD  megestrol (MEGACE) 40 MG tablet Take 40 mg by mouth daily.   Yes Historical Provider, MD  multivitamin (RENA-VIT) TABS tablet Take 1 tablet by mouth at bedtime. 07/18/13  Yes Blain Pais, MD  nitroGLYCERIN (NITROSTAT) 0.4 MG SL tablet Place 1 tablet (0.4 mg total) under the tongue every 5 (five) minutes as needed for chest pain. 10/24/15  Yes Smiley Houseman, MD  omeprazole (  PRILOSEC) 20 MG capsule Take 20 mg by mouth daily as needed (acid reflux).    Yes Historical Provider, MD  ondansetron (ZOFRAN) 4 MG tablet Take 4 mg by mouth at bedtime.  12/16/14  Yes Historical Provider, MD  polyethylene glycol (MIRALAX / GLYCOLAX) packet Take 17 g by mouth daily.   Yes Historical Provider, MD  sevelamer carbonate (RENVELA) 800 MG tablet Take 1,600-2,400 mg by mouth 3 (three) times daily with meals. 3 with meals and 2 with snacks   Yes Historical Provider, MD  sulfamethoxazole-trimethoprim (BACTRIM DS,SEPTRA DS) 800-160 MG tablet Take 1 tablet by mouth 2 (two) times daily.   Yes Historical Provider, MD  Thyroid 97.5 MG TABS Take 195 mg by mouth daily.   Yes Historical Provider, MD  zolpidem (AMBIEN) 10 MG tablet Take 10 mg by mouth at bedtime as needed for sleep.   Yes Historical Provider, MD  metoprolol tartrate (LOPRESSOR) 25 MG tablet Take 0.5 tablets (12.5 mg total) by mouth 2 (two) times daily. 09/03/15   Kathie Dike, MD   BP 109/72 mmHg  Pulse 80  Temp(Src) 98 F (36.7 C) (Oral)  Resp 16  SpO2 96%  Vital signs normal   Physical Exam  Constitutional: She is oriented to person, place, and time. She appears well-developed and well-nourished.  Non-toxic appearance. She does not appear ill. She appears distressed.  Moaning and groaning  HENT:  Head: Normocephalic and atraumatic.  Right Ear: External ear normal.  Left Ear: External ear  normal.  Nose: Nose normal. No mucosal edema or rhinorrhea.  Mouth/Throat: Oropharynx is clear and moist and mucous membranes are normal. No dental abscesses or uvula swelling.  Eyes: Conjunctivae and EOM are normal. Pupils are equal, round, and reactive to light.  Neck: Normal range of motion and full passive range of motion without pain. Neck supple.  Cardiovascular: Normal rate, regular rhythm and normal heart sounds.  Exam reveals no gallop and no friction rub.   No murmur heard. Pulmonary/Chest: Effort normal and breath sounds normal. No respiratory distress. She has no wheezes. She has no rhonchi. She has no rales. She exhibits no tenderness and no crepitus.  Abdominal: Soft. Normal appearance and bowel sounds are normal. She exhibits distension. There is tenderness in the right lower quadrant, suprapubic area, left upper quadrant and left lower quadrant. There is no rebound and no guarding.    Bowel sounds are diminished.  Musculoskeletal: Normal range of motion. She exhibits no edema or tenderness.  Moves all extremities well.   Neurological: She is alert and oriented to person, place, and time. She has normal strength. No cranial nerve deficit.  Skin: Skin is warm, dry and intact. No rash noted. No erythema. No pallor.  Skin color is consistent with history of dialysis.  Psychiatric: She has a normal mood and affect. Her speech is normal and behavior is normal. Her mood appears not anxious.  Nursing note and vitals reviewed.   ED Course  Procedures (including critical care time) Medications  fentaNYL (SUBLIMAZE) injection 50 mcg ( Intravenous Given 02/14/16 2335)  ondansetron (ZOFRAN) injection 4 mg (4 mg Intravenous Given 02/14/16 2334)  morphine 4 MG/ML injection 4 mg (4 mg Intravenous Given 02/15/16 0002)  diatrizoate meglumine-sodium (GASTROGRAFIN) 66-10 % solution (  Given 02/15/16 0115)  iopamidol (ISOVUE-300) 61 % injection 100 mL (100 mLs Intravenous Contrast Given 02/15/16  0134)  morphine 4 MG/ML injection 4 mg (4 mg Intravenous Given 02/15/16 0124)     DIAGNOSTIC STUDIES: Oxygen Saturation is  96% on RA, adequate by my interpretation.    COORDINATION OF CARE: 11:24 PM Discussed treatment plan with pt at bedside and pt agreed to plan.Patient was given IV fentanyl and Zofran for pain and nausea.  23:55 PM Patient did not have significant relief of her pain with fentanyl. She was then given morphine with significant relief of pain.  2:55 AM patient was given the results of her CT scan. Patient is wanting to drink. I tried to explain to her she should not drink. We also discussed NG tube which she does not want. She states her nausea has been controlled with the Zofran.   03:02 AM Dr Darrick Meigs, admit to med-surg, consult surgery in AM   Labs Review Results for orders placed or performed during the hospital encounter of 02/14/16  Comprehensive metabolic panel  Result Value Ref Range   Sodium 140 135 - 145 mmol/L   Potassium 4.0 3.5 - 5.1 mmol/L   Chloride 96 (L) 101 - 111 mmol/L   CO2 28 22 - 32 mmol/L   Glucose, Bld 125 (H) 65 - 99 mg/dL   BUN 54 (H) 6 - 20 mg/dL   Creatinine, Ser 4.29 (H) 0.44 - 1.00 mg/dL   Calcium 8.3 (L) 8.9 - 10.3 mg/dL   Total Protein 7.0 6.5 - 8.1 g/dL   Albumin 3.9 3.5 - 5.0 g/dL   AST 19 15 - 41 U/L   ALT 13 (L) 14 - 54 U/L   Alkaline Phosphatase 47 38 - 126 U/L   Total Bilirubin 0.6 0.3 - 1.2 mg/dL   GFR calc non Af Amer 10 (L) >60 mL/min   GFR calc Af Amer 11 (L) >60 mL/min   Anion gap 16 (H) 5 - 15  CBC with Differential  Result Value Ref Range   WBC 7.8 4.0 - 10.5 K/uL   RBC 5.11 3.87 - 5.11 MIL/uL   Hemoglobin 14.3 12.0 - 15.0 g/dL   HCT 47.0 (H) 36.0 - 46.0 %   MCV 92.0 78.0 - 100.0 fL   MCH 28.0 26.0 - 34.0 pg   MCHC 30.4 30.0 - 36.0 g/dL   RDW 17.3 (H) 11.5 - 15.5 %   Platelets 125 (L) 150 - 400 K/uL   Neutrophils Relative % 85 %   Neutro Abs 6.6 1.7 - 7.7 K/uL   Lymphocytes Relative 8 %   Lymphs Abs 0.6 (L)  0.7 - 4.0 K/uL   Monocytes Relative 6 %   Monocytes Absolute 0.5 0.1 - 1.0 K/uL   Eosinophils Relative 1 %   Eosinophils Absolute 0.1 0.0 - 0.7 K/uL   Basophils Relative 0 %   Basophils Absolute 0.0 0.0 - 0.1 K/uL   RBC Morphology ELLIPTOCYTES    Smear Review PLATELETS APPEAR ADEQUATE    Laboratory interpretation all normal except Chronic renal failure    Imaging Review Ct Abdomen Pelvis W Contrast  02/15/2016  CLINICAL DATA:  65 year old female with acute abdominal pain. End-stage renal disease on dialysis. EXAM: CT ABDOMEN AND PELVIS WITH CONTRAST TECHNIQUE: Multidetector CT imaging of the abdomen and pelvis was performed using the standard protocol following bolus administration of intravenous contrast. CONTRAST:  13mL ISOVUE-300 IOPAMIDOL (ISOVUE-300) INJECTION 61% COMPARISON:  Abdominal radiograph dated 02/14/2016 and CT dated 07/12/2013 FINDINGS: Partially visualized small loculated appearing right pleural effusion. Bibasilar subsegmental consolidative changes may represent atelectasis versus infiltrate. There is diffuse interstitial prominence and ground-glass density of the visualized lung bases likely a degree of edema. Top-normal cardiac size. No intra-abdominal free  air. There is diffuse mesenteric stranding with small ascites. Slight irregular appearing hepatic contour concerning for underlying cirrhosis. Clinical correlation is recommended. Cholecystectomy with mild biliary ductal dilatation. The pancreas, spleen, and the right adrenal gland appear unremarkable. There is a stable 1.8 x 2.4 cm indeterminate left adrenal nodule. There is atrophic kidneys bilaterally. Subcentimeter right renal hypodense lesion is too small to characterize but likely represent cysts. There is no hydronephrosis on either side. The visualized ureters appear unremarkable. The urinary bladder is collapsed. There is apparent diffuse thickening of the bladder wall which may be partly related to underdistention.  Cystitis is not excluded. Correlation with urinalysis recommended. The uterus is grossly unremarkable. Evaluation of the bowel is limited due to suboptimal opacification. Small amount of contrast noted mixed with gastric content within the stomach. This postsurgical changes of bowel with anastomotic sutures in the upper abdomen. There is focal area of mesentery swirling in the mid abdomen (series 2, image 48) with segmental collapse of the adjacent small bowel. There is mild dilatation of the loops of small bowel proximal to this area measuring up to 3.7 cm in diameter. Findings compatible with small bowel obstruction with transition zone in the mid abdomen, likely related to underlying adhesions. No pneumatosis. There is extensive sigmoid diverticulosis. Moderate stool noted throughout the colon. The appendix is not visualized with certainty. No inflammatory changes identified in the right lower quadrant. There is advanced aortoiliac atherosclerotic disease. There is a 2.6 cm infrarenal abdominal aortic ectasia. There is mild dilatation of the main portal vein may represent a degree of portal vein hypertension. The SMV, splenic vein, and main portal vein are otherwise patent. No portal venous gas identified. There is no adenopathy. There is diffuse subcutaneous soft tissue stranding and edema. There is osteopenia with degenerative changes of the spine. No acute fracture. IMPRESSION: Small-bowel obstruction with transition zone in the mid abdomen, likely related to underlying adhesions. Constipation with extensive colonic diverticulosis. Mild irregularity of the hepatic contour. Clinical correlation is recommended to evaluate for underlying cirrhosis. Atrophic kidneys compatible with known chronic renal failure. Small ascites, diffuse mesenteric edema, and anasarca. Small right pleural effusion with bibasilar subsegmental consolidative changes. Electronically Signed   By: Anner Crete M.D.   On: 02/15/2016 02:10    Dg Abd 2 Views  02/15/2016  CLINICAL DATA:  65 year old female with acute abdominal pain EXAM: ABDOMEN - 2 VIEW COMPARISON:  Abdominal CT dated 07/12/2013 FINDINGS: Multiple mildly dilated air-filled loops of small bowel in the abdomen measuring up to 3.5 cm and may represent small bowel obstruction versus ileus. There is moderate stool throughout the colon. There is no free air. Right upper quadrant cholecystectomy clips. Small radiopaque densities over the right renal silhouette may be related to costochondral calcification or represent small renal calculi. There is advanced aortoiliac atherosclerotic disease. Osteopenia with degenerative changes of spine. Trace bilateral pleural effusions. Right lateral pleural thickening/scarring. IMPRESSION: Mild dilatation of the small bowel, likely obstruction versus ileus. Clinical correlation and follow-up recommended. Electronically Signed   By: Anner Crete M.D.   On: 02/15/2016 00:32   I have personally reviewed and evaluated these images and lab results as part of my medical decision-making.    MDM   Final diagnoses:  SBO (small bowel obstruction) ([place])   Plan admission  Rolland Porter, MD, FACEP   I personally performed the services described in this documentation, which was scribed in my presence. The recorded information has been reviewed and considered.  Rolland Porter, MD, [facility] Sander  Rolland Porter, MD 02/15/16 [phone]

## 2016-02-15 ENCOUNTER — Emergency Department (HOSPITAL_COMMUNITY): Payer: Medicare Other

## 2016-02-15 DIAGNOSIS — Z79891 Long term (current) use of opiate analgesic: Secondary | ICD-10-CM | POA: Diagnosis not present

## 2016-02-15 DIAGNOSIS — Z8249 Family history of ischemic heart disease and other diseases of the circulatory system: Secondary | ICD-10-CM | POA: Diagnosis not present

## 2016-02-15 DIAGNOSIS — K219 Gastro-esophageal reflux disease without esophagitis: Secondary | ICD-10-CM | POA: Diagnosis present

## 2016-02-15 DIAGNOSIS — Z825 Family history of asthma and other chronic lower respiratory diseases: Secondary | ICD-10-CM | POA: Diagnosis not present

## 2016-02-15 DIAGNOSIS — E1140 Type 2 diabetes mellitus with diabetic neuropathy, unspecified: Secondary | ICD-10-CM | POA: Diagnosis present

## 2016-02-15 DIAGNOSIS — G4730 Sleep apnea, unspecified: Secondary | ICD-10-CM | POA: Diagnosis present

## 2016-02-15 DIAGNOSIS — I5032 Chronic diastolic (congestive) heart failure: Secondary | ICD-10-CM | POA: Diagnosis present

## 2016-02-15 DIAGNOSIS — Z811 Family history of alcohol abuse and dependence: Secondary | ICD-10-CM | POA: Diagnosis not present

## 2016-02-15 DIAGNOSIS — K5669 Other intestinal obstruction: Secondary | ICD-10-CM | POA: Diagnosis not present

## 2016-02-15 DIAGNOSIS — Z9049 Acquired absence of other specified parts of digestive tract: Secondary | ICD-10-CM | POA: Diagnosis not present

## 2016-02-15 DIAGNOSIS — Z992 Dependence on renal dialysis: Secondary | ICD-10-CM | POA: Diagnosis not present

## 2016-02-15 DIAGNOSIS — N186 End stage renal disease: Secondary | ICD-10-CM

## 2016-02-15 DIAGNOSIS — Z7901 Long term (current) use of anticoagulants: Secondary | ICD-10-CM | POA: Diagnosis not present

## 2016-02-15 DIAGNOSIS — Z6820 Body mass index (BMI) 20.0-20.9, adult: Secondary | ICD-10-CM | POA: Diagnosis not present

## 2016-02-15 DIAGNOSIS — K565 Intestinal adhesions [bands] with obstruction (postprocedural) (postinfection): Secondary | ICD-10-CM | POA: Diagnosis present

## 2016-02-15 DIAGNOSIS — I480 Paroxysmal atrial fibrillation: Secondary | ICD-10-CM | POA: Diagnosis present

## 2016-02-15 DIAGNOSIS — F17210 Nicotine dependence, cigarettes, uncomplicated: Secondary | ICD-10-CM | POA: Diagnosis present

## 2016-02-15 DIAGNOSIS — J449 Chronic obstructive pulmonary disease, unspecified: Secondary | ICD-10-CM | POA: Diagnosis present

## 2016-02-15 DIAGNOSIS — E1121 Type 2 diabetes mellitus with diabetic nephropathy: Secondary | ICD-10-CM | POA: Diagnosis present

## 2016-02-15 DIAGNOSIS — Z820 Family history of epilepsy and other diseases of the nervous system: Secondary | ICD-10-CM | POA: Diagnosis not present

## 2016-02-15 DIAGNOSIS — Z818 Family history of other mental and behavioral disorders: Secondary | ICD-10-CM | POA: Diagnosis not present

## 2016-02-15 DIAGNOSIS — R1084 Generalized abdominal pain: Secondary | ICD-10-CM | POA: Diagnosis present

## 2016-02-15 DIAGNOSIS — E8889 Other specified metabolic disorders: Secondary | ICD-10-CM | POA: Diagnosis present

## 2016-02-15 DIAGNOSIS — D649 Anemia, unspecified: Secondary | ICD-10-CM | POA: Diagnosis present

## 2016-02-15 DIAGNOSIS — E11319 Type 2 diabetes mellitus with unspecified diabetic retinopathy without macular edema: Secondary | ICD-10-CM | POA: Diagnosis present

## 2016-02-15 DIAGNOSIS — E43 Unspecified severe protein-calorie malnutrition: Secondary | ICD-10-CM | POA: Diagnosis present

## 2016-02-15 DIAGNOSIS — E1122 Type 2 diabetes mellitus with diabetic chronic kidney disease: Secondary | ICD-10-CM

## 2016-02-15 DIAGNOSIS — I132 Hypertensive heart and chronic kidney disease with heart failure and with stage 5 chronic kidney disease, or end stage renal disease: Secondary | ICD-10-CM | POA: Diagnosis present

## 2016-02-15 DIAGNOSIS — Z833 Family history of diabetes mellitus: Secondary | ICD-10-CM | POA: Diagnosis not present

## 2016-02-15 DIAGNOSIS — E785 Hyperlipidemia, unspecified: Secondary | ICD-10-CM | POA: Diagnosis present

## 2016-02-15 DIAGNOSIS — K56609 Unspecified intestinal obstruction, unspecified as to partial versus complete obstruction: Secondary | ICD-10-CM | POA: Diagnosis present

## 2016-02-15 DIAGNOSIS — E039 Hypothyroidism, unspecified: Secondary | ICD-10-CM | POA: Diagnosis present

## 2016-02-15 LAB — CBC
HEMATOCRIT: 41.8 % (ref 36.0–46.0)
HEMOGLOBIN: 12.5 g/dL (ref 12.0–15.0)
MCH: 27.7 pg (ref 26.0–34.0)
MCHC: 29.9 g/dL — AB (ref 30.0–36.0)
MCV: 92.7 fL (ref 78.0–100.0)
Platelets: 144 10*3/uL — ABNORMAL LOW (ref 150–400)
RBC: 4.51 MIL/uL (ref 3.87–5.11)
RDW: 17.3 % — ABNORMAL HIGH (ref 11.5–15.5)
WBC: 5.5 10*3/uL (ref 4.0–10.5)

## 2016-02-15 LAB — COMPREHENSIVE METABOLIC PANEL
ALBUMIN: 3.1 g/dL — AB (ref 3.5–5.0)
ALBUMIN: 3.9 g/dL (ref 3.5–5.0)
ALK PHOS: 37 U/L — AB (ref 38–126)
ALT: 12 U/L — ABNORMAL LOW (ref 14–54)
ALT: 13 U/L — ABNORMAL LOW (ref 14–54)
ANION GAP: 16 — AB (ref 5–15)
AST: 16 U/L (ref 15–41)
AST: 19 U/L (ref 15–41)
Alkaline Phosphatase: 47 U/L (ref 38–126)
Anion gap: 11 (ref 5–15)
BILIRUBIN TOTAL: 0.4 mg/dL (ref 0.3–1.2)
BUN: 54 mg/dL — ABNORMAL HIGH (ref 6–20)
BUN: 55 mg/dL — AB (ref 6–20)
CALCIUM: 7.9 mg/dL — AB (ref 8.9–10.3)
CO2: 28 mmol/L (ref 22–32)
CO2: 28 mmol/L (ref 22–32)
Calcium: 8.3 mg/dL — ABNORMAL LOW (ref 8.9–10.3)
Chloride: 96 mmol/L — ABNORMAL LOW (ref 101–111)
Chloride: 97 mmol/L — ABNORMAL LOW (ref 101–111)
Creatinine, Ser: 4.23 mg/dL — ABNORMAL HIGH (ref 0.44–1.00)
Creatinine, Ser: 4.29 mg/dL — ABNORMAL HIGH (ref 0.44–1.00)
GFR calc Af Amer: 11 mL/min — ABNORMAL LOW (ref >60)
GFR calc Af Amer: 12 mL/min — ABNORMAL LOW (ref >60)
GFR calc non Af Amer: 10 mL/min — ABNORMAL LOW (ref >60)
GFR, EST NON AFRICAN AMERICAN: 10 mL/min — AB (ref >60)
GLUCOSE: 125 mg/dL — AB (ref 65–99)
GLUCOSE: 92 mg/dL (ref 65–99)
POTASSIUM: 4 mmol/L (ref 3.5–5.1)
Potassium: 4.8 mmol/L (ref 3.5–5.1)
SODIUM: 140 mmol/L (ref 135–145)
Sodium: 136 mmol/L (ref 135–145)
TOTAL PROTEIN: 5.8 g/dL — AB (ref 6.5–8.1)
Total Bilirubin: 0.6 mg/dL (ref 0.3–1.2)
Total Protein: 7 g/dL (ref 6.5–8.1)

## 2016-02-15 LAB — CBC WITH DIFFERENTIAL/PLATELET
BASOS ABS: 0 10*3/uL (ref 0.0–0.1)
Basophils Relative: 0 %
Eosinophils Absolute: 0.1 10*3/uL (ref 0.0–0.7)
Eosinophils Relative: 1 %
HCT: 47 % — ABNORMAL HIGH (ref 36.0–46.0)
HEMOGLOBIN: 14.3 g/dL (ref 12.0–15.0)
LYMPHS PCT: 8 %
Lymphs Abs: 0.6 10*3/uL — ABNORMAL LOW (ref 0.7–4.0)
MCH: 28 pg (ref 26.0–34.0)
MCHC: 30.4 g/dL (ref 30.0–36.0)
MCV: 92 fL (ref 78.0–100.0)
MONO ABS: 0.5 10*3/uL (ref 0.1–1.0)
MONOS PCT: 6 %
NEUTROS ABS: 6.6 10*3/uL (ref 1.7–7.7)
Neutrophils Relative %: 85 %
Platelets: 125 10*3/uL — ABNORMAL LOW (ref 150–400)
RBC: 5.11 MIL/uL (ref 3.87–5.11)
RDW: 17.3 % — ABNORMAL HIGH (ref 11.5–15.5)
SMEAR REVIEW: ADEQUATE
WBC: 7.8 10*3/uL (ref 4.0–10.5)

## 2016-02-15 LAB — GLUCOSE, CAPILLARY
GLUCOSE-CAPILLARY: 74 mg/dL (ref 65–99)
Glucose-Capillary: 80 mg/dL (ref 65–99)
Glucose-Capillary: 80 mg/dL (ref 65–99)
Glucose-Capillary: 77 mg/dL (ref 65–99)

## 2016-02-15 LAB — MRSA PCR SCREENING: MRSA BY PCR: NEGATIVE

## 2016-02-15 MED ORDER — DILTIAZEM HCL ER COATED BEADS 240 MG PO CP24
240.0000 mg | ORAL_CAPSULE | Freq: Every day | ORAL | Status: DC
  Administered 2016-02-15 – 2016-02-16 (×2): 240 mg via ORAL
  Filled 2016-02-15 (×6): qty 1

## 2016-02-15 MED ORDER — ENOXAPARIN SODIUM 40 MG/0.4ML ~~LOC~~ SOLN: 40.0000 mg | SUBCUTANEOUS | Status: DC

## 2016-02-15 MED ORDER — TIOTROPIUM BROMIDE MONOHYDRATE 18 MCG IN CAPS
18.0000 ug | ORAL_CAPSULE | Freq: Every day | RESPIRATORY_TRACT | Status: DC
Start: 2016-02-15 — End: 2016-02-16
  Filled 2016-02-15: qty 5

## 2016-02-15 MED ORDER — HYDRALAZINE HCL 25 MG PO TABS
100.0000 mg | ORAL_TABLET | Freq: Three times a day (TID) | ORAL | Status: DC
  Administered 2016-02-15 – 2016-02-16 (×2): 100 mg via ORAL
  Filled 2016-02-15 (×3): qty 4

## 2016-02-15 MED ORDER — SODIUM CHLORIDE 0.9 % IV SOLN: 100.0000 mL | INTRAVENOUS | Status: DC | PRN

## 2016-02-15 MED ORDER — METOPROLOL TARTRATE 5 MG/5ML IV SOLN
2.5000 mg | Freq: Four times a day (QID) | INTRAVENOUS | Status: DC
  Administered 2016-02-15: 2.5 mg via INTRAVENOUS
  Filled 2016-02-15: qty 5

## 2016-02-15 MED ORDER — MORPHINE SULFATE (PF) 4 MG/ML IV SOLN
4.0000 mg | Freq: Once | INTRAVENOUS | Status: AC
  Administered 2016-02-15: 4 mg via INTRAVENOUS

## 2016-02-15 MED ORDER — MEGESTROL ACETATE 40 MG/ML PO SUSP
400.0000 mg | Freq: Every day | ORAL | Status: DC
Start: 2016-02-15 — End: 2016-02-16
  Filled 2016-02-15 (×4): qty 10

## 2016-02-15 MED ORDER — IPRATROPIUM-ALBUTEROL 0.5-2.5 (3) MG/3ML IN SOLN: 3.0000 mL | RESPIRATORY_TRACT | Status: DC | PRN

## 2016-02-15 MED ORDER — METOPROLOL TARTRATE 25 MG PO TABS
12.5000 mg | ORAL_TABLET | Freq: Two times a day (BID) | ORAL | Status: DC
  Administered 2016-02-15 – 2016-02-16 (×2): 12.5 mg via ORAL
  Filled 2016-02-15 (×2): qty 1

## 2016-02-15 MED ORDER — ALPRAZOLAM 0.5 MG PO TABS
0.5000 mg | ORAL_TABLET | Freq: Three times a day (TID) | ORAL | Status: DC
  Administered 2016-02-16: 0.5 mg via ORAL
  Filled 2016-02-15: qty 1

## 2016-02-15 MED ORDER — ONDANSETRON HCL 4 MG/2ML IJ SOLN: 4.0000 mg | Freq: Three times a day (TID) | INTRAMUSCULAR | Status: AC | PRN

## 2016-02-15 MED ORDER — ZOLPIDEM TARTRATE 5 MG PO TABS
5.0000 mg | ORAL_TABLET | Freq: Every evening | ORAL | Status: DC | PRN
  Administered 2016-02-15: 5 mg via ORAL
  Filled 2016-02-15: qty 1

## 2016-02-15 MED ORDER — PENTAFLUOROPROP-TETRAFLUOROETH EX AERO: 1.0000 "application " | INHALATION_SPRAY | CUTANEOUS | Status: DC | PRN

## 2016-02-15 MED ORDER — THYROID 97.5 MG PO TABS: 195.0000 mg | ORAL_TABLET | Freq: Every day | ORAL | Status: DC

## 2016-02-15 MED ORDER — ALPRAZOLAM 0.5 MG PO TABS: 0.5000 mg | ORAL_TABLET | Freq: Four times a day (QID) | ORAL | Status: DC

## 2016-02-15 MED ORDER — ONDANSETRON HCL 4 MG PO TABS: 4.0000 mg | ORAL_TABLET | Freq: Four times a day (QID) | ORAL | Status: DC | PRN

## 2016-02-15 MED ORDER — ONDANSETRON HCL 4 MG/2ML IJ SOLN: 4.0000 mg | Freq: Four times a day (QID) | INTRAMUSCULAR | Status: DC | PRN

## 2016-02-15 MED ORDER — HYDRALAZINE HCL 20 MG/ML IJ SOLN: 10.0000 mg | INTRAMUSCULAR | Status: DC | PRN

## 2016-02-15 MED ORDER — LIDOCAINE HCL (PF) 1 % IJ SOLN: 5.0000 mL | INTRAMUSCULAR | Status: DC | PRN

## 2016-02-15 MED ORDER — LORAZEPAM 2 MG/ML IJ SOLN
0.5000 mg | Freq: Four times a day (QID) | INTRAMUSCULAR | Status: DC | PRN
  Administered 2016-02-15: 0.5 mg via INTRAVENOUS
  Filled 2016-02-15: qty 1

## 2016-02-15 MED ORDER — MORPHINE SULFATE (PF) 2 MG/ML IV SOLN
2.0000 mg | INTRAVENOUS | Status: DC | PRN
  Administered 2016-02-16: 2 mg via INTRAVENOUS
  Filled 2016-02-15: qty 1

## 2016-02-15 MED ORDER — SODIUM CHLORIDE 0.9 % IV SOLN: INTRAVENOUS | Status: DC

## 2016-02-15 MED ORDER — PANTOPRAZOLE SODIUM 40 MG PO TBEC
40.0000 mg | DELAYED_RELEASE_TABLET | Freq: Every day | ORAL | Status: DC
  Administered 2016-02-15 – 2016-02-16 (×2): 40 mg via ORAL
  Filled 2016-02-15: qty 1

## 2016-02-15 MED ORDER — IOPAMIDOL (ISOVUE-300) INJECTION 61%
100.0000 mL | Freq: Once | INTRAVENOUS | Status: AC | PRN
  Administered 2016-02-15: 100 mL via INTRAVENOUS

## 2016-02-15 MED ORDER — ENOXAPARIN SODIUM 30 MG/0.3ML ~~LOC~~ SOLN
30.0000 mg | SUBCUTANEOUS | Status: DC
  Administered 2016-02-16: 30 mg via SUBCUTANEOUS
  Filled 2016-02-15 (×2): qty 0.3

## 2016-02-15 MED ORDER — LIDOCAINE-PRILOCAINE 2.5-2.5 % EX CREA: 1.0000 "application " | TOPICAL_CREAM | CUTANEOUS | Status: DC | PRN

## 2016-02-15 MED ORDER — BISACODYL 10 MG RE SUPP
10.0000 mg | Freq: Every day | RECTAL | Status: DC
  Administered 2016-02-15 – 2016-02-16 (×2): 10 mg via RECTAL
  Filled 2016-02-15 (×2): qty 1

## 2016-02-15 MED ORDER — INSULIN ASPART 100 UNIT/ML ~~LOC~~ SOLN
0.0000 [IU] | Freq: Three times a day (TID) | SUBCUTANEOUS | Status: DC
Start: 2016-02-15 — End: 2016-02-16

## 2016-02-15 MED ORDER — POLYETHYLENE GLYCOL 3350 17 G PO PACK
17.0000 g | PACK | Freq: Every day | ORAL | Status: DC
  Administered 2016-02-15 – 2016-02-16 (×2): 17 g via ORAL
  Filled 2016-02-15 (×2): qty 1

## 2016-02-15 MED ORDER — LEVOTHYROXINE SODIUM 100 MCG IV SOLR: 150.0000 ug | Freq: Every day | INTRAVENOUS | Status: DC

## 2016-02-15 MED ORDER — MORPHINE SULFATE (PF) 4 MG/ML IV SOLN
INTRAVENOUS | Status: AC
  Filled 2016-02-15: qty 1

## 2016-02-15 MED ORDER — ONDANSETRON HCL 4 MG PO TABS
4.0000 mg | ORAL_TABLET | Freq: Every day | ORAL | Status: DC
  Administered 2016-02-15: 4 mg via ORAL
  Filled 2016-02-15: qty 1

## 2016-02-15 MED ORDER — ALPRAZOLAM 1 MG PO TABS
1.0000 mg | ORAL_TABLET | Freq: Every day | ORAL | Status: DC
  Administered 2016-02-15: 1 mg via ORAL
  Filled 2016-02-15: qty 1

## 2016-02-15 MED ORDER — DIATRIZOATE MEGLUMINE & SODIUM 66-10 % PO SOLN
ORAL | Status: AC
  Administered 2016-02-15: 01:00:00
  Filled 2016-02-15: qty 30

## 2016-02-15 MED ORDER — MORPHINE SULFATE (PF) 4 MG/ML IV SOLN
4.0000 mg | INTRAVENOUS | Status: DC | PRN
  Administered 2016-02-15: 4 mg via INTRAVENOUS
  Filled 2016-02-15: qty 1

## 2016-02-15 MED ORDER — LEVOTHYROXINE SODIUM 100 MCG IV SOLR
100.0000 ug | Freq: Every day | INTRAVENOUS | Status: DC
  Filled 2016-02-15 (×4): qty 5

## 2016-02-15 NOTE — H&P (Signed)
TRH H&P   Patient Demographics:    Anita [name], is a 65 y.o. female  MRN: [id number]   DOB - 1950-09-24  Admit Date - 02/14/2016  Outpatient Primary MD for the patient is Robert Bellow, MD  Referring MD/NP/PA: Dr. Tomi Bamberger  Patient coming from: Home  Chief Complaint  Patient presents with  . Abdominal Pain      HPI:    Anita [name]  is a 66 y.o. female, With history of ESRD on hemodialysis, diverticulosis, paroxysmal atrial fibrillation on anticoagulation with Eliquis, hypothyroidism who came to the hospital after she started having abdominal pain. Patient also had one episode of vomiting and was concerned that she may be having diverticulitis so she came to the hospital for further evaluation. In the ED CT of the abdomen was done which showed small bowel obstruction, likely from adhesions. Patient denies chest pain or shortness of breath. No fever. She is on hemodialysis Monday Wednesday and Friday    Review of systems:    In addition to the HPI above, No Fever-chills, No Headache, No changes with Vision or hearing, No problems swallowing food or Liquids, No Chest pain, Cough or Shortness of Breath,  No [name] in stool or Urine, No dysuria, No new skin rashes or bruises, No new joints pains-aches,  No new weakness, tingling, numbness in any extremity, No recent weight gain or loss,  No significant Mental Stressors.  A full 10 point Review of Systems was done, except as stated above, all other Review of Systems were negative.   With Past History of the following :    Past Medical History  Diagnosis Date  . Neuropathy due to secondary diabetes ([place])   . Diabetic nephropathy ([place])   . Hypertension   . Anemia   . Dyslipidemia   . Laryngeal mass   . COPD (chronic obstructive pulmonary disease) ([place])   . GERD (gastroesophageal reflux disease)   . Sleep apnea     Does not use  CPAP- due to weight loss  . Atrial fibrillation ([place])     Diagnosed 11/14 of undetermined age of onset    . 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 CAD (coronary artery disease) 07/13/2013    Catheterization 5 years ago by Dr. Elisabeth Cara with reportedly nonobstructive disease Calcification noted on CT scan of chest in November of 2014   . ESRD on hemodialysis ([place])   . Chronic diastolic heart failure ([place])   . Aortic stenosis 12/23/2012  . 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 Atherosclerosis of aorta ([place])   . PONV (postoperative nausea and vomiting)   . Shortness of breath dyspnea     with exertion  . Wears glasses   . CHF (congestive heart failure) ([place])   . Thyroid neoplasm     of uncertain behavior  . Diabetic neuropathy [facility])       Past Surgical History  Procedure Laterality Date  . Cholecystectomy    . Appendectomy    .  Shoulder arthroscopy Right     w/ repair of rotator cuff repair  . Elbow tendon surgery Right   . Achilles tendon repair Right   . Cesarean section      X 2   . Hemorrhoid surgery      many years ago  . Av fistula placement Left 12/29/2012    Procedure: ARTERIOVENOUS (AV) FISTULA CREATION;  Surgeon: Elam Dutch, MD;  Location: [facility] OR;  Service: Vascular;  Laterality: Left;  Creation Left Brachial Cephalic Arteriovenous Fistula  . Cardiac catheterization    . Laryngectomy      mass removed- noncancerous  . Lumbar laminectomy      lower back  . Thyroidectomy N/A 10/19/2014    Procedure: TOTAL THYROIDECTOMY;  Surgeon: Armandina Gemma, MD;  Location: [facility];  Service: General;  Laterality: N/A;      Social History:     Social History  Substance Use Topics  . Smoking status: Current Some Day Smoker -- 0.50 packs/day for 40 years    Types: Cigarettes, E-cigarettes    Start date: 08/05/1969  . Smokeless tobacco: Never Used  . Alcohol Use: No        Family History :     Family History  Problem Relation Age of Onset  . COPD  Mother   . Thyroid disease Mother   . Multiple sclerosis Father   . Heart disease Father   . Heart attack Father   . Depression Brother     Suicide  . Alcohol abuse Brother   . Heart disease Brother   . Asthma Brother   . Thyroid disease Daughter   . Diabetes Maternal Aunt       Home Medications:   Prior to Admission medications   Medication Sig Start Date End Date Taking? Authorizing Provider  albuterol (PROVENTIL) (2.5 MG/3ML) 0.083% nebulizer solution Take 2.5 mg by nebulization 2 (two) times daily. *May take up to 4 times daily*   Yes Historical Provider, MD  ALPRAZolam Duanne Moron) 1 MG tablet Take 0.5-1 mg by mouth 4 (four) times daily. Patient takes 1/2 tablet 2-3 times daily and 1 tablet at bedtime   Yes Historical Provider, MD  calcium carbonate (TUMS - DOSED IN MG ELEMENTAL CALCIUM) 500 MG chewable tablet Chew 1 tablet by mouth 2 (two) times daily as needed for heartburn (Takes with snacks. In place of [facility]a).   Yes Historical Provider, MD  Cyanocobalamin (VITAMIN B 12 PO) Take 1,000 mg by mouth daily.   Yes Historical Provider, MD  diltiazem (TIAZAC) 240 MG 24 hr capsule Take 240 mg by mouth daily.   Yes Historical Provider, MD  ELIQUIS 5 MG TABS tablet Take 5 mg by mouth 2 (two) times daily.  01/01/14  Yes Historical Provider, MD  fluconazole (DIFLUCAN) 150 MG tablet Take 150 mg by mouth daily.   Yes Historical Provider, MD  hydrALAZINE (APRESOLINE) 100 MG tablet Take 100 mg by mouth 3 (three) times daily. 11/29/15  Yes Historical Provider, MD  HYDROcodone-acetaminophen (NORCO) 7.5-325 MG per tablet Take 1 tablet by mouth every 6 (six) hours as needed for pain. Patient taking differently: Take 1-2 tablets by mouth every 6 (six) hours as needed for moderate pain or severe pain.  02/15/13  Yes Regina J Roczniak, PA-C  ipratropium-albuterol (DUONEB) 0.5-2.5 (3) MG/3ML SOLN Take 3 mLs by nebulization every 4 (four) hours as needed. Patient taking differently: Take 3 mLs by nebulization  2 (two) times daily. *May take up to 4 times daily* 09/03/15  Yes Kathie Dike, MD  meclizine (ANTIVERT) 25 MG tablet Take 25 mg by mouth 4 (four) times daily as needed for dizziness.   Yes Historical Provider, MD  megestrol (MEGACE) 40 MG tablet Take 40 mg by mouth daily.   Yes Historical Provider, MD  multivitamin (RENA-VIT) TABS tablet Take 1 tablet by mouth at bedtime. 07/18/13  Yes Blain Pais, MD  nitroGLYCERIN (NITROSTAT) 0.4 MG SL tablet Place 1 tablet (0.4 mg total) under the tongue every 5 (five) minutes as needed for chest pain. 10/24/15  Yes Smiley Houseman, MD  omeprazole (PRILOSEC) 20 MG capsule Take 20 mg by mouth daily as needed (acid reflux).    Yes Historical Provider, MD  ondansetron (ZOFRAN) 4 MG tablet Take 4 mg by mouth at bedtime.  12/16/14  Yes Historical Provider, MD  polyethylene glycol (MIRALAX / GLYCOLAX) packet Take 17 g by mouth daily.   Yes Historical Provider, MD  sevelamer carbonate (RENVELA) 800 MG tablet Take 1,600-2,400 mg by mouth 3 (three) times daily with meals. 3 with meals and 2 with snacks   Yes Historical Provider, MD  sulfamethoxazole-trimethoprim (BACTRIM DS,SEPTRA DS) 800-160 MG tablet Take 1 tablet by mouth 2 (two) times daily.   Yes Historical Provider, MD  Thyroid 97.5 MG TABS Take 195 mg by mouth daily.   Yes Historical Provider, MD  zolpidem (AMBIEN) 10 MG tablet Take 10 mg by mouth at bedtime as needed for sleep.   Yes Historical Provider, MD  metoprolol tartrate (LOPRESSOR) 25 MG tablet Take 0.5 tablets (12.5 mg total) by mouth 2 (two) times daily. 09/03/15   Kathie Dike, MD     Allergies:     Allergies  Allergen Reactions  . Doxycycline Nausea And Vomiting  . Lipitor [Atorvastatin] Nausea And Vomiting     Physical Exam:   Vitals  [name] pressure 127/66, pulse 80, temperature 98 F (36.7 C), temperature source Oral, resp. rate 16, SpO2 95 %.   1. General Frail-appearing elderly woman* lying in bed in NAD, cooperative with  exam  2. Normal affect and insight, Not Suicidal or Homicidal, Awake Alert, Oriented X 3.  3. No F.N deficits, ALL C.Nerves Intact, Strength 5/5 all 4 extremities, Sensation intact all 4 extremities, Plantars down going.  4. Ears and Eyes appear Normal, Conjunctivae clear, PERRLA. Moist Oral Mucosa.  5. Supple Neck, No JVD, No cervical lymphadenopathy appriciated, No Carotid Bruits.  6. Symmetrical Chest wall movement, Good air movement bilaterally, CTAB.  7. RRR, No Gallops, Rubs or Murmurs, No Parasternal Heave.  8. Positive Bowel Sounds, Abdomen Soft, mild generalized tenderness on palpation ,  No organomegaly appriciated,No rebound -guarding or rigidity.  9.  No Cyanosis, Normal Skin Turgor, No Skin Rash or Bruise.  10. Good muscle tone,  joints appear normal , no effusions, Normal ROM.      Data Review:    CBC  Recent Labs Lab 02/14/16 2349  WBC 7.8  HGB 14.3  HCT 47.0*  PLT 125*  MCV 92.0  MCH 28.0  MCHC 30.4  RDW 17.3*  LYMPHSABS 0.6*  MONOABS 0.5  EOSABS 0.1  BASOSABS 0.0   ------------------------------------------------------------------------------------------------------------------  Chemistries   Recent Labs Lab 02/14/16 2349  NA 140  K 4.0  CL 96*  CO2 28  GLUCOSE 125*  BUN 54*  CREATININE 4.29*  CALCIUM 8.3*  AST 19  ALT 13*  ALKPHOS 47  BILITOT 0.6   ------------------------------------------------------------------------------------------------------------------ CrCl cannot be calculated (Unknown ideal weight.). ------------------------------------------------------------------------------------------------------------------ No results for input(s): TSH, T4TOTAL,  T3FREE, THYROIDAB in the last 72 hours.  Invalid input(s): FREET3  Coagulation profile No results for input(s): INR, PROTIME in the last 168 hours. ------------------------------------------------------------------------------------------------------------------- No  results for input(s): DDIMER in the last 72 hours. -------------------------------------------------------------------------------------------------------------------  Cardiac Enzymes No results for input(s): CKMB, TROPONINI, MYOGLOBIN in the last 168 hours.  Invalid input(s): CK ------------------------------------------------------------------------------------------------------------------    Component Value Date/Time   BNP 2726.0* 12/30/2015 2102     ---------------------------------------------------------------------------------------------------------------  Urinalysis    Component Value Date/Time   COLORURINE YELLOW 10/22/2015 1602   APPEARANCEUR CLOUDY* 10/22/2015 1602   LABSPEC 1.017 10/22/2015 1602   PHURINE 6.5 10/22/2015 1602   GLUCOSEU 100* 10/22/2015 1602   HGBUR MODERATE* 10/22/2015 1602   BILIRUBINUR NEGATIVE 10/22/2015 1602   KETONESUR NEGATIVE 10/22/2015 1602   PROTEINUR >300* 10/22/2015 1602   NITRITE NEGATIVE 10/22/2015 1602   LEUKOCYTESUR NEGATIVE 10/22/2015 1602    ----------------------------------------------------------------------------------------------------------------   Imaging Results:    Ct Abdomen Pelvis W Contrast  02/15/2016  CLINICAL DATA:  65 year old female with acute abdominal pain. End-stage renal disease on dialysis. EXAM: CT ABDOMEN AND PELVIS WITH CONTRAST TECHNIQUE: Multidetector CT imaging of the abdomen and pelvis was performed using the standard protocol following bolus administration of intravenous contrast. CONTRAST:  13mL ISOVUE-300 IOPAMIDOL (ISOVUE-300) INJECTION 61% COMPARISON:  Abdominal radiograph dated 02/14/2016 and CT dated 07/12/2013 FINDINGS: Partially visualized small loculated appearing right pleural effusion. Bibasilar subsegmental consolidative changes may represent atelectasis versus infiltrate. There is diffuse interstitial prominence and ground-glass density of the visualized lung bases likely a degree of edema.  Top-normal cardiac size. No intra-abdominal free air. There is diffuse mesenteric stranding with small ascites. Slight irregular appearing hepatic contour concerning for underlying cirrhosis. Clinical correlation is recommended. Cholecystectomy with mild biliary ductal dilatation. The pancreas, spleen, and the right adrenal gland appear unremarkable. There is a stable 1.8 x 2.4 cm indeterminate left adrenal nodule. There is atrophic kidneys bilaterally. Subcentimeter right renal hypodense lesion is too small to characterize but likely represent cysts. There is no hydronephrosis on either side. The visualized ureters appear unremarkable. The urinary bladder is collapsed. There is apparent diffuse thickening of the bladder wall which may be partly related to underdistention. Cystitis is not excluded. Correlation with urinalysis recommended. The uterus is grossly unremarkable. Evaluation of the bowel is limited due to suboptimal opacification. Small amount of contrast noted mixed with gastric content within the stomach. This postsurgical changes of bowel with anastomotic sutures in the upper abdomen. There is focal area of mesentery swirling in the mid abdomen (series 2, image 48) with segmental collapse of the adjacent small bowel. There is mild dilatation of the loops of small bowel proximal to this area measuring up to 3.7 cm in diameter. Findings compatible with small bowel obstruction with transition zone in the mid abdomen, likely related to underlying adhesions. No pneumatosis. There is extensive sigmoid diverticulosis. Moderate stool noted throughout the colon. The appendix is not visualized with certainty. No inflammatory changes identified in the right lower quadrant. There is advanced aortoiliac atherosclerotic disease. There is a 2.6 cm infrarenal abdominal aortic ectasia. There is mild dilatation of the main portal vein may represent a degree of portal vein hypertension. The SMV, splenic vein, and main  portal vein are otherwise patent. No portal venous gas identified. There is no adenopathy. There is diffuse subcutaneous soft tissue stranding and edema. There is osteopenia with degenerative changes of the spine. No acute fracture. IMPRESSION: Small-bowel obstruction with transition zone in the mid abdomen, likely related to underlying adhesions.  Constipation with extensive colonic diverticulosis. Mild irregularity of the hepatic contour. Clinical correlation is recommended to evaluate for underlying cirrhosis. Atrophic kidneys compatible with known chronic renal failure. Small ascites, diffuse mesenteric edema, and anasarca. Small right pleural effusion with bibasilar subsegmental consolidative changes. Electronically Signed   By: Anner Crete M.D.   On: 02/15/2016 02:10   Dg Abd 2 Views  02/15/2016  CLINICAL DATA:  65 year old female with acute abdominal pain EXAM: ABDOMEN - 2 VIEW COMPARISON:  Abdominal CT dated 07/12/2013 FINDINGS: Multiple mildly dilated air-filled loops of small bowel in the abdomen measuring up to 3.5 cm and may represent small bowel obstruction versus ileus. There is moderate stool throughout the colon. There is no free air. Right upper quadrant cholecystectomy clips. Small radiopaque densities over the right renal silhouette may be related to costochondral calcification or represent small renal calculi. There is advanced aortoiliac atherosclerotic disease. Osteopenia with degenerative changes of spine. Trace bilateral pleural effusions. Right lateral pleural thickening/scarring. IMPRESSION: Mild dilatation of the small bowel, likely obstruction versus ileus. Clinical correlation and follow-up recommended. Electronically Signed   By: Anner Crete M.D.   On: 02/15/2016 00:32     Assessment & Plan:    Active Problems:   SBO (small bowel obstruction) (HCC)   Small bowel obstruction (HCC)   Paroxysmal atrial fibrillation    ESRD on hemodialysis     Hypothyroidism    1. Small bowel obstruction - Will admit the patient, NG tube ordered but patient refusing at this time. We'll keep nothing by mouth. Consult general surgery in a.m. start gentle IV hydration with normal saline at 50 mL per hour as she is hemodialysis patient. 2. Paroxysmal atrial fibrillation - patient is on anticoagulation with apixaban, will hold anticoagulation at this time. Also hold metoprolol and Cardizem. Start IV Lopressor 2.5 mg every 6 hours  3.  Hypothyroidism - continue IV Synthroid  4. Diabetes mellitus - we'll start sliding scale insulin with NovoLog    DVT Prophylaxis-   Lovenox   AM Labs Ordered, also please review Full Orders  Family Communication: no family at bedside   Code Status: Full code   Admistatus:  observation   Time spent in minutes : 60 min   Kenlea Woodell S M.D on 02/15/2016 at 3:38 AM  Between 7am to 7pm - Pager - [phone]. After 7pm go to [email or web address] - password [facility]  [facility] - Office  [phone]

## 2016-02-15 NOTE — Care Management Note (Signed)
Case Management Note  Patient Details  Name: Anita [name] MRN: [id number] Date of Birth: [birth year]  Subjective/Objective: Patient admitted from home with SBO. She is a dialysis patient, MWF schedule at [facility]. She has chronic 02 at home, provided by [facility]. Her PCP is Dr. Karie Kirks.              Action/Plan: Anticipate d/c home with self care. No CM needs identified.    Expected Discharge Date:         02/17/2016        Expected Discharge Plan:  Home/Self Care  In-House Referral:  NA  Discharge planning Services  CM Consult  Post Acute Care Choice:  NA Choice offered to:  NA  DME Arranged:    DME Agency:     HH Arranged:    HH Agency:     Status of Service:  Completed, signed off  If discussed at [employer] of Stay Meetings, dates discussed:    Additional Comments:  Rodnisha Blomgren, Chauncey Reading, RN 02/15/2016, 9:53 AM

## 2016-02-15 NOTE — Progress Notes (Signed)
This patient was admitted to the hospital earlier this morning by Dr. Darrick Meigs  Patient seen and examined. She reports one bowel movement last night prior to coming to the hospital. She is not passing any flatus at this time and has not had any further bowel movements. Abdominal pain has improved since admission. She refused NG tube. No vomiting. Abdomen is soft, nontender and has bowel sounds.  She has been admitted with SBO, likely due to adhesions. She is currently npo. Gen surgery has been consulted. She refused NG tube, but is not vomiting at this time. Would continue NPO status for now, until she starts having bowel movements. Encouraged ambulation.  She has ESRD on hemodialysis. Nephrology following and will plan on HD today.  MEMON,JEHANZEB

## 2016-02-15 NOTE — Care Management Obs Status (Signed)
[place] NOTIFICATION   Patient Details  Name: Anita [name] MRN: [id number] Date of Birth: [birth year]   Medicare Observation Status Notification Given:  Yes    Hser Belanger, Chauncey [place], RN 02/15/2016, 10:21 AM

## 2016-02-15 NOTE — Consult Note (Signed)
SURGICAL CONSULTATION NOTE (initial)  HISTORY OF PRESENT ILLNESS (HPI):  65 y.o. female presented with abdominal pain since approximately 10 pm last night and emesis x 1 episode. Patient reports one previous episode of diverticulitis and stated that she came to the hospital thinking she was experiencing another. She states that her pain resolved this morning, since which time she has been passing flatus with increasing appetite/hunger and no further pain or nausea and denies any fever/chills, CP, or SOB. Last BM was last night.  PAST MEDICAL HISTORY (PMH):  Past Medical History  Diagnosis Date  . Neuropathy due to secondary diabetes ([place])   . Diabetic nephropathy ([place])   . Hypertension   . Anemia   . Dyslipidemia   . Laryngeal mass   . COPD (chronic obstructive pulmonary disease) ([place])   . GERD (gastroesophageal reflux disease)   . Sleep apnea     Does not use CPAP- due to weight loss  . Atrial fibrillation ([place])     Diagnosed 11/14 of undetermined age of onset    . 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 CAD (coronary artery disease) 07/13/2013    Catheterization 5 years ago by Dr. Elisabeth Cara with reportedly nonobstructive disease Calcification noted on CT scan of chest in November of 2014   . ESRD on hemodialysis ([place])   . Chronic diastolic heart failure ([place])   . Aortic stenosis 12/23/2012  . 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 Atherosclerosis of aorta ([place])   . PONV (postoperative nausea and vomiting)   . Shortness of breath dyspnea     with exertion  . Wears glasses   . CHF (congestive heart failure) ([place])   . Thyroid neoplasm     of uncertain behavior  . Diabetic neuropathy ([place])      PAST SURGICAL HISTORY ([place]):  Past Surgical History  Procedure Laterality Date  . Cholecystectomy    . Appendectomy    . Shoulder arthroscopy Right     w/ repair of rotator cuff repair  . Elbow tendon surgery Right   . Achilles tendon repair Right   . Cesarean  section      X 2   . Hemorrhoid surgery      many years ago  . Av fistula placement Left 12/29/2012    Procedure: ARTERIOVENOUS (AV) FISTULA CREATION;  Surgeon: Elam Dutch, MD;  Location: [facility] OR;  Service: Vascular;  Laterality: Left;  Creation Left Brachial Cephalic Arteriovenous Fistula  . Cardiac catheterization    . Laryngectomy      mass removed- noncancerous  . Lumbar laminectomy      lower back  . Thyroidectomy N/A 10/19/2014    Procedure: TOTAL THYROIDECTOMY;  Surgeon: Armandina Gemma, MD;  Location: [place];  Service: General;  Laterality: N/A;     MEDICATIONS:  Prior to Admission medications   Medication Sig Start Date End Date Taking? Authorizing Provider  ALPRAZolam Duanne Moron) 1 MG tablet Take 0.5-1 mg by mouth 4 (four) times daily. Patient takes 1/2 tablet 2-3 times daily and 1 tablet at bedtime   Yes Historical Provider, MD  calcium carbonate (TUMS - DOSED IN MG ELEMENTAL CALCIUM) 500 MG chewable tablet Chew 1 tablet by mouth 2 (two) times daily as needed for heartburn (Takes with snacks. In place of [facility]).   Yes Historical Provider, MD  Cyanocobalamin (VITAMIN B 12 PO) Take 1,000 mg by mouth daily.   Yes Historical Provider, MD  diltiazem (TIAZAC) 240 MG 24  hr capsule Take 240 mg by mouth daily.   Yes Historical Provider, MD  ELIQUIS 5 MG TABS tablet Take 5 mg by mouth 2 (two) times daily.  01/01/14  Yes Historical Provider, MD  hydrALAZINE (APRESOLINE) 100 MG tablet Take 100 mg by mouth 3 (three) times daily. 11/29/15  Yes Historical Provider, MD  HYDROcodone-acetaminophen (NORCO) 7.5-325 MG per tablet Take 1 tablet by mouth every 6 (six) hours as needed for pain. Patient taking differently: Take 1-2 tablets by mouth every 6 (six) hours as needed for moderate pain or severe pain.  02/15/13  Yes Regina J Roczniak, PA-C  ipratropium-albuterol (DUONEB) 0.5-2.5 (3) MG/3ML SOLN Take 3 mLs by nebulization every 4 (four) hours as needed. Patient taking differently: Take 3 mLs by  nebulization 2 (two) times daily. *May take up to 4 times daily* 09/03/15  Yes Kathie Dike, MD  meclizine (ANTIVERT) 25 MG tablet Take 25 mg by mouth 4 (four) times daily as needed for dizziness.   Yes Historical Provider, MD  megestrol (MEGACE) 40 MG/ML suspension Take 400 mg by mouth daily.  02/08/16  Yes Historical Provider, MD  metoprolol tartrate (LOPRESSOR) 25 MG tablet Take 0.5 tablets (12.5 mg total) by mouth 2 (two) times daily. 09/03/15  Yes Kathie Dike, MD  multivitamin (RENA-VIT) TABS tablet Take 1 tablet by mouth at bedtime. 07/18/13  Yes Blain Pais, MD  nitroGLYCERIN (NITROSTAT) 0.4 MG SL tablet Place 1 tablet (0.4 mg total) under the tongue every 5 (five) minutes as needed for chest pain. 10/24/15  Yes Smiley Houseman, MD  omeprazole (PRILOSEC) 20 MG capsule Take 20 mg by mouth daily as needed (acid reflux).    Yes Historical Provider, MD  ondansetron (ZOFRAN) 4 MG tablet Take 4 mg by mouth at bedtime.  12/16/14  Yes Historical Provider, MD  polyethylene glycol (MIRALAX / GLYCOLAX) packet Take 17 g by mouth daily.   Yes Historical Provider, MD  PROAIR HFA 108 [phone] Base) MCG/ACT inhaler Inhale 1-2 puffs into the lungs every 6 (six) hours as needed for wheezing or shortness of breath.  02/12/16  Yes Historical Provider, MD  sevelamer carbonate (RENVELA) 800 MG tablet Take 1,600-2,400 mg by mouth 3 (three) times daily with meals. 3 with meals and 2 with snacks   Yes Historical Provider, MD  SPIRIVA HANDIHALER 18 MCG inhalation capsule Place 18 mcg into inhaler and inhale daily.  02/12/16  Yes Historical Provider, MD  Thyroid 97.5 MG TABS Take 195 mg by mouth daily.   Yes Historical Provider, MD  zolpidem (AMBIEN) 10 MG tablet Take 10 mg by mouth at bedtime as needed for sleep.   Yes Historical Provider, MD  sulfamethoxazole-trimethoprim (BACTRIM DS,SEPTRA DS) 800-160 MG tablet Take 1 tablet by mouth 2 (two) times daily. Reported on 02/15/2016    Historical Provider, MD      ALLERGIES:  Allergies  Allergen Reactions  . Doxycycline Nausea And Vomiting  . Lipitor [Atorvastatin] Nausea And Vomiting     SOCIAL HISTORY:  Social History   Social History  . Marital Status: Married    Spouse Name: N/A  . Number of Children: N/A  . Years of Education: N/A   Occupational History  . 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 Hair dresser    Social History Main Topics  . Smoking status: Current Some Day Smoker -- 0.50 packs/day for 40 years    Types: Cigarettes, E-cigarettes    Start date: 08/05/1969  . Smokeless  tobacco: Never Used  . Alcohol Use: No  . Drug Use: No  . Sexual Activity: Not Currently    Birth Control/ Protection: Post-menopausal   Other Topics Concern  . Not on file   Social History Narrative   65 yo woman who lives at home with her husband of 73 years. She has a 40 pack year history of smoking, denies etoh or illicit drug use. 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     The patient currently resides (home / rehab facility / nursing home): Home  The patient normally is (ambulatory / bedbound): Ambulatory   FAMILY HISTORY:  Family History  Problem Relation Age of Onset  . COPD Mother   . Thyroid disease Mother   . Multiple sclerosis Father   . Heart disease Father   . Heart attack Father   . Depression Brother     Suicide  . Alcohol abuse Brother   . Heart disease Brother   . Asthma Brother   . Thyroid disease Daughter   . Diabetes Maternal Aunt     REVIEW OF SYSTEMS:  Constitutional: denies weight loss, fever, chills, or sweats  Eyes: denies any other vision changes, history of eye injury  ENT: denies sore throat, hearing problems  Respiratory: denies shortness of breath, wheezing  Cardiovascular: denies chest pain, palpitations  Gastrointestinal: abdominal pain and N/V as per HPI  Musculoskeletal: denies  any other joint pains or cramps  Skin: denies any other rashes or skin discolorations  Neurological: denies any other headache, dizziness, weakness  Psychiatric: denies any other depression, anxiety   All other review of systems were negative   VITAL SIGNS:  Temp:  [98 F (36.7 C)-98.2 F (36.8 C)] 98.2 F (36.8 C) 02/24/2023 0651) Pulse Rate:  [73-80] 73 24-Feb-2023 0651) Resp:  [16-20] 20 24-Feb-2023 0651) BP: (109-147)/(42-72) 118/42 mmHg 02/24/23 0651) SpO2:  [95 %-99 %] 98 % 02/24/23 0651) Weight:  [58.423 kg (128 lb 12.8 oz)] 58.423 kg (128 lb 12.8 oz) 2023/02/24 0358)       Weight: 58.423 kg (128 lb 12.8 oz)     INTAKE/OUTPUT:  This shift:    Last 2 shifts: @IOLAST2SHIFTS @   PHYSICAL EXAM:  Constitutional:  -- Normal body habitus  -- Awake, alert, and oriented x3  Eyes:  -- Pupils equally round and reactive to light  -- No scleral icterus  Ear, nose, and throat:  -- No jugular venous distension  Pulmonary:  -- No crackles  -- Equal breath sounds bilaterally  Cardiovascular:  -- S1, S2 present  -- No pericardial rubs Abdomen:  -- Soft, nontender, nondistended, no guarding/rebound  -- No abdominal masses appreciated, pulsatile or otherwise  Musculoskeletal / Integumentary:  -- Wounds or skin discoloration: None  -- Extremities: B/L UE and LE FROM, hands and feet warm, no edema  Neurologic:  -- Motor function: intact and symmetric -- Sensation: intact and symmetric  Labs:  CBC:  Lab Results  Component Value Date   WBC 5.5 02-24-16   RBC 4.51 Feb 24, 2016   RBC 3.42* 07/15/2013   BMP:  Lab Results  Component Value Date   GLUCOSE 92 02-24-2016   CO2 28 February 24, 2016   BUN 55* 2016-02-24   CREATININE 4.23* February 24, 2016   CALCIUM 7.9* 02-24-16     Imaging studies:  CT Abdomen and Pelvis (2016/02/24) Evaluation of the bowel is limited due to suboptimal  opacification. Small amount of contrast noted mixed with gastric content within the stomach. This postsurgical changes of  bowel with anastomotic sutures in the upper abdomen. There is focal area of mesentery swirling in the mid abdomen (series 2, image 48) with segmental collapse of the adjacent small bowel. There is mild dilatation of the loops of small bowel proximal to this area measuring up to 3.7 cm in diameter. Findings compatible with small bowel obstruction with transition zone in the mid abdomen, likely related to underlying adhesions. No pneumatosis. There is extensive sigmoid diverticulosis. Moderate stool noted throughout the colon. The appendix is not visualized with certainty. No inflammatory changes identified in the right lower quadrant.  Assessment/Plan:  65 y.o. female with resolving small bowel obstruction, likely attributable to post-surgical adhesions, complicated by pertinent comorbidities including ESRD, diabetes mellitus, COPD, CAD, CHF, HTN, aortic stenosis, hyperlipidemia, atrial fibrillation, sleep apnea, and GERD.   - okay with starting clear liquids   - medical management of co-morbidities as per medical team   - ambulation encouraged, dialysis as per nephrology   - DVT prophylaxis  All of the above findings and recommendations were discussed with the patient and her medical team, and all of her questions were answered to her expressed satisfaction.  Thank you for the opportunity to participate in the care for this patient.   -- Marilynne Drivers Rosana Hoes, [place]: [facility] Office: [phone]

## 2016-02-15 NOTE — Consult Note (Signed)
Reason for Consult: End-stage renal disease Referring Physician: Dr. Hilliard Clark Anita [name] is an 65 y.o. female.  HPI: She is a patient was history of her diabetes, coronary artery disease, allergic stenosis, end-stage renal disease on maintenance hemodialysis presently came with complaints of abdominal pain, some nausea and vomiting since this morning. When she was evaluated patient seems to have small bowel obstruction hence admitted to the hospital. Presently patient says that she is feeling better but not able to remove her bowels. She has some difficulty breathing otherwise she feels okay. She denies any fever or chills or sweating.  Past Medical History  Diagnosis Date  . Neuropathy due to secondary diabetes ([place])   . Diabetic nephropathy ([place])   . Hypertension   . Anemia   . Dyslipidemia   . Laryngeal mass   . COPD (chronic obstructive pulmonary disease) ([place])   . GERD (gastroesophageal reflux disease)   . Sleep apnea     Does not use CPAP- due to weight loss  . Atrial fibrillation ([place])     Diagnosed 11/14 of undetermined age of onset    . Diabetes mellitus with end stage renal disease ([place])   . CAD (coronary artery disease) 07/13/2013    Catheterization 5 years ago by Dr. Elisabeth Cara with reportedly nonobstructive disease Calcification noted on CT scan of chest in November of 2014   . ESRD on hemodialysis ([place])   . Chronic diastolic heart failure ([place])   . Aortic stenosis 12/23/2012  . 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 Atherosclerosis of aorta ([place])   . PONV (postoperative nausea and vomiting)   . Shortness of breath dyspnea     with exertion  . Wears glasses   . CHF (congestive heart failure) ([place])   . Thyroid neoplasm     of uncertain behavior  . Diabetic neuropathy [facility])     Past Surgical History  Procedure Laterality Date  . Cholecystectomy    . Appendectomy    . Shoulder arthroscopy Right     w/ repair of rotator cuff repair  . Elbow tendon surgery Right    . Achilles tendon repair Right   . Cesarean section      X 2   . Hemorrhoid surgery      many years ago  . Av fistula placement Left 12/29/2012    Procedure: ARTERIOVENOUS (AV) FISTULA CREATION;  Surgeon: Elam Dutch, MD;  Location: [facility] OR;  Service: Vascular;  Laterality: Left;  Creation Left Brachial Cephalic Arteriovenous Fistula  . Cardiac catheterization    . Laryngectomy      mass removed- noncancerous  . Lumbar laminectomy      lower back  . Thyroidectomy N/A 10/19/2014    Procedure: TOTAL THYROIDECTOMY;  Surgeon: Armandina Gemma, MD;  Location: [facility] OR;  Service: General;  Laterality: N/A;    Family History  Problem Relation Age of Onset  . COPD Mother   . Thyroid disease Mother   . Multiple sclerosis Father   . Heart disease Father   . Heart attack Father   . Depression Brother     Suicide  . Alcohol abuse Brother   . Heart disease Brother   . Asthma Brother   . Thyroid disease Daughter   . Diabetes Maternal Aunt     Social History:  reports that she has been smoking Cigarettes and E-cigarettes.  She started smoking about 46 years ago. She has a 20 pack-year smoking history. She has never used  smokeless tobacco. She reports that she does not drink alcohol or use illicit drugs.  Allergies:  Allergies  Allergen Reactions  . Doxycycline Nausea And Vomiting  . Lipitor [Atorvastatin] Nausea And Vomiting    Medications: I have reviewed the patient's current medications.  Results for orders placed or performed during the hospital encounter of 02/14/16 (from the past 48 hour(s))  Comprehensive metabolic panel     Status: Abnormal   Collection Time: 02/14/16 11:49 PM  Result Value Ref Range   Sodium 140 135 - 145 mmol/L   Potassium 4.0 3.5 - 5.1 mmol/L   Chloride 96 (L) 101 - 111 mmol/L   CO2 28 22 - 32 mmol/L   Glucose, Bld 125 (H) 65 - 99 mg/dL   BUN 54 (H) 6 - 20 mg/dL   Creatinine, Ser 4.29 (H) 0.44 - 1.00 mg/dL   Calcium 8.3 (L) 8.9 - 10.3 mg/dL   Total  Protein 7.0 6.5 - 8.1 g/dL   Albumin 3.9 3.5 - 5.0 g/dL   AST 19 15 - 41 U/L   ALT 13 (L) 14 - 54 U/L   Alkaline Phosphatase 47 38 - 126 U/L   Total Bilirubin 0.6 0.3 - 1.2 mg/dL   GFR calc non Af Amer 10 (L) >60 mL/min   GFR calc Af Amer 11 (L) >60 mL/min    Comment: (NOTE) The eGFR has been calculated using the CKD EPI equation. This calculation has not been validated in all clinical situations. eGFR's persistently <60 mL/min signify possible Chronic Kidney Disease.    Anion gap 16 (H) 5 - 15  CBC with Differential     Status: Abnormal   Collection Time: 02/14/16 11:49 PM  Result Value Ref Range   WBC 7.8 4.0 - 10.5 K/uL   RBC 5.11 3.87 - 5.11 MIL/uL   Hemoglobin 14.3 12.0 - 15.0 g/dL   HCT 47.0 (H) 36.0 - 46.0 %   MCV 92.0 78.0 - 100.0 fL   MCH 28.0 26.0 - 34.0 pg   MCHC 30.4 30.0 - 36.0 g/dL   RDW 17.3 (H) 11.5 - 15.5 %   Platelets 125 (L) 150 - 400 K/uL    Comment: REPEATED TO VERIFY SPECIMEN CHECKED FOR CLOTS    Neutrophils Relative % 85 %   Neutro Abs 6.6 1.7 - 7.7 K/uL   Lymphocytes Relative 8 %   Lymphs Abs 0.6 (L) 0.7 - 4.0 K/uL   Monocytes Relative 6 %   Monocytes Absolute 0.5 0.1 - 1.0 K/uL   Eosinophils Relative 1 %   Eosinophils Absolute 0.1 0.0 - 0.7 K/uL   Basophils Relative 0 %   Basophils Absolute 0.0 0.0 - 0.1 K/uL   RBC Morphology ELLIPTOCYTES     Comment: STOMATOCYTES   Smear Review PLATELETS APPEAR ADEQUATE   MRSA PCR Screening     Status: None   Collection Time: 02/15/16  4:33 AM  Result Value Ref Range   MRSA by PCR NEGATIVE NEGATIVE    Comment:        The GeneXpert MRSA Assay (FDA approved for NASAL specimens only), is one component of a comprehensive MRSA colonization surveillance program. It is not intended to diagnose MRSA infection nor to guide or monitor treatment for MRSA infections.   CBC     Status: Abnormal   Collection Time: 02/15/16  5:29 AM  Result Value Ref Range   WBC 5.5 4.0 - 10.5 K/uL   RBC 4.51 3.87 - 5.11 MIL/uL    Hemoglobin 12.5  12.0 - 15.0 g/dL   HCT 41.8 36.0 - 46.0 %   MCV 92.7 78.0 - 100.0 fL   MCH 27.7 26.0 - 34.0 pg   MCHC 29.9 (L) 30.0 - 36.0 g/dL   RDW 17.3 (H) 11.5 - 15.5 %   Platelets 144 (L) 150 - 400 K/uL  Comprehensive metabolic panel     Status: Abnormal   Collection Time: 02/15/16  5:29 AM  Result Value Ref Range   Sodium 136 135 - 145 mmol/L   Potassium 4.8 3.5 - 5.1 mmol/L   Chloride 97 (L) 101 - 111 mmol/L   CO2 28 22 - 32 mmol/L   Glucose, Bld 92 65 - 99 mg/dL   BUN 55 (H) 6 - 20 mg/dL   Creatinine, Ser 4.23 (H) 0.44 - 1.00 mg/dL   Calcium 7.9 (L) 8.9 - 10.3 mg/dL   Total Protein 5.8 (L) 6.5 - 8.1 g/dL   Albumin 3.1 (L) 3.5 - 5.0 g/dL   AST 16 15 - 41 U/L   ALT 12 (L) 14 - 54 U/L   Alkaline Phosphatase 37 (L) 38 - 126 U/L   Total Bilirubin 0.4 0.3 - 1.2 mg/dL   GFR calc non Af Amer 10 (L) >60 mL/min   GFR calc Af Amer 12 (L) >60 mL/min    Comment: (NOTE) The eGFR has been calculated using the CKD EPI equation. This calculation has not been validated in all clinical situations. eGFR's persistently <60 mL/min signify possible Chronic Kidney Disease.    Anion gap 11 5 - 15  Glucose, capillary     Status: None   Collection Time: 02/15/16  9:08 AM  Result Value Ref Range   Glucose-Capillary 80 65 - 99 mg/dL   Comment 1 Document in Chart     Ct Abdomen Pelvis W Contrast  02/15/2016  CLINICAL DATA:  65 year old female with acute abdominal pain. End-stage renal disease on dialysis. EXAM: CT ABDOMEN AND PELVIS WITH CONTRAST TECHNIQUE: Multidetector CT imaging of the abdomen and pelvis was performed using the standard protocol following bolus administration of intravenous contrast. CONTRAST:  123m ISOVUE-300 IOPAMIDOL (ISOVUE-300) INJECTION 61% COMPARISON:  Abdominal radiograph dated 02/14/2016 and CT dated 07/12/2013 FINDINGS: Partially visualized small loculated appearing right pleural effusion. Bibasilar subsegmental consolidative changes may represent atelectasis  versus infiltrate. There is diffuse interstitial prominence and ground-glass density of the visualized lung bases likely a degree of edema. Top-normal cardiac size. No intra-abdominal free air. There is diffuse mesenteric stranding with small ascites. Slight irregular appearing hepatic contour concerning for underlying cirrhosis. Clinical correlation is recommended. Cholecystectomy with mild biliary ductal dilatation. The pancreas, spleen, and the right adrenal gland appear unremarkable. There is a stable 1.8 x 2.4 cm indeterminate left adrenal nodule. There is atrophic kidneys bilaterally. Subcentimeter right renal hypodense lesion is too small to characterize but likely represent cysts. There is no hydronephrosis on either side. The visualized ureters appear unremarkable. The urinary bladder is collapsed. There is apparent diffuse thickening of the bladder wall which may be partly related to underdistention. Cystitis is not excluded. Correlation with urinalysis recommended. The uterus is grossly unremarkable. Evaluation of the bowel is limited due to suboptimal opacification. Small amount of contrast noted mixed with gastric content within the stomach. This postsurgical changes of bowel with anastomotic sutures in the upper abdomen. There is focal area of mesentery swirling in the mid abdomen (series 2, image 48) with segmental collapse of the adjacent small bowel. There is mild dilatation of the loops of small  bowel proximal to this area measuring up to 3.7 cm in diameter. Findings compatible with small bowel obstruction with transition zone in the mid abdomen, likely related to underlying adhesions. No pneumatosis. There is extensive sigmoid diverticulosis. Moderate stool noted throughout the colon. The appendix is not visualized with certainty. No inflammatory changes identified in the right lower quadrant. There is advanced aortoiliac atherosclerotic disease. There is a 2.6 cm infrarenal abdominal aortic  ectasia. There is mild dilatation of the main portal vein may represent a degree of portal vein hypertension. The SMV, splenic vein, and main portal vein are otherwise patent. No portal venous gas identified. There is no adenopathy. There is diffuse subcutaneous soft tissue stranding and edema. There is osteopenia with degenerative changes of the spine. No acute fracture. IMPRESSION: Small-bowel obstruction with transition zone in the mid abdomen, likely related to underlying adhesions. Constipation with extensive colonic diverticulosis. Mild irregularity of the hepatic contour. Clinical correlation is recommended to evaluate for underlying cirrhosis. Atrophic kidneys compatible with known chronic renal failure. Small ascites, diffuse mesenteric edema, and anasarca. Small right pleural effusion with bibasilar subsegmental consolidative changes. Electronically Signed   By: Anner Crete M.D.   On: 02/15/2016 02:10   Dg Abd 2 Views  02/15/2016  CLINICAL DATA:  65 year old female with acute abdominal pain EXAM: ABDOMEN - 2 VIEW COMPARISON:  Abdominal CT dated 07/12/2013 FINDINGS: Multiple mildly dilated air-filled loops of small bowel in the abdomen measuring up to 3.5 cm and may represent small bowel obstruction versus ileus. There is moderate stool throughout the colon. There is no free air. Right upper quadrant cholecystectomy clips. Small radiopaque densities over the right renal silhouette may be related to costochondral calcification or represent small renal calculi. There is advanced aortoiliac atherosclerotic disease. Osteopenia with degenerative changes of spine. Trace bilateral pleural effusions. Right lateral pleural thickening/scarring. IMPRESSION: Mild dilatation of the small bowel, likely obstruction versus ileus. Clinical correlation and follow-up recommended. Electronically Signed   By: Anner Crete M.D.   On: 02/15/2016 00:32    Review of Systems  Constitutional: Negative for fever and  chills.  Respiratory: Negative for cough and sputum production.   Cardiovascular: Negative for palpitations and orthopnea.  Gastrointestinal: Positive for nausea, vomiting and abdominal pain. Negative for diarrhea and blood in stool.   Blood pressure 118/42, pulse 73, temperature 98.2 F (36.8 C), temperature source Oral, resp. rate 20, weight 58.423 kg (128 lb 12.8 oz), SpO2 98 %. Physical Exam  Constitutional: She is oriented to person, place, and time.  Patient is on oxygen  Eyes: No scleral icterus.  Neck: JVD present.  Cardiovascular: Intact distal pulses.   Murmur heard. Respiratory: She has wheezes.  GI: She exhibits no distension. There is no tenderness.  Musculoskeletal: She exhibits no edema.  Neurological: She is alert and oriented to person, place, and time.    Assessment/Plan: Problem #1 end-stage renal disease: She is status post hemodialysis on Wednesday. Her potassium is normal. Problem #2 small bowel obstruction/ileus. Presently her abdominal pain is better but still she has some nausea. Problem #3 a trial fibrillation Problem #4 history of hypertension: Her blood pressure is reasonably controlled Problem #5 history of diabetes Problem #6 fluid management: Patient has some difficulty breathing. She has been on oxygen for some time. Problem #7 metabolic bone disease: Her calcium is in range Problem #8 anemia: Her hemoglobin is above our target goal. Patient is off Epogen. Plan: We'll dialyze patient today We'll try to remove about 2-1/2 L if blood  pressure tolerates We'll DC IV fluid We'll check her renal panel in the morning.  Regnia Mathwig S 02/15/2016, 10:06 AM

## 2016-02-15 NOTE — Procedures (Signed)
   HEMODIALYSIS TREATMENT NOTE:  3.25 hour heparin-free dialysis completed via left upper arm AVF (15g buttonhole ante/retrograde). Goal met: 2.5 liters removed without interruption in ultrafiltration. All blood was returned and hemostasis was achieved within 15 minutes. Report called to Sharen Hones, RN.  Rockwell [place], RN, CDN

## 2016-02-15 NOTE — ED Notes (Signed)
MD at bedside. 

## 2016-02-15 NOTE — Progress Notes (Signed)
Initial Nutrition Assessment  DOCUMENTATION CODES:  Severe malnutrition in context of chronic illness   Pt meets criteria for SEVERE MALNUTRITION in the context of Chronic Illness as evidenced by Loss of >10% bw in 6 months and an oral intake that is estimated to have met < or equal to 75% of needs for > or equal to 1 month.  INTERVENTION:  Nepro Shake po BID, each supplement provides 425 kcal and 19 grams protein  NUTRITION DIAGNOSIS:  Increased nutrient needs related to multitude of catabolic chronic disease states including ESRD on HD, COPD on O2,Potential Cirrhosis as evidenced by apparent loss of  15% bw in 6 months  GOAL:  Patient will meet greater than or equal to 90% of their needs  MONITOR:  PO intake, Supplement acceptance, Diet advancement, Labs, Weight trends  REASON FOR ASSESSMENT:  Malnutrition Screening Tool    ASSESSMENT:  65 y/o female PMHx ESRD on HD, Diverticulosis, A fib, COPD, HTN, CHF, GERD, CAD, DM, thyroid cancer.  Presents with abdominal pain and 1 episode vomiting. Workup reveals SBO, likely from adhesions.    Pt has now been placed on CL diet. She had some jello while RD was there, which was her first intake since admitted. She wondered how quickly her diet order would progress and if she would get a normal breakfast. Explained she will likely need to tolerate a meal or two of CL, then FL before she is placed on a regular texture diet.   Pt says that at home she will eat 2 small meals and a drink a nepro daily. She has a very poor appetite overall and says she was started on megace ~1 week ago. She says this has helped her appetite a lot and she is now "eating like a horse". She takes a renal mvi at home. She does not follow a strict renal diet and will eat what she wants, but does  try to not over do it on the high na+, k+, phos foods.   She says she weighed 275 3 years ago. She could not give one specific reason for wt loss, though believes her recent  thyroid issues played a large part.   RD explained she has extremely high Kcal/Pro needs due to her multiple chronic illnesses including copd, esrd, and potentially cirrhosis. These all stress the body significantly and she likely needs upwards of 2000 kcals a day to maintain weight. RD advised her to increase her supplement intake at home if able. She was agreeable to this.   NFPE: Pt was undergoing dialysis and was covered. Did not attempt to do full exam, but atleast has mild-mod upper body muscle wasting.   Her documented wt history shows a significant loss of ~15% bw in the last 6 months. She weighed ~150 in January of this year. Her weight loss goes back years. She weighed ~250 lbs in mid 2014. This would indicate a wt loss of nearly 50% of her BW in 3 years.   Labs reviewed: Albumin: 3.1, Total Pro:5.8, A1C pending  Meds: Insulin.    Recent Labs Lab 02/14/16 2349 02/15/16 0529  NA 140 136  K 4.0 4.8  CL 96* 97*  CO2 28 28  BUN 54* 55*  CREATININE 4.29* 4.23*  CALCIUM 8.3* 7.9*  GLUCOSE 125* 92   Diet Order:  Diet clear liquid Room service appropriate?: Yes; Fluid consistency:: Thin  Skin:  Jaundiced  Last BM:  6/29  Height:  Ht Readings from Last 1 Encounters:  12/31/15 '5\' 6"'$  (1.676 m)   Weight:  Wt Readings from Last 1 Encounters:  02/15/16 129 lb 6.6 oz (58.7 kg)   Wt Readings from Last 10 Encounters:  02/15/16 129 lb 6.6 oz (58.7 kg)  12/31/15 132 lb 4.4 oz (60 kg)  10/24/15 136 lb 0.4 oz (61.7 kg)  09/27/15 145 lb (65.772 kg)  09/03/15 160 lb 15 oz (73 kg)  03/13/15 150 lb (68.04 kg)  10/20/14 153 lb (69.4 kg)  10/12/14 155 lb 14.4 oz (70.716 kg)  09/05/14 152 lb (68.947 kg)  03/02/14 162 lb (73.483 kg)   Ideal Body Weight:  59.09 kg  BMI:  Body mass index is 20.9 kg/(m^2).  Estimated Nutritional Needs:  Kcal:  2000-2200 (34-38 kcal/kg bw) Protein:  81-93 g (1.4-1.6 g/kg bw) Fluid:  Per MD  EDUCATION NEEDS:  No education needs identified at  this time  Burtis Junes RD, LDN, [place] Clinical Nutrition Pager: [phone] 02/15/2016 4:36 PM

## 2016-02-15 NOTE — ED Notes (Signed)
Pt refused ng tube placement at this time.  Explained procedure to pt and how it is needed for her bowel obstruction.  Pt request the procedure be put on hold for a little while.

## 2016-02-16 DIAGNOSIS — I480 Paroxysmal atrial fibrillation: Secondary | ICD-10-CM

## 2016-02-16 LAB — RENAL FUNCTION PANEL
ALBUMIN: 3.2 g/dL — AB (ref 3.5–5.0)
ANION GAP: 8 (ref 5–15)
BUN: 33 mg/dL — AB (ref 6–20)
CALCIUM: 7.8 mg/dL — AB (ref 8.9–10.3)
CHLORIDE: 95 mmol/L — AB (ref 101–111)
CO2: 30 mmol/L (ref 22–32)
CREATININE: 3.09 mg/dL — AB (ref 0.44–1.00)
GFR calc Af Amer: 17 mL/min — ABNORMAL LOW (ref >60)
GFR, EST NON AFRICAN AMERICAN: 15 mL/min — AB (ref >60)
Glucose, Bld: 83 mg/dL (ref 65–99)
POTASSIUM: 4.9 mmol/L (ref 3.5–5.1)
Phosphorus: 6.6 mg/dL — ABNORMAL HIGH (ref 2.5–4.6)
Sodium: 133 mmol/L — ABNORMAL LOW (ref 135–145)

## 2016-02-16 LAB — CBC
HEMATOCRIT: 43 % (ref 36.0–46.0)
HEMOGLOBIN: 12.8 g/dL (ref 12.0–15.0)
MCH: 27.5 pg (ref 26.0–34.0)
MCHC: 29.8 g/dL — AB (ref 30.0–36.0)
MCV: 92.3 fL (ref 78.0–100.0)
Platelets: 135 10*3/uL — ABNORMAL LOW (ref 150–400)
RBC: 4.66 MIL/uL (ref 3.87–5.11)
RDW: 16.9 % — AB (ref 11.5–15.5)
WBC: 4.5 10*3/uL (ref 4.0–10.5)

## 2016-02-16 LAB — HEMOGLOBIN A1C
Hgb A1c MFr Bld: 4.3 % — ABNORMAL LOW (ref 4.8–5.6)
Mean Plasma Glucose: 77 mg/dL

## 2016-02-16 LAB — GLUCOSE, CAPILLARY
GLUCOSE-CAPILLARY: 93 mg/dL (ref 65–99)
Glucose-Capillary: 144 mg/dL — ABNORMAL HIGH (ref 65–99)

## 2016-02-16 MED ORDER — SEVELAMER CARBONATE 800 MG PO TABS: 800.0000 mg | ORAL_TABLET | Freq: Every day | ORAL | Status: DC

## 2016-02-16 MED ORDER — SEVELAMER CARBONATE 800 MG PO TABS: 3200.0000 mg | ORAL_TABLET | Freq: Three times a day (TID) | ORAL | Status: DC

## 2016-02-16 MED ORDER — BISACODYL 10 MG RE SUPP: 10.0000 mg | Freq: Every day | RECTAL | Status: AC | PRN

## 2016-02-16 NOTE — Discharge Summary (Signed)
Physician Discharge Summary  Anita [name] CBJ:[id number] DOB: Jan 11, 1951 DOA: 02/14/2016  PCP: Robert Bellow, MD  Admit date: 02/14/2016 Discharge date: 02/16/2016  Admitted From: Home Disposition: Home   Recommendations for Outpatient Follow-up:  1. Follow up with PCP in 1-2 weeks 2. Please obtain BMP/CBC in one week 3. Follow-up at dialysis center on 7/3 as previously scheduled.    Home Health: No Equipment/Devices: None  Discharge Condition: Stable CODE STATUS: Full Diet recommendation: Heart healthy-High fiber diet   Brief/Interim Summary: 27 yof with history of ESRD on hemodialysis MWF, diverticulosis, paroxysmal atrial fibrillation anticoagulated with Eliquis and hypothyroidism presented with complaints of abdominal pain and associated vomiting x1. CT A/P in the ED revealed small bowel obstruction, likely from adhesions. She has been admitted for further evaluation.   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 Abdominal pain and vomiting have resolved. General surgery has evaluated and does not believe surgical intervention is required at this moment. Initially NPO on admission, upon discharge able to tolerate a solid diet. She was treated with bowel rest, IVF and ambulation. She was treated with Dulcolax suppositories and is to have multiple bowel movements and passing flatus She has not had any further nausea, vomiting, or abdominal pain. Patient instructed to continue on high fiber diet as outpatient as well as continue Miralax. .  1. ESR on hemodialysis MWF. HD performed 6/30. Follow up with outpatient dialysis center on  Monday as scheduled.  2. DM with ESRD. Blood sugars are stable. Continue SSI.  3. PAF anticoagulated on Eliquis. Remains rate controlled.  4. Severe malnutrition in the context of chronic illness. Nutrition followed.  Discharge Instructions      Discharge Instructions    Diet - low sodium heart healthy    Complete by:  As directed      Increase activity slowly    Complete by:  As directed             Medication List    TAKE these medications        ALPRAZolam 1 MG tablet  Commonly known as:  XANAX  Take 0.5-1 mg by mouth 4 (four) times daily. Patient takes 1/2 tablet 2-3 times daily and 1 tablet at bedtime     bisacodyl 10 MG suppository  Commonly known as:  DULCOLAX  Place 1 suppository (10 mg total) rectally daily as needed for moderate constipation.     calcium carbonate 500 MG chewable tablet  Commonly known as:  TUMS - dosed in mg elemental calcium  Chew 1 tablet by mouth 2 (two) times daily as needed for heartburn (Takes with snacks. In place of Renvela).     diltiazem 240 MG 24 hr capsule  Commonly known as:  TIAZAC  Take 240 mg by mouth daily.     ELIQUIS 5 MG Tabs tablet  Generic drug:  apixaban  Take 5 mg by mouth 2 (two) times daily.     hydrALAZINE 100 MG tablet  Commonly known as:  APRESOLINE  Take 100 mg by mouth 3 (three) times daily.     HYDROcodone-acetaminophen 7.5-325 MG tablet  Commonly known as:  NORCO  Take 1 tablet by mouth every 6 (six) hours as needed for pain.     ipratropium-albuterol 0.5-2.5 (3) MG/3ML Soln  Commonly known as:  DUONEB  Take 3 mLs by nebulization every 4 (four) hours as needed.     meclizine 25 MG tablet  Commonly known as:  ANTIVERT  Take 25 mg by mouth 4 (four) times daily as needed for dizziness.     megestrol 40 MG/ML suspension  Commonly known as:  MEGACE  Take 400 mg by mouth daily.     metoprolol tartrate 25 MG tablet  Commonly known as:  LOPRESSOR  Take 0.5 tablets (12.5 mg total) by mouth 2 (two)  times daily.     multivitamin Tabs tablet  Take 1 tablet by mouth at bedtime.     nitroGLYCERIN 0.4 MG SL tablet  Commonly known as:  NITROSTAT  Place 1 tablet (0.4 mg total) under the tongue every 5 (five) minutes as needed for chest pain.     omeprazole 20 MG capsule  Commonly known as:  PRILOSEC  Take 20 mg by mouth daily as needed (acid reflux).     ondansetron 4 MG tablet  Commonly known as:  ZOFRAN  Take 4 mg by mouth at bedtime.     polyethylene glycol packet  Commonly known as:  MIRALAX / GLYCOLAX  Take 17 g by mouth daily.     PROAIR HFA 108 (90 Base) MCG/ACT inhaler  Generic drug:  albuterol  Inhale 1-2 puffs into the lungs every 6 (six) hours as needed for wheezing or shortness of breath.     sevelamer carbonate 800 MG tablet  Commonly known as:  RENVELA  Take 1,600-2,400 mg by mouth 3 (three) times daily with meals. 3 with meals and 2 with snacks     SPIRIVA HANDIHALER 18 MCG inhalation capsule  Generic drug:  tiotropium  Place 18 mcg into inhaler and inhale daily.     Thyroid 97.5 MG Tabs  Take 195 mg by mouth daily.     VITAMIN B 12 PO  Take 1,000 mg by mouth daily.     zolpidem 10 MG tablet  Commonly known as:  AMBIEN  Take 10 mg by mouth at bedtime as needed for sleep.        Allergies  Allergen Reactions  . Doxycycline Nausea And Vomiting  . Lipitor [Atorvastatin] Nausea And Vomiting    Consultations:  General surgery   Nephrology    Procedures/Studies: Ct Abdomen Pelvis W Contrast  02/15/2016  CLINICAL DATA:  65 year old female with acute abdominal pain. End-stage renal disease on dialysis. EXAM: CT ABDOMEN AND PELVIS WITH CONTRAST TECHNIQUE: Multidetector CT imaging of the abdomen and pelvis was performed using the standard protocol following bolus administration of intravenous contrast. CONTRAST:  151m ISOVUE-300 IOPAMIDOL (ISOVUE-300) INJECTION 61% COMPARISON:  Abdominal radiograph dated 02/14/2016 and CT dated 07/12/2013 FINDINGS:  Partially visualized small loculated appearing right pleural effusion. Bibasilar subsegmental consolidative changes may represent atelectasis versus infiltrate. There is diffuse interstitial prominence and ground-glass density of the visualized lung bases likely a degree of edema. Top-normal cardiac size. No intra-abdominal free air. There is diffuse mesenteric stranding with small ascites. Slight irregular appearing hepatic contour concerning for underlying cirrhosis. Clinical correlation is recommended. Cholecystectomy with mild biliary ductal dilatation. The pancreas, spleen, and the right adrenal gland appear unremarkable. There is a stable 1.8 x 2.4 cm indeterminate left adrenal nodule. There is atrophic kidneys bilaterally. Subcentimeter right renal hypodense lesion is too small to  characterize but likely represent cysts. There is no hydronephrosis on either side. The visualized ureters appear unremarkable. The urinary bladder is collapsed. There is apparent diffuse thickening of the bladder wall which may be partly related to underdistention. Cystitis is not excluded. Correlation with urinalysis recommended. The uterus is grossly unremarkable. Evaluation of the bowel is limited due to suboptimal opacification. Small amount of contrast noted mixed with gastric content within the stomach. This postsurgical changes of bowel with anastomotic sutures in the upper abdomen. There is focal area of mesentery swirling in the mid abdomen (series 2, image 48) with segmental collapse of the adjacent small bowel. There is mild dilatation of the loops of small bowel proximal to this area measuring up to 3.7 cm in diameter. Findings compatible with small bowel obstruction with transition zone in the mid abdomen, likely related to underlying adhesions. No pneumatosis. There is extensive sigmoid diverticulosis. Moderate stool noted throughout the colon. The appendix is not visualized with certainty. No inflammatory changes  identified in the right lower quadrant. There is advanced aortoiliac atherosclerotic disease. There is a 2.6 cm infrarenal abdominal aortic ectasia. There is mild dilatation of the main portal vein may represent a degree of portal vein hypertension. The SMV, splenic vein, and main portal vein are otherwise patent. No portal venous gas identified. There is no adenopathy. There is diffuse subcutaneous soft tissue stranding and edema. There is osteopenia with degenerative changes of the spine. No acute fracture. IMPRESSION: Small-bowel obstruction with transition zone in the mid abdomen, likely related to underlying adhesions. Constipation with extensive colonic diverticulosis. Mild irregularity of the hepatic contour. Clinical correlation is recommended to evaluate for underlying cirrhosis. Atrophic kidneys compatible with known chronic renal failure. Small ascites, diffuse mesenteric edema, and anasarca. Small right pleural effusion with bibasilar subsegmental consolidative changes. Electronically Signed   By: Anner Crete M.D.   On: 02/15/2016 02:10   Dg Abd 2 Views  02/15/2016  CLINICAL DATA:  65 year old female with acute abdominal pain EXAM: ABDOMEN - 2 VIEW COMPARISON:  Abdominal CT dated 07/12/2013 FINDINGS: Multiple mildly dilated air-filled loops of small bowel in the abdomen measuring up to 3.5 cm and may represent small bowel obstruction versus ileus. There is moderate stool throughout the colon. There is no free air. Right upper quadrant cholecystectomy clips. Small radiopaque densities over the right renal silhouette may be related to costochondral calcification or represent small renal calculi. There is advanced aortoiliac atherosclerotic disease. Osteopenia with degenerative changes of spine. Trace bilateral pleural effusions. Right lateral pleural thickening/scarring. IMPRESSION: Mild dilatation of the small bowel, likely obstruction versus ileus. Clinical correlation and follow-up recommended.  Electronically Signed   By: Anner Crete M.D.   On: 02/15/2016 00:32   HD 6/30   Subjective: Feeling pretty good. Able to tolerate liquid diet and solid diet. Multiple bowel movements and abdominal pain has resolved.    Discharge Exam: Filed Vitals:   02/15/16 1743 02/16/16 0621  BP: 139/72 142/58  Pulse: 83 72  Temp: 98.5 F (36.9 C) 98.9 F (37.2 C)  Resp: 18 18   Filed Vitals:   02/15/16 1700 02/15/16 1743 02/16/16 0621 02/16/16 0852  BP: 170/81 139/72 142/58   Pulse: 63 83 72   Temp:  98.5 F (36.9 C) 98.9 F (37.2 C)   TempSrc:  Oral Oral   Resp:  18 18   Weight:      SpO2:  92% 98% 97%    General: Pt is alert, awake, not in acute distress Cardiovascular:  RRR, S1/S2 +, no rubs, no gallops Respiratory: CTA bilaterally, no wheezing, no rhonchi Abdominal: Soft, NT, ND, bowel sounds + Extremities: no edema, no cyanosis    The results of significant diagnostics from this hospitalization (including imaging, microbiology, ancillary and laboratory) are listed below for reference.     Microbiology: Recent Results (from the past 240 hour(s))  MRSA PCR Screening     Status: None   Collection Time: 02/15/16  4:33 AM  Result Value Ref Range Status   MRSA by PCR NEGATIVE NEGATIVE Final    Comment:        The GeneXpert MRSA Assay (FDA approved for NASAL specimens only), is one component of a comprehensive MRSA colonization surveillance program. It is not intended to diagnose MRSA infection nor to guide or monitor treatment for MRSA infections.      Labs: BNP (last 3 results)  Recent Labs  10/22/15 1244 12/30/15 2102  BNP 2971.2* 7867.6*   Basic Metabolic Panel:  Recent Labs Lab 02/14/16 2349 02/15/16 0529 02/16/16 0645  NA 140 136 133*  K 4.0 4.8 4.9  CL 96* 97* 95*  CO2 '28 28 30  '$ GLUCOSE 125* 92 83  BUN 54* 55* 33*  CREATININE 4.29* 4.23* 3.09*  CALCIUM 8.3* 7.9* 7.8*  PHOS  --   --  6.6*   Liver Function Tests:  Recent Labs Lab  02/14/16 2349 02/15/16 0529 02/16/16 0645  AST 19 16  --   ALT 13* 12*  --   ALKPHOS 47 37*  --   BILITOT 0.6 0.4  --   PROT 7.0 5.8*  --   ALBUMIN 3.9 3.1* 3.2*   No results for input(s): LIPASE, AMYLASE in the last 168 hours. No results for input(s): AMMONIA in the last 168 hours. CBC:  Recent Labs Lab 02/14/16 2349 02/15/16 0529 02/16/16 0645  WBC 7.8 5.5 4.5  NEUTROABS 6.6  --   --   HGB 14.3 12.5 12.8  HCT 47.0* 41.8 43.0  MCV 92.0 92.7 92.3  PLT 125* 144* 135*   Cardiac Enzymes: No results for input(s): CKTOTAL, CKMB, CKMBINDEX, TROPONINI in the last 168 hours. BNP: Invalid input(s): POCBNP CBG:  Recent Labs Lab 02/15/16 0908 02/15/16 1139 02/15/16 1737 02/15/16 2214 02/16/16 0744  GLUCAP 80 80 77 74 144*   D-Dimer No results for input(s): DDIMER in the last 72 hours. Hgb A1c  Recent Labs  02/15/16 0529  HGBA1C 4.3*   Urinalysis    Component Value Date/Time   COLORURINE YELLOW 10/22/2015 1602   APPEARANCEUR CLOUDY* 10/22/2015 1602   LABSPEC 1.017 10/22/2015 1602   PHURINE 6.5 10/22/2015 1602   GLUCOSEU 100* 10/22/2015 1602   HGBUR MODERATE* 10/22/2015 1602   BILIRUBINUR NEGATIVE 10/22/2015 1602   KETONESUR NEGATIVE 10/22/2015 1602   PROTEINUR >300* 10/22/2015 1602   NITRITE NEGATIVE 10/22/2015 1602   LEUKOCYTESUR NEGATIVE 10/22/2015 1602   Sepsis Labs Invalid input(s): PROCALCITONIN,  WBC,  LACTICIDVEN Microbiology Recent Results (from the past 240 hour(s))  MRSA PCR Screening     Status: None   Collection Time: 02/15/16  4:33 AM  Result Value Ref Range Status   MRSA by PCR NEGATIVE NEGATIVE Final    Comment:        The GeneXpert MRSA Assay (FDA approved for NASAL specimens only), is one component of a comprehensive MRSA colonization surveillance program. It is not intended to diagnose MRSA infection nor to guide or monitor treatment for MRSA infections.      Time coordinating discharge: Over  30 minutes  SIGNED:  Kathie Dike, MD [facility] 02/16/2016, 10:17 AM If 7PM-7AM, please contact night-coverage [email or web address] Password [email or web address]   By signing my name below, I, Deneise Lever tus, attest that this documentation has been prepared under the direction and in the presence of Kathie Dike, MD. Electronically signed: Rennis Harding, Scribe. 02/16/2016 9:58am   I, Dr. Kathie Dike, personally performed the services described in this documentaiton. All medical record entries made by the scribe were at my direction and in my presence. I have reviewed the chart and agree that the record reflects my personal performance and is accurate and complete  Kathie Dike, MD, 02/16/2016 10:17 AM

## 2016-02-16 NOTE — Progress Notes (Signed)
Patient discharged with instructions, prescription, and care notes.  Verbalized understanding via teach back.  IV was removed and the site was WNL. Patient voiced no further complaints or concerns at the time of discharge.  Appointments scheduled per instructions.  Patient left the floor via w/c with staff and family in stable condition.   Asked MD due to the patient was ask about sending her home with a pain med prescription.  MD says the patient should f/u with her PCP.

## 2016-02-16 NOTE — Progress Notes (Signed)
Subjective: Interval History: has no complaint of nausea or vomiting. Patient had bowel movement this morning. She denies also any abdominal pain. Overall feeling better..  Objective: Vital signs in last 24 hours: Temp:  [98.2 F (36.8 C)-98.9 F (37.2 C)] 98.9 F (37.2 C) (07/01 0621) Pulse Rate:  [63-83] 72 (07/01 0621) Resp:  [16-20] 18 (07/01 0621) BP: (138-172)/(58-83) 142/58 mmHg (07/01 0621) SpO2:  [92 %-98 %] 97 % (07/01 0852) Weight:  [58.7 kg (129 lb 6.6 oz)] 58.7 kg (129 lb 6.6 oz) (06/30 1400) Weight change: 0.277 kg (9.8 oz)  Intake/Output from previous day: 06/30 0701 - 07/01 0700 In: 240 [P.O.:240] Out: 2500  Intake/Output this shift:    General appearance: alert, cooperative and no distress Resp: clear to auscultation bilaterally Cardio: irregularly irregular rhythm GI: Soft, nontender and positive bowel sound Extremities: No edema  Lab Results:  Recent Labs  02/15/16 0529 02/16/16 0645  WBC 5.5 4.5  HGB 12.5 12.8  HCT 41.8 43.0  PLT 144* 135*   BMET:  Recent Labs  02/15/16 0529 02/16/16 0645  NA 136 133*  K 4.8 4.9  CL 97* 95*  CO2 28 30  GLUCOSE 92 83  BUN 55* 33*  CREATININE 4.23* 3.09*  CALCIUM 7.9* 7.8*   No results for input(s): PTH in the last 72 hours. Iron Studies: No results for input(s): IRON, TIBC, TRANSFERRIN, FERRITIN in the last 72 hours.  Studies/Results: Ct Abdomen Pelvis W Contrast  02/15/2016  CLINICAL DATA:  65 year old female with acute abdominal pain. End-stage renal disease on dialysis. EXAM: CT ABDOMEN AND PELVIS WITH CONTRAST TECHNIQUE: Multidetector CT imaging of the abdomen and pelvis was performed using the standard protocol following bolus administration of intravenous contrast. CONTRAST:  148mL ISOVUE-300 IOPAMIDOL (ISOVUE-300) INJECTION 61% COMPARISON:  Abdominal radiograph dated 02/14/2016 and CT dated 07/12/2013 FINDINGS: Partially visualized small loculated appearing right pleural effusion. Bibasilar  subsegmental consolidative changes may represent atelectasis versus infiltrate. There is diffuse interstitial prominence and ground-glass density of the visualized lung bases likely a degree of edema. Top-normal cardiac size. No intra-abdominal free air. There is diffuse mesenteric stranding with small ascites. Slight irregular appearing hepatic contour concerning for underlying cirrhosis. Clinical correlation is recommended. Cholecystectomy with mild biliary ductal dilatation. The pancreas, spleen, and the right adrenal gland appear unremarkable. There is a stable 1.8 x 2.4 cm indeterminate left adrenal nodule. There is atrophic kidneys bilaterally. Subcentimeter right renal hypodense lesion is too small to characterize but likely represent cysts. There is no hydronephrosis on either side. The visualized ureters appear unremarkable. The urinary bladder is collapsed. There is apparent diffuse thickening of the bladder wall which may be partly related to underdistention. Cystitis is not excluded. Correlation with urinalysis recommended. The uterus is grossly unremarkable. Evaluation of the bowel is limited due to suboptimal opacification. Small amount of contrast noted mixed with gastric content within the stomach. This postsurgical changes of bowel with anastomotic sutures in the upper abdomen. There is focal area of mesentery swirling in the mid abdomen (series 2, image 48) with segmental collapse of the adjacent small bowel. There is mild dilatation of the loops of small bowel proximal to this area measuring up to 3.7 cm in diameter. Findings compatible with small bowel obstruction with transition zone in the mid abdomen, likely related to underlying adhesions. No pneumatosis. There is extensive sigmoid diverticulosis. Moderate stool noted throughout the colon. The appendix is not visualized with certainty. No inflammatory changes identified in the right lower quadrant. There is advanced aortoiliac  atherosclerotic  disease. There is a 2.6 cm infrarenal abdominal aortic ectasia. There is mild dilatation of the main portal vein may represent a degree of portal vein hypertension. The SMV, splenic vein, and main portal vein are otherwise patent. No portal venous gas identified. There is no adenopathy. There is diffuse subcutaneous soft tissue stranding and edema. There is osteopenia with degenerative changes of the spine. No acute fracture. IMPRESSION: Small-bowel obstruction with transition zone in the mid abdomen, likely related to underlying adhesions. Constipation with extensive colonic diverticulosis. Mild irregularity of the hepatic contour. Clinical correlation is recommended to evaluate for underlying cirrhosis. Atrophic kidneys compatible with known chronic renal failure. Small ascites, diffuse mesenteric edema, and anasarca. Small right pleural effusion with bibasilar subsegmental consolidative changes. Electronically Signed   By: Anner Crete M.D.   On: 02/15/2016 02:10   Dg Abd 2 Views  02/15/2016  CLINICAL DATA:  65 year old female with acute abdominal pain EXAM: ABDOMEN - 2 VIEW COMPARISON:  Abdominal CT dated 07/12/2013 FINDINGS: Multiple mildly dilated air-filled loops of small bowel in the abdomen measuring up to 3.5 cm and may represent small bowel obstruction versus ileus. There is moderate stool throughout the colon. There is no free air. Right upper quadrant cholecystectomy clips. Small radiopaque densities over the right renal silhouette may be related to costochondral calcification or represent small renal calculi. There is advanced aortoiliac atherosclerotic disease. Osteopenia with degenerative changes of spine. Trace bilateral pleural effusions. Right lateral pleural thickening/scarring. IMPRESSION: Mild dilatation of the small bowel, likely obstruction versus ileus. Clinical correlation and follow-up recommended. Electronically Signed   By: Anner Crete M.D.   On: 02/15/2016 00:32    I have  reviewed the patient's current medications.  Assessment/Plan: Problem #1 small bowel obstruction. Presently patient denies any nausea or vomiting. She has also found moment. She seemed to be tolerating clear liquid diet. Presently she is asking for her binders. Problem #2 end-stage renal disease: She is status post hemodialysis yesterday. Her potassium is normal Problem #3 history of atrial fibrillation Problem #4 diabetes Problem #5 metabolic bone disease: Calcium is range but her phosphorus is slightly high. Patient is not taking her binder because of small bowel obstruction. Problem #6 history of anemia: Her hemoglobin is above GOAL and not on Epogen Problem# 7 history of aortic stenosis  Plan: We'll start patient on Renvela 800 mg 3 tablets by mouth 3 times a day with meals and one with snack 2] patient at this moment doesn't require dialysis and her next dialysis will be on Monday which is her regular schedule. Patient can be dialyzed as an outpatient if she is discharged.   LOS: 1 day   Rafael Salway S 02/16/2016,8:52 AM

## 2016-04-03 ENCOUNTER — Other Ambulatory Visit: Payer: Self-pay | Admitting: Cardiology

## 2016-04-07 ENCOUNTER — Emergency Department (HOSPITAL_COMMUNITY): Payer: Medicare Other

## 2016-04-07 ENCOUNTER — Inpatient Hospital Stay (HOSPITAL_COMMUNITY)
Admission: EM | Admit: 2016-04-07 | Discharge: 2016-04-09 | DRG: 190 | Disposition: A | Discharge status: Home Health | Payer: Medicare Other | Attending: Internal Medicine | Admitting: Internal Medicine

## 2016-04-07 ENCOUNTER — Encounter (HOSPITAL_COMMUNITY): Payer: Self-pay

## 2016-04-07 DIAGNOSIS — I132 Hypertensive heart and chronic kidney disease with heart failure and with stage 5 chronic kidney disease, or end stage renal disease: Secondary | ICD-10-CM | POA: Diagnosis present

## 2016-04-07 DIAGNOSIS — I350 Nonrheumatic aortic (valve) stenosis: Secondary | ICD-10-CM

## 2016-04-07 DIAGNOSIS — Z7901 Long term (current) use of anticoagulants: Secondary | ICD-10-CM

## 2016-04-07 DIAGNOSIS — D631 Anemia in chronic kidney disease: Secondary | ICD-10-CM | POA: Diagnosis present

## 2016-04-07 DIAGNOSIS — Z8249 Family history of ischemic heart disease and other diseases of the circulatory system: Secondary | ICD-10-CM

## 2016-04-07 DIAGNOSIS — R778 Other specified abnormalities of plasma proteins: Secondary | ICD-10-CM | POA: Diagnosis present

## 2016-04-07 DIAGNOSIS — N186 End stage renal disease: Secondary | ICD-10-CM | POA: Diagnosis present

## 2016-04-07 DIAGNOSIS — Z811 Family history of alcohol abuse and dependence: Secondary | ICD-10-CM

## 2016-04-07 DIAGNOSIS — Z9981 Dependence on supplemental oxygen: Secondary | ICD-10-CM

## 2016-04-07 DIAGNOSIS — E1140 Type 2 diabetes mellitus with diabetic neuropathy, unspecified: Secondary | ICD-10-CM | POA: Diagnosis present

## 2016-04-07 DIAGNOSIS — R7989 Other specified abnormal findings of blood chemistry: Secondary | ICD-10-CM | POA: Diagnosis not present

## 2016-04-07 DIAGNOSIS — I480 Paroxysmal atrial fibrillation: Secondary | ICD-10-CM | POA: Diagnosis present

## 2016-04-07 DIAGNOSIS — Z820 Family history of epilepsy and other diseases of the nervous system: Secondary | ICD-10-CM

## 2016-04-07 DIAGNOSIS — Z825 Family history of asthma and other chronic lower respiratory diseases: Secondary | ICD-10-CM

## 2016-04-07 DIAGNOSIS — D638 Anemia in other chronic diseases classified elsewhere: Secondary | ICD-10-CM | POA: Diagnosis present

## 2016-04-07 DIAGNOSIS — E1122 Type 2 diabetes mellitus with diabetic chronic kidney disease: Secondary | ICD-10-CM | POA: Diagnosis present

## 2016-04-07 DIAGNOSIS — E785 Hyperlipidemia, unspecified: Secondary | ICD-10-CM | POA: Diagnosis present

## 2016-04-07 DIAGNOSIS — R531 Weakness: Secondary | ICD-10-CM | POA: Diagnosis not present

## 2016-04-07 DIAGNOSIS — G4730 Sleep apnea, unspecified: Secondary | ICD-10-CM | POA: Diagnosis present

## 2016-04-07 DIAGNOSIS — F17210 Nicotine dependence, cigarettes, uncomplicated: Secondary | ICD-10-CM | POA: Diagnosis present

## 2016-04-07 DIAGNOSIS — I5033 Acute on chronic diastolic (congestive) heart failure: Secondary | ICD-10-CM | POA: Diagnosis present

## 2016-04-07 DIAGNOSIS — E1121 Type 2 diabetes mellitus with diabetic nephropathy: Secondary | ICD-10-CM | POA: Diagnosis present

## 2016-04-07 DIAGNOSIS — Y95 Nosocomial condition: Secondary | ICD-10-CM | POA: Diagnosis present

## 2016-04-07 DIAGNOSIS — Z818 Family history of other mental and behavioral disorders: Secondary | ICD-10-CM

## 2016-04-07 DIAGNOSIS — E039 Hypothyroidism, unspecified: Secondary | ICD-10-CM | POA: Diagnosis present

## 2016-04-07 DIAGNOSIS — Z992 Dependence on renal dialysis: Secondary | ICD-10-CM

## 2016-04-07 DIAGNOSIS — K219 Gastro-esophageal reflux disease without esophagitis: Secondary | ICD-10-CM | POA: Diagnosis present

## 2016-04-07 DIAGNOSIS — I5032 Chronic diastolic (congestive) heart failure: Secondary | ICD-10-CM | POA: Diagnosis present

## 2016-04-07 DIAGNOSIS — J440 Chronic obstructive pulmonary disease with acute lower respiratory infection: Secondary | ICD-10-CM | POA: Diagnosis not present

## 2016-04-07 DIAGNOSIS — Z833 Family history of diabetes mellitus: Secondary | ICD-10-CM

## 2016-04-07 DIAGNOSIS — J189 Pneumonia, unspecified organism: Secondary | ICD-10-CM | POA: Diagnosis present

## 2016-04-07 DIAGNOSIS — E11319 Type 2 diabetes mellitus with unspecified diabetic retinopathy without macular edema: Secondary | ICD-10-CM | POA: Diagnosis present

## 2016-04-07 DIAGNOSIS — I2510 Atherosclerotic heart disease of native coronary artery without angina pectoris: Secondary | ICD-10-CM | POA: Diagnosis present

## 2016-04-07 DIAGNOSIS — E871 Hypo-osmolality and hyponatremia: Secondary | ICD-10-CM | POA: Diagnosis present

## 2016-04-07 DIAGNOSIS — J9611 Chronic respiratory failure with hypoxia: Secondary | ICD-10-CM | POA: Diagnosis present

## 2016-04-07 DIAGNOSIS — Z8701 Personal history of pneumonia (recurrent): Secondary | ICD-10-CM

## 2016-04-07 LAB — CBC WITH DIFFERENTIAL/PLATELET
BASOS ABS: 0 10*3/uL (ref 0.0–0.1)
BASOS PCT: 0 %
Eosinophils Absolute: 0 10*3/uL (ref 0.0–0.7)
Eosinophils Relative: 1 %
HEMATOCRIT: 26.7 % — AB (ref 36.0–46.0)
HEMOGLOBIN: 8.3 g/dL — AB (ref 12.0–15.0)
Lymphocytes Relative: 23 %
Lymphs Abs: 1 10*3/uL (ref 0.7–4.0)
MCH: 26.7 pg (ref 26.0–34.0)
MCHC: 31.1 g/dL (ref 30.0–36.0)
MCV: 85.9 fL (ref 78.0–100.0)
MONOS PCT: 8 %
Monocytes Absolute: 0.4 10*3/uL (ref 0.1–1.0)
NEUTROS ABS: 2.9 10*3/uL (ref 1.7–7.7)
NEUTROS PCT: 68 %
Platelets: 202 10*3/uL (ref 150–400)
RBC: 3.11 MIL/uL — AB (ref 3.87–5.11)
RDW: 16.3 % — ABNORMAL HIGH (ref 11.5–15.5)
WBC: 4.3 10*3/uL (ref 4.0–10.5)

## 2016-04-07 LAB — COMPREHENSIVE METABOLIC PANEL
ALBUMIN: 2.8 g/dL — AB (ref 3.5–5.0)
ALK PHOS: 49 U/L (ref 38–126)
ALT: 5 U/L — AB (ref 14–54)
AST: 17 U/L (ref 15–41)
Anion gap: 9 (ref 5–15)
BILIRUBIN TOTAL: 0.7 mg/dL (ref 0.3–1.2)
BUN: 23 mg/dL — AB (ref 6–20)
CALCIUM: 8.2 mg/dL — AB (ref 8.9–10.3)
CO2: 28 mmol/L (ref 22–32)
Chloride: 94 mmol/L — ABNORMAL LOW (ref 101–111)
Creatinine, Ser: 2.94 mg/dL — ABNORMAL HIGH (ref 0.44–1.00)
GFR calc Af Amer: 18 mL/min — ABNORMAL LOW (ref >60)
GFR calc non Af Amer: 16 mL/min — ABNORMAL LOW (ref >60)
GLUCOSE: 92 mg/dL (ref 65–99)
Potassium: 3.7 mmol/L (ref 3.5–5.1)
Sodium: 131 mmol/L — ABNORMAL LOW (ref 135–145)
TOTAL PROTEIN: 6.4 g/dL — AB (ref 6.5–8.1)

## 2016-04-07 LAB — I-STAT CG4 LACTIC ACID, ED: Lactic Acid, Venous: 0.91 mmol/L (ref 0.5–1.9)

## 2016-04-07 LAB — BRAIN NATRIURETIC PEPTIDE: B Natriuretic Peptide: 1608 pg/mL — ABNORMAL HIGH (ref 0.0–100.0)

## 2016-04-07 LAB — TROPONIN I: Troponin I: 0.04 ng/mL (ref <0.03)

## 2016-04-07 MED ORDER — METHYLPREDNISOLONE SODIUM SUCC 125 MG IJ SOLR
125.0000 mg | Freq: Once | INTRAMUSCULAR | Status: AC
  Administered 2016-04-07: 125 mg via INTRAVENOUS
  Filled 2016-04-07: qty 2

## 2016-04-07 MED ORDER — HYDROMORPHONE HCL 1 MG/ML IJ SOLN
0.5000 mg | INTRAMUSCULAR | Status: DC | PRN
  Administered 2016-04-07 – 2016-04-09 (×8): 0.5 mg via INTRAVENOUS
  Filled 2016-04-07 (×9): qty 1

## 2016-04-07 MED ORDER — PANTOPRAZOLE SODIUM 40 MG PO TBEC
40.0000 mg | DELAYED_RELEASE_TABLET | Freq: Every day | ORAL | Status: DC
  Filled 2016-04-07 (×2): qty 1

## 2016-04-07 MED ORDER — IPRATROPIUM-ALBUTEROL 0.5-2.5 (3) MG/3ML IN SOLN
3.0000 mL | Freq: Once | RESPIRATORY_TRACT | Status: AC
  Administered 2016-04-07: 3 mL via RESPIRATORY_TRACT
  Filled 2016-04-07: qty 3

## 2016-04-07 MED ORDER — RENA-VITE PO TABS
1.0000 | ORAL_TABLET | Freq: Every day | ORAL | Status: DC
  Administered 2016-04-08 (×2): 1 via ORAL
  Filled 2016-04-07 (×2): qty 1

## 2016-04-07 MED ORDER — DILTIAZEM HCL ER COATED BEADS 240 MG PO CP24
240.0000 mg | ORAL_CAPSULE | Freq: Every day | ORAL | Status: DC
  Administered 2016-04-08 – 2016-04-09 (×2): 240 mg via ORAL
  Filled 2016-04-07 (×2): qty 1

## 2016-04-07 MED ORDER — IPRATROPIUM-ALBUTEROL 0.5-2.5 (3) MG/3ML IN SOLN
3.0000 mL | RESPIRATORY_TRACT | Status: DC
  Administered 2016-04-08 (×2): 3 mL via RESPIRATORY_TRACT
  Filled 2016-04-07 (×2): qty 3

## 2016-04-07 MED ORDER — ALBUTEROL SULFATE (2.5 MG/3ML) 0.083% IN NEBU
2.5000 mg | INHALATION_SOLUTION | Freq: Once | RESPIRATORY_TRACT | Status: AC
  Administered 2016-04-07: 2.5 mg via RESPIRATORY_TRACT
  Filled 2016-04-07: qty 3

## 2016-04-07 MED ORDER — HYDROCODONE-ACETAMINOPHEN 7.5-325 MG PO TABS
1.0000 | ORAL_TABLET | Freq: Four times a day (QID) | ORAL | Status: DC | PRN
  Administered 2016-04-08 – 2016-04-09 (×3): 1 via ORAL
  Filled 2016-04-07 (×6): qty 1

## 2016-04-07 MED ORDER — VITAMIN B-12 1000 MCG PO TABS
1000.0000 ug | ORAL_TABLET | Freq: Every day | ORAL | Status: DC
  Filled 2016-04-07 (×2): qty 2
  Filled 2016-04-07: qty 1

## 2016-04-07 MED ORDER — BISACODYL 10 MG RE SUPP
10.0000 mg | Freq: Every day | RECTAL | Status: DC | PRN
  Filled 2016-04-07: qty 1

## 2016-04-07 MED ORDER — PIPERACILLIN-TAZOBACTAM 3.375 G IVPB 30 MIN
3.3750 g | Freq: Once | INTRAVENOUS | Status: AC
  Administered 2016-04-07: 3.375 g via INTRAVENOUS
  Filled 2016-04-07: qty 50

## 2016-04-07 MED ORDER — HYDROMORPHONE HCL 1 MG/ML IJ SOLN
0.5000 mg | Freq: Once | INTRAMUSCULAR | Status: AC
  Administered 2016-04-07: 0.5 mg via INTRAVENOUS
  Filled 2016-04-07: qty 1

## 2016-04-07 MED ORDER — ZOLPIDEM TARTRATE 5 MG PO TABS
5.0000 mg | ORAL_TABLET | Freq: Every day | ORAL | Status: DC
  Administered 2016-04-08 (×2): 5 mg via ORAL
  Filled 2016-04-07 (×2): qty 1

## 2016-04-07 MED ORDER — THYROID 97.5 MG PO TABS
243.7500 mg | ORAL_TABLET | Freq: Every day | ORAL | Status: DC
  Administered 2016-04-08 – 2016-04-09 (×2): 243.75 mg via ORAL

## 2016-04-07 MED ORDER — METOPROLOL TARTRATE 25 MG PO TABS
12.5000 mg | ORAL_TABLET | Freq: Two times a day (BID) | ORAL | Status: DC
  Administered 2016-04-08 – 2016-04-09 (×4): 12.5 mg via ORAL
  Filled 2016-04-07 (×4): qty 1

## 2016-04-07 MED ORDER — LEVOFLOXACIN IN D5W 500 MG/100ML IV SOLN
500.0000 mg | Freq: Once | INTRAVENOUS | Status: AC
  Administered 2016-04-08: 500 mg via INTRAVENOUS
  Filled 2016-04-07: qty 100

## 2016-04-07 MED ORDER — APIXABAN 5 MG PO TABS
5.0000 mg | ORAL_TABLET | Freq: Two times a day (BID) | ORAL | Status: DC
  Administered 2016-04-08: 5 mg via ORAL
  Filled 2016-04-07 (×2): qty 1

## 2016-04-07 MED ORDER — ALPRAZOLAM 0.5 MG PO TABS: 0.5000 mg | ORAL_TABLET | Freq: Four times a day (QID) | ORAL | Status: DC

## 2016-04-07 MED ORDER — HYDRALAZINE HCL 25 MG PO TABS
100.0000 mg | ORAL_TABLET | Freq: Three times a day (TID) | ORAL | Status: DC
  Administered 2016-04-08 – 2016-04-09 (×3): 100 mg via ORAL
  Filled 2016-04-07 (×5): qty 4

## 2016-04-07 MED ORDER — POLYETHYLENE GLYCOL 3350 17 G PO PACK
17.0000 g | PACK | Freq: Every day | ORAL | Status: DC
  Administered 2016-04-08 – 2016-04-09 (×2): 17 g via ORAL
  Filled 2016-04-07 (×2): qty 1

## 2016-04-07 MED ORDER — SEVELAMER CARBONATE 800 MG PO TABS
2400.0000 mg | ORAL_TABLET | Freq: Three times a day (TID) | ORAL | Status: DC
  Administered 2016-04-08 – 2016-04-09 (×5): 2400 mg via ORAL
  Filled 2016-04-07 (×5): qty 3

## 2016-04-07 MED ORDER — MEGESTROL ACETATE 40 MG/ML PO SUSP
400.0000 mg | Freq: Every day | ORAL | Status: DC
  Filled 2016-04-07 (×4): qty 10

## 2016-04-07 MED ORDER — VANCOMYCIN HCL IN DEXTROSE 1-5 GM/200ML-% IV SOLN
1000.0000 mg | Freq: Once | INTRAVENOUS | Status: AC
  Administered 2016-04-07: 1000 mg via INTRAVENOUS
  Filled 2016-04-07: qty 200

## 2016-04-07 MED ORDER — ALBUTEROL SULFATE (2.5 MG/3ML) 0.083% IN NEBU: 3.0000 mL | INHALATION_SOLUTION | Freq: Four times a day (QID) | RESPIRATORY_TRACT | Status: DC | PRN

## 2016-04-07 NOTE — ED Notes (Signed)
Patient transported to X-ray 

## 2016-04-07 NOTE — ED Provider Notes (Signed)
[facility] DEPT Provider Note   CSN: [id number] Arrival date & time: 04/07/16  1652     History   Chief Complaint Chief Complaint  Patient presents with  . Pneumonia    HPI Anita [name] is a 65 y.o. female.  Patient states she's had fever and cough for a week. She has been getting antibiotics in dialysis Monday Wednesday Friday. Patient states she's not getting any better she is continuing with shortness of breath fever and cough   The history is provided by the patient. No language interpreter was used.  Pneumonia  This is a new problem. The current episode started more than 2 days ago. The problem occurs constantly. The problem has not changed since onset.Associated symptoms include shortness of breath. Pertinent negatives include no chest pain, no abdominal pain and no headaches. Nothing aggravates the symptoms. Nothing relieves the symptoms. Treatments tried: Antibiotics. The treatment provided no relief.    Past Medical History:  Diagnosis Date  . Anemia   . Aortic stenosis 12/23/2012  . Atherosclerosis of aorta ([place])   . Atrial fibrillation ([place])    Diagnosed 11/14 of undetermined age of onset    . CAD (coronary artery disease) 07/13/2013   Catheterization 5 years ago by Dr. Elisabeth Cara with reportedly nonobstructive disease Calcification noted on CT scan of chest in November of 2014   . CHF (congestive heart failure) ([place])   . Chronic diastolic heart failure ([place])   . COPD (chronic obstructive pulmonary disease) ([place])   . 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 Diabetic nephropathy ([place])   . Diabetic neuropathy ([place])   . Dyslipidemia   . ESRD on hemodialysis ([place])   . GERD (gastroesophageal reflux disease)   . Hypertension   . Laryngeal mass   . Neuropathy due to secondary diabetes ([place])   . PONV (postoperative nausea and vomiting)   . 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 Shortness of breath dyspnea    with exertion  . Sleep apnea    Does not use CPAP- due to weight loss  . Thyroid neoplasm    of uncertain behavior  . Wears glasses     Patient Active Problem List   Diagnosis Date Noted  . SBO (small bowel obstruction) ([place]) 02/15/2016  . Small bowel obstruction ([place]) 02/15/2016  . Chest pain 12/30/2015  . Chest pain with moderate risk of acute coronary syndrome 10/23/2015  . Abnormal EKG 10/23/2015  . Pain in the chest   . ESRD (end stage renal disease) ([place])   . Pulmonary infiltrate   . HCAP- Jan 2017 08/31/2015  . Acute respiratory failure with hypoxia ([place]) 08/31/2015  . Elevated troponin 08/31/2015  . Essential hypertension 08/31/2015  . PNA (pneumonia) 08/28/2015  . 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 PAF (paroxysmal atrial fibrillation) ([place]) 02/27/2014  . Right foot injury 09/07/2013  . CAD- non obstructive CAD 2009 07/13/2013  . Protein-calorie malnutrition, severe ([place]) 07/13/2013  . 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 Atherosclerosis of aorta ([place])   . ESRD on hemodialysis ([place])   . Chronic diastolic heart failure ([place])   . Diabetic nephropathy ([place])   . Neuropathy due to secondary diabetes ([place])   . Long-term (current) use of anticoagulants   . Edema 03/21/2013  . Aortic stenosis 12/23/2012  . Obesity, unspecified 10/22/2011  . Secondary hyperparathyroidism, non-renal ([place]) 07/05/2009  Past Surgical History:  Procedure Laterality Date  . ACHILLES TENDON REPAIR Right   . APPENDECTOMY    . AV FISTULA PLACEMENT Left 12/29/2012   Procedure: ARTERIOVENOUS (AV) FISTULA CREATION;  Surgeon: Elam Dutch, MD;  Location: [facility] OR;  Service: Vascular;  Laterality: Left;  Creation Left Brachial Cephalic Arteriovenous Fistula  . CARDIAC CATHETERIZATION    . CESAREAN SECTION     X 2   . CHOLECYSTECTOMY    . Elbow tendon surgery Right   . HEMORRHOID SURGERY     many years ago  . LARYNGECTOMY     mass  removed- noncancerous  . LUMBAR LAMINECTOMY     lower back  . SHOULDER ARTHROSCOPY Right    w/ repair of rotator cuff repair  . THYROIDECTOMY N/A 10/19/2014   Procedure: TOTAL THYROIDECTOMY;  Surgeon: Armandina Gemma, MD;  Location: [facility];  Service: General;  Laterality: N/A;    OB History    No data available       Home Medications    Prior to Admission medications   Medication Sig Start Date End Date Taking? Authorizing Provider  ALPRAZolam Duanne Moron) 1 MG tablet Take 0.5-1 mg by mouth 4 (four) times daily. Patient takes 1/2 tablet 2-3 times daily and 1 tablet at bedtime   Yes Historical Provider, MD  bisacodyl (DULCOLAX) 10 MG suppository Place 1 suppository (10 mg total) rectally daily as needed for moderate constipation. 02/16/16  Yes Kathie Dike, MD  Cyanocobalamin (VITAMIN B 12 PO) Take 1,000 mg by mouth daily.   Yes Historical Provider, MD  diltiazem (CARDIZEM CD) 240 MG 24 hr capsule TAKE (1) CAPSULE BY MOUTH AT BEDTIME. 04/03/16  Yes Arnoldo Lenis, MD  ELIQUIS 5 MG TABS tablet Take 5 mg by mouth 2 (two) times daily.  01/01/14  Yes Historical Provider, MD  glycerin, Pediatric, 1.2 g SUPP Place 1 suppository rectally daily as needed for moderate constipation.   Yes Historical Provider, MD  hydrALAZINE (APRESOLINE) 100 MG tablet Take 100 mg by mouth 3 (three) times daily. 11/29/15  Yes Historical Provider, MD  HYDROcodone-acetaminophen (NORCO) 7.5-325 MG per tablet Take 1 tablet by mouth every 6 (six) hours as needed for pain. Patient taking differently: Take 1-2 tablets by mouth every 6 (six) hours as needed for moderate pain or severe pain.  02/15/13  Yes Regina J Roczniak, PA-C  ipratropium-albuterol (DUONEB) 0.5-2.5 (3) MG/3ML SOLN Take 3 mLs by nebulization every 4 (four) hours as needed. Patient taking differently: Take 3 mLs by nebulization 2 (two) times daily. *May take up to 4 times daily* 09/03/15  Yes Kathie Dike, MD  meclizine (ANTIVERT) 25 MG tablet Take 25 mg by mouth 4  (four) times daily as needed for dizziness.   Yes Historical Provider, MD  megestrol (MEGACE) 40 MG/ML suspension Take 400 mg by mouth daily.  02/08/16  Yes Historical Provider, MD  metoprolol tartrate (LOPRESSOR) 25 MG tablet Take 0.5 tablets (12.5 mg total) by mouth 2 (two) times daily. 09/03/15  Yes Kathie Dike, MD  multivitamin (RENA-VIT) TABS tablet Take 1 tablet by mouth at bedtime. 07/18/13  Yes Blain Pais, MD  nitroGLYCERIN (NITROSTAT) 0.4 MG SL tablet Place 1 tablet (0.4 mg total) under the tongue every 5 (five) minutes as needed for chest pain. 10/24/15  Yes Smiley Houseman, MD  omeprazole (PRILOSEC) 20 MG capsule Take 20 mg by mouth daily as needed (acid reflux).    Yes Historical Provider, MD  ondansetron (ZOFRAN) 4 MG tablet Take 4  mg by mouth every 8 (eight) hours as needed for nausea or vomiting.  12/16/14  Yes Historical Provider, MD  polyethylene glycol (MIRALAX / GLYCOLAX) packet Take 17 g by mouth daily.   Yes Historical Provider, MD  PROAIR HFA 108 [phone] Base) MCG/ACT inhaler Inhale 1-2 puffs into the lungs every 6 (six) hours as needed for wheezing or shortness of breath.  02/12/16  Yes Historical Provider, MD  sevelamer carbonate (RENVELA) 800 MG tablet Take 1,600-2,400 mg by mouth 3 (three) times daily with meals. 3 with meals and 2 with snacks   Yes Historical Provider, MD  Thyroid 97.5 MG TABS Take 195 mg by mouth daily. 2 and 1/2 tablet once daily   Yes Historical Provider, MD  zolpidem (AMBIEN) 10 MG tablet Take 10 mg by mouth at bedtime.    Yes Historical Provider, MD    Family History Family History  Problem Relation Age of Onset  . COPD Mother   . Thyroid disease Mother   . Multiple sclerosis Father   . Heart disease Father   . Heart attack Father   . Depression Brother     Suicide  . Alcohol abuse Brother   . Heart disease Brother   . Asthma Brother   . Thyroid disease Daughter   . Diabetes Maternal Aunt     Social History Social History    Substance Use Topics  . Smoking status: Current Some Day Smoker    Packs/day: 0.50    Years: 40.00    Types: Cigarettes, E-cigarettes    Start date: 08/05/1969  . Smokeless tobacco: Never Used  . Alcohol use No     Allergies   Doxycycline; Iodine; and Lipitor [atorvastatin]   Review of Systems Review of Systems  Constitutional: Negative for appetite change and fatigue.  HENT: Negative for congestion, ear discharge and sinus pressure.   Eyes: Negative for discharge.  Respiratory: Positive for cough and shortness of breath.   Cardiovascular: Negative for chest pain.  Gastrointestinal: Negative for abdominal pain and diarrhea.  Genitourinary: Negative for frequency and hematuria.  Musculoskeletal: Negative for back pain.  Skin: Negative for rash.  Neurological: Negative for seizures and headaches.  Psychiatric/Behavioral: Negative for hallucinations.     Physical Exam Updated Vital Signs BP 163/67   Pulse 88   Temp 98.7 F (37.1 C) (Oral)   Resp 23   Ht 5' 6.5" (1.689 m)   Wt 130 lb (59 kg)   SpO2 99%   BMI 20.67 kg/m   Physical Exam  Constitutional: She is oriented to person, place, and time. She appears well-developed.  HENT:  Head: Normocephalic.  Eyes: Conjunctivae and EOM are normal. No scleral icterus.  Neck: Neck supple. No thyromegaly present.  Cardiovascular: Normal rate and regular rhythm.  Exam reveals no gallop and no friction rub.   No murmur heard. Pulmonary/Chest: No stridor. She has wheezes. She has no rales. She exhibits no tenderness.  Abdominal: She exhibits no distension. There is no tenderness. There is no rebound.  Musculoskeletal: Normal range of motion. She exhibits no edema.  Lymphadenopathy:    She has no cervical adenopathy.  Neurological: She is oriented to person, place, and time. She exhibits normal muscle tone. Coordination normal.  Skin: No rash noted. No erythema.  Psychiatric: She has a normal mood and affect. Her behavior is  normal.     ED Treatments / Results  Labs (all labs ordered are listed, but only abnormal results are displayed) Labs Reviewed  CBC WITH  DIFFERENTIAL/PLATELET - Abnormal; Notable for the following:       Result Value   RBC 3.11 (*)    Hemoglobin 8.3 (*)    HCT 26.7 (*)    RDW 16.3 (*)    All other components within normal limits  COMPREHENSIVE METABOLIC PANEL - Abnormal; Notable for the following:    Sodium 131 (*)    Chloride 94 (*)    BUN 23 (*)    Creatinine, Ser 2.94 (*)    Calcium 8.2 (*)    Total Protein 6.4 (*)    Albumin 2.8 (*)    ALT 5 (*)    GFR calc non Af Amer 16 (*)    GFR calc Af Amer 18 (*)    All other components within normal limits  BRAIN NATRIURETIC PEPTIDE - Abnormal; Notable for the following:    B Natriuretic Peptide 1,608.0 (*)    All other components within normal limits  TROPONIN I - Abnormal; Notable for the following:    Troponin I 0.04 (*)    All other components within normal limits  I-STAT CG4 LACTIC ACID, ED  I-STAT CG4 LACTIC ACID, ED    EKG  EKG Interpretation  Date/Time:  Monday April 07 2016 20:05:53 EDT Ventricular Rate:  88 PR Interval:    QRS Duration: 103 QT Interval:  420 QTC Calculation: 509 R Axis:   55 Text Interpretation:  Sinus rhythm Left ventricular hypertrophy Prolonged QT interval Confirmed by Laray Corbit  MD, Eliah Marquard [phone]) on 04/07/2016 9:35:54 PM       Radiology Dg Chest 2 View  Result Date: 04/07/2016 CLINICAL DATA:  Shortness of breath with chest pain and congestion after dialysis. EXAM: CHEST  2 VIEW COMPARISON:  12/30/2015 FINDINGS: Heart is enlarged. There is interstitial pulmonary edema. Small bilateral pleural effusions are present. More focal opacity at the right lung base may be related to infectious infiltrate versus chronic changes. There has been opacity at the right lung base on numerous prior imaging studies. IMPRESSION: 1. Cardiomegaly and interstitial edema. 2. Small bilateral pleural effusions. 3.  Right basilar opacity consistent with with acute versus chronic infiltrate. Electronically Signed   By: Nolon Nations M.D.   On: 04/07/2016 17:38    Procedures Procedures (including critical care time)  Medications Ordered in ED Medications  methylPREDNISolone sodium succinate (SOLU-MEDROL) 125 mg/2 mL injection 125 mg (125 mg Intravenous Given 04/07/16 1813)  ipratropium-albuterol (DUONEB) 0.5-2.5 (3) MG/3ML nebulizer solution 3 mL (3 mLs Nebulization Given 04/07/16 1819)  albuterol (PROVENTIL) (2.5 MG/3ML) 0.083% nebulizer solution 2.5 mg (2.5 mg Nebulization Given 04/07/16 1819)  vancomycin (VANCOCIN) IVPB 1000 mg/200 mL premix (0 mg Intravenous Stopped 04/07/16 1914)  piperacillin-tazobactam (ZOSYN) IVPB 3.375 g (0 g Intravenous Stopped 04/07/16 1844)  HYDROmorphone (DILAUDID) injection 0.5 mg (0.5 mg Intravenous Given 04/07/16 1828)  HYDROmorphone (DILAUDID) injection 0.5 mg (0.5 mg Intravenous Given 04/07/16 1959)     Initial Impression / Assessment and Plan / ED Course  I have reviewed the triage vital signs and the nursing notes.  Pertinent labs & imaging results that were available during my care of the patient were reviewed by me and considered in my medical decision making (see chart for details).  Clinical Course    Patient will be admitted for hospital-acquired pneumonia  Final Clinical Impressions(s) / ED Diagnoses   Final diagnoses:  Healthcare-associated pneumonia    New Prescriptions New Prescriptions   No medications on file     Milton Ferguson, MD 04/07/16 2216

## 2016-04-07 NOTE — ED Notes (Signed)
CRITICAL VALUE ALERT  Critical value received:  Troponin 0.04  Date of notification:  04/07/2016  Time of notification:  2046  Critical value read back:Yes.    Nurse who received alert:  Fabio Neighbors RN  MD notified (1st page): Dr. Roderic Palau   Time of first page:  2046  MD notified (2nd page):  Time of second page:  Responding MD:    Time MD responded:  2046

## 2016-04-07 NOTE — ED Triage Notes (Signed)
Pt states she was sent here by Dr. Luan Pulling for evaluation of pneumonia. Pt states she has been receiving an antibiotic for a while on the days she has dialysis. States she had a chest x-ray today

## 2016-04-07 NOTE — H&P (Signed)
History and Physical    Anita [name] [name] DOB: 1950-11-25 DOA: 04/07/2016  Referring MD/NP/PA:  PCP: Robert Bellow, MD  Outpatient Specialists:   Endocrinology: Jacelyn Pi, MD  Nephrology: Edrick Oh, MD  Arnoldo Lenis, MD  Patient coming from: Home  Chief Complaint: PNA  HPI: Anita [name] is a 65 y.o. female with medical history significant of ESRD on HD( MWF), HTN, PAF, and long term use of anticoagulants, presents to the ED via EMS with complaints of weakness and increase back pain and "pain everywhere" that onset a few days ago. She has been going to her normal dialysis treatments on Monday, Wednesday, and Friday. She admits to some pain in her back. She also admits to chills but denies any phlegm.  ED Course: While in the ED, sodium 131, BUN 23, Cr 2.94, ALT 5, BNP 1,608, Troponin 0.04, Lactic acid 0.91, and Hgb 8.3. CXR showed cardiomegaly and interstitial edema along with right basilar opacity consistent with acute versus chronic infiltrate. EKG shows sinus rhythm.  She was given IV steroid, and IV Van/Zosyn and hospitalsit was asked to admit  Her for further management of her PNA.  Review of Systems: As per HPI otherwise 10 point review of systems negative.   Past Medical History:  Diagnosis Date  . Anemia   . Aortic stenosis 12/23/2012  . Atherosclerosis of aorta ([place])   . Atrial fibrillation ([place])    Diagnosed 11/14 of undetermined age of onset    . CAD (coronary artery disease) 07/13/2013   Catheterization 5 years ago by Dr. Elisabeth Cara with reportedly nonobstructive disease Calcification noted on CT scan of chest in November of 2014   . CHF (congestive heart failure) ([place])   . Chronic diastolic heart failure ([place])   . COPD (chronic obstructive pulmonary disease) ([place])   . 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 Diabetic nephropathy ([place])   . Diabetic neuropathy ([place])   . Dyslipidemia   . ESRD on hemodialysis ([place])   . GERD  (gastroesophageal reflux disease)   . Hypertension   . Laryngeal mass   . Neuropathy due to secondary diabetes ([place])   . PONV (postoperative nausea and vomiting)   . 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 Shortness of breath dyspnea    with exertion  . Sleep apnea    Does not use CPAP- due to weight loss  . Thyroid neoplasm    of uncertain behavior  . Wears glasses     Past Surgical History:  Procedure Laterality Date  . ACHILLES TENDON REPAIR Right   . APPENDECTOMY    . AV FISTULA PLACEMENT Left 12/29/2012   Procedure: ARTERIOVENOUS (AV) FISTULA CREATION;  Surgeon: Elam Dutch, MD;  Location[facility];  Service: Vascular;  Laterality: Left;  Creation Left Brachial Cephalic Arteriovenous Fistula  . CARDIAC CATHETERIZATION    . CESAREAN SECTION     X 2   . CHOLECYSTECTOMY    . Elbow tendon surgery Right   . HEMORRHOID SURGERY     many years ago  . LARYNGECTOMY     mass removed- noncancerous  . LUMBAR LAMINECTOMY     lower back  . SHOULDER ARTHROSCOPY Right    w/ repair of rotator cuff repair  . THYROIDECTOMY N/A 10/19/2014   Procedure: TOTAL THYROIDECTOMY;  Surgeon: Armandina Gemma, MD;  Location: [facility];  Service: General;  Laterality: N/A;     reports that she has been smoking Cigarettes  and E-cigarettes.  She started smoking about 46 years ago. She has a 20.00 pack-year smoking history. She has never used smokeless tobacco. She reports that she does not drink alcohol or use drugs.  Allergies  Allergen Reactions  . Doxycycline Nausea And Vomiting  . Iodine   . Lipitor [Atorvastatin] Nausea And Vomiting    Family History  Problem Relation Age of Onset  . COPD Mother   . Thyroid disease Mother   . Multiple sclerosis Father   . Heart disease Father   . Heart attack Father   . Depression Brother     Suicide  . Alcohol abuse Brother   . Heart disease Brother   . Asthma Brother   . Thyroid disease Daughter   . Diabetes Maternal Aunt     Prior to Admission  medications   Medication Sig Start Date End Date Taking? Authorizing Provider  ALPRAZolam Duanne Moron) 1 MG tablet Take 0.5-1 mg by mouth 4 (four) times daily. Patient takes 1/2 tablet 2-3 times daily and 1 tablet at bedtime   Yes Historical Provider, MD  bisacodyl (DULCOLAX) 10 MG suppository Place 1 suppository (10 mg total) rectally daily as needed for moderate constipation. 02/16/16  Yes Kathie Dike, MD  Cyanocobalamin (VITAMIN B 12 PO) Take 1,000 mg by mouth daily.   Yes Historical Provider, MD  diltiazem (CARDIZEM CD) 240 MG 24 hr capsule TAKE (1) CAPSULE BY MOUTH AT BEDTIME. 04/03/16  Yes Arnoldo Lenis, MD  ELIQUIS 5 MG TABS tablet Take 5 mg by mouth 2 (two) times daily.  01/01/14  Yes Historical Provider, MD  glycerin, Pediatric, 1.2 g SUPP Place 1 suppository rectally daily as needed for moderate constipation.   Yes Historical Provider, MD  hydrALAZINE (APRESOLINE) 100 MG tablet Take 100 mg by mouth 3 (three) times daily. 11/29/15  Yes Historical Provider, MD  HYDROcodone-acetaminophen (NORCO) 7.5-325 MG per tablet Take 1 tablet by mouth every 6 (six) hours as needed for pain. Patient taking differently: Take 1-2 tablets by mouth every 6 (six) hours as needed for moderate pain or severe pain.  02/15/13  Yes Regina J Roczniak, PA-C  ipratropium-albuterol (DUONEB) 0.5-2.5 (3) MG/3ML SOLN Take 3 mLs by nebulization every 4 (four) hours as needed. Patient taking differently: Take 3 mLs by nebulization 2 (two) times daily. *May take up to 4 times daily* 09/03/15  Yes Kathie Dike, MD  meclizine (ANTIVERT) 25 MG tablet Take 25 mg by mouth 4 (four) times daily as needed for dizziness.   Yes Historical Provider, MD  megestrol (MEGACE) 40 MG/ML suspension Take 400 mg by mouth daily.  02/08/16  Yes Historical Provider, MD  metoprolol tartrate (LOPRESSOR) 25 MG tablet Take 0.5 tablets (12.5 mg total) by mouth 2 (two) times daily. 09/03/15  Yes Kathie Dike, MD  multivitamin (RENA-VIT) TABS tablet Take 1  tablet by mouth at bedtime. 07/18/13  Yes Blain Pais, MD  nitroGLYCERIN (NITROSTAT) 0.4 MG SL tablet Place 1 tablet (0.4 mg total) under the tongue every 5 (five) minutes as needed for chest pain. 10/24/15  Yes Smiley Houseman, MD  omeprazole (PRILOSEC) 20 MG capsule Take 20 mg by mouth daily as needed (acid reflux).    Yes Historical Provider, MD  ondansetron (ZOFRAN) 4 MG tablet Take 4 mg by mouth every 8 (eight) hours as needed for nausea or vomiting.  12/16/14  Yes Historical Provider, MD  polyethylene glycol (MIRALAX / GLYCOLAX) packet Take 17 g by mouth daily.   Yes Historical Provider, MD  PROAIR HFA 108 (90 Base) MCG/ACT inhaler Inhale 1-2 puffs into the lungs every 6 (six) hours as needed for wheezing or shortness of breath.  02/12/16  Yes Historical Provider, MD  sevelamer carbonate (RENVELA) 800 MG tablet Take 1,600-2,400 mg by mouth 3 (three) times daily with meals. 3 with meals and 2 with snacks   Yes Historical Provider, MD  Thyroid 97.5 MG TABS Take 195 mg by mouth daily. 2 and 1/2 tablet once daily   Yes Historical Provider, MD  zolpidem (AMBIEN) 10 MG tablet Take 10 mg by mouth at bedtime.    Yes Historical Provider, MD    Physical Exam: Vitals:   04/07/16 1820 04/07/16 1920 04/07/16 2100 04/07/16 2130  BP:  154/64 156/63 163/67  Pulse:  85 85 88  Resp:  24 15 23   Temp:      TempSrc:      SpO2: 100% 100% 99% 99%  Weight:      Height:          Constitutional: NAD, calm, comfortable Vitals:   04/07/16 1820 04/07/16 1920 04/07/16 2100 04/07/16 2130  BP:  154/64 156/63 163/67  Pulse:  85 85 88  Resp:  24 15 23   Temp:      TempSrc:      SpO2: 100% 100% 99% 99%  Weight:      Height:       Eyes: PERRL, lids and conjunctivae normal ENMT: Mucous membranes are moist. Posterior pharynx clear of any exudate or lesions.Normal dentition.  Neck: normal, supple, no masses, no thyromegaly Respiratory: clear to auscultation bilaterally, no wheezing, no crackles. Normal  respiratory effort. No accessory muscle use.  Cardiovascular: Regular rate and rhythm, no murmurs / rubs / gallops. No extremity edema. 2+ pedal pulses. No carotid bruits.  Abdomen: no tenderness, no masses palpated. No hepatosplenomegaly. Bowel sounds positive.  Musculoskeletal: no clubbing / cyanosis. No joint deformity upper and lower extremities. Good ROM, no contractures. Normal muscle tone.  Skin: no rashes, lesions, ulcers. No induration Neurologic: CN 2-12 grossly intact. Sensation intact, DTR normal. Strength 5/5 in all 4.  Psychiatric: Normal judgment and insight. Alert and oriented x 3. Normal mood.   Labs on Admission: I have personally reviewed following labs and imaging studies  CBC:  Recent Labs Lab 04/07/16 1751  WBC 4.3  NEUTROABS 2.9  HGB 8.3*  HCT 26.7*  MCV 85.9  PLT 123XX123   Basic Metabolic Panel:  Recent Labs Lab 04/07/16 1751  NA 131*  K 3.7  CL 94*  CO2 28  GLUCOSE 92  BUN 23*  CREATININE 2.94*  CALCIUM 8.2*   Liver Function Tests:  Recent Labs Lab 04/07/16 1751  AST 17  ALT 5*  ALKPHOS 49  BILITOT 0.7  PROT 6.4*  ALBUMIN 2.8*    Recent Labs Lab 04/07/16 1751  TROPONINI 0.04*   Urine analysis:    Component Value Date/Time   COLORURINE YELLOW 10/22/2015 1602   APPEARANCEUR CLOUDY (A) 10/22/2015 1602   LABSPEC 1.017 10/22/2015 1602   PHURINE 6.5 10/22/2015 1602   GLUCOSEU 100 (A) 10/22/2015 1602   HGBUR MODERATE (A) 10/22/2015 1602   BILIRUBINUR NEGATIVE 10/22/2015 1602   KETONESUR NEGATIVE 10/22/2015 1602   PROTEINUR >300 (A) 10/22/2015 1602   NITRITE NEGATIVE 10/22/2015 1602   LEUKOCYTESUR NEGATIVE 10/22/2015 1602    Radiological Exams on Admission: Dg Chest 2 View  Result Date: 04/07/2016 CLINICAL DATA:  Shortness of breath with chest pain and congestion after dialysis. EXAM: CHEST  2  VIEW COMPARISON:  12/30/2015 FINDINGS: Heart is enlarged. There is interstitial pulmonary edema. Small bilateral pleural effusions are  present. More focal opacity at the right lung base may be related to infectious infiltrate versus chronic changes. There has been opacity at the right lung base on numerous prior imaging studies. IMPRESSION: 1. Cardiomegaly and interstitial edema. 2. Small bilateral pleural effusions. 3. Right basilar opacity consistent with with acute versus chronic infiltrate. Electronically Signed   By: Nolon Nations M.D.   On: 04/07/2016 17:38    EKG: Independently reviewed. Sinus Rhythm.  Assessment/Plan Principal Problem:   PNA (pneumonia) Active Problems:   Aortic stenosis   Long-term (current) use of anticoagulants   PAF (paroxysmal atrial fibrillation) ([place])   HCAP- Jan 2017   Elevated troponin   ESRD (end stage renal disease) ([place])   1. PNA:  . CXR shows right basilar opacity consistent with acute versus chronic infiltrate. I am not sure if she has persistent HCAP, or this is a residual infiltrate from previous PNA.   Given her not feeling better, and persistent X ray finding, will continue with IV Van/Zosyn.  I will add IV Levqauin for atypical PNA as well.  2. ESRD. Both BUN 23 and Cr 2.94 are elevated. Monitor input and output.  She is due for next dialysis Wednes. 3. PAF. Troponin is elevated. EKG showed sinus rhythm. Pt is chronically on blood thinners. Continue outpatient regimen 4. Elevated troponin. Troponin 0.04.  Unlikely to be ACS.  Will not pursue.  5. Aortic stenosis. Avoid hypotension.  Will follow.    DVT prophylaxis: Heparin Code Status: Full Family Communication: Sister at bedside Disposition Plan: Discharge once improved Consults called: None Admission status: Admit to inpatient   Orvan Falconer, MD FACP [facility]  If 7PM-7AM, please contact night-coverage [email or web address] Password [facility]  04/07/2016, 9:43 PM   By signing my name below, I, Hilbert Odor, attest that this documentation has been prepared under the direction and in the presence of Orvan Falconer,  MD. Electronically signed: Hilbert Odor, Scribe.  04/07/16, 9:50 PM

## 2016-04-08 ENCOUNTER — Encounter (HOSPITAL_COMMUNITY): Payer: Self-pay | Admitting: Emergency Medicine

## 2016-04-08 DIAGNOSIS — Z8701 Personal history of pneumonia (recurrent): Secondary | ICD-10-CM | POA: Diagnosis not present

## 2016-04-08 DIAGNOSIS — J189 Pneumonia, unspecified organism: Secondary | ICD-10-CM | POA: Diagnosis not present

## 2016-04-08 DIAGNOSIS — I2510 Atherosclerotic heart disease of native coronary artery without angina pectoris: Secondary | ICD-10-CM | POA: Diagnosis present

## 2016-04-08 DIAGNOSIS — N186 End stage renal disease: Secondary | ICD-10-CM | POA: Diagnosis present

## 2016-04-08 DIAGNOSIS — I480 Paroxysmal atrial fibrillation: Secondary | ICD-10-CM | POA: Diagnosis present

## 2016-04-08 DIAGNOSIS — Z818 Family history of other mental and behavioral disorders: Secondary | ICD-10-CM | POA: Diagnosis not present

## 2016-04-08 DIAGNOSIS — Z8249 Family history of ischemic heart disease and other diseases of the circulatory system: Secondary | ICD-10-CM | POA: Diagnosis not present

## 2016-04-08 DIAGNOSIS — I132 Hypertensive heart and chronic kidney disease with heart failure and with stage 5 chronic kidney disease, or end stage renal disease: Secondary | ICD-10-CM | POA: Diagnosis present

## 2016-04-08 DIAGNOSIS — I350 Nonrheumatic aortic (valve) stenosis: Secondary | ICD-10-CM | POA: Diagnosis present

## 2016-04-08 DIAGNOSIS — E871 Hypo-osmolality and hyponatremia: Secondary | ICD-10-CM | POA: Diagnosis present

## 2016-04-08 DIAGNOSIS — Z825 Family history of asthma and other chronic lower respiratory diseases: Secondary | ICD-10-CM | POA: Diagnosis not present

## 2016-04-08 DIAGNOSIS — K219 Gastro-esophageal reflux disease without esophagitis: Secondary | ICD-10-CM | POA: Diagnosis present

## 2016-04-08 DIAGNOSIS — Z833 Family history of diabetes mellitus: Secondary | ICD-10-CM | POA: Diagnosis not present

## 2016-04-08 DIAGNOSIS — R7989 Other specified abnormal findings of blood chemistry: Secondary | ICD-10-CM | POA: Diagnosis not present

## 2016-04-08 DIAGNOSIS — E1140 Type 2 diabetes mellitus with diabetic neuropathy, unspecified: Secondary | ICD-10-CM | POA: Diagnosis present

## 2016-04-08 DIAGNOSIS — Z992 Dependence on renal dialysis: Secondary | ICD-10-CM | POA: Diagnosis not present

## 2016-04-08 DIAGNOSIS — J9611 Chronic respiratory failure with hypoxia: Secondary | ICD-10-CM | POA: Diagnosis not present

## 2016-04-08 DIAGNOSIS — I5032 Chronic diastolic (congestive) heart failure: Secondary | ICD-10-CM | POA: Diagnosis not present

## 2016-04-08 DIAGNOSIS — E785 Hyperlipidemia, unspecified: Secondary | ICD-10-CM | POA: Diagnosis present

## 2016-04-08 DIAGNOSIS — Y95 Nosocomial condition: Secondary | ICD-10-CM | POA: Diagnosis present

## 2016-04-08 DIAGNOSIS — R531 Weakness: Secondary | ICD-10-CM | POA: Diagnosis present

## 2016-04-08 DIAGNOSIS — E1122 Type 2 diabetes mellitus with diabetic chronic kidney disease: Secondary | ICD-10-CM | POA: Diagnosis present

## 2016-04-08 DIAGNOSIS — I5033 Acute on chronic diastolic (congestive) heart failure: Secondary | ICD-10-CM | POA: Diagnosis present

## 2016-04-08 DIAGNOSIS — J440 Chronic obstructive pulmonary disease with acute lower respiratory infection: Secondary | ICD-10-CM | POA: Diagnosis present

## 2016-04-08 DIAGNOSIS — E1121 Type 2 diabetes mellitus with diabetic nephropathy: Secondary | ICD-10-CM | POA: Diagnosis present

## 2016-04-08 DIAGNOSIS — E11319 Type 2 diabetes mellitus with unspecified diabetic retinopathy without macular edema: Secondary | ICD-10-CM | POA: Diagnosis present

## 2016-04-08 DIAGNOSIS — E039 Hypothyroidism, unspecified: Secondary | ICD-10-CM | POA: Diagnosis present

## 2016-04-08 DIAGNOSIS — E038 Other specified hypothyroidism: Secondary | ICD-10-CM

## 2016-04-08 DIAGNOSIS — Z820 Family history of epilepsy and other diseases of the nervous system: Secondary | ICD-10-CM | POA: Diagnosis not present

## 2016-04-08 DIAGNOSIS — Z7901 Long term (current) use of anticoagulants: Secondary | ICD-10-CM | POA: Diagnosis not present

## 2016-04-08 LAB — COMPREHENSIVE METABOLIC PANEL
ALK PHOS: 53 U/L (ref 38–126)
ALT: 7 U/L — ABNORMAL LOW (ref 14–54)
ANION GAP: 9 (ref 5–15)
AST: 23 U/L (ref 15–41)
Albumin: 2.9 g/dL — ABNORMAL LOW (ref 3.5–5.0)
BUN: 33 mg/dL — ABNORMAL HIGH (ref 6–20)
CALCIUM: 8.4 mg/dL — AB (ref 8.9–10.3)
CHLORIDE: 94 mmol/L — AB (ref 101–111)
CO2: 26 mmol/L (ref 22–32)
Creatinine, Ser: 3.63 mg/dL — ABNORMAL HIGH (ref 0.44–1.00)
GFR, EST AFRICAN AMERICAN: 14 mL/min — AB (ref >60)
GFR, EST NON AFRICAN AMERICAN: 12 mL/min — AB (ref >60)
Glucose, Bld: 160 mg/dL — ABNORMAL HIGH (ref 65–99)
Potassium: 4.3 mmol/L (ref 3.5–5.1)
SODIUM: 129 mmol/L — AB (ref 135–145)
Total Bilirubin: 0.4 mg/dL (ref 0.3–1.2)
Total Protein: 6.3 g/dL — ABNORMAL LOW (ref 6.5–8.1)

## 2016-04-08 LAB — CBC
HCT: 27.7 % — ABNORMAL LOW (ref 36.0–46.0)
HEMOGLOBIN: 8.6 g/dL — AB (ref 12.0–15.0)
MCH: 26.2 pg (ref 26.0–34.0)
MCHC: 31 g/dL (ref 30.0–36.0)
MCV: 84.5 fL (ref 78.0–100.0)
Platelets: 225 10*3/uL (ref 150–400)
RBC: 3.28 MIL/uL — ABNORMAL LOW (ref 3.87–5.11)
RDW: 16.2 % — ABNORMAL HIGH (ref 11.5–15.5)
WBC: 3 10*3/uL — AB (ref 4.0–10.5)

## 2016-04-08 LAB — TSH: TSH: 1.573 u[IU]/mL (ref 0.350–4.500)

## 2016-04-08 MED ORDER — APIXABAN 2.5 MG PO TABS
2.5000 mg | ORAL_TABLET | Freq: Two times a day (BID) | ORAL | Status: DC
  Administered 2016-04-08 – 2016-04-09 (×3): 2.5 mg via ORAL
  Filled 2016-04-08: qty 1

## 2016-04-08 MED ORDER — ALPRAZOLAM 0.5 MG PO TABS
0.5000 mg | ORAL_TABLET | Freq: Three times a day (TID) | ORAL | Status: DC
  Administered 2016-04-08 – 2016-04-09 (×5): 0.5 mg via ORAL
  Filled 2016-04-08 (×8): qty 1

## 2016-04-08 MED ORDER — NEPRO/CARBSTEADY PO LIQD
237.0000 mL | Freq: Two times a day (BID) | ORAL | Status: DC
Start: 2016-04-08 — End: 2016-04-09

## 2016-04-08 MED ORDER — SEVELAMER CARBONATE 800 MG PO TABS: 1600.0000 mg | ORAL_TABLET | ORAL | Status: DC | PRN

## 2016-04-08 MED ORDER — LEVOFLOXACIN IN D5W 500 MG/100ML IV SOLN
500.0000 mg | INTRAVENOUS | Status: DC
  Filled 2016-04-08: qty 100

## 2016-04-08 MED ORDER — IPRATROPIUM-ALBUTEROL 0.5-2.5 (3) MG/3ML IN SOLN
3.0000 mL | Freq: Four times a day (QID) | RESPIRATORY_TRACT | Status: DC
Start: 2016-04-08 — End: 2016-04-09
  Administered 2016-04-08 – 2016-04-09 (×5): 3 mL via RESPIRATORY_TRACT
  Filled 2016-04-08 (×5): qty 3

## 2016-04-08 MED ORDER — ALBUTEROL SULFATE (2.5 MG/3ML) 0.083% IN NEBU
3.0000 mL | INHALATION_SOLUTION | RESPIRATORY_TRACT | Status: DC | PRN
  Filled 2016-04-08: qty 3

## 2016-04-08 MED ORDER — VITAMIN B-12 1000 MCG PO TABS
1000.0000 ug | ORAL_TABLET | Freq: Every day | ORAL | Status: DC
  Administered 2016-04-08: 1000 ug via ORAL
  Filled 2016-04-08: qty 1

## 2016-04-08 MED ORDER — VANCOMYCIN HCL 500 MG IV SOLR
500.0000 mg | INTRAVENOUS | Status: DC
  Administered 2016-04-09: 500 mg via INTRAVENOUS
  Filled 2016-04-08: qty 500

## 2016-04-08 MED ORDER — PIPERACILLIN-TAZOBACTAM 3.375 G IVPB 30 MIN
3.3750 g | Freq: Two times a day (BID) | INTRAVENOUS | Status: DC
  Filled 2016-04-08: qty 50

## 2016-04-08 MED ORDER — PIPERACILLIN-TAZOBACTAM 3.375 G IVPB
3.3750 g | Freq: Two times a day (BID) | INTRAVENOUS | Status: DC
  Administered 2016-04-08: 3.375 g via INTRAVENOUS
  Filled 2016-04-08: qty 50

## 2016-04-08 NOTE — Progress Notes (Signed)
Pt's BP 108/40 and HR 72.  Held 100 mg of Hydralazine PO.  Dr. Caryn Section paged and made aware.

## 2016-04-08 NOTE — Care Management Note (Signed)
Case Management Note  Patient Details  Name: Anita [name] MRN: [id number] Date of Birth: [birth year]  Subjective/Objective:                  Pt is from home, she lives with her husband and is ind with ADL's. She has home oxygen through [facility] she has walker, cane and BSC. She is on HD MWF at [facility] in [place]. She is active with [facility] for nursing services. She plans to return home with resumption of those services at [place]. Romualdo Bolk, of [facility], aware of admission and will obtain pt info from chart. Pt will need order to resume services at DC.    Action/Plan: Will cont to follow.   Expected Discharge Date:     04/09/2016             Expected Discharge Plan:  [place]  In-House Referral:  NA  Discharge planning Services  CM Consult  Post Acute Care Choice:  Home Health, Resumption of Svcs/PTA Provider Choice offered to:  Patient  DME Arranged:    DME Agency:     HH Arranged:  RN [place] Agency:  [place]  Status of Service:  In process, will continue to follow  If discussed at Long Length of Stay Meetings, dates discussed:    Additional Comments:  Sherald Barge, RN 04/08/2016, 1:19 PM

## 2016-04-08 NOTE — Progress Notes (Signed)
Pharmacy:  Patient on Apixaban 5mg  BID @ home for AFib.  She meets criteria for dose reduction (weight </= 60kg and Cr >/= 1.5).  Patient states her weight is generally below 132lbs (60kg), but does fluctuate some due to diet and/or timing of dialysis.  No bleeding complications noted, but she does have some bruising.  She recently received a 3 month supply of the 5mg  tablets from the manufacturer.  Plan: Discussed with Dr. Caryn Section. She will be transitioned to 2.5mg  PO BID while in hospital. Recommend continuing reduced dose at discharge.  Recommend new prescription for subsequent refills, but can cut 5mg  tablet in half if patient is unable to get a new supply from manufacturer with reduced dose. Discussed the importance of accurate dosing with the Anita [name].  [name], [name] 04/08/2016 8:58 AM

## 2016-04-08 NOTE — Progress Notes (Signed)
Pharmacy Antibiotic Note  Anita [name] is a 65 y.o. female admitted on 04/07/2016 with pneumonia.  Pharmacy has been consulted for Vancomycin and Zosyn dosing.  Plan: Zosyn 3.375g IV q12h (4 hour infusion). Vancomycin 500mg  IV every HD. Pre-Hemodialysis Vancomycin level goal range =15-25 mcg/ml Monitor labs, micro and vitals.  Patient also on Levaquin (adjusted for ESRD).  Height: 5' 6.5" (168.9 cm) Weight: 130 lb 1.1 oz (59 kg) IBW/kg (Calculated) : 60.45  Temp (24hrs), Avg:99.1 F (37.3 C), Min:98.7 F (37.1 C), Max:99.5 F (37.5 C)   Recent Labs Lab 04/07/16 1751 04/07/16 1909 04/08/16 0619  WBC 4.3  --  3.0*  CREATININE 2.94*  --  3.63*  LATICACIDVEN  --  0.91  --     Estimated Creatinine Clearance: 14.2 mL/min (by C-G formula based on SCr of 3.63 mg/dL).    Allergies  Allergen Reactions  . Doxycycline Nausea And Vomiting  . Iodine   . Lipitor [Atorvastatin] Nausea And Vomiting   Antimicrobials this admission: Vanc 8/21 >>  Zosyn 8/21 >>  Levaquin 8/22 >>  Dose adjustments this admission: ESRD Dosing  Microbiology results: None  Thank you for allowing pharmacy to be a part of this patient's care.  [name] 04/08/2016 9:03 AM

## 2016-04-08 NOTE — Care Management Obs Status (Signed)
[facility] NOTIFICATION   Patient Details  Name: Anita [name] MRN: [id number] Date of Birth: 1951/04/09   Medicare Observation Status Notification Given:  Yes    Sherald Barge, RN 04/08/2016, 1:23 PM

## 2016-04-08 NOTE — Progress Notes (Signed)
Initial Nutrition Assessment  INTERVENTION:   Nepro Shake po BID, each supplement provides 425 kcal and 19 grams protein   Recommend liberalize diet if feasible due to pt home diet is regular. Current diet order limits several of her usual food choices.  NUTRITION DIAGNOSIS:   Malnutrition related to poor appetite as evidenced by moderate depletion of body fat, moderate depletions of muscle mass.   GOAL:   Patient will meet greater than or equal to 90% of their needs  MONITOR:   Supplement acceptance, Labs, Weight trends, PO intake  REASON FOR ASSESSMENT:   Malnutrition Screening Tool    ASSESSMENT:   Anita [name] is a 65 y.o. female with medical history significant of ESRD on HD( MWF), HTN, PAF, and long term use of anticoagulants, presents to the ED via EMS with complaints of weakness and increase back pain and "pain everywhere" that onset a few days ago. She has been going to her normal dialysis treatments on Monday, Wednesday, and Friday. She has been receiving dialysis for the past 3 years.  Pt is c/o current diet restrictions. RD called by dietary staff to say pt is trying to order foods that are not allowed on the Renal CHO modified diet (such as bacon, double bread and extra gravy). RD offer to provide Renal diet education but patient says, "I know all that". Pt adamant that she eats a regular diet at home and takes binders to help manage her phosphorus levels. Pt says her protein status has improved according to her dialysis report. Dry weight is 59 kg. She has a hx of unplanned wt loss and severe malnutrition which was diagnosed back in June 2017. She is unable to cook and eats outside her home for most meals due to smell aversion. She says "I can't stand to smell the food cooking".   She has managed to maintain a stable weight the past 2 months but not eating well enough to improve to her usual weight range. She is taking an appetite stimulant now (unsure when she started  this). She likes Nepro and was encouraged to drink at least one daily even after discharge.   Recent Labs Lab 04/07/16 1751 04/08/16 0619  NA 131* 129*  K 3.7 4.3  CL 94* 94*  CO2 28 26  BUN 23* 33*  CREATININE 2.94* 3.63*  CALCIUM 8.2* 8.4*  GLUCOSE 92 160*   Labs: hyponatremia, chronic abnormal Creat. Meds/supplements: renal vitamin, megace  Diet Order:  Diet renal/carb modified with fluid restriction Diet-HS Snack? Nothing; Room service appropriate? Yes; Fluid consistency: Thin  Skin:  Reviewed, no issues  Last BM:  04/08/16  Height:   Ht Readings from Last 1 Encounters:  04/08/16 5' 6.5" (1.689 m)    Weight:   Wt Readings from Last 1 Encounters:  04/08/16 130 lb 1.1 oz (59 kg)    Ideal Body Weight:  57 kg  BMI:  Body mass index is 20.68 kg/m.  Estimated Nutritional Needs:   Kcal:  [id number]  Protein:  86-92 gr  Fluid:  1.2 liters daily  EDUCATION NEEDS:   Education needs addressed -pt refused diet education   Colman Cater MS,RD,CSG,LDN Office: [phone] Pager: [phone]

## 2016-04-08 NOTE — Progress Notes (Signed)
PROGRESS NOTE    Anita [name]  [id number] DOB: May 31, 1951 DOA: 04/07/2016 PCP: Robert Bellow, MD   Endocrinology: Jacelyn Pi, MD  Nephrology: Dr. Florene Glen or Dr. Justin Mend, MD  Arnoldo Lenis, MD  Brief Narrative:  Patient is a 65 year old with a history of oxygen dependent COPD, ESRD, HTN, PAF on Eliquis, diastolic heart failure, and aortic stenosis, who presented on 04/07/2016 with a complaint of fever, cough, and shortness of breath. In the ED, she was afebrile and hemodynamically stable. Her chest x-ray revealed right basilar opacity, cardiomegaly, and small bilateral pleural effusions. She was admitted for treatment of pneumonia.   Assessment & Plan:   Principal Problem:   PNA (pneumonia) Active Problems:   HCAP- Jan 2017   Chronic respiratory failure with hypoxia (HCC)   Aortic stenosis   Chronic diastolic heart failure (HCC)   Diabetic nephropathy (HCC)   Long-term (current) use of anticoagulants   PAF (paroxysmal atrial fibrillation) (HCC)   Elevated troponin   ESRD (end stage renal disease) ([place])   1. Pneumonia. The patient was started on Zosyn, Levaquin, and vancomycin. She had a history of HCAP 08/2005. 2. Chronic respiratory failure with hypoxia secondary to COPD. She is on 3 L nasal cannula oxygen chronically. She has no wheezes on exam. 3. Mild acute on chronic diastolic heart failure. She has some small pleural effusions on the chest x-ray. Her BNP is 1608 which may be falsely elevated due to her end-stage renal disease. 4. PAF, on Eliquis. Her rate is controlled with metoprolol and Cardizem. All were restarted. Her rate is controlled. 5. Diabetes mellitus with neuropathy/nephropathy/retinopathy. She is now diet-controlled. Her A1c was 4.7 in 01/2016. 6. Anemia of chronic disease. Her hemoglobin is stable. 7. Hypothyroidism. She was continued on her thyroid medication. Her TSH is within normal limits at 1.5. 8. Marginally elevated troponin I; not  consistent with ACS. 9. End-stage renal disease. Nephrology has been consulted. She is due for HD tomorrow. 10. Hyponatremia. Likely from end-stage renal disease.    DVT prophylaxis: Eliquis Code Status: Full code Family Communication: Discussed with husband Disposition Plan: Discharged to home in the next 24-48 hours   Consultants:   Nephrology  Procedures:   HD, pending  Antimicrobials:  Levaquin 04/08/16  Vancomycin 04/08/16  Zosyn 8/22>> 04/08/16    Subjective: She believes she is breathing better. She denies chest pain.  Objective: Vitals:   04/08/16 1042 04/08/16 1300 04/08/16 1408 04/08/16 1542  BP:  (!) 106/40  (!) 108/40  Pulse: 71 69  72  Resp:  20    Temp:  98.7 F (37.1 C)    TempSrc:  Oral    SpO2:  100% 98%   Weight:      Height:        Intake/Output Summary (Last 24 hours) at 04/08/16 1821 Last data filed at 04/08/16 1200  Gross per 24 hour  Intake              870 ml  Output                0 ml  Net              870 ml   Filed Weights   04/07/16 1658 04/08/16 0759  Weight: 59 kg (130 lb) 59 kg (130 lb 1.1 oz)    Examination:  General exam: Appears calm and comfortable  Respiratory system: Coarse breath sounds, occasional crackles in the bases. Respiratory effort normal. Cardiovascular system: S1 &  S2 heard with harsh 3/6 systolic murmur. No pedal edema. Gastrointestinal system: Abdomen is nondistended, soft and nontender. No organomegaly or masses felt. Normal bowel sounds heard. Central nervous system: Alert and oriented. No focal neurological deficits. Extremities: Symmetric 5 x 5 power. Skin: No rashes, lesions or ulcers Psychiatry: Judgement and insight appear normal. Mood & affect appropriate.     Data Reviewed: I have personally reviewed following labs and imaging studies  CBC:  Recent Labs Lab 04/07/16 1751 04/08/16 0619  WBC 4.3 3.0*  NEUTROABS 2.9  --   HGB 8.3* 8.6*  HCT 26.7* 27.7*  MCV 85.9 84.5  PLT 202 225     Basic Metabolic Panel:  Recent Labs Lab 04/07/16 1751 04/08/16 0619  NA 131* 129*  K 3.7 4.3  CL 94* 94*  CO2 28 26  GLUCOSE 92 160*  BUN 23* 33*  CREATININE 2.94* 3.63*  CALCIUM 8.2* 8.4*   GFR: Estimated Creatinine Clearance: 14.2 mL/min (by C-G formula based on SCr of 3.63 mg/dL). Liver Function Tests:  Recent Labs Lab 04/07/16 1751 04/08/16 0619  AST 17 23  ALT 5* 7*  ALKPHOS 49 53  BILITOT 0.7 0.4  PROT 6.4* 6.3*  ALBUMIN 2.8* 2.9*   No results for input(s): LIPASE, AMYLASE in the last 168 hours. No results for input(s): AMMONIA in the last 168 hours. Coagulation Profile: No results for input(s): INR, PROTIME in the last 168 hours. Cardiac Enzymes:  Recent Labs Lab 04/07/16 1751  TROPONINI 0.04*   BNP (last 3 results) No results for input(s): PROBNP in the last 8760 hours. HbA1C: No results for input(s): HGBA1C in the last 72 hours. CBG: No results for input(s): GLUCAP in the last 168 hours. Lipid Profile: No results for input(s): CHOL, HDL, LDLCALC, TRIG, CHOLHDL, LDLDIRECT in the last 72 hours. Thyroid Function Tests:  Recent Labs  04/07/16 1750  TSH 1.573   Anemia Panel: No results for input(s): VITAMINB12, FOLATE, FERRITIN, TIBC, IRON, RETICCTPCT in the last 72 hours. Sepsis Labs:  Recent Labs Lab 04/07/16 1909  LATICACIDVEN 0.91    No results found for this or any previous visit (from the past 240 hour(s)).       Radiology Studies: Dg Chest 2 View  Result Date: 04/07/2016 CLINICAL DATA:  Shortness of breath with chest pain and congestion after dialysis. EXAM: CHEST  2 VIEW COMPARISON:  12/30/2015 FINDINGS: Heart is enlarged. There is interstitial pulmonary edema. Small bilateral pleural effusions are present. More focal opacity at the right lung base may be related to infectious infiltrate versus chronic changes. There has been opacity at the right lung base on numerous prior imaging studies. IMPRESSION: 1. Cardiomegaly and  interstitial edema. 2. Small bilateral pleural effusions. 3. Right basilar opacity consistent with with acute versus chronic infiltrate. Electronically Signed   By: Nolon Nations M.D.   On: 04/07/2016 17:38        Scheduled Meds: . ALPRAZolam  0.5 mg Oral TID  . apixaban  2.5 mg Oral BID  . diltiazem  240 mg Oral Daily  . feeding supplement (NEPRO CARB STEADY)  237 mL Oral BID BM  . hydrALAZINE  100 mg Oral TID  . ipratropium-albuterol  3 mL Nebulization Q6H  . [START ON 04/10/2016] levofloxacin (LEVAQUIN) IV  500 mg Intravenous Q48H  . megestrol  400 mg Oral Daily  . metoprolol tartrate  12.5 mg Oral BID  . multivitamin  1 tablet Oral QHS  . pantoprazole  40 mg Oral Daily  . piperacillin-tazobactam (  ZOSYN)  IV  3.375 g Intravenous Q12H  . polyethylene glycol  17 g Oral Daily  . sevelamer carbonate  2,400 mg Oral TID WC  . Thyroid  243.75 mg Oral Daily  . [START ON 04/09/2016] vancomycin  500 mg Intravenous Q M,W,F-HD  . vitamin B-12  1,000 mcg Oral Q2000  . zolpidem  5 mg Oral QHS   Continuous Infusions:    LOS: 0 days    Time spent: 60 minutes    Rexene Alberts, MD [facility] Pager [phone]  If 7PM-7AM, please contact night-coverage [email or web address] Password [facility] 04/08/2016, 6:21 PM

## 2016-04-08 NOTE — Progress Notes (Signed)
Received report from ED nurse regarding patient coming to rm [id number].

## 2016-04-08 NOTE — Progress Notes (Signed)
Patient arrived to unit. BP elevated. Medications given.  Pt states pain 8/10. Pain medication given. Order/chart reviewed. RN will continue to monitor. Oswald Hillock, RN

## 2016-04-08 NOTE — Evaluation (Signed)
Physical Therapy Evaluation Patient Details Name: Anita [name] MRN: [id number] DOB: Aug 03, 1951 Today's Date: 04/08/2016   History of Present Illness  65 y.o. female with medical history significant of ESRD on HD( MWF), HTN, PAF, and long term use of anticoagulants, presents to the ED via EMS with complaints of weakness and increase back pain and "pain everywhere" that onset a few days ago. She has been going to her normal dialysis treatments on Monday, Wednesday, and Friday. She admits to some pain in her back. She also admits to chills but denies any phlegm.  DX: PNA.  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  Clinical Impression  Pt received in bed, and was agreeable to PT evaluation, however she expressed that she was in a lot of pain.  RN called and requested pain medication.  Pt states she is independent at home with ambulation, and ADL's, except she does not cook because she can't stand the smell of foods.  Pt is a minimal community ambulator at baseline.  Today, she ambulated 45ft Mod (I) - pushing O2 carrier.  Pt expressed that is better than what she has been doing at home.  SpO2 remained 91-98% on 3L during ambulation.  Pt expressed that she does not want any HHPT because she has done it before.  She feels that she can do the things they did on her own.  Will continue PT in acute care setting, however, agree that I do not feel that she would need HHPT upon d/c.      Follow Up Recommendations No PT follow up    Equipment Recommendations  None recommended by PT    Recommendations for Other  Services       Precautions / Restrictions Restrictions Weight Bearing Restrictions: No      Mobility  Bed Mobility Overal bed mobility: Modified Independent             General bed mobility comments: HOB raised - but pt has an adjustable bed at home.   Transfers Overall transfer level: Modified independent Equipment used: None Transfers: Sit to/from [employer] Sit to Stand: Modified independent (Device/Increase time) Stand pivot transfers: Modified independent (Device/Increase time)          Ambulation/Gait Ambulation/Gait assistance: Modified independent (Device/Increase time) Ambulation Distance (Feet): 60 Feet Assistive device:  (Pt pushing O2 carrier. ) Gait Pattern/deviations: WFL(Within Functional Limits)     General Gait Details: No LOB, pt limited due to poor endurance.  Pt ambulated on 3L of O2, and SpO2 was 91% at end of gait.   Stairs            Wheelchair Mobility    Modified Rankin (Stroke Patients Only)       Balance Overall balance assessment: Modified Independent                                           Pertinent Vitals/Pain Pain Assessment: 0-10 Pain Score: 8  Pain Location: back- tailbone Pain Descriptors / Indicators: Aching;Sharp Pain Intervention(s): Limited activity within patient's tolerance;Monitored during session  Home Living   Living Arrangements: Spouse/significant other Available Help at Discharge: Other (Comment) (housekeeper) Type of Home: House Home Access: Stairs to enter   [employer] of Steps: 1 Home Layout: One level Home Equipment: [place] - 2 wheels;Cane - single point;Bedside commode;Other (comment) (Home O2, and adjustable bed. )      Prior Function     Gait / Transfers Assistance Needed: Pt states she is independent with ambulation, but she gets SOB.   ADL's / Homemaking Assistance Needed: Pt doesn't do much cooking because of the smell of food.   She is interested in [place].  pt is independent with dressing and bathing.  Husband drives.  Pt states she gets out into the community a small amount for groceries and errands.  neighbor also helps transport her to [employer] or groceries.          Hand Dominance   Dominant Hand: Right    Extremity/Trunk Assessment   Upper Extremity Assessment: Overall WFL for tasks assessed           Lower Extremity Assessment: Overall WFL for tasks assessed         Communication   Communication: No difficulties  Cognition Arousal/Alertness: Awake/alert Behavior During Therapy: WFL for tasks assessed/performed Overall Cognitive Status: Within Functional Limits for tasks assessed                      General Comments      Exercises        Assessment/Plan    PT Assessment Patent does not need any further PT services  PT Diagnosis Difficulty walking   PT Problem List    PT Treatment Interventions     PT Goals (Current goals can be found in the Care Plan section) Acute Rehab PT Goals Patient Stated Goal: Pt wants to go home.  PT Goal Formulation: With patient Time For Goal Achievement: 04/15/16 Potential to Achieve Goals: Good    Frequency     Barriers to discharge        Co-evaluation               End of Session Equipment Utilized During Treatment: Gait belt Activity Tolerance: Patient limited by fatigue;Patient limited by pain Patient left: in chair;with call bell/phone within reach Nurse Communication: Mobility status    Functional Assessment Tool Used: [employer] "6-clicks"  Functional Limitation: Mobility: Walking and moving around Mobility: Walking and Moving Around Current Status [phone]): At least 20 percent but less than 40 percent impaired, limited or restricted Mobility: Walking and Moving Around Goal Status [phone]): At least 1 percent but less than 20 percent impaired, limited or restricted    Time: 1011-1036 PT Time  Calculation (min) (ACUTE ONLY): 25 min   Charges:   PT Evaluation $PT Eval Low Complexity: 1 Procedure PT Treatments $Gait Training: 8-22 mins   PT G Codes:   PT G-Codes **NOT FOR INPATIENT CLASS** Functional Assessment Tool Used: [employer] "6-clicks"  Functional Limitation: Mobility: Walking and moving around Mobility: Walking and Moving Around Current Status [phone]): At least 20 percent but less than 40 percent impaired, limited or restricted Mobility: Walking and Moving Around Goal Status [phone]): At least 1 percent but less than 20 percent impaired, limited or restricted   Beth Robi Dewolfe, PT, DPT X: [phone]

## 2016-04-08 NOTE — Consult Note (Signed)
Reason for Consult: End-stage renal disease Referring Physician: Dr. Sonnie Alamo Anita [name] is an 65 y.o. female.  HPI: She is a patient with well-known to me who has history of coronary artery disease, COPD, diabetes, aortic stenosis and end-stage renal disease on maintenance hemodialysis presently she came with complaints of weakness, feeling tired and some back pain. When she was evaluated patient was found to have anemia and right basilar infiltrate and admitted to the hospital. Presently patient denies any nausea or vomiting. Patient states that she is feeling okay he should like to go home. She had her dialysis yesterday and was able to bring her down to her estimated target weight.  Past Medical History:  Diagnosis Date  . Anemia   . Aortic stenosis 12/23/2012  . Atherosclerosis of aorta ([place])   . Atrial fibrillation ([place])    Diagnosed 11/14 of undetermined age of onset    . CAD (coronary artery disease) 07/13/2013   Catheterization 5 years ago by Dr. Elisabeth Cara with reportedly nonobstructive disease Calcification noted on CT scan of chest in November of 2014   . CHF (congestive heart failure) ([place])   . Chronic diastolic heart failure ([place])   . COPD (chronic obstructive pulmonary disease) ([place])   . Diabetes mellitus with end stage renal disease ([place])   . Diabetic nephropathy ([place])   . Diabetic neuropathy ([place])   . Dyslipidemia   . ESRD on hemodialysis ([place])   . GERD (gastroesophageal reflux disease)   . Hypertension   . Laryngeal mass   . Neuropathy due to secondary diabetes ([place])   . PONV (postoperative nausea and vomiting)   . 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 Shortness of breath dyspnea    with exertion  . Sleep apnea    Does not use CPAP- due to weight loss  . Thyroid neoplasm    of uncertain behavior  . Wears glasses     Past Surgical History:  Procedure Laterality Date  . ACHILLES TENDON REPAIR Right   . APPENDECTOMY    . AV FISTULA PLACEMENT Left 12/29/2012    Procedure: ARTERIOVENOUS (AV) FISTULA CREATION;  Surgeon: Elam Dutch, MD;  [facility];  Service: Vascular;  Laterality: Left;  Creation Left Brachial Cephalic Arteriovenous Fistula  . CARDIAC CATHETERIZATION    . CESAREAN SECTION     X 2   . CHOLECYSTECTOMY    . Elbow tendon surgery Right   . HEMORRHOID SURGERY     many years ago  . LARYNGECTOMY     mass removed- noncancerous  . LUMBAR LAMINECTOMY     lower back  . SHOULDER ARTHROSCOPY Right    w/ repair of rotator cuff repair  . THYROIDECTOMY N/A 10/19/2014   Procedure: TOTAL THYROIDECTOMY;  Surgeon: Armandina Gemma, MD;  Location: [facility] OR;  Service: General;  Laterality: N/A;    Family History  Problem Relation Age of Onset  . COPD Mother   . Thyroid disease Mother   . Multiple sclerosis Father   . Heart disease Father   . Heart attack Father   . Depression Brother     Suicide  . Alcohol abuse Brother   . Heart disease Brother   . Asthma Brother   . Thyroid disease Daughter   . Diabetes Maternal Aunt     Social History:  reports that she has been smoking Cigarettes and E-cigarettes.  She started smoking about 46 years ago. She has a 20.00 pack-year smoking history. She has never  used smokeless tobacco. She reports that she does not drink alcohol or use drugs.  Allergies:  Allergies  Allergen Reactions  . Doxycycline Nausea And Vomiting  . Iodine   . Lipitor [Atorvastatin] Nausea And Vomiting    Medications: I have reviewed the patient's current medications.  Results for orders placed or performed during the hospital encounter of 04/07/16 (from the past 48 hour(s))  TSH     Status: None   Collection Time: 04/07/16  5:50 PM  Result Value Ref Range   TSH 1.573 0.350 - 4.500 uIU/mL  CBC with Differential/Platelet     Status: Abnormal   Collection Time: 04/07/16  5:51 PM  Result Value Ref Range   WBC 4.3 4.0 - 10.5 K/uL   RBC 3.11 (L) 3.87 - 5.11 MIL/uL   Hemoglobin 8.3 (L) 12.0 - 15.0 g/dL   HCT 26.7 (L)  36.0 - 46.0 %   MCV 85.9 78.0 - 100.0 fL   MCH 26.7 26.0 - 34.0 pg   MCHC 31.1 30.0 - 36.0 g/dL   RDW 16.3 (H) 11.5 - 15.5 %   Platelets 202 150 - 400 K/uL   Neutrophils Relative % 68 %   Neutro Abs 2.9 1.7 - 7.7 K/uL   Lymphocytes Relative 23 %   Lymphs Abs 1.0 0.7 - 4.0 K/uL   Monocytes Relative 8 %   Monocytes Absolute 0.4 0.1 - 1.0 K/uL   Eosinophils Relative 1 %   Eosinophils Absolute 0.0 0.0 - 0.7 K/uL   Basophils Relative 0 %   Basophils Absolute 0.0 0.0 - 0.1 K/uL  Comprehensive metabolic panel     Status: Abnormal   Collection Time: 04/07/16  5:51 PM  Result Value Ref Range   Sodium 131 (L) 135 - 145 mmol/L   Potassium 3.7 3.5 - 5.1 mmol/L   Chloride 94 (L) 101 - 111 mmol/L   CO2 28 22 - 32 mmol/L   Glucose, Bld 92 65 - 99 mg/dL   BUN 23 (H) 6 - 20 mg/dL   Creatinine, Ser 2.94 (H) 0.44 - 1.00 mg/dL   Calcium 8.2 (L) 8.9 - 10.3 mg/dL   Total Protein 6.4 (L) 6.5 - 8.1 g/dL   Albumin 2.8 (L) 3.5 - 5.0 g/dL   AST 17 15 - 41 U/L   ALT 5 (L) 14 - 54 U/L   Alkaline Phosphatase 49 38 - 126 U/L   Total Bilirubin 0.7 0.3 - 1.2 mg/dL   GFR calc non Af Amer 16 (L) >60 mL/min   GFR calc Af Amer 18 (L) >60 mL/min    Comment: (NOTE) The eGFR has been calculated using the CKD EPI equation. This calculation has not been validated in all clinical situations. eGFR's persistently <60 mL/min signify possible Chronic Kidney Disease.    Anion gap 9 5 - 15  Brain natriuretic peptide     Status: Abnormal   Collection Time: 04/07/16  5:51 PM  Result Value Ref Range   B Natriuretic Peptide 1,608.0 (H) 0.0 - 100.0 pg/mL  Troponin I     Status: Abnormal   Collection Time: 04/07/16  5:51 PM  Result Value Ref Range   Troponin I 0.04 (HH) <0.03 ng/mL    Comment: CRITICAL RESULT CALLED TO, READ BACK BY AND VERIFIED WITH: TALBET,T AT 2050 ON 04/07/2016 BY ISLEY,B   I-Stat CG4 Lactic Acid, ED     Status: None   Collection Time: 04/07/16  7:09 PM  Result Value Ref Range   Lactic Acid,  Venous 0.91 0.5 - 1.9 mmol/L    Dg Chest 2 View  Result Date: 04/07/2016 CLINICAL DATA:  Shortness of breath with chest pain and congestion after dialysis. EXAM: CHEST  2 VIEW COMPARISON:  12/30/2015 FINDINGS: Heart is enlarged. There is interstitial pulmonary edema. Small bilateral pleural effusions are present. More focal opacity at the right lung base may be related to infectious infiltrate versus chronic changes. There has been opacity at the right lung base on numerous prior imaging studies. IMPRESSION: 1. Cardiomegaly and interstitial edema. 2. Small bilateral pleural effusions. 3. Right basilar opacity consistent with with acute versus chronic infiltrate. Electronically Signed   By: Nolon Nations M.D.   On: 04/07/2016 17:38    Review of Systems  Constitutional: Positive for chills and malaise/fatigue. Negative for fever.  Respiratory: Negative for cough, sputum production and shortness of breath.   Cardiovascular: Negative for chest pain, orthopnea and leg swelling.  Musculoskeletal: Positive for back pain.  Neurological: Positive for weakness.   Blood pressure (!) 149/61, pulse 79, temperature 99.1 F (37.3 C), temperature source Oral, resp. rate 20, height 5' 6.5" (1.689 m), weight 59 kg (130 lb 1.1 oz), SpO2 100 %. Physical Exam  Constitutional: She is oriented to person, place, and time. No distress.  Patient on nasal oxygen  Neck: JVD present.  Cardiovascular: Normal rate and regular rhythm.   Respiratory: No respiratory distress. She has no wheezes.  GI: She exhibits no distension.  Musculoskeletal: She exhibits no edema.  Neurological: She is alert and oriented to person, place, and time.    Assessment/Plan: Problem #1 weakness: Possibly related to her anemia her hemoglobin is low. Problem #2 end-stage renal disease: She status post hemodialysis yesterday. Presently she doesn't have any uremic signs and symptoms. Problem #3 history of diabetes Problem #4 history of  coronary artery disease Problem #5 history of diabetes Problem# 6 hypertension: Her blood pressure seems to be reasonably controlled Problem #7 metabolic bone disease. Calcium is range Plan: We will make arrangements for patient to get dialysis tomorrow which is her regular schedule if patient is going to stay in the hospital. We'll dialyze her for 31/2 hours minutes We'll try to remove about 2 L if she tolerates. Patient complains of leg cramp on dialysis and usually unable to remove much fluid. We'll use Epogen 10,000 units IV after each dialysis We'll check her renal panel and CBC in the morning  Sandria Mcenroe S 04/08/2016, 8:01 AM

## 2016-04-09 DIAGNOSIS — J9611 Chronic respiratory failure with hypoxia: Secondary | ICD-10-CM

## 2016-04-09 DIAGNOSIS — J189 Pneumonia, unspecified organism: Secondary | ICD-10-CM

## 2016-04-09 LAB — RENAL FUNCTION PANEL
ANION GAP: 12 (ref 5–15)
Albumin: 3 g/dL — ABNORMAL LOW (ref 3.5–5.0)
BUN: 70 mg/dL — ABNORMAL HIGH (ref 6–20)
CHLORIDE: 90 mmol/L — AB (ref 101–111)
CO2: 30 mmol/L (ref 22–32)
CREATININE: 5.13 mg/dL — AB (ref 0.44–1.00)
Calcium: 8.3 mg/dL — ABNORMAL LOW (ref 8.9–10.3)
GFR, EST AFRICAN AMERICAN: 9 mL/min — AB (ref >60)
GFR, EST NON AFRICAN AMERICAN: 8 mL/min — AB (ref >60)
Glucose, Bld: 131 mg/dL — ABNORMAL HIGH (ref 65–99)
Phosphorus: 7.4 mg/dL — ABNORMAL HIGH (ref 2.5–4.6)
Potassium: 4.5 mmol/L (ref 3.5–5.1)
Sodium: 132 mmol/L — ABNORMAL LOW (ref 135–145)

## 2016-04-09 LAB — CBC
HEMATOCRIT: 27.7 % — AB (ref 36.0–46.0)
HEMOGLOBIN: 8.6 g/dL — AB (ref 12.0–15.0)
MCH: 26.5 pg (ref 26.0–34.0)
MCHC: 31 g/dL (ref 30.0–36.0)
MCV: 85.2 fL (ref 78.0–100.0)
PLATELETS: 294 10*3/uL (ref 150–400)
RBC: 3.25 MIL/uL — AB (ref 3.87–5.11)
RDW: 16.6 % — ABNORMAL HIGH (ref 11.5–15.5)
WBC: 7.7 10*3/uL (ref 4.0–10.5)

## 2016-04-09 LAB — GLUCOSE, CAPILLARY: Glucose-Capillary: 148 mg/dL — ABNORMAL HIGH (ref 65–99)

## 2016-04-09 MED ORDER — APIXABAN 2.5 MG PO TABS: 2.5000 mg | ORAL_TABLET | Freq: Two times a day (BID) | ORAL | 2 refills | Status: DC

## 2016-04-09 MED ORDER — LIDOCAINE HCL (PF) 1 % IJ SOLN: 5.0000 mL | INTRAMUSCULAR | Status: DC | PRN

## 2016-04-09 MED ORDER — LIDOCAINE-PRILOCAINE 2.5-2.5 % EX CREA: 1.0000 "application " | TOPICAL_CREAM | CUTANEOUS | Status: DC | PRN

## 2016-04-09 MED ORDER — PENTAFLUOROPROP-TETRAFLUOROETH EX AERO: 1.0000 "application " | INHALATION_SPRAY | CUTANEOUS | Status: DC | PRN

## 2016-04-09 MED ORDER — MEGESTROL ACETATE 400 MG/10ML PO SUSP
400.0000 mg | Freq: Every day | ORAL | Status: DC
  Filled 2016-04-09 (×2): qty 10

## 2016-04-09 MED ORDER — SODIUM CHLORIDE 0.9 % IV SOLN: 100.0000 mL | INTRAVENOUS | Status: DC | PRN

## 2016-04-09 MED ORDER — HEPARIN SODIUM (PORCINE) 1000 UNIT/ML DIALYSIS: 1000.0000 [IU] | INTRAMUSCULAR | Status: DC | PRN

## 2016-04-09 MED ORDER — ALTEPLASE 2 MG IJ SOLR: 2.0000 mg | Freq: Once | INTRAMUSCULAR | Status: DC | PRN

## 2016-04-09 MED ORDER — LEVOFLOXACIN 500 MG PO TABS: 500.0000 mg | ORAL_TABLET | ORAL | 0 refills | Status: DC

## 2016-04-09 NOTE — Progress Notes (Signed)
Anita [name]  MRN: [id number]  DOB/AGE: [birth year] 65 y.o.  Primary Care Physician:Anita Durel Salts, MD  Admit date: 04/07/2016  Chief Complaint:  Chief Complaint  Patient presents with  . Pneumonia    S-Pt presented on  04/07/2016 with  Chief Complaint  Patient presents with  . Pneumonia  .    Pt main concern is back ache.  Meds  . ALPRAZolam  0.5 mg Oral TID  . apixaban  2.5 mg Oral BID  . diltiazem  240 mg Oral Daily  . feeding supplement (NEPRO CARB STEADY)  237 mL Oral BID BM  . hydrALAZINE  100 mg Oral TID  . ipratropium-albuterol  3 mL Nebulization Q6H  . [START ON 04/10/2016] levofloxacin (LEVAQUIN) IV  500 mg Intravenous Q48H  . megestrol  400 mg Oral Daily  . metoprolol tartrate  12.5 mg Oral BID  . multivitamin  1 tablet Oral QHS  . pantoprazole  40 mg Oral Daily  . polyethylene glycol  17 g Oral Daily  . sevelamer carbonate  2,400 mg Oral TID WC  . Thyroid  243.75 mg Oral Daily  . vancomycin  500 mg Intravenous Q M,W,F-HD  . vitamin B-12  1,000 mcg Oral Q2000  . zolpidem  5 mg Oral QHS      Physical Exam: Vital signs in last 24 hours: Temp:  [98.6 F (37 C)-99.1 F (37.3 C)] 98.6 F (37 C) (08/22 2113) Pulse Rate:  [69-79] 73 (08/22 2113) Resp:  [18-20] 18 (08/22 2113) BP: (106-149)/(40-61) 107/42 (08/22 2113) SpO2:  [94 %-100 %] 94 % (08/23 0233) Weight:  [130 lb 1.1 oz (59 kg)] 130 lb 1.1 oz (59 kg) (08/22 0759) Weight change: 1.1 oz (0.032 kg) Last BM Date: 04/08/16  Intake/Output from previous day: 08/22 0701 - 08/23 0700 In: 770 [P.O.:720; IV Piggyback:50] Out: -  No intake/output data recorded.   Physical Exam: General- pt is awake,alert, oriented to time place and person Resp- No acute REsp distress, decreased at bases. CVS- S1S2 irregular in rate and rhythm GIT- BS+, soft, NT, ND EXT- NO LE Edema, Cyanosis Acces- AVF   Lab Results: CBC  Recent Labs  04/07/16 1751 04/08/16 0619  WBC 4.3 3.0*  HGB 8.3* 8.6*  HCT  26.7* 27.7*  PLT 202 225    BMET  Recent Labs  04/07/16 1751 04/08/16 0619  NA 131* 129*  K 3.7 4.3  CL 94* 94*  CO2 28 26  GLUCOSE 92 160*  BUN 23* 33*  CREATININE 2.94* 3.63*  CALCIUM 8.2* 8.4*    MICRO No results found for this or any previous visit (from the past 240 hour(s)).    Lab Results  Component Value Date   CALCIUM 8.4 (L) 04/08/2016   PHOS 6.6 (H) 02/16/2016               Impression: 1)Renal  ESRD on HD                Pt is on Monday/Wed/Friday schedule                Will dialyze pt today  2)HTN  Medication- On Calcium Channel Blockers On Beta blockers On Vasodilators-    3)Anemia IN ESRD the goal for HGB is 9--11. will keep on epo during HD   4)CKD Mineral-Bone Disorder  Phosphorus not at goal.    On binders  Calcium when corrected for low albumin is at goal.  5)ID-admitted with pneumonia On antibiotics PMD following  6)Electrolytes  Normokalemic  Hyponatremic     Sec to ESRD   7)Acid base Co2 at goal   8) CVS-  Afib                Aortic stenosis                      On Anticoagulation   Plan:   Will dialyze today      Anita [name] S 04/09/2016, 5:12 AM

## 2016-04-09 NOTE — Progress Notes (Signed)
Hemodialysis- Pt tolerated well without issue. 2L uf as per orders. Post HD Bp 134/60. Report called to primary RN. Pt currently has no complaints.

## 2016-04-09 NOTE — Discharge Summary (Signed)
Physician Discharge Summary  Anita [name] [id number] DOB: [birth year] DOA: 04/07/2016  PCP: Anita Bellow, MD  Admit date: 04/07/2016 Discharge date: 04/09/2016  Time spent: 45 minutes  Recommendations for Outpatient Follow-up:  -Will be discharged home today. -Advised to follow up with PCP in 2 weeks.   Discharge Diagnoses:  Principal Problem:   PNA (pneumonia) Active Problems:   Aortic stenosis   Chronic diastolic heart failure (HCC)   Diabetic nephropathy (HCC)   Long-term (current) use of anticoagulants   PAF (paroxysmal atrial fibrillation) ([place])   HCAP- Jan 2017   Elevated troponin   ESRD (end stage renal disease) ([place])   Chronic respiratory failure with hypoxia ([place])   Hypothyroidism   Discharge Condition: Stable and improved  Filed Weights   04/09/16 0601 04/09/16 0825 04/09/16 1125  Weight: 59.2 kg (130 lb 8.2 oz) 59.6 kg (131 lb 6.3 oz) 57.6 kg (126 lb 15.8 oz)    History of present illness:  As per Dr. Marin Comment on 8/21: Anita [name] is a 65 y.o. female with medical history significant of ESRD on HD( MWF), HTN, PAF, and long term use of anticoagulants, presents to the ED via EMS with complaints of weakness and increase back pain and "pain everywhere" that onset a few days ago. She has been going to her normal dialysis treatments on Monday, Wednesday, and Friday. She admits to some pain in her back. She also admits to chills but denies any phlegm.  ED Course: While in the ED, sodium 131, BUN 23, Cr 2.94, ALT 5, BNP 1,608, Troponin 0.04, Lactic acid 0.91, and Hgb 8.3. CXR showed cardiomegaly and interstitial edema along with right basilar opacity consistent with acute versus chronic infiltrate. EKG shows sinus rhythm.  She was given IV steroid, and IV Van/Zosyn and hospitalsit was asked to admit  Her for further management of her PNA.   Hospital Course:   1. Pneumonia. Has responded well to treatment. All cx data remains negative. Will DC on a 10 day  course of levaquin (abx adjusted for ESRD status). 2. Chronic respiratory failure with hypoxia secondary to COPD. She is on 3 L nasal cannula oxygen chronically. She has no wheezes on exam. 3. Mild acute on chronic diastolic heart failure. She has some small pleural effusions on the chest x-ray. Her BNP is 1608 which may be falsely elevated due to her end-stage renal disease. 4. PAF, on Eliquis. Her rate is controlled with metoprolol and Cardizem. All were restarted. Her rate is controlled. 5. Diabetes mellitus with neuropathy/nephropathy/retinopathy. She is now diet-controlled. Her A1c was 4.7 in 01/2016. 6. Anemia of chronic disease. Her hemoglobin is stable. 7. Hypothyroidism. She was continued on her thyroid medication. Her TSH is within normal limits at 1.5. 8. Marginally elevated troponin I; not consistent with ACS. 9. End-stage renal disease. HD as scheduled 10. Hyponatremia. Likely from end-stage renal disease.   Procedures:  Renal   Consultations:  Dialysis  Discharge Instructions  Discharge Instructions    Diet - low sodium heart healthy    Complete by:  As directed   Increase activity slowly    Complete by:  As directed       Medication List    STOP taking these medications   glycerin (Pediatric) 1.2 g Supp     TAKE these medications   ALPRAZolam 1 MG tablet Commonly known as:  XANAX Take 0.5-1 mg by mouth 4 (four) times daily. Patient takes 1/2 tablet 2-3 times daily and 1  tablet at bedtime   apixaban 2.5 MG Tabs tablet Commonly known as:  ELIQUIS Take 1 tablet (2.5 mg total) by mouth 2 (two) times daily. What changed:  medication strength  how much to take   bisacodyl 10 MG suppository Commonly known as:  DULCOLAX Place 1 suppository (10 mg total) rectally daily as needed for moderate constipation.   diltiazem 240 MG 24 hr capsule Commonly known as:  CARDIZEM CD TAKE (1) CAPSULE BY MOUTH AT BEDTIME.   hydrALAZINE 100 MG tablet Commonly known as:   APRESOLINE Take 100 mg by mouth 3 (three) times daily.   HYDROcodone-acetaminophen 7.5-325 MG tablet Commonly known as:  NORCO Take 1 tablet by mouth every 6 (six) hours as needed for pain. What changed:  how much to take  reasons to take this   ipratropium-albuterol 0.5-2.5 (3) MG/3ML Soln Commonly known as:  DUONEB Take 3 mLs by nebulization every 4 (four) hours as needed. What changed:  when to take this  additional instructions   levofloxacin 500 MG tablet Commonly known as:  LEVAQUIN Take 1 tablet (500 mg total) by mouth every other day.   meclizine 25 MG tablet Commonly known as:  ANTIVERT Take 25 mg by mouth 4 (four) times daily as needed for dizziness.   megestrol 40 MG/ML suspension Commonly known as:  MEGACE Take 400 mg by mouth daily.   metoprolol tartrate 25 MG tablet Commonly known as:  LOPRESSOR Take 0.5 tablets (12.5 mg total) by mouth 2 (two) times daily.   multivitamin Tabs tablet Take 1 tablet by mouth at bedtime.   nitroGLYCERIN 0.4 MG SL tablet Commonly known as:  NITROSTAT Place 1 tablet (0.4 mg total) under the tongue every 5 (five) minutes as needed for chest pain.   omeprazole 20 MG capsule Commonly known as:  PRILOSEC Take 20 mg by mouth daily as needed (acid reflux).   ondansetron 4 MG tablet Commonly known as:  ZOFRAN Take 4 mg by mouth every 8 (eight) hours as needed for nausea or vomiting.   polyethylene glycol packet Commonly known as:  MIRALAX / GLYCOLAX Take 17 g by mouth daily.   PROAIR HFA 108 (90 Base) MCG/ACT inhaler Generic drug:  albuterol Inhale 1-2 puffs into the lungs every 6 (six) hours as needed for wheezing or shortness of breath.   sevelamer carbonate 800 MG tablet Commonly known as:  RENVELA Take 1,600-2,400 mg by mouth 3 (three) times daily with meals. 3 with meals and 2 with snacks   Thyroid 97.5 MG Tabs Take 243.75 mg by mouth daily. 2 and 1/2 tablet once daily   VITAMIN B 12 PO Take 1,000 mg by mouth  daily.   zolpidem 10 MG tablet Commonly known as:  AMBIEN Take 10 mg by mouth at bedtime.      Allergies  Allergen Reactions  . Doxycycline Nausea And Vomiting  . Iodine   . Lipitor [Atorvastatin] Nausea And Vomiting      The results of significant diagnostics from this hospitalization (including imaging, microbiology, ancillary and laboratory) are listed below for reference.    Significant Diagnostic Studies: Dg Chest 2 View  Result Date: 04/07/2016 CLINICAL DATA:  Shortness of breath with chest pain and congestion after dialysis. EXAM: CHEST  2 VIEW COMPARISON:  12/30/2015 FINDINGS: Heart is enlarged. There is interstitial pulmonary edema. Small bilateral pleural effusions are present. More focal opacity at the right lung base may be related to infectious infiltrate versus chronic changes. There has been opacity at the right  lung base on numerous prior imaging studies. IMPRESSION: 1. Cardiomegaly and interstitial edema. 2. Small bilateral pleural effusions. 3. Right basilar opacity consistent with with acute versus chronic infiltrate. Electronically Signed   By: Nolon Nations M.D.   On: 04/07/2016 17:38    Microbiology: No results found for this or any previous visit (from the past 240 hour(s)).   Labs: Basic Metabolic Panel:  Recent Labs Lab 04/07/16 1751 04/08/16 0619 04/09/16 0601  NA 131* 129* 132*  K 3.7 4.3 4.5  CL 94* 94* 90*  CO2 28 26 30   GLUCOSE 92 160* 131*  BUN 23* 33* 70*  CREATININE 2.94* 3.63* 5.13*  CALCIUM 8.2* 8.4* 8.3*  PHOS  --   --  7.4*   Liver Function Tests:  Recent Labs Lab 04/07/16 1751 04/08/16 0619 04/09/16 0601  AST 17 23  --   ALT 5* 7*  --   ALKPHOS 49 53  --   BILITOT 0.7 0.4  --   PROT 6.4* 6.3*  --   ALBUMIN 2.8* 2.9* 3.0*   No results for input(s): LIPASE, AMYLASE in the last 168 hours. No results for input(s): AMMONIA in the last 168 hours. CBC:  Recent Labs Lab 04/07/16 1751 04/08/16 0619 04/09/16 0557  WBC  4.3 3.0* 7.7  NEUTROABS 2.9  --   --   HGB 8.3* 8.6* 8.6*  HCT 26.7* 27.7* 27.7*  MCV 85.9 84.5 85.2  PLT 202 225 294   Cardiac Enzymes:  Recent Labs Lab 04/07/16 1751  TROPONINI 0.04*   BNP: BNP (last 3 results)  Recent Labs  10/22/15 1244 12/30/15 2102 04/07/16 1751  BNP 2,971.2* 2,726.0* 1,608.0*    ProBNP (last 3 results) No results for input(s): PROBNP in the last 8760 hours.  CBG:  Recent Labs Lab 04/09/16 0724  GLUCAP 148*       Signed:  Lelon Frohlich  [facility] Pager: [phone] 04/09/2016, 3:40 PM

## 2016-04-09 NOTE — Care Management Important Message (Signed)
Important Message  Patient Details  Name: Anita [name] MRN: [id number] Date of Birth: 19-Feb-1951   Medicare Important Message Given:  Yes    Sherald Barge, RN 04/09/2016, 10:13 AM

## 2016-04-09 NOTE — Progress Notes (Signed)
[facility]  Patient Status: Active (receiving services up to time of hospitalization)  [facility] is providing the following services: RN  If patient discharges after hours, please call [phone].   Anita [name] 04/09/2016, 2:08 PM

## 2016-04-09 NOTE — Progress Notes (Signed)
Pt discharged home today per Dr. Hernandez. Pt's IV site D/C'd and WDL. Pt's VSS. Pt provided with home medication list, discharge instructions and prescriptions. Verbalized understanding. Pt left floor via WC in stable condition accompanied by NT. 

## 2016-04-09 NOTE — Progress Notes (Signed)
BP 105/43. On call coverage paged. Ordered to hold 100 mg of hydralazine.  25 mg of metoprolol due. MD ordered to still give med. Oswald Hillock, RN

## 2016-06-05 IMAGING — CR DG CHEST 2V
2 series · 2 of 2 positions shown · non-contrast
Comparison: Chest x-ray 07/17/2013.

CLINICAL DATA: 63-year-old female under preoperative evaluation
prior to total thyroidectomy.

EXAM:
CHEST  2 VIEW

[w chest pa]
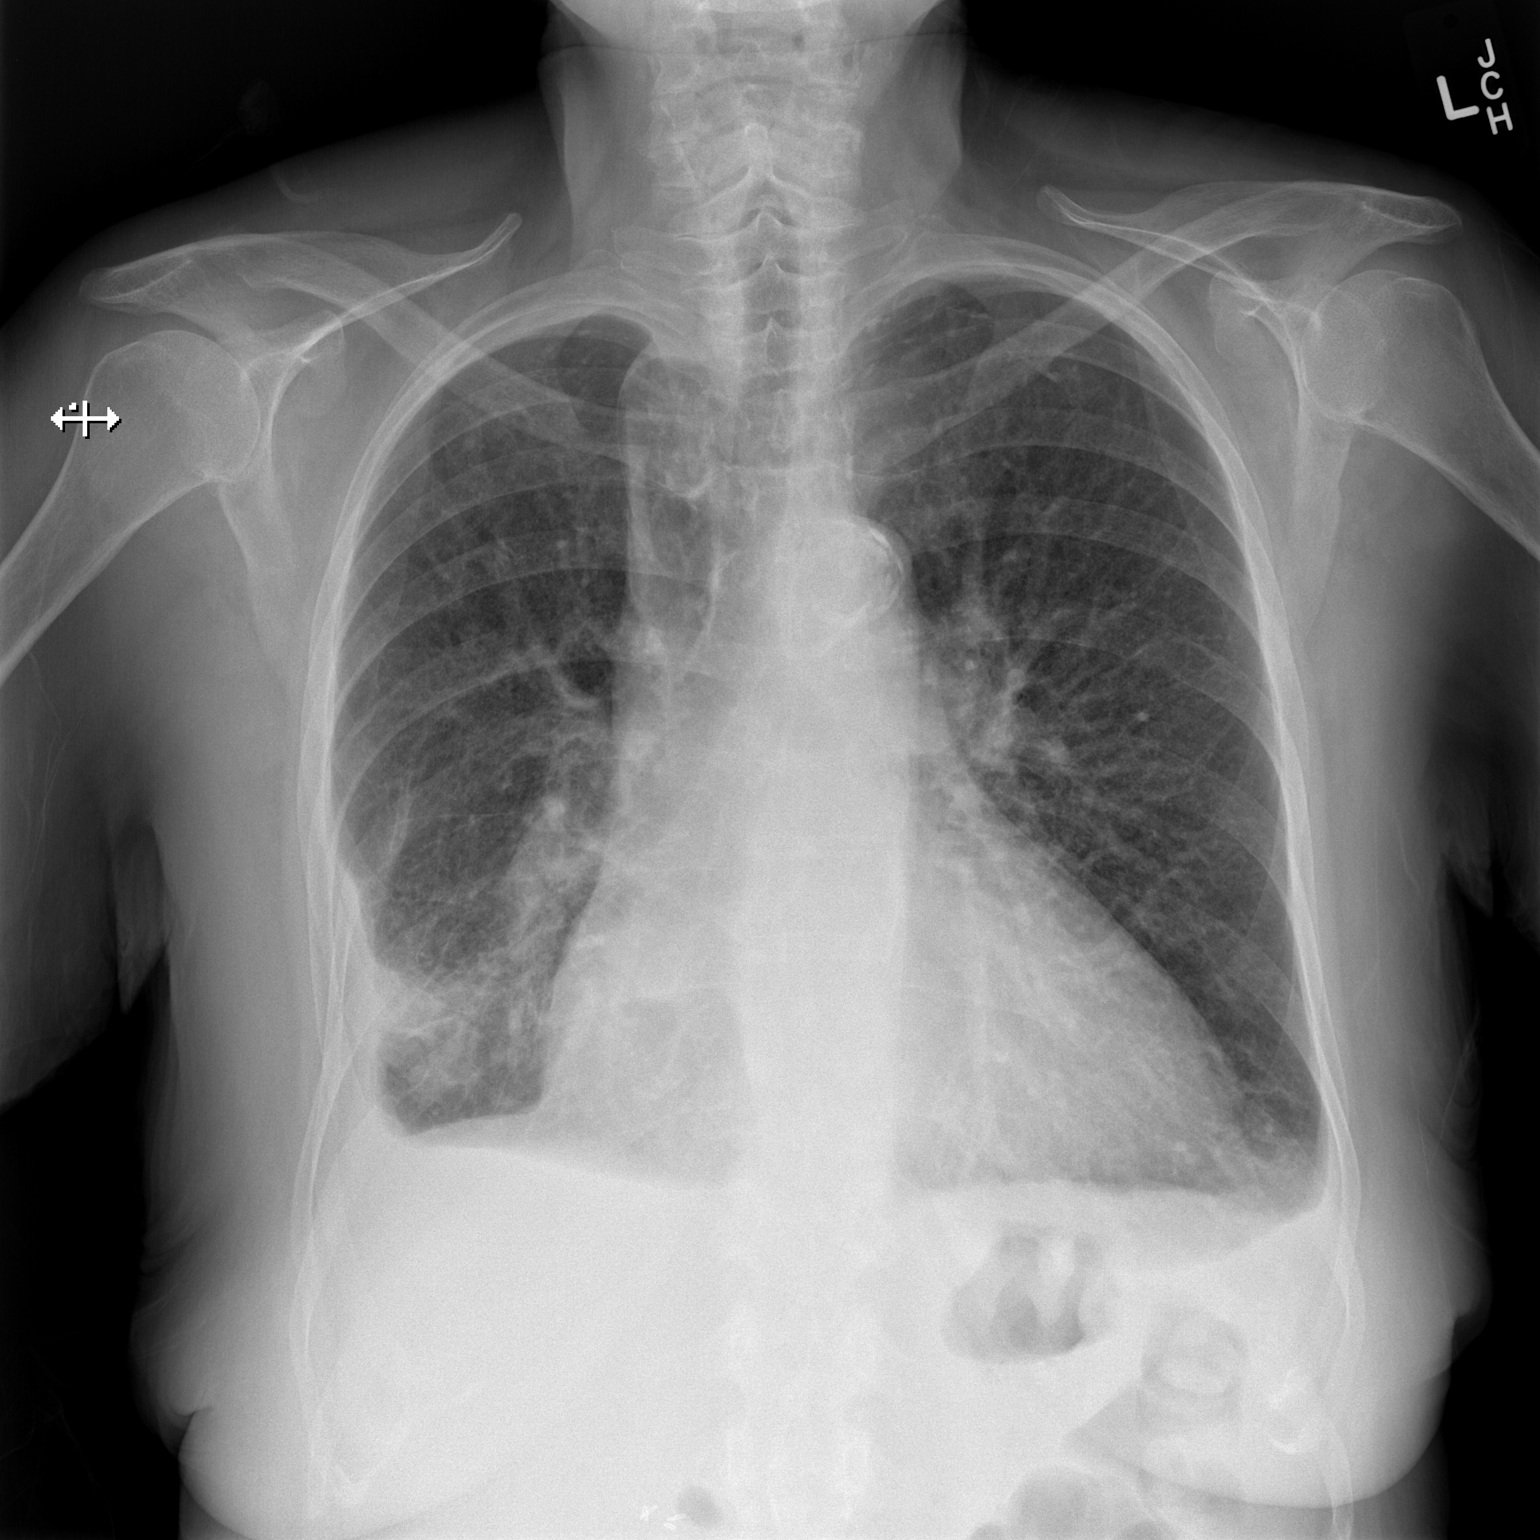

[w chest lat]
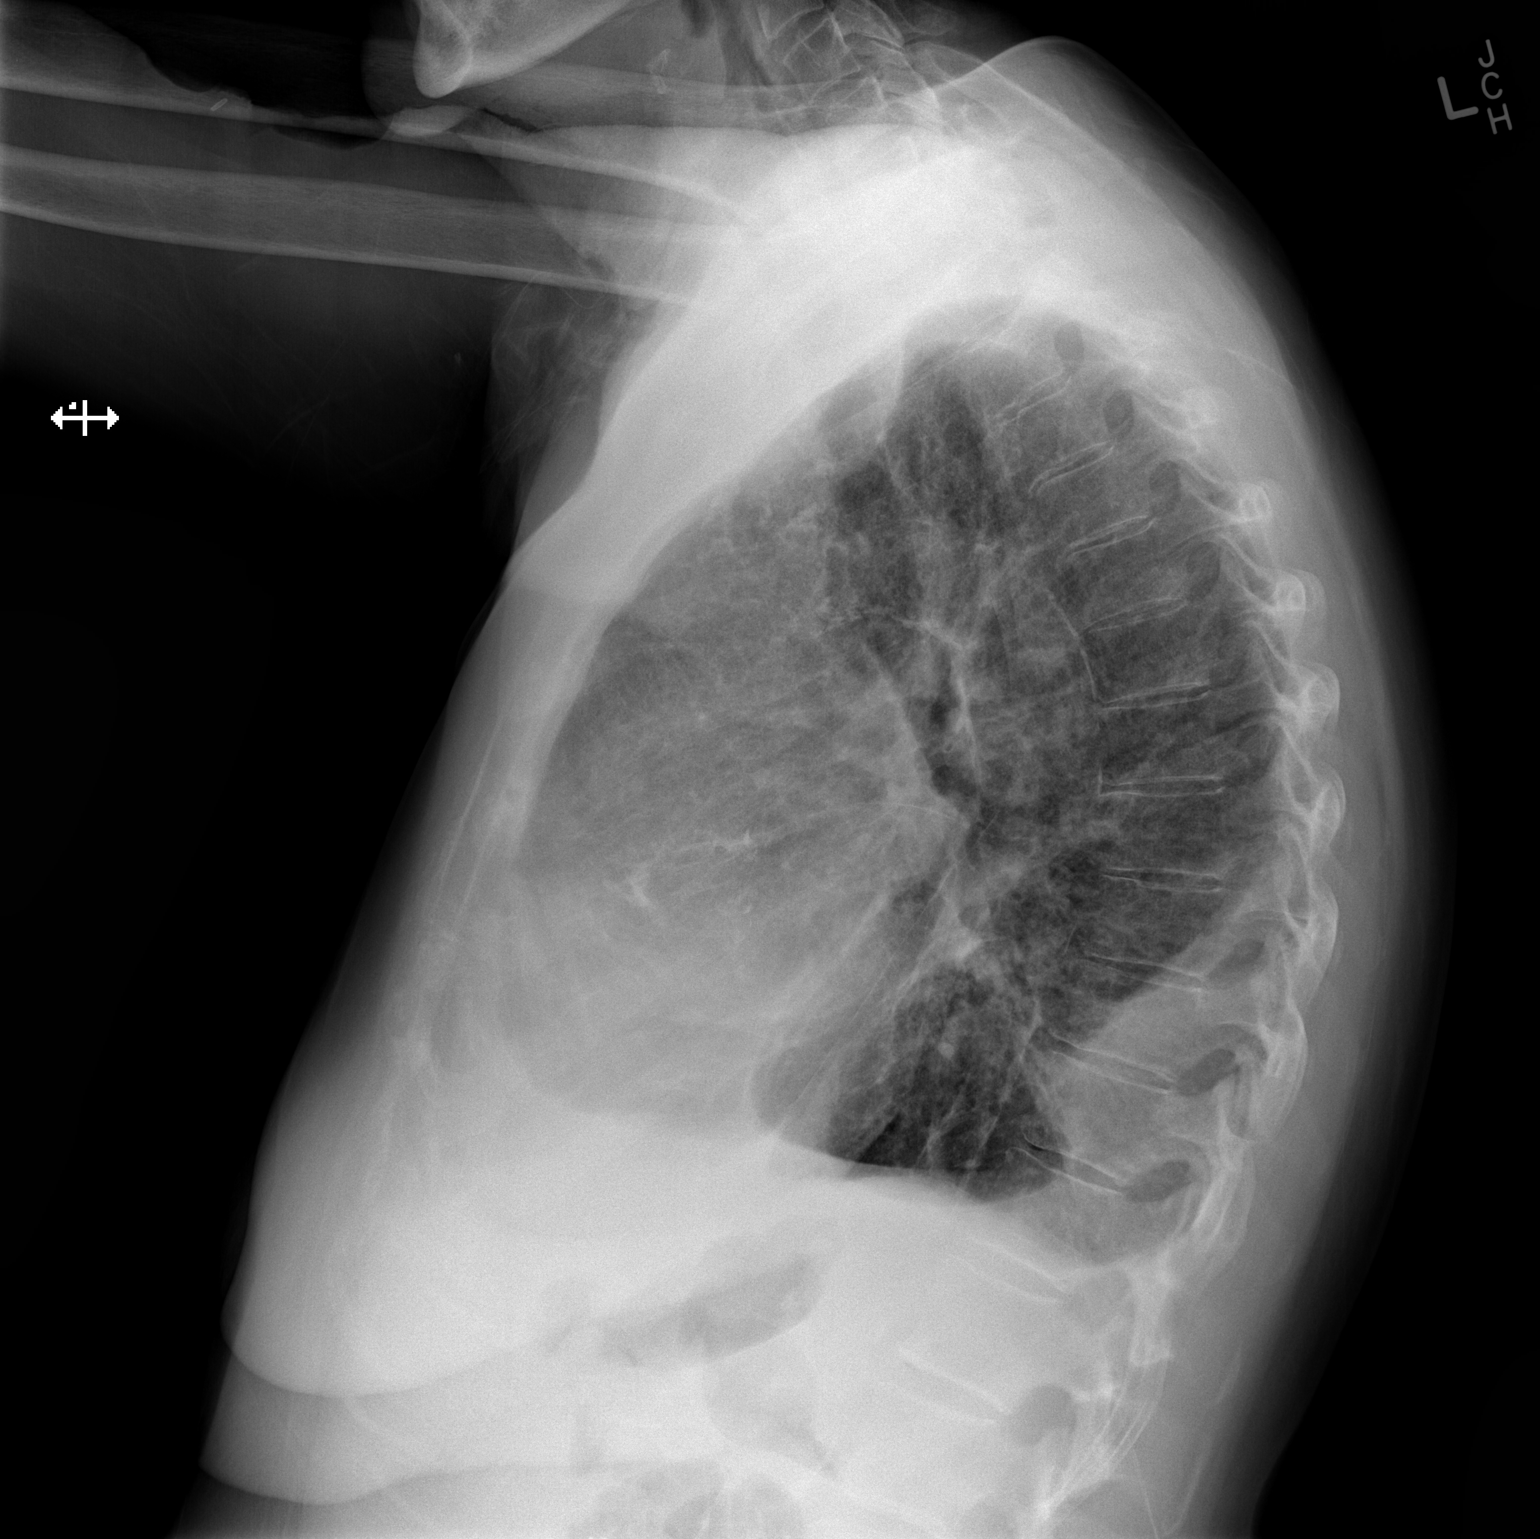

[2 of 2 positions shown; findings below may reference images not displayed]

FINDINGS: Small bilateral chronic pleural effusions appear similar to the
prior examination (right greater than left), with some associated
pleuroparenchymal scarring in the periphery of the right lung base.
Diffuse interstitial prominence and peribronchial cuffing. Mild
cephalization of the pulmonary vasculature. No definite
consolidative airspace disease. Upper mediastinal contours are
within normal limits. Atherosclerosis in the thoracic aorta.
IMPRESSION: 1. Cephalization of the pulmonary vasculature with diffuse
interstitial prominence and peribronchial cuffing. Given the
cardiomegaly, clinical correlation for signs and symptoms of mild
congestive heart failure suggested.
2. Chronic small bilateral (right greater than left) pleural
effusions again noted with some chronic pleuroparenchymal scarring
in the right mid to lower lung.
3. Atherosclerosis.

## 2016-10-21 ENCOUNTER — Encounter (HOSPITAL_COMMUNITY): Payer: Self-pay | Admitting: Cardiovascular Disease

## 2016-10-21 ENCOUNTER — Inpatient Hospital Stay (HOSPITAL_COMMUNITY)
Admission: EM | Admit: 2016-10-21 | Discharge: 2016-10-25 | DRG: 280 | Disposition: A | Discharge status: Home Health | Payer: Medicare Other | Attending: Cardiovascular Disease | Admitting: Cardiovascular Disease

## 2016-10-21 ENCOUNTER — Encounter (HOSPITAL_COMMUNITY)
Admission: EM | Disposition: A | Discharge status: Home Health | Payer: Self-pay | Source: Home / Self Care | Attending: Cardiovascular Disease

## 2016-10-21 DIAGNOSIS — F17210 Nicotine dependence, cigarettes, uncomplicated: Secondary | ICD-10-CM | POA: Diagnosis present

## 2016-10-21 DIAGNOSIS — K219 Gastro-esophageal reflux disease without esophagitis: Secondary | ICD-10-CM | POA: Diagnosis present

## 2016-10-21 DIAGNOSIS — Z66 Do not resuscitate: Secondary | ICD-10-CM | POA: Diagnosis not present

## 2016-10-21 DIAGNOSIS — Z7189 Other specified counseling: Secondary | ICD-10-CM | POA: Diagnosis not present

## 2016-10-21 DIAGNOSIS — I480 Paroxysmal atrial fibrillation: Secondary | ICD-10-CM | POA: Diagnosis present

## 2016-10-21 DIAGNOSIS — I2510 Atherosclerotic heart disease of native coronary artery without angina pectoris: Secondary | ICD-10-CM | POA: Diagnosis present

## 2016-10-21 DIAGNOSIS — J449 Chronic obstructive pulmonary disease, unspecified: Secondary | ICD-10-CM | POA: Diagnosis present

## 2016-10-21 DIAGNOSIS — E1122 Type 2 diabetes mellitus with diabetic chronic kidney disease: Secondary | ICD-10-CM | POA: Diagnosis present

## 2016-10-21 DIAGNOSIS — Z515 Encounter for palliative care: Secondary | ICD-10-CM | POA: Diagnosis not present

## 2016-10-21 DIAGNOSIS — I350 Nonrheumatic aortic (valve) stenosis: Secondary | ICD-10-CM | POA: Diagnosis present

## 2016-10-21 DIAGNOSIS — E785 Hyperlipidemia, unspecified: Secondary | ICD-10-CM | POA: Diagnosis present

## 2016-10-21 DIAGNOSIS — M898X9 Other specified disorders of bone, unspecified site: Secondary | ICD-10-CM | POA: Diagnosis present

## 2016-10-21 DIAGNOSIS — Z881 Allergy status to other antibiotic agents status: Secondary | ICD-10-CM | POA: Diagnosis not present

## 2016-10-21 DIAGNOSIS — Z825 Family history of asthma and other chronic lower respiratory diseases: Secondary | ICD-10-CM

## 2016-10-21 DIAGNOSIS — G894 Chronic pain syndrome: Secondary | ICD-10-CM | POA: Diagnosis present

## 2016-10-21 DIAGNOSIS — Z91041 Radiographic dye allergy status: Secondary | ICD-10-CM

## 2016-10-21 DIAGNOSIS — N2581 Secondary hyperparathyroidism of renal origin: Secondary | ICD-10-CM | POA: Diagnosis present

## 2016-10-21 DIAGNOSIS — Z792 Long term (current) use of antibiotics: Secondary | ICD-10-CM

## 2016-10-21 DIAGNOSIS — Z9981 Dependence on supplemental oxygen: Secondary | ICD-10-CM | POA: Diagnosis not present

## 2016-10-21 DIAGNOSIS — E1121 Type 2 diabetes mellitus with diabetic nephropathy: Secondary | ICD-10-CM | POA: Diagnosis present

## 2016-10-21 DIAGNOSIS — I5032 Chronic diastolic (congestive) heart failure: Secondary | ICD-10-CM | POA: Diagnosis present

## 2016-10-21 DIAGNOSIS — R001 Bradycardia, unspecified: Secondary | ICD-10-CM | POA: Diagnosis not present

## 2016-10-21 DIAGNOSIS — I219 Acute myocardial infarction, unspecified: Secondary | ICD-10-CM | POA: Diagnosis present

## 2016-10-21 DIAGNOSIS — I132 Hypertensive heart and chronic kidney disease with heart failure and with stage 5 chronic kidney disease, or end stage renal disease: Secondary | ICD-10-CM | POA: Diagnosis present

## 2016-10-21 DIAGNOSIS — E890 Postprocedural hypothyroidism: Secondary | ICD-10-CM | POA: Diagnosis present

## 2016-10-21 DIAGNOSIS — E1140 Type 2 diabetes mellitus with diabetic neuropathy, unspecified: Secondary | ICD-10-CM | POA: Diagnosis present

## 2016-10-21 DIAGNOSIS — R64 Cachexia: Secondary | ICD-10-CM

## 2016-10-21 DIAGNOSIS — D631 Anemia in chronic kidney disease: Secondary | ICD-10-CM | POA: Diagnosis present

## 2016-10-21 DIAGNOSIS — E43 Unspecified severe protein-calorie malnutrition: Secondary | ICD-10-CM | POA: Diagnosis present

## 2016-10-21 DIAGNOSIS — Z992 Dependence on renal dialysis: Secondary | ICD-10-CM

## 2016-10-21 DIAGNOSIS — N186 End stage renal disease: Secondary | ICD-10-CM | POA: Diagnosis present

## 2016-10-21 DIAGNOSIS — Z8349 Family history of other endocrine, nutritional and metabolic diseases: Secondary | ICD-10-CM

## 2016-10-21 DIAGNOSIS — M542 Cervicalgia: Secondary | ICD-10-CM | POA: Diagnosis not present

## 2016-10-21 DIAGNOSIS — R570 Cardiogenic shock: Secondary | ICD-10-CM | POA: Diagnosis present

## 2016-10-21 DIAGNOSIS — I2119 ST elevation (STEMI) myocardial infarction involving other coronary artery of inferior wall: Secondary | ICD-10-CM | POA: Diagnosis not present

## 2016-10-21 DIAGNOSIS — Z7901 Long term (current) use of anticoagulants: Secondary | ICD-10-CM

## 2016-10-21 DIAGNOSIS — E11319 Type 2 diabetes mellitus with unspecified diabetic retinopathy without macular edema: Secondary | ICD-10-CM | POA: Diagnosis present

## 2016-10-21 DIAGNOSIS — Z6822 Body mass index (BMI) 22.0-22.9, adult: Secondary | ICD-10-CM

## 2016-10-21 DIAGNOSIS — Z8249 Family history of ischemic heart disease and other diseases of the circulatory system: Secondary | ICD-10-CM

## 2016-10-21 DIAGNOSIS — Z888 Allergy status to other drugs, medicaments and biological substances status: Secondary | ICD-10-CM

## 2016-10-21 DIAGNOSIS — Z833 Family history of diabetes mellitus: Secondary | ICD-10-CM

## 2016-10-21 HISTORY — DX: ST elevation (STEMI) myocardial infarction involving other coronary artery of inferior wall: I21.19

## 2016-10-21 HISTORY — PX: LEFT HEART CATH AND CORONARY ANGIOGRAPHY: CATH118249

## 2016-10-21 LAB — POCT I-STAT TROPONIN I: Troponin i, poc: 0.08 ng/mL (ref 0.00–0.08)

## 2016-10-21 LAB — I-STAT CHEM 8, ED
BUN: 44 mg/dL — ABNORMAL HIGH (ref 6–20)
Calcium, Ion: 0.92 mmol/L — ABNORMAL LOW (ref 1.15–1.40)
Chloride: 100 mmol/L — ABNORMAL LOW (ref 101–111)
Creatinine, Ser: 3.6 mg/dL — ABNORMAL HIGH (ref 0.44–1.00)
Glucose, Bld: 116 mg/dL — ABNORMAL HIGH (ref 65–99)
HEMATOCRIT: 32 % — AB (ref 36.0–46.0)
HEMOGLOBIN: 10.9 g/dL — AB (ref 12.0–15.0)
Potassium: 4 mmol/L (ref 3.5–5.1)
SODIUM: 137 mmol/L (ref 135–145)
TCO2: 30 mmol/L (ref 0–100)

## 2016-10-21 LAB — CBC WITH DIFFERENTIAL/PLATELET
BASOS ABS: 0 10*3/uL (ref 0.0–0.1)
BASOS PCT: 0 %
Eosinophils Absolute: 0 10*3/uL (ref 0.0–0.7)
Eosinophils Relative: 1 %
HEMATOCRIT: 32 % — AB (ref 36.0–46.0)
HEMOGLOBIN: 9.5 g/dL — AB (ref 12.0–15.0)
Lymphocytes Relative: 18 %
Lymphs Abs: 0.7 10*3/uL (ref 0.7–4.0)
MCH: 27 pg (ref 26.0–34.0)
MCHC: 29.7 g/dL — ABNORMAL LOW (ref 30.0–36.0)
MCV: 90.9 fL (ref 78.0–100.0)
Monocytes Absolute: 0.2 10*3/uL (ref 0.1–1.0)
Monocytes Relative: 6 %
NEUTROS ABS: 2.9 10*3/uL (ref 1.7–7.7)
NEUTROS PCT: 76 %
Platelets: 100 10*3/uL — ABNORMAL LOW (ref 150–400)
RBC: 3.52 MIL/uL — ABNORMAL LOW (ref 3.87–5.11)
RDW: 14.3 % (ref 11.5–15.5)
WBC: 3.9 10*3/uL — AB (ref 4.0–10.5)

## 2016-10-21 LAB — APTT: aPTT: 34 seconds (ref 24–36)

## 2016-10-21 LAB — PROTIME-INR
INR: 1.17
PROTHROMBIN TIME: 15 s (ref 11.4–15.2)

## 2016-10-21 LAB — TROPONIN I: TROPONIN I: 1.09 ng/mL — AB (ref <0.03)

## 2016-10-21 LAB — MRSA PCR SCREENING: MRSA BY PCR: NEGATIVE

## 2016-10-21 SURGERY — LEFT HEART CATH AND CORONARY ANGIOGRAPHY
Anesthesia: LOCAL

## 2016-10-21 MED ORDER — RENA-VITE PO TABS
1.0000 | ORAL_TABLET | Freq: Every day | ORAL | Status: DC
  Administered 2016-10-21 – 2016-10-24 (×4): 1 via ORAL
  Filled 2016-10-21 (×4): qty 1

## 2016-10-21 MED ORDER — AMIODARONE HCL IN DEXTROSE 360-4.14 MG/200ML-% IV SOLN
30.0000 mg/h | INTRAVENOUS | Status: DC
  Administered 2016-10-22: 30 mg/h via INTRAVENOUS
  Filled 2016-10-21: qty 200

## 2016-10-21 MED ORDER — LEVOTHYROXINE SODIUM 75 MCG PO TABS: 175.0000 ug | ORAL_TABLET | Freq: Every day | ORAL | Status: DC

## 2016-10-21 MED ORDER — FENTANYL CITRATE (PF) 100 MCG/2ML IJ SOLN
25.0000 ug | Freq: Once | INTRAMUSCULAR | Status: AC
Start: 2016-10-21 — End: 2016-10-21
  Administered 2016-10-21: 25 ug via INTRAVENOUS
  Filled 2016-10-21: qty 2

## 2016-10-21 MED ORDER — AMIODARONE HCL 150 MG/3ML IV SOLN
INTRAVENOUS | Status: AC
  Filled 2016-10-21: qty 3

## 2016-10-21 MED ORDER — SEVELAMER CARBONATE 800 MG PO TABS: 2400.0000 mg | ORAL_TABLET | Freq: Two times a day (BID) | ORAL | Status: DC | PRN

## 2016-10-21 MED ORDER — ACETAMINOPHEN 325 MG PO TABS
650.0000 mg | ORAL_TABLET | ORAL | Status: DC | PRN
  Filled 2016-10-21: qty 2

## 2016-10-21 MED ORDER — IOPAMIDOL (ISOVUE-370) INJECTION 76%
INTRAVENOUS | Status: AC
  Filled 2016-10-21: qty 125

## 2016-10-21 MED ORDER — MECLIZINE HCL 25 MG PO TABS: 25.0000 mg | ORAL_TABLET | Freq: Four times a day (QID) | ORAL | Status: DC | PRN

## 2016-10-21 MED ORDER — FENTANYL CITRATE (PF) 100 MCG/2ML IJ SOLN
INTRAMUSCULAR | Status: DC | PRN
  Administered 2016-10-21: 25 ug via INTRAVENOUS

## 2016-10-21 MED ORDER — SODIUM CHLORIDE 0.9 % IV BOLUS (SEPSIS)
1000.0000 mL | Freq: Once | INTRAVENOUS | Status: AC
  Administered 2016-10-21: 1000 mL via INTRAVENOUS

## 2016-10-21 MED ORDER — LIDOCAINE HCL (PF) 1 % IJ SOLN
INTRAMUSCULAR | Status: AC
  Filled 2016-10-21: qty 30

## 2016-10-21 MED ORDER — HYDROCODONE-ACETAMINOPHEN 7.5-325 MG PO TABS
1.0000 | ORAL_TABLET | Freq: Four times a day (QID) | ORAL | Status: DC | PRN
  Administered 2016-10-21 – 2016-10-23 (×3): 2 via ORAL
  Filled 2016-10-21 (×3): qty 2

## 2016-10-21 MED ORDER — FENTANYL CITRATE (PF) 100 MCG/2ML IJ SOLN
50.0000 ug | Freq: Once | INTRAMUSCULAR | Status: AC
  Administered 2016-10-21: 50 ug via INTRAVENOUS

## 2016-10-21 MED ORDER — SODIUM CHLORIDE 0.9% FLUSH
3.0000 mL | INTRAVENOUS | Status: DC | PRN
  Administered 2016-10-25: 10 mL via INTRAVENOUS
  Filled 2016-10-21: qty 3

## 2016-10-21 MED ORDER — HEPARIN (PORCINE) IN NACL 100-0.45 UNIT/ML-% IJ SOLN
1050.0000 [IU]/h | INTRAMUSCULAR | Status: AC
  Administered 2016-10-22: 900 [IU]/h via INTRAVENOUS
  Filled 2016-10-21: qty 250

## 2016-10-21 MED ORDER — AMIODARONE HCL IN DEXTROSE 360-4.14 MG/200ML-% IV SOLN: 60.0000 mg/h | INTRAVENOUS | Status: DC

## 2016-10-21 MED ORDER — ASPIRIN 81 MG PO CHEW
324.0000 mg | CHEWABLE_TABLET | Freq: Once | ORAL | Status: AC
  Administered 2016-10-21: 324 mg via ORAL

## 2016-10-21 MED ORDER — ASPIRIN 81 MG PO CHEW
81.0000 mg | CHEWABLE_TABLET | Freq: Every day | ORAL | Status: DC
  Administered 2016-10-22 – 2016-10-25 (×4): 81 mg via ORAL
  Filled 2016-10-21 (×4): qty 1

## 2016-10-21 MED ORDER — FENTANYL CITRATE (PF) 100 MCG/2ML IJ SOLN
INTRAMUSCULAR | Status: AC
  Filled 2016-10-21: qty 2

## 2016-10-21 MED ORDER — LIOTHYRONINE SODIUM 25 MCG PO TABS: 25.0000 ug | ORAL_TABLET | Freq: Every day | ORAL | Status: DC

## 2016-10-21 MED ORDER — ORAL CARE MOUTH RINSE
15.0000 mL | Freq: Two times a day (BID) | OROMUCOSAL | Status: DC
  Administered 2016-10-24: 15 mL via OROMUCOSAL

## 2016-10-21 MED ORDER — POLYETHYLENE GLYCOL 3350 17 G PO PACK
17.0000 g | PACK | Freq: Every day | ORAL | Status: DC
  Administered 2016-10-23 – 2016-10-24 (×2): 17 g via ORAL
  Filled 2016-10-21 (×5): qty 1

## 2016-10-21 MED ORDER — METOPROLOL TARTRATE 25 MG PO TABS
25.0000 mg | ORAL_TABLET | Freq: Two times a day (BID) | ORAL | Status: DC
  Administered 2016-10-21 – 2016-10-22 (×3): 25 mg via ORAL
  Filled 2016-10-21 (×3): qty 1

## 2016-10-21 MED ORDER — MIDAZOLAM HCL 2 MG/2ML IJ SOLN
INTRAMUSCULAR | Status: DC | PRN
  Administered 2016-10-21 (×2): 1 mg via INTRAVENOUS

## 2016-10-21 MED ORDER — MORPHINE SULFATE (PF) 2 MG/ML IV SOLN: 2.0000 mg | INTRAVENOUS | Status: DC | PRN

## 2016-10-21 MED ORDER — AMIODARONE HCL IN DEXTROSE 360-4.14 MG/200ML-% IV SOLN
INTRAVENOUS | Status: AC
  Filled 2016-10-21: qty 200

## 2016-10-21 MED ORDER — AMIODARONE HCL IN DEXTROSE 360-4.14 MG/200ML-% IV SOLN
60.0000 mg/h | INTRAVENOUS | Status: AC
  Administered 2016-10-21: 60 mg/h via INTRAVENOUS
  Filled 2016-10-21: qty 200

## 2016-10-21 MED ORDER — BISACODYL 10 MG RE SUPP: 10.0000 mg | Freq: Every day | RECTAL | Status: DC | PRN

## 2016-10-21 MED ORDER — SODIUM CHLORIDE 0.9 % IV BOLUS (SEPSIS): 1000.0000 mL | Freq: Once | INTRAVENOUS | Status: DC

## 2016-10-21 MED ORDER — PANTOPRAZOLE SODIUM 40 MG PO TBEC
40.0000 mg | DELAYED_RELEASE_TABLET | Freq: Every day | ORAL | Status: DC
  Administered 2016-10-23 – 2016-10-25 (×3): 40 mg via ORAL
  Filled 2016-10-21 (×4): qty 1

## 2016-10-21 MED ORDER — MEGESTROL ACETATE 400 MG/10ML PO SUSP
400.0000 mg | Freq: Every day | ORAL | Status: DC
  Filled 2016-10-21 (×2): qty 10

## 2016-10-21 MED ORDER — MIDAZOLAM HCL 2 MG/2ML IJ SOLN
INTRAMUSCULAR | Status: AC
  Filled 2016-10-21: qty 2

## 2016-10-21 MED ORDER — HEPARIN SODIUM (PORCINE) 1000 UNIT/ML IJ SOLN
INTRAMUSCULAR | Status: AC
  Filled 2016-10-21: qty 1

## 2016-10-21 MED ORDER — ZOLPIDEM TARTRATE 5 MG PO TABS
5.0000 mg | ORAL_TABLET | Freq: Every evening | ORAL | Status: DC | PRN
  Administered 2016-10-21 – 2016-10-24 (×4): 5 mg via ORAL
  Filled 2016-10-21 (×4): qty 1

## 2016-10-21 MED ORDER — ALPRAZOLAM 0.5 MG PO TABS
1.0000 mg | ORAL_TABLET | Freq: Every evening | ORAL | Status: DC | PRN
  Administered 2016-10-22 (×2): 1 mg via ORAL
  Filled 2016-10-21 (×2): qty 2

## 2016-10-21 MED ORDER — SODIUM CHLORIDE 0.9 % IV SOLN
INTRAVENOUS | Status: DC | PRN
  Administered 2016-10-21: 50 mL/h via INTRAVENOUS

## 2016-10-21 MED ORDER — SODIUM CHLORIDE 0.9% FLUSH
3.0000 mL | Freq: Two times a day (BID) | INTRAVENOUS | Status: DC
  Administered 2016-10-21 – 2016-10-24 (×7): 3 mL via INTRAVENOUS

## 2016-10-21 MED ORDER — SEVELAMER CARBONATE 800 MG PO TABS
3200.0000 mg | ORAL_TABLET | Freq: Three times a day (TID) | ORAL | Status: DC
  Administered 2016-10-22 (×2): 3200 mg via ORAL
  Administered 2016-10-23: 2400 mg via ORAL
  Administered 2016-10-23: 3200 mg via ORAL
  Administered 2016-10-23: 2400 mg via ORAL
  Administered 2016-10-24 – 2016-10-25 (×2): 3200 mg via ORAL
  Filled 2016-10-21 (×9): qty 4

## 2016-10-21 MED ORDER — ONDANSETRON HCL 4 MG/2ML IJ SOLN
4.0000 mg | Freq: Four times a day (QID) | INTRAMUSCULAR | Status: DC | PRN
  Administered 2016-10-22 – 2016-10-25 (×2): 4 mg via INTRAVENOUS
  Filled 2016-10-21 (×2): qty 2

## 2016-10-21 MED ORDER — ALPRAZOLAM 0.5 MG PO TABS
0.5000 mg | ORAL_TABLET | Freq: Three times a day (TID) | ORAL | Status: DC | PRN
  Administered 2016-10-23 – 2016-10-25 (×3): 0.5 mg via ORAL
  Filled 2016-10-21 (×3): qty 1

## 2016-10-21 MED ORDER — AMIODARONE HCL IN DEXTROSE 360-4.14 MG/200ML-% IV SOLN
INTRAVENOUS | Status: DC | PRN
  Administered 2016-10-21: 60 mg/h via INTRAVENOUS

## 2016-10-21 MED ORDER — FENTANYL CITRATE (PF) 100 MCG/2ML IJ SOLN: 50.0000 ug | Freq: Once | INTRAMUSCULAR | Status: DC

## 2016-10-21 MED ORDER — SODIUM CHLORIDE 0.9 % IV SOLN: 250.0000 mL | INTRAVENOUS | Status: DC | PRN

## 2016-10-21 MED ORDER — NITROGLYCERIN 0.4 MG SL SUBL: 0.4000 mg | SUBLINGUAL_TABLET | SUBLINGUAL | Status: DC | PRN

## 2016-10-21 MED ORDER — IOPAMIDOL (ISOVUE-370) INJECTION 76%
INTRAVENOUS | Status: DC | PRN
  Administered 2016-10-21: 85 mL via INTRA_ARTERIAL

## 2016-10-21 MED ORDER — ASPIRIN 81 MG PO CHEW
CHEWABLE_TABLET | ORAL | Status: AC
  Filled 2016-10-21: qty 4

## 2016-10-21 MED ORDER — LIDOCAINE HCL (PF) 1 % IJ SOLN
INTRAMUSCULAR | Status: DC | PRN
  Administered 2016-10-21: 15 mL

## 2016-10-21 MED ORDER — HEPARIN (PORCINE) IN NACL 2-0.9 UNIT/ML-% IJ SOLN
INTRAMUSCULAR | Status: AC
  Filled 2016-10-21: qty 500

## 2016-10-21 MED ORDER — HEPARIN (PORCINE) IN NACL 2-0.9 UNIT/ML-% IJ SOLN
INTRAMUSCULAR | Status: DC | PRN
  Administered 2016-10-21: 1000 mL

## 2016-10-21 SURGICAL SUPPLY — 13 items
CATH INFINITI 5FR MULTPACK ANG (CATHETERS) ×1 IMPLANT
COVER PRB 48X5XTLSCP FOLD TPE (BAG) IMPLANT
COVER PROBE 5X48 (BAG) ×2
DEVICE CLOSURE PERCLS PRGLD 6F (VASCULAR PRODUCTS) IMPLANT
KIT ENCORE 26 ADVANTAGE (KITS) ×1 IMPLANT
KIT HEART LEFT (KITS) ×2 IMPLANT
PACK CARDIAC CATHETERIZATION (CUSTOM PROCEDURE TRAY) ×2 IMPLANT
PERCLOSE PROGLIDE 6F (VASCULAR PRODUCTS) ×2
SHEATH PINNACLE 6F 10CM (SHEATH) ×1 IMPLANT
TRANSDUCER W/STOPCOCK (MISCELLANEOUS) ×2 IMPLANT
TRAY CATH 3LUMEN 20C SULFAFREE (CATHETERS) ×1 IMPLANT
TUBING CIL FLEX 10 FLL-RA (TUBING) ×2 IMPLANT
WIRE EMERALD 3MM-J .035X150CM (WIRE) ×1 IMPLANT

## 2016-10-21 NOTE — ED Notes (Signed)
Pt transported to cath lab, remains monitored with RN.

## 2016-10-21 NOTE — Progress Notes (Signed)
ANTICOAGULATION CONSULT NOTE - Initial Consult  Pharmacy Consult for heparin Indication: ACS/STEMI  Allergies  Allergen Reactions  . Doxycycline Nausea And Vomiting  . Iodine   . Lipitor [Atorvastatin] Nausea And Vomiting    Patient Measurements:  57.6 kg 03/2016 169 cm 03/2016  Vital Signs: Temp: 97.6 F (36.4 C) (03/06 1507) Temp Source: Oral (03/06 1507) BP: 145/87 (03/06 1645) Pulse Rate: 101 (03/06 1645)  Labs:  Recent Labs  10/21/16 1502 10/21/16 1520 10/21/16 1526  HGB  --  9.5* 10.9*  HCT  --  32.0* 32.0*  PLT  --  100*  --   APTT 34  --   --   LABPROT 15.0  --   --   INR 1.17  --   --   CREATININE  --   --  3.60*    CrCl cannot be calculated (Unknown ideal weight.).   Medical History: Past Medical History:  Diagnosis Date  . Acute ST elevation myocardial infarction (STEMI) of inferior wall ([place]) 10/21/2016  . Anemia   . Aortic stenosis 12/23/2012  . Atherosclerosis of aorta ([place])   . Atrial fibrillation ([place])    Diagnosed 11/14 of undetermined age of onset    . CAD (coronary artery disease) 07/13/2013   Catheterization 5 years ago by Dr. Elisabeth Cara with reportedly nonobstructive disease Calcification noted on CT scan of chest in November of 2014   . CHF (congestive heart failure) ([place])   . Chronic diastolic heart failure ([place])   . COPD (chronic obstructive pulmonary disease) ([place])   . Diabetes mellitus with end stage renal disease ([place])   . Diabetic nephropathy ([place])   . Diabetic neuropathy ([place])   . Dyslipidemia   . ESRD on hemodialysis ([place])   . GERD (gastroesophageal reflux disease)   . Hypertension   . Laryngeal mass   . Neuropathy due to secondary diabetes ([place])   . PONV (postoperative nausea and vomiting)   . 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 Shortness of breath dyspnea    with exertion  . Sleep apnea    Does not use CPAP- due to weight loss  . Thyroid neoplasm    of uncertain behavior  . Wears glasses     Medications:  Med hx  not complete yet  Assessment: 66 y/o female with ESRD on HD today when she developed chest pain. She was brought in as a STEMI and taken to the cath lab. Pharmacy consulted to begin a heparin drip 8 hrs after sheath removal (~16:10). Of note, she takes apixaban 2.5 mg PO bid PTA for Afib, last dose per husband was 3/6 09:00.  She is s/p cath - probable embolic event involving RCA, heavily calcified nonobstructive CAD involving left main, LAD, LCx, porcelain aorta. No percutaneous options for CAD per cardiology.  No bleeding noted, Hgb low at 10.9, platelets low at 100. Baseline aPTT is 34, unable to add heparin level to previous collection. Expect heparin level to be affected by apixaban so will follow aPTT until it correlates with heparin level. Agree with cardiology that warfarin would be a better option for this patient.  Goal of Therapy:  Heparin level 0.3-0.7 units/ml aPTT 66-102 seconds Monitor platelets by anticoagulation protocol: Yes   Plan:  - Heparin drip at 900 units/hr with no bolus to begin 3/7 at 00:15 - 8 hr aPTT and heparin level - Daily heparin level, aPTT, CBC - Monitor for s/sx of bleeding   Renold Genta, PharmD, BCPS Clinical Pharmacist  Phone for today - [place] - [phone] 10/21/2016 4:57 PM

## 2016-10-21 NOTE — Progress Notes (Signed)
CRITICAL VALUE ALERT  Critical value received:  Troponin 1.09  Date of notification:  10/21/2016  Time of notification:  2052  Critical value read back:Yes.    Nurse who received alert:  Hadassah Pais RN  Pt had heart cath today  for STEMI MD aware

## 2016-10-21 NOTE — Progress Notes (Signed)
Have discussed goals of care with patient, husband, and daughter. All agree DNR is consistent with her wishes. The patient has very poor quality of life on hemodialysis with severe O2 dependent COPD, severe aortic stenosis, chronic pain, and now with an acute MI.  Sherren Mocha 10/21/2016 6:29 PM

## 2016-10-21 NOTE — ED Provider Notes (Signed)
[facility] DEPT Provider Note   CSN: [id number] Arrival date & time: 10/21/16  1503     History   Chief Complaint Chief Complaint  Patient presents with  . Chest Pain    HPI Anita [name] is a 66 y.o. female.  HPI Arrives as a STEMI from dialysis.  Patient had not been feeling well the day before and skipped dialysis, she had had some nausea and diarrhea and skipped dialysis but went into dialysis today and had about half of her fluid removed when she started complaining of chest pain EMS was called and when they arrived they noted an inferior STEMI Patient was hypotensive and distressed Anita [name] history of coronary artery disease, aortic stenosis, atrial fibrillation and is on Eliquis Due to her current anticoagulation, she was not given aspirin She was started on fluids and transported here while remaining hypotensive She was not given any nitroglycerin for that reason Patient states she has 10/10 pain in the substernal and left chest that radiates down her left arm  Past Medical History:  Diagnosis Date  . Anemia   . Aortic stenosis 12/23/2012  . Atherosclerosis of aorta ([place])   . Atrial fibrillation ([place])    Diagnosed 11/14 of undetermined age of onset    . CAD (coronary artery disease) 07/13/2013   Catheterization 5 years ago by Dr. Elisabeth Cara with reportedly nonobstructive disease Calcification noted on CT scan of chest in November of 2014   . CHF (congestive heart failure) ([place])   . Chronic diastolic heart failure ([place])   . COPD (chronic obstructive pulmonary disease) ([place])   . 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 Diabetic nephropathy ([place])   . Diabetic neuropathy ([place])   . Dyslipidemia   . ESRD on hemodialysis ([place])   . GERD (gastroesophageal reflux disease)   . Hypertension   . Laryngeal mass   . Neuropathy due to secondary diabetes ([place])   . PONV (postoperative nausea and vomiting)   . 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 Shortness  of breath dyspnea    with exertion  . Sleep apnea    Does not use CPAP- due to weight loss  . Thyroid neoplasm    of uncertain behavior  . Wears glasses     Patient Active Problem List   Diagnosis Date Noted  . Chronic respiratory failure with hypoxia ([place]) 04/08/2016  . Hypothyroidism 04/08/2016  . SBO (small bowel obstruction) 02/15/2016  . Small bowel obstruction 02/15/2016  . Chest pain 12/30/2015  . Chest pain with moderate risk of acute coronary syndrome 10/23/2015  . Abnormal EKG 10/23/2015  . Pain in the chest   . ESRD (end stage renal disease) ([place])   . Pulmonary infiltrate   . HCAP- Jan 2017 08/31/2015  . Acute respiratory failure with hypoxia ([place]) 08/31/2015  . Elevated troponin 08/31/2015  . Essential hypertension 08/31/2015  . PNA (pneumonia) 08/28/2015  . 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 PAF (paroxysmal atrial fibrillation) ([place]) 02/27/2014  . Right foot injury 09/07/2013  . CAD- non obstructive CAD 2009 07/13/2013  . Protein-calorie malnutrition, severe ([place]) 07/13/2013  . 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 Atherosclerosis of aorta ([place])   . ESRD on hemodialysis ([place])   . Chronic diastolic heart failure ([place])   . Diabetic nephropathy ([place])   . Neuropathy due to secondary diabetes ([place])   .  Long-term (current) use of anticoagulants   . Edema 03/21/2013  . Aortic stenosis 12/23/2012  . Obesity, unspecified 10/22/2011  . Secondary hyperparathyroidism, non-renal ([place]) 07/05/2009    Past Surgical History:  Procedure Laterality Date  . ACHILLES TENDON REPAIR Right   . APPENDECTOMY    . AV FISTULA PLACEMENT Left 12/29/2012   Procedure: ARTERIOVENOUS (AV) FISTULA CREATION;  Surgeon: Elam Dutch, MD;  Location: [facility] OR;  Service: Vascular;  Laterality: Left;  Creation Left Brachial Cephalic Arteriovenous Fistula  . CARDIAC CATHETERIZATION    . CESAREAN SECTION      X 2   . CHOLECYSTECTOMY    . Elbow tendon surgery Right   . HEMORRHOID SURGERY     many years ago  . LARYNGECTOMY     mass removed- noncancerous  . LUMBAR LAMINECTOMY     lower back  . SHOULDER ARTHROSCOPY Right    w/ repair of rotator cuff repair  . THYROIDECTOMY N/A 10/19/2014   Procedure: TOTAL THYROIDECTOMY;  Surgeon: Armandina Gemma, MD;  Location: [facility];  Service: General;  Laterality: N/A;    OB History    No data available       Home Medications    Prior to Admission medications   Medication Sig Start Date End Date Taking? Authorizing Provider  ALPRAZolam Duanne Moron) 1 MG tablet Take 0.5-1 mg by mouth 4 (four) times daily. Patient takes 1/2 tablet 2-3 times daily and 1 tablet at bedtime    Historical Provider, MD  apixaban (ELIQUIS) 2.5 MG TABS tablet Take 1 tablet (2.5 mg total) by mouth 2 (two) times daily. 04/09/16   Erline Hau, MD  bisacodyl (DULCOLAX) 10 MG suppository Place 1 suppository (10 mg total) rectally daily as needed for moderate constipation. 02/16/16   Kathie Dike, MD  Cyanocobalamin (VITAMIN B 12 PO) Take 1,000 mg by mouth daily.    Historical Provider, MD  diltiazem (CARDIZEM CD) 240 MG 24 hr capsule TAKE (1) CAPSULE BY MOUTH AT BEDTIME. 04/03/16   Arnoldo Lenis, MD  hydrALAZINE (APRESOLINE) 100 MG tablet Take 100 mg by mouth 3 (three) times daily. 11/29/15   Historical Provider, MD  HYDROcodone-acetaminophen (NORCO) 7.5-325 MG per tablet Take 1 tablet by mouth every 6 (six) hours as needed for pain. Patient taking differently: Take 1-2 tablets by mouth every 6 (six) hours as needed for moderate pain or severe pain.  02/15/13   Regina J Roczniak, PA-C  ipratropium-albuterol (DUONEB) 0.5-2.5 (3) MG/3ML SOLN Take 3 mLs by nebulization every 4 (four) hours as needed. Patient taking differently: Take 3 mLs by nebulization 2 (two) times daily. *May take up to 4 times daily* 09/03/15   Kathie Dike, MD  levofloxacin (LEVAQUIN) 500 MG tablet Take 1 tablet  (500 mg total) by mouth every other day. 04/09/16   Erline Hau, MD  meclizine (ANTIVERT) 25 MG tablet Take 25 mg by mouth 4 (four) times daily as needed for dizziness.    Historical Provider, MD  megestrol (MEGACE) 40 MG/ML suspension Take 400 mg by mouth daily.  02/08/16   Historical Provider, MD  metoprolol tartrate (LOPRESSOR) 25 MG tablet Take 0.5 tablets (12.5 mg total) by mouth 2 (two) times daily. 09/03/15   Kathie Dike, MD  multivitamin (RENA-VIT) TABS tablet Take 1 tablet by mouth at bedtime. 07/18/13   Blain Pais, MD  nitroGLYCERIN (NITROSTAT) 0.4 MG SL tablet Place 1 tablet (0.4 mg total) under the tongue every 5 (five) minutes as needed for chest pain.  10/24/15   Smiley Houseman, MD  omeprazole (PRILOSEC) 20 MG capsule Take 20 mg by mouth daily as needed (acid reflux).     Historical Provider, MD  ondansetron (ZOFRAN) 4 MG tablet Take 4 mg by mouth every 8 (eight) hours as needed for nausea or vomiting.  12/16/14   Historical Provider, MD  polyethylene glycol (MIRALAX / GLYCOLAX) packet Take 17 g by mouth daily.    Historical Provider, MD  PROAIR HFA 108 [phone] Base) MCG/ACT inhaler Inhale 1-2 puffs into the lungs every 6 (six) hours as needed for wheezing or shortness of breath.  02/12/16   Historical Provider, MD  sevelamer carbonate (RENVELA) 800 MG tablet Take 1,600-2,400 mg by mouth 3 (three) times daily with meals. 3 with meals and 2 with snacks    Historical Provider, MD  Thyroid 97.5 MG TABS Take 243.75 mg by mouth daily. 2 and 1/2 tablet once daily    Historical Provider, MD  zolpidem (AMBIEN) 10 MG tablet Take 10 mg by mouth at bedtime.     Historical Provider, MD    Family History Family History  Problem Relation Age of Onset  . COPD Mother   . Thyroid disease Mother   . Multiple sclerosis Father   . Heart disease Father   . Heart attack Father   . Depression Brother     Suicide  . Alcohol abuse Brother   . Heart disease Brother   . Asthma Brother    . Thyroid disease Daughter   . Diabetes Maternal Aunt     Social History Social History  Substance Use Topics  . Smoking status: Current Some Day Smoker    Packs/day: 0.50    Years: 40.00    Types: Cigarettes, E-cigarettes    Start date: 08/05/1969  . Smokeless tobacco: Never Used  . Alcohol use No     Allergies   Doxycycline; Iodine; and Lipitor [atorvastatin]   Review of Systems Review of Systems  Constitutional: Negative for fever.  Musculoskeletal: Negative for back pain.  Allergic/Immunologic: Negative for immunocompromised state.  All other systems reviewed and are negative.    Physical Exam Updated Vital Signs BP (!) 76/53   Pulse (!) 130   Temp 97.6 F (36.4 C) (Oral)   Resp (!) 28   SpO2 94%   Physical Exam  Constitutional: She appears well-developed and well-nourished. She appears distressed.  HENT:  Head: Normocephalic and atraumatic.  Eyes: Conjunctivae are normal.  Neck: Neck supple.  Cardiovascular:  Tachycardic, Afib on monitor  Pulmonary/Chest: Effort normal and breath sounds normal. No respiratory distress.  Abdominal: Soft. She exhibits no distension. There is no tenderness.  Musculoskeletal: She exhibits no edema (of LEs).  Neurological: She is alert.  Skin: Skin is warm and dry.  Psychiatric:  distressed  Nursing note and vitals reviewed.    ED Treatments / Results  Labs (all labs ordered are listed, but only abnormal results are displayed) Labs Reviewed  CBC WITH DIFFERENTIAL/PLATELET  APTT  PROTIME-INR  I-STAT CHEM 8, ED  I-STAT TROPOININ, ED    EKG  EKG Interpretation None       Radiology No results found.  Procedures Procedures (including critical care time)  Angiocath insertion Performed by: Allyson Sabal  Consent: Verbal consent obtained. Risks and benefits: risks, benefits and alternatives were discussed Time out: Immediately prior to procedure a "time out" was called to verify the correct patient,  procedure, equipment, support staff and site/side marked as required.  Preparation: Patient was prepped  and draped in the usual sterile fashion.  Vein Location: R EJ  Not Ultrasound Guided  Gauge: 20  Normal blood return and flush without difficulty Patient tolerance: Patient tolerated the procedure well with no immediate complications.    Medications Ordered in ED Medications  fentaNYL (SUBLIMAZE) injection 25 mcg (not administered)  sodium chloride 0.9 % bolus 1,000 mL (not administered)  aspirin chewable tablet 324 mg (324 mg Oral Given 10/21/16 1512)  sodium chloride 0.9 % bolus 1,000 mL (1,000 mLs Intravenous New Bag/Given 10/21/16 1518)  fentaNYL (SUBLIMAZE) injection 50 mcg (50 mcg Intravenous Given 10/21/16 1510)     Initial Impression / Assessment and Plan / ED Course  I have reviewed the triage vital signs and the nursing notes.  Pertinent labs & imaging results that were available during my care of the patient were reviewed by me and considered in my medical decision making (see chart for details).     Code STEMI, aspirin administered Patient is mentating well but appears distressed Hypotensive and fluids administered Patient transported to the cath lab after pads placed, access obtained, basic labs drawn Heparin bolus held per cardiology recs  Final Clinical Impressions(s) / ED Diagnoses   Final diagnoses:  Acute ST elevation myocardial infarction (STEMI) involving other coronary artery of inferior wall [facility])    New Prescriptions New Prescriptions   No medications on file     Karma Greaser, MD 10/21/16 [place], MD 10/22/16 [id number]

## 2016-10-21 NOTE — ED Triage Notes (Signed)
Pt to ED via [facility] with c/o chest pain.  STEMI was called PTA.  Pt was at dialysis when her central chest pain started radiating down the left arm.

## 2016-10-21 NOTE — Progress Notes (Signed)
Removed HD needles from left upper arm fistula. Pressured held 10 minutes per each site until bleeding had stopped. Pressure dressing applied. Tolerated well.

## 2016-10-21 NOTE — H&P (Signed)
History and Physical  Patient ID: Anita [name] MRN: [id number], SOB: May 01, 1951 66 y.o. Date of Encounter: 10/21/2016, 3:23 PM  Primary Physician: Robert Bellow, MD Primary Cardiologist: none  Chief Complaint: Chest pain  HPI: 66 y.o. female w/ PMHx significant for ESRD on hemodialysis, severe COPD on home O2 who presented to [facility] on 10/21/2016 with complaints of chest pain.  She apparently missed dialysis the past few days because of diarrhea. Today during dialysis she developed severe substernal and left sided chest pain. EMS was called and the initial EKG is consistent with an acute inferior STEMI.  The patient lives independently with her husband. Some of the history is obtained from him over the telephone. She is not active but she is still able to get around. There are some notes about severe aortic stenosis but he does not have any information on this. The patient has been compliant with requests for anticoagulation in the setting of atrial fibrillation.  At the time of my evaluation in the emergency department, the patient is somnolent but arousable. She is hypotensive with a blood pressure in the low 70s. She complains of chest pain when questioned about this. Unable to elicit further details. She is also complaining of shortness of breath when she lies down flat. States that her pain started to dialysis and got much worse during dialysis today.  Past Medical History:  Diagnosis Date  . Anemia   . Aortic stenosis 12/23/2012  . Atherosclerosis of aorta ([place])   . Atrial fibrillation ([place])    Diagnosed 11/14 of undetermined age of onset    . CAD (coronary artery disease) 07/13/2013   Catheterization 5 years ago by Dr. Elisabeth Cara with reportedly nonobstructive disease Calcification noted on CT scan of chest in November of 2014   . CHF (congestive heart failure) ([place])   . Chronic diastolic heart failure ([place])   . COPD (chronic obstructive pulmonary disease) ([place])     . 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 Diabetic nephropathy ([place])   . Diabetic neuropathy ([place])   . Dyslipidemia   . ESRD on hemodialysis ([place])   . GERD (gastroesophageal reflux disease)   . Hypertension   . Laryngeal mass   . Neuropathy due to secondary diabetes ([place])   . PONV (postoperative nausea and vomiting)   . 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 Shortness of breath dyspnea    with exertion  . Sleep apnea    Does not use CPAP- due to weight loss  . Thyroid neoplasm    of uncertain behavior  . Wears glasses      Surgical History:  Past Surgical History:  Procedure Laterality Date  . ACHILLES TENDON REPAIR Right   . APPENDECTOMY    . AV FISTULA PLACEMENT Left 12/29/2012   Procedure: ARTERIOVENOUS (AV) FISTULA CREATION;  Surgeon: Elam Dutch, MD;  Location: [facility] OR;  Service: Vascular;  Laterality: Left;  Creation Left Brachial Cephalic Arteriovenous Fistula  . CARDIAC CATHETERIZATION    . CESAREAN SECTION     X 2   . CHOLECYSTECTOMY    . Elbow tendon surgery Right   . HEMORRHOID SURGERY     many years ago  . LARYNGECTOMY     mass removed- noncancerous  . LUMBAR LAMINECTOMY     lower back  . SHOULDER ARTHROSCOPY Right    w/ repair of rotator cuff repair  . THYROIDECTOMY N/A 10/19/2014   Procedure:  TOTAL THYROIDECTOMY;  Surgeon: Armandina Gemma, MD;  Location: [facility];  Service: General;  Laterality: N/A;     Home Meds: Prior to Admission medications   Medication Sig Start Date End Date Taking? Authorizing Provider  ALPRAZolam Duanne Moron) 1 MG tablet Take 0.5-1 mg by mouth 4 (four) times daily. Patient takes 1/2 tablet 2-3 times daily and 1 tablet at bedtime    Historical Provider, MD  apixaban (ELIQUIS) 2.5 MG TABS tablet Take 1 tablet (2.5 mg total) by mouth 2 (two) times daily. 04/09/16   Erline Hau, MD  bisacodyl (DULCOLAX) 10 MG suppository Place 1 suppository (10 mg total) rectally daily as needed for moderate constipation.  02/16/16   Kathie Dike, MD  Cyanocobalamin (VITAMIN B 12 PO) Take 1,000 mg by mouth daily.    Historical Provider, MD  diltiazem (CARDIZEM CD) 240 MG 24 hr capsule TAKE (1) CAPSULE BY MOUTH AT BEDTIME. 04/03/16   Arnoldo Lenis, MD  hydrALAZINE (APRESOLINE) 100 MG tablet Take 100 mg by mouth 3 (three) times daily. 11/29/15   Historical Provider, MD  HYDROcodone-acetaminophen (NORCO) 7.5-325 MG per tablet Take 1 tablet by mouth every 6 (six) hours as needed for pain. Patient taking differently: Take 1-2 tablets by mouth every 6 (six) hours as needed for moderate pain or severe pain.  02/15/13   Regina J Roczniak, PA-C  ipratropium-albuterol (DUONEB) 0.5-2.5 (3) MG/3ML SOLN Take 3 mLs by nebulization every 4 (four) hours as needed. Patient taking differently: Take 3 mLs by nebulization 2 (two) times daily. *May take up to 4 times daily* 09/03/15   Kathie Dike, MD  levofloxacin (LEVAQUIN) 500 MG tablet Take 1 tablet (500 mg total) by mouth every other day. 04/09/16   Erline Hau, MD  meclizine (ANTIVERT) 25 MG tablet Take 25 mg by mouth 4 (four) times daily as needed for dizziness.    Historical Provider, MD  megestrol (MEGACE) 40 MG/ML suspension Take 400 mg by mouth daily.  02/08/16   Historical Provider, MD  metoprolol tartrate (LOPRESSOR) 25 MG tablet Take 0.5 tablets (12.5 mg total) by mouth 2 (two) times daily. 09/03/15   Kathie Dike, MD  multivitamin (RENA-VIT) TABS tablet Take 1 tablet by mouth at bedtime. 07/18/13   Blain Pais, MD  nitroGLYCERIN (NITROSTAT) 0.4 MG SL tablet Place 1 tablet (0.4 mg total) under the tongue every 5 (five) minutes as needed for chest pain. 10/24/15   Smiley Houseman, MD  omeprazole (PRILOSEC) 20 MG capsule Take 20 mg by mouth daily as needed (acid reflux).     Historical Provider, MD  ondansetron (ZOFRAN) 4 MG tablet Take 4 mg by mouth every 8 (eight) hours as needed for nausea or vomiting.  12/16/14   Historical Provider, MD  polyethylene  glycol (MIRALAX / GLYCOLAX) packet Take 17 g by mouth daily.    Historical Provider, MD  PROAIR HFA 108 [phone] Base) MCG/ACT inhaler Inhale 1-2 puffs into the lungs every 6 (six) hours as needed for wheezing or shortness of breath.  02/12/16   Historical Provider, MD  sevelamer carbonate (RENVELA) 800 MG tablet Take 1,600-2,400 mg by mouth 3 (three) times daily with meals. 3 with meals and 2 with snacks    Historical Provider, MD  Thyroid 97.5 MG TABS Take 243.75 mg by mouth daily. 2 and 1/2 tablet once daily    Historical Provider, MD  zolpidem (AMBIEN) 10 MG tablet Take 10 mg by mouth at bedtime.     Historical Provider, MD  Allergies:  Allergies  Allergen Reactions  . Doxycycline Nausea And Vomiting  . Iodine   . Lipitor [Atorvastatin] Nausea And Vomiting    Social History   Social History  . Marital status: Married    Spouse name: N/A  . Number of children: N/A  . Years of education: N/A   Occupational History  . 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 Hair dresser    Social History Main Topics  . Smoking status: Current Some Day Smoker    Packs/day: 0.50    Years: 40.00    Types: Cigarettes, E-cigarettes    Start date: 08/05/1969  . Smokeless tobacco: Never Used  . Alcohol use No  . Drug use: No  . Sexual activity: Not Currently    Birth control/ protection: Post-menopausal   Other Topics Concern  . Not on file   Social History Narrative   66 yo woman who lives at home with her husband of 61 years. She has a 40 pack year history of smoking, denies etoh or illicit drug use. 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      Family History  Problem Relation Age of Onset  . COPD Mother   . Thyroid disease Mother   . Multiple sclerosis Father   . Heart disease Father   . Heart attack Father   . Depression Brother     Suicide  . Alcohol abuse Brother   .  Heart disease Brother   . Asthma Brother   . Thyroid disease Daughter   . Diabetes Maternal Aunt     Review of Systems: Unable to obtain because of the patient's acute illness and lethargy  Physical Exam: Blood pressure (!) 76/53, pulse (!) 130, temperature 97.6 F (36.4 C), temperature source Oral, resp. rate (!) 28, SpO2 94 %. General: Chronically ill-appearing woman in moderate distress secondary to chest discomfort HEENT: Normocephalic, atraumatic, sclera anicteric Neck: Supple. Carotids diminished Lungs: Decreased breath sounds throughout Heart: Irregular and tachycardic with grade 3/6 harsh late peaking systolic murmur, no diastolic murmur Abdomen: Soft, non-tender no masses or obvious  Back: No CVA tenderness Msk:  No deformity Extremities: No clubbing, cyanosis, or edema.  Distal pedal pulses are 2+ and equal bilaterally. Neuro: CNII-XII intact, moves all extremities spontaneously. Psych:  Responds to questions appropriately  Skin: warm and dry without rash   Labs:   Lab Results  Component Value Date   WBC 7.7 04/09/2016   HGB 8.6 (L) 04/09/2016   HCT 27.7 (L) 04/09/2016   MCV 85.2 04/09/2016   PLT 294 04/09/2016   No results for input(s): NA, K, CL, CO2, BUN, CREATININE, CALCIUM, PROT, BILITOT, ALKPHOS, ALT, AST, GLUCOSE in the last 168 hours.  Invalid input(s): LABALBU No results for input(s): CKTOTAL, CKMB, TROPONINI in the last 72 hours. No results found for: CHOL, HDL, LDLCALC, TRIG No results found for: DDIMER  Radiology/Studies:  No results found.   EKG: Atrial fibrillation with rapid ventricular rate, with acute inferior STEMI  Cardiac studies: Pending  ASSESSMENT AND PLAN:  66 year old chronically low woman with multiple medical problems presents with an acute inferior STEMI complicated by cardiogenic shock. Other comorbid conditions include severe COPD on home oxygen, moderate to severe aortic stenosis as assessed by previous echo, atrial  fibrillation anticoagulated with apixaban, and end-stage renal disease on hemodialysis.  This is a  very difficult situation. The patient is at very high risk of mortality in the hospital. She is in cardiogenic shock in the setting of an acute infarction with a background of severe comorbid conditions outlined above. She continues to smoke on home oxygen with severe so underlying COPD. I discussed the situation with her husband over the telephone. Also discussed situation with the patient the best I could. Seems reasonable to try to help her acute situation with an emergency catheterization and possible PCI to help alleviate chest pain. I don't think she will be a candidate for advanced therapies. There is a high risk she will require intubation and also Problems related to shock with expectant multiorgan dysfunction/failure. The patient's husband understands and requests that we do the best we can for her. Emergency implied consent is obtained from the patient. Further disposition pending cardiac catheterization results.  Deatra James MD 10/21/2016, 3:23 PM

## 2016-10-21 NOTE — Progress Notes (Signed)
eLink Physician-Brief Progress Note Patient Name: Anita [name] DOB: 1950-12-25 MRN: [id number]   Date of Service  10/21/2016  HPI/Events of Note  Post cath, likely embolic, no intervention needed Camera NOT work in room  eICU Interventions  Vitals WNL Lab chem reviewed 3/6     Intervention Category Evaluation Type: New Patient Evaluation  Raylene Miyamoto. 10/21/2016, 5:38 PM

## 2016-10-22 ENCOUNTER — Encounter (HOSPITAL_COMMUNITY): Payer: Self-pay | Admitting: Cardiovascular Disease

## 2016-10-22 LAB — RENAL FUNCTION PANEL
Albumin: 2.9 g/dL — ABNORMAL LOW (ref 3.5–5.0)
Anion gap: 9 (ref 5–15)
BUN: 45 mg/dL — ABNORMAL HIGH (ref 6–20)
CO2: 32 mmol/L (ref 22–32)
Calcium: 8.3 mg/dL — ABNORMAL LOW (ref 8.9–10.3)
Chloride: 97 mmol/L — ABNORMAL LOW (ref 101–111)
Creatinine, Ser: 4.48 mg/dL — ABNORMAL HIGH (ref 0.44–1.00)
GFR calc Af Amer: 11 mL/min — ABNORMAL LOW (ref >60)
GFR calc non Af Amer: 9 mL/min — ABNORMAL LOW (ref >60)
Glucose, Bld: 98 mg/dL (ref 65–99)
Phosphorus: 6.1 mg/dL — ABNORMAL HIGH (ref 2.5–4.6)
Potassium: 4.2 mmol/L (ref 3.5–5.1)
Sodium: 138 mmol/L (ref 135–145)

## 2016-10-22 LAB — CBC
HEMATOCRIT: 29.8 % — AB (ref 36.0–46.0)
Hemoglobin: 8.9 g/dL — ABNORMAL LOW (ref 12.0–15.0)
MCH: 27.2 pg (ref 26.0–34.0)
MCHC: 29.9 g/dL — AB (ref 30.0–36.0)
MCV: 91.1 fL (ref 78.0–100.0)
PLATELETS: 97 10*3/uL — AB (ref 150–400)
RBC: 3.27 MIL/uL — ABNORMAL LOW (ref 3.87–5.11)
RDW: 14.4 % (ref 11.5–15.5)
WBC: 3 10*3/uL — AB (ref 4.0–10.5)

## 2016-10-22 LAB — LIPID PANEL
CHOL/HDL RATIO: 1.7 ratio
Cholesterol: 111 mg/dL (ref 0–200)
HDL: 66 mg/dL (ref >40)
LDL Cholesterol: 35 mg/dL (ref 0–99)
Triglycerides: 48 mg/dL (ref <150)
VLDL: 10 mg/dL (ref 0–40)

## 2016-10-22 LAB — TROPONIN I
TROPONIN I: 2.41 ng/mL — AB (ref <0.03)
Troponin I: 3.2 ng/mL (ref <0.03)

## 2016-10-22 LAB — APTT: aPTT: 53 seconds — ABNORMAL HIGH (ref 24–36)

## 2016-10-22 LAB — HEPARIN LEVEL (UNFRACTIONATED): Heparin Unfractionated: 0.92 IU/mL — ABNORMAL HIGH (ref 0.30–0.70)

## 2016-10-22 MED ORDER — CALCITRIOL 0.25 MCG PO CAPS
0.2500 ug | ORAL_CAPSULE | ORAL | Status: DC
  Administered 2016-10-22: 0.25 ug via ORAL
  Filled 2016-10-22: qty 1

## 2016-10-22 MED ORDER — ROSUVASTATIN CALCIUM 10 MG PO TABS
20.0000 mg | ORAL_TABLET | Freq: Every day | ORAL | Status: DC
  Administered 2016-10-22 – 2016-10-23 (×2): 20 mg via ORAL
  Filled 2016-10-22 (×2): qty 1
  Filled 2016-10-22: qty 2

## 2016-10-22 MED ORDER — MORPHINE SULFATE (PF) 2 MG/ML IV SOLN
2.0000 mg | INTRAVENOUS | Status: DC | PRN
  Administered 2016-10-22: 4 mg via INTRAVENOUS
  Administered 2016-10-22 – 2016-10-23 (×3): 2 mg via INTRAVENOUS
  Administered 2016-10-23: 4 mg via INTRAVENOUS
  Administered 2016-10-23: 2 mg via INTRAVENOUS
  Administered 2016-10-24: 4 mg via INTRAVENOUS
  Administered 2016-10-25: 2 mg via INTRAVENOUS
  Filled 2016-10-22: qty 2
  Filled 2016-10-22 (×4): qty 1
  Filled 2016-10-22: qty 2
  Filled 2016-10-22: qty 1
  Filled 2016-10-22: qty 2

## 2016-10-22 MED ORDER — DARBEPOETIN ALFA 100 MCG/0.5ML IJ SOSY
PREFILLED_SYRINGE | INTRAMUSCULAR | Status: AC
  Administered 2016-10-22: 100 ug via INTRAVENOUS
  Filled 2016-10-22: qty 0.5

## 2016-10-22 MED ORDER — LEVOTHYROXINE SODIUM 75 MCG PO TABS
175.0000 ug | ORAL_TABLET | Freq: Every day | ORAL | Status: DC
  Administered 2016-10-23 – 2016-10-25 (×3): 175 ug via ORAL
  Filled 2016-10-22 (×3): qty 1

## 2016-10-22 MED ORDER — SODIUM CHLORIDE 0.9 % IV SOLN: 100.0000 mL | INTRAVENOUS | Status: DC | PRN

## 2016-10-22 MED ORDER — APIXABAN 2.5 MG PO TABS
2.5000 mg | ORAL_TABLET | Freq: Two times a day (BID) | ORAL | Status: DC
  Administered 2016-10-22: 2.5 mg via ORAL
  Filled 2016-10-22: qty 1

## 2016-10-22 MED ORDER — PENTAFLUOROPROP-TETRAFLUOROETH EX AERO: 1.0000 "application " | INHALATION_SPRAY | CUTANEOUS | Status: DC | PRN

## 2016-10-22 MED ORDER — APIXABAN 2.5 MG PO TABS: 2.5000 mg | ORAL_TABLET | Freq: Two times a day (BID) | ORAL | Status: DC

## 2016-10-22 MED ORDER — LIDOCAINE HCL (PF) 1 % IJ SOLN: 5.0000 mL | INTRAMUSCULAR | Status: DC | PRN

## 2016-10-22 MED ORDER — LIDOCAINE-PRILOCAINE 2.5-2.5 % EX CREA: 1.0000 "application " | TOPICAL_CREAM | CUTANEOUS | Status: DC | PRN

## 2016-10-22 MED ORDER — MORPHINE SULFATE (PF) 4 MG/ML IV SOLN
INTRAVENOUS | Status: AC
  Administered 2016-10-22: 4 mg
  Filled 2016-10-22: qty 1

## 2016-10-22 MED ORDER — AMIODARONE HCL 200 MG PO TABS
400.0000 mg | ORAL_TABLET | Freq: Two times a day (BID) | ORAL | Status: DC
  Administered 2016-10-22 – 2016-10-25 (×7): 400 mg via ORAL
  Filled 2016-10-22 (×7): qty 2

## 2016-10-22 MED ORDER — DARBEPOETIN ALFA 100 MCG/0.5ML IJ SOSY
100.0000 ug | PREFILLED_SYRINGE | INTRAMUSCULAR | Status: DC
  Administered 2016-10-22: 100 ug via INTRAVENOUS
  Filled 2016-10-22: qty 0.5

## 2016-10-22 MED FILL — Amiodarone HCl Inj 150 MG/3ML (50 MG/ML): INTRAVENOUS | Qty: 3 | Status: AC

## 2016-10-22 NOTE — Progress Notes (Addendum)
[place] for heparin/apixaban Indication: ACS/STEMI  Allergies  Allergen Reactions  . Doxycycline Nausea And Vomiting  . Iodine Other (See Comments)    Cannot take; had thyroid cancer  . Lipitor [Atorvastatin] Nausea And Vomiting  . Tape Other (See Comments)    THE PATIENT'S SKIN IS PAPER-THIN AND TEARS AND BRUISES VERY EASILY!!    Patient Measurements: Height: 5\' 5"  (165.1 cm) Weight: 140 lb 10.5 oz (63.8 kg) IBW/kg (Calculated) : 5757.6 kg 03/2016 169 cm 03/2016  Vital Signs: Temp: 98.2 F (36.8 C) (03/07 0700) Temp Source: Oral (03/07 0700) BP: 128/72 (03/07 0800) Pulse Rate: 58 (03/07 0800)  Labs:  Recent Labs  10/21/16 1502  10/21/16 1520 10/21/16 1526 10/21/16 2000 10/22/16 0200 10/22/16 0837  HGB  --   < > 9.5* 10.9*  --   --  8.9*  HCT  --   --  32.0* 32.0*  --   --  29.8*  PLT  --   --  100*  --   --   --  97*  APTT 34  --   --   --   --   --  53*  LABPROT 15.0  --   --   --   --   --   --   INR 1.17  --   --   --   --   --   --   HEPARINUNFRC  --   --   --   --   --   --  0.92*  CREATININE  --   --   --  3.60*  --   --   --   TROPONINI  --   --   --   --  1.09* 3.20*  --   < > = values in this interval not displayed.  Estimated Creatinine Clearance: 13.6 mL/min (by C-G formula based on SCr of 3.6 mg/dL (H)).   Medical History: Past Medical History:  Diagnosis Date  . Acute ST elevation myocardial infarction (STEMI) of inferior wall ([place]) 10/21/2016  . Anemia   . Aortic stenosis 12/23/2012  . Atherosclerosis of aorta ([place])   . Atrial fibrillation ([place])    Diagnosed 11/14 of undetermined age of onset    . CAD (coronary artery disease) 07/13/2013   Catheterization 5 years ago by Dr. Elisabeth Cara with reportedly nonobstructive disease Calcification noted on CT scan of chest in November of 2014   . CHF (congestive heart failure) ([place])   . Chronic diastolic heart failure ([place])   . COPD (chronic obstructive pulmonary  disease) ([place])   . 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 Diabetic nephropathy ([place])   . Diabetic neuropathy ([place])   . Dyslipidemia   . ESRD on hemodialysis ([place])   . GERD (gastroesophageal reflux disease)   . Hypertension   . Laryngeal mass   . Neuropathy due to secondary diabetes ([place])   . PONV (postoperative nausea and vomiting)   . 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 Shortness of breath dyspnea    with exertion  . Sleep apnea    Does not use CPAP- due to weight loss  . Thyroid neoplasm    of uncertain behavior  . Wears glasses     Assessment: 66 y/o female with ESRD on HD MWF when she developed chest pain. She was brought in as a STEMI and taken to the cath lab. Pharmacy consulted to begin a heparin drip.  Of note, she takes apixaban 2.5 mg PO bid PTA for Afib, last dose per husband was 3/6 09:00.  She is s/p cath - probable embolic event involving RCA, heavily calcified nonobstructive CAD involving left main, LAD, LCx, porcelain aorta. No percutaneous options for CAD per cardiology.  Patient continues on heparin gtt. Pharmacy now consulted to restart apixaban. Heparin level and aPTT still not correlative. Heparin subtherapeutic based on aPTT. No signs or symptoms of bleeding noted. Hgb down in the absence of bleeding - nephrology restart ESA today. PLTC low x2.   Goal of Therapy:  Heparin level 0.3-0.7 units/ml aPTT 66-102 seconds Monitor platelets by anticoagulation protocol: Yes   Plan:  - Increase heparin gtt to 1050 units/hr - Eliquis 2.5mg  bid at 1700 - CBC Q72h

## 2016-10-22 NOTE — Progress Notes (Signed)
Progress Note  Patient Name: Anita [name] Date of Encounter: 10/22/2016  Primary Cardiologist: none  Subjective   C/o worsening of her chronic pain - neck/back. No further chest pain. No shortness of breath.   Inpatient Medications    Scheduled Meds: . aspirin  81 mg Oral Daily  . mouth rinse  15 mL Mouth Rinse BID  . megestrol  400 mg Oral Daily  . metoprolol tartrate  25 mg Oral BID  . multivitamin  1 tablet Oral QHS  . pantoprazole  40 mg Oral Daily  . polyethylene glycol  17 g Oral Daily  . sevelamer carbonate  3,200 mg Oral TID WC  . sodium chloride  1,000 mL Intravenous Once  . sodium chloride flush  3 mL Intravenous Q12H   Continuous Infusions: . amiodarone 30 mg/hr (10/22/16 0030)  . heparin 900 Units/hr (10/22/16 0051)   PRN Meds: sodium chloride, acetaminophen, ALPRAZolam, ALPRAZolam, bisacodyl, HYDROcodone-acetaminophen, meclizine, morphine injection, nitroGLYCERIN, ondansetron (ZOFRAN) IV, sevelamer carbonate, sodium chloride flush, zolpidem   Vital Signs    Vitals:   10/22/16 0500 10/22/16 0600 10/22/16 0700 10/22/16 0800  BP: 127/63 135/68  128/72  Pulse: (!) 57 (!) 57  (!) 58  Resp: 17 17  17   Temp:   98.2 F (36.8 C)   TempSrc:   Oral   SpO2: 98% 100%  100%  Weight:      Height:        Intake/Output Summary (Last 24 hours) at 10/22/16 0830 Last data filed at 10/22/16 0600  Gross per 24 hour  Intake           404.44 ml  Output                0 ml  Net           404.44 ml   Filed Weights   10/21/16 1722  Weight: 140 lb 10.5 oz (63.8 kg)    Telemetry    Sinus rhythm - Personally Reviewed  ECG    Wandering baseline limits interpretation, NSR, resolution of inferior ST elevation - Personally Reviewed  Physical Exam  Elderly appearing woman GEN: No acute distress.   Neck: No JVD - ecchymoses left neck Cardiac: RRR, 3/6 harsh late peaking systolic murmur LSB  Respiratory: Coarse BS bilaterally. GI: Soft, nontender, non-distended    MS: No edema; No deformity. Right groin site clear Neuro:  Nonfocal  Psych: Normal affect   Labs    Chemistry Recent Labs Lab 10/21/16 1526  NA 137  K 4.0  CL 100*  GLUCOSE 116*  BUN 44*  CREATININE 3.60*     Hematology Recent Labs Lab 10/21/16 1520 10/21/16 1526  WBC 3.9*  --   RBC 3.52*  --   HGB 9.5* 10.9*  HCT 32.0* 32.0*  MCV 90.9  --   MCH 27.0  --   MCHC 29.7*  --   RDW 14.3  --   PLT 100*  --     Cardiac Enzymes Recent Labs Lab 10/21/16 2000 10/22/16 0200  TROPONINI 1.09* 3.20*    Recent Labs Lab 10/21/16 1525  TROPIPOC 0.08     BNPNo results for input(s): BNP, PROBNP in the last 168 hours.   DDimer No results for input(s): DDIMER in the last 168 hours.   Radiology    No results found.  Cardiac Studies   2D Echo pending  Patient Profile     66 y.o. female with multiple severe medical problems who presents  with an acute inferior MI secondary to coronary embolism  Assessment & Plan    1. Paroxysmal atrial fibrillation: pt in AF with RVR at presentation, now converted to sinus rhtyhm on IV amiodarone. Was treated with Eliquis - while limited data on Eliquis in ESRD  Population, she had severe bleeding on warfarin an absolutely will not take this again. Will resume eliquis 2.5 mg BID. Convert amiodarone to oral 400 mg BID.  2. Acute inferior MI: secondary to embolism. Treat with ASA, beta-blocker. No ACE with ESRD. Statin-intolerant.  3. Aortic stenosis, suspect severe by exam. Echo today. Not sure she will be a candidate for treatment.   4. Acute on chronic pain syndrome. Refractory to hydrocodone. Will give morphine as needed  5. ESRD: appreciate renal team seeing her  Signed, Sherren Mocha, MD  10/22/2016, 8:30 AM

## 2016-10-22 NOTE — Consult Note (Signed)
[facility] Renal Consultation Note    Indication for Consultation:  Management of ESRD/hemodialysis; anemia, hypertension/volume and secondary hyperparathyroidism PCP:  HPI: Anita [name] is a 66 y.o. female with ESRD on hemodialysis MWF at [facility]. PHM significant for severe COPD (she continues to smoke on home O2)  aortic stenosis, hypertension, atrial fib on eliquis, CAD, CHF, DMT2, chronic diastolic HF EF 97-98% 92/11/94, GERD, dyslipidemia, thyroid neoplasm.   Patient missed hemodialysis Monday due to diarrhea but rescheduled treatment for Tuesday. Patient developed substernal chest pain 2 hours in hemodialysis 10/21/16 with associated hypotension, tachycardia. EMS was called, EKG showed evidence of acute inferior MI. She was transported to [facility] for evaluation. She was seen by Dr. Burt Knack and taken to cath lab. She presented with inferior MI and symptoms of cardiogenic shock, however no lesion was seen in  RCA. MI is thought to be due to probable embolic event. She was heavily calcified nonobstructive disease in L main, LAD and L Circ, "porcelain aorta" and severely calcified aortic valve. She was started on amiodarone for rate control of AFIB, started on heparin drip now transitioning back to Eliquis. Troponin peaked at 3.2.   Currently patient is sitting up on side of bed eating breakfast. She C/O neck pain but denies chest pain, SOB, palpitations. She has been made a DNR but wishes to continue hemodialysis. She says she struggles through hemodialysis and that "dialysis is so hard on my body but I don't want to die". Patient has had past issues with compliance to hemodialysis prescription but this has improved and she has been coming 3 days a week, usually staying full treatment.   Past Medical History:  Diagnosis Date  . Acute ST elevation myocardial infarction (STEMI) of inferior wall ([place]) 10/21/2016  . Anemia   . Aortic stenosis 12/23/2012  . Atherosclerosis  of aorta ([place])   . Atrial fibrillation ([place])    Diagnosed 11/14 of undetermined age of onset    . CAD (coronary artery disease) 07/13/2013   Catheterization 5 years ago by Dr. Elisabeth Cara with reportedly nonobstructive disease Calcification noted on CT scan of chest in November of 2014   . CHF (congestive heart failure) ([place])   . Chronic diastolic heart failure ([place])   . COPD (chronic obstructive pulmonary disease) ([place])   . 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 Diabetic nephropathy ([place])   . Diabetic neuropathy ([place])   . Dyslipidemia   . ESRD on hemodialysis ([place])   . GERD (gastroesophageal reflux disease)   . Hypertension   . Laryngeal mass   . Neuropathy due to secondary diabetes ([place])   . PONV (postoperative nausea and vomiting)   . 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 Shortness of breath dyspnea    with exertion  . Sleep apnea    Does not use CPAP- due to weight loss  . Thyroid neoplasm    of uncertain behavior  . Wears glasses    Past Surgical History:  Procedure Laterality Date  . ACHILLES TENDON REPAIR Right   . APPENDECTOMY    . AV FISTULA PLACEMENT Left 12/29/2012   Procedure: ARTERIOVENOUS (AV) FISTULA CREATION;  Surgeon: Elam Dutch, MD;  Location: [facility] OR;  Service: Vascular;  Laterality: Left;  Creation Left Brachial Cephalic Arteriovenous Fistula  . CARDIAC CATHETERIZATION    . CESAREAN SECTION     X 2   . CHOLECYSTECTOMY    . Elbow tendon surgery Right   .  HEMORRHOID SURGERY     many years ago  . LARYNGECTOMY     mass removed- noncancerous  . LEFT HEART CATH AND CORONARY ANGIOGRAPHY N/A 10/21/2016   Procedure: Left Heart Cath and Coronary Angiography;  Surgeon: Sherren Mocha, MD;  Location: [facility] CV LAB;  Service: Cardiovascular;  Laterality: N/A;  . LUMBAR LAMINECTOMY     lower back  . SHOULDER ARTHROSCOPY Right    w/ repair of rotator cuff repair  . THYROIDECTOMY N/A 10/19/2014   Procedure: TOTAL THYROIDECTOMY;  Surgeon: Armandina Gemma, MD;  Location: [facility] OR;  Service: General;  Laterality: N/A;   Family History  Problem Relation Age of Onset  . COPD Mother   . Thyroid disease Mother   . Multiple sclerosis Father   . Heart disease Father   . Heart attack Father   . Depression Brother     Suicide  . Alcohol abuse Brother   . Heart disease Brother   . Asthma Brother   . Thyroid disease Daughter   . Diabetes Maternal Aunt    Social History:  reports that she has been smoking Cigarettes and E-cigarettes.  She started smoking about 47 years ago. She has a 20.00 pack-year smoking history. She has never used smokeless tobacco. She reports that she does not drink alcohol or use drugs. Allergies  Allergen Reactions  . Doxycycline Nausea And Vomiting  . Iodine Other (See Comments)    Cannot take; had thyroid cancer  . Lipitor [Atorvastatin] Nausea And Vomiting  . Tape Other (See Comments)    THE PATIENT'S SKIN IS PAPER-THIN AND TEARS AND BRUISES VERY EASILY!!   Prior to Admission medications   Medication Sig Start Date End Date Taking? Authorizing Provider  albuterol (PROVENTIL) (2.5 MG/3ML) 0.083% nebulizer solution Take 2.5 mg by nebulization every 6 (six) hours.   Yes Historical Provider, MD  ALPRAZolam Duanne Moron) 1 MG tablet Take 1 mg by mouth 4 (four) times daily.    Yes Historical Provider, MD  apixaban (ELIQUIS) 2.5 MG TABS tablet Take 1 tablet (2.5 mg total) by mouth 2 (two) times daily. 04/09/16  Yes Erline Hau, MD  Cyanocobalamin (VITAMIN B 12 PO) Take 1,000 mg by mouth daily.   Yes Historical Provider, MD  diltiazem (CARDIZEM CD) 240 MG 24 hr capsule TAKE (1) CAPSULE BY MOUTH AT BEDTIME. 04/03/16  Yes Arnoldo Lenis, MD  hydrALAZINE (APRESOLINE) 100 MG tablet Take 100 mg by mouth 3 (three) times daily. 11/29/15  Yes Historical Provider, MD  HYDROcodone-acetaminophen (NORCO) 7.5-325 MG per tablet Take 1 tablet by mouth every 6 (six) hours as needed for pain. Patient taking differently: Take  1-2 tablets by mouth every 6 (six) hours as needed (for pain).  02/15/13  Yes Regina J Roczniak, PA-C  ipratropium (ATROVENT) 0.02 % nebulizer solution Take 0.5 mg by nebulization every 6 (six) hours.   Yes Historical Provider, MD  meclizine (ANTIVERT) 25 MG tablet Take 25 mg by mouth 4 (four) times daily as needed for dizziness.   Yes Historical Provider, MD  megestrol (MEGACE) 40 MG/ML suspension Take 400 mg by mouth daily.  02/08/16  Yes Historical Provider, MD  metoprolol tartrate (LOPRESSOR) 25 MG tablet Take 0.5 tablets (12.5 mg total) by mouth 2 (two) times daily. 09/03/15  Yes Kathie Dike, MD  multivitamin (RENA-VIT) TABS tablet Take 1 tablet by mouth at bedtime. 07/18/13  Yes Blain Pais, MD  nitroGLYCERIN (NITROSTAT) 0.4 MG SL tablet Place 1 tablet (0.4 mg total) under the  tongue every 5 (five) minutes as needed for chest pain. 10/24/15  Yes Smiley Houseman, MD  omeprazole (PRILOSEC) 20 MG capsule Take 20 mg by mouth daily as needed (acid reflux).    Yes Historical Provider, MD  ondansetron (ZOFRAN) 4 MG tablet Take 4 mg by mouth every 8 (eight) hours as needed for nausea or vomiting.  12/16/14  Yes Historical Provider, MD  polyethylene glycol (MIRALAX / GLYCOLAX) packet Take 17 g by mouth daily as needed for mild constipation.    Yes Historical Provider, MD  PROAIR HFA 108 [phone] Base) MCG/ACT inhaler Inhale 1-2 puffs into the lungs every 6 (six) hours as needed for wheezing or shortness of breath.  02/12/16  Yes Historical Provider, MD  sevelamer carbonate (RENVELA) 800 MG tablet Take 2,400-3,200 mg by mouth See admin instructions. 3,200 mg three times a day with meals and 2,400 mg two times a day as needed with snacks   Yes Historical Provider, MD  Thyroid (NATURE-THROID) 97.5 MG TABS Take 243.75 mg by mouth daily before breakfast.    Yes Historical Provider, MD  zolpidem (AMBIEN) 10 MG tablet Take 10 mg by mouth at bedtime.    Yes Historical Provider, MD  bisacodyl (DULCOLAX) 10 MG  suppository Place 1 suppository (10 mg total) rectally daily as needed for moderate constipation. Patient not taking: Reported on 10/21/2016 02/16/16   Kathie Dike, MD  calcium carbonate (OS-CAL) 1250 (500 Ca) MG chewable tablet Chew 1 tablet by mouth 2 (two) times daily as needed.    Historical Provider, MD  ipratropium-albuterol (DUONEB) 0.5-2.5 (3) MG/3ML SOLN Take 3 mLs by nebulization every 4 (four) hours as needed. Patient taking differently: Take 3 mLs by nebulization 4 (four) times daily. And prn 09/03/15   Kathie Dike, MD  levofloxacin (LEVAQUIN) 500 MG tablet Take 1 tablet (500 mg total) by mouth every other day. Patient not taking: Reported on 10/21/2016 04/09/16   Erline Hau, MD  levothyroxine (SYNTHROID, LEVOTHROID) 175 MCG tablet Take 175 mcg by mouth daily before breakfast.    Historical Provider, MD  liothyronine (CYTOMEL) 25 MCG tablet Take 25 mcg by mouth daily.    Historical Provider, MD   Current Facility-Administered Medications  Medication Dose Route Frequency Provider Last Rate Last Dose  . 0.9 %  sodium chloride infusion  250 mL Intravenous PRN Sherren Mocha, MD      . acetaminophen (TYLENOL) tablet 650 mg  650 mg Oral Q4H PRN Sherren Mocha, MD      . ALPRAZolam Duanne Moron) tablet 0.5 mg  0.5 mg Oral TID PRN Sherren Mocha, MD      . ALPRAZolam Duanne Moron) tablet 1 mg  1 mg Oral QHS PRN Theone Murdoch Hammons, RPH   1 mg at 10/22/16 0119  . amiodarone (NEXTERONE PREMIX) 360-4.14 MG/200ML-% (1.8 mg/mL) IV infusion  30 mg/hr Intravenous Continuous Sherren Mocha, MD 16.7 mL/hr at 10/22/16 0838 30 mg/hr at 10/22/16 0838  . aspirin chewable tablet 81 mg  81 mg Oral Daily Sherren Mocha, MD      . bisacodyl (DULCOLAX) suppository 10 mg  10 mg Rectal Daily PRN Sherren Mocha, MD      . heparin ADULT infusion 100 units/mL (25000 units/274mL sodium chloride 0.45%)  900 Units/hr Intravenous Continuous Donalynn Furlong Ephraim, RPH 9 mL/hr at 10/22/16 0051 900 Units/hr at 10/22/16 0051   . HYDROcodone-acetaminophen (NORCO) 7.5-325 MG per tablet 1-2 tablet  1-2 tablet Oral Q6H PRN Sherren Mocha, MD   2 tablet at 10/22/16 [phone]  .  meclizine (ANTIVERT) tablet 25 mg  25 mg Oral QID PRN Sherren Mocha, MD      . MEDLINE mouth rinse  15 mL Mouth Rinse BID Sherren Mocha, MD      . megestrol (MEGACE) 400 MG/10ML suspension 400 mg  400 mg Oral Daily Sherren Mocha, MD      . metoprolol tartrate (LOPRESSOR) tablet 25 mg  25 mg Oral BID Sherren Mocha, MD   25 mg at 10/21/16 2126  . morphine 2 MG/ML injection 2-4 mg  2-4 mg Intravenous Q4H PRN Sherren Mocha, MD      . multivitamin (RENA-VIT) tablet 1 tablet  1 tablet Oral QHS Sherren Mocha, MD   1 tablet at 10/21/16 2126  . nitroGLYCERIN (NITROSTAT) SL tablet 0.4 mg  0.4 mg Sublingual Q5 Min x 3 PRN Sherren Mocha, MD      . ondansetron [facility]) injection 4 mg  4 mg Intravenous Q6H PRN Sherren Mocha, MD      . pantoprazole (PROTONIX) EC tablet 40 mg  40 mg Oral Daily Sherren Mocha, MD      . polyethylene glycol [facility] / GLYCOLAX) packet 17 g  17 g Oral Daily Sherren Mocha, MD      . sevelamer carbonate (RENVELA) tablet 2,400 mg  2,400 mg Oral BID PRN Theone Murdoch Hammons, RPH      . sevelamer carbonate (RENVELA) tablet 3,200 mg  3,200 mg Oral TID WC Sherren Mocha, MD   3,200 mg at 10/22/16 0370  . sodium chloride 0.9 % bolus 1,000 mL  1,000 mL Intravenous Once Karma Greaser, MD      . sodium chloride flush (NS) 0.9 % injection 3 mL  3 mL Intravenous Q12H Sherren Mocha, MD   3 mL at 10/21/16 2127  . sodium chloride flush (NS) 0.9 % injection 3 mL  3 mL Intravenous PRN Sherren Mocha, MD      . zolpidem [facility]) tablet 5 mg  5 mg Oral QHS PRN Sherren Mocha, MD   5 mg at 10/21/16 2134   Labs: Basic Metabolic Panel:  Recent Labs Lab 10/21/16 1526  NA 137  K 4.0  CL 100*  GLUCOSE 116*  BUN 44*  CREATININE 3.60*   Liver Function Tests: No results for input(s): AST, ALT, ALKPHOS, BILITOT, PROT, ALBUMIN in the last 168  hours. No results for input(s): LIPASE, AMYLASE in the last 168 hours. No results for input(s): AMMONIA in the last 168 hours. CBC:  Recent Labs Lab 10/21/16 1520 10/21/16 1526  WBC 3.9*  --   NEUTROABS 2.9  --   HGB 9.5* 10.9*  HCT 32.0* 32.0*  MCV 90.9  --   PLT 100*  --    Cardiac Enzymes:  Recent Labs Lab 10/21/16 2000 10/22/16 0200  TROPONINI 1.09* 3.20*   CBG: No results for input(s): GLUCAP in the last 168 hours. Iron Studies: No results for input(s): IRON, TIBC, TRANSFERRIN, FERRITIN in the last 72 hours. Studies/Results: No results found.  ROS: As per HPI otherwise negative.   Physical Exam: Vitals:   10/22/16 0500 10/22/16 0600 10/22/16 0700 10/22/16 0800  BP: 127/63 135/68  128/72  Pulse: (!) 57 (!) 57  (!) 58  Resp: 17 17  17   Temp:   98.2 F (36.8 C)   TempSrc:   Oral   SpO2: 98% 100%  100%  Weight:      Height:         General: Thin, chronically ill appearing  in no acute distress.  Head: Normocephalic, atraumatic, sclera non-icteric, mucus membranes are moist Neck: Supple. JVD not elevated. Lungs: Clear bilaterally to auscultation without wheezes, rales, or rhonchi. Breathing is unlabored. Heart: RRR with S1 S2. With 3/6 harsh systolic M radiating across precordium. SR at present on monitor rate 60s with frequent PACS.  Abdomen: Soft, non-tender, non-distended with normoactive bowel sounds. No rebound/guarding. No obvious abdominal masses. M-S:  Strength and tone appear normal for age. Lower extremities:without edema or ischemic changes, no open wounds  Neuro: Alert and oriented X 3. Moves all extremities spontaneously. Psych:  Responds to questions appropriately with a normal affect. Dialysis Access: LUA AVF + bruit  Dialysis Orders: [facility] MWF 3 hours 180 NRe 400/A800 60.5 kg 2.0 K/2.25 Ca Heparin 2000 units IV TIW Mircera 150 mcg IV q 2 weeks (Has not rec'd dose yet-ESA has just been resumed-last HGB 10.7 10/15/2016) Calcitriol 0.25  mcg PO TIW    Assessment/Plan: 1. Acute Inferior MI: Probably due to embolic event. Cath shows nonobstructive CAD. No intervention required. 2. Severe AS: Not sure if she is a candidate for intervention. Cardiology following 3. PAF: Currently in SR rate controlled on amiodarone drip-transitioning to PO amiodarone. Currently on heparin drip transitioning back to Eliquis per pharmacy.  4. ESRD - MWF HD today on schedule. Stable to travel to HD unit per Dr. Burt Knack.  5. Hypertension/volume  - BP controlled. Continued metoprolol. No evidence of volume overload on exam. Wt today 63.8. Will attempt to reach OP EDW 60.5 kg in HD today.  6. Anemia  - HGB 10.9 on adm (ISTAT) now 8.9 today. Patient had been ordered ESA as OP but has not rec'd dose yet. Will give today in HD.  7. Metabolic bone disease - Check labs in HD today. Cont binders/VDRA 8. Nutrition - Currently on [place]. K+ not an issue leave for now-add fluid restrictions.  9. DM: No OP meds 10. Hypothyroidism: Cont synthroid per primary 11. Severe COPD: per primary. Needs to stop smoking.  Disposition: Probably DC home tomorrow.   Rita H. Owens Shark, NP-C 10/22/2016, 9:31 AM  [employer] [phone]  Pt seen, examined and agree w A/P as above.  Kelly Splinter MD [employer] pager [phone]   10/22/2016, 3:34 PM

## 2016-10-22 NOTE — Procedures (Signed)
  I was present at this dialysis session, have reviewed the session itself and made  appropriate changes Kelly Splinter MD [place] pager [phone]   10/22/2016, 3:35 PM

## 2016-10-23 ENCOUNTER — Inpatient Hospital Stay (HOSPITAL_COMMUNITY): Payer: Medicare Other

## 2016-10-23 ENCOUNTER — Encounter (HOSPITAL_COMMUNITY): Payer: Self-pay

## 2016-10-23 DIAGNOSIS — I350 Nonrheumatic aortic (valve) stenosis: Secondary | ICD-10-CM

## 2016-10-23 LAB — ECHOCARDIOGRAM COMPLETE
HEIGHTINCHES: 65 in
Weight: 2179.91 oz

## 2016-10-23 LAB — HEMOGLOBIN A1C
HEMOGLOBIN A1C: 4.9 % (ref 4.8–5.6)
MEAN PLASMA GLUCOSE: 94 mg/dL

## 2016-10-23 MED ORDER — METOPROLOL TARTRATE 12.5 MG HALF TABLET
12.5000 mg | ORAL_TABLET | Freq: Two times a day (BID) | ORAL | Status: DC
  Administered 2016-10-25: 12.5 mg via ORAL
  Filled 2016-10-23 (×5): qty 1

## 2016-10-23 MED ORDER — APIXABAN 5 MG PO TABS
5.0000 mg | ORAL_TABLET | Freq: Two times a day (BID) | ORAL | Status: DC
  Administered 2016-10-23 – 2016-10-25 (×5): 5 mg via ORAL
  Filled 2016-10-23 (×5): qty 1

## 2016-10-23 MED ORDER — CHLORHEXIDINE GLUCONATE CLOTH 2 % EX PADS
6.0000 | MEDICATED_PAD | Freq: Every day | CUTANEOUS | Status: DC
  Administered 2016-10-24: 6 via TOPICAL

## 2016-10-23 MED ORDER — HYDROCODONE-ACETAMINOPHEN 7.5-325 MG PO TABS
2.0000 | ORAL_TABLET | Freq: Three times a day (TID) | ORAL | Status: DC
  Administered 2016-10-23 – 2016-10-25 (×7): 2 via ORAL
  Filled 2016-10-23 (×7): qty 2

## 2016-10-23 NOTE — Progress Notes (Signed)
  [facility] Progress Note   Subjective: no new c/o  Vitals:   10/23/16 0806 10/23/16 0815 10/23/16 1000 10/23/16 1200  BP:   126/61 (!) 132/57  Pulse:  (!) 51 (!) 53 (!) 58  Resp:  16 11 14   Temp: 98.4 F (36.9 C)   97.9 F (36.6 C)  TempSrc: Oral   Axillary  SpO2:  100% 100% 95%  Weight:      Height:        Inpatient medications: . amiodarone  400 mg Oral BID  . apixaban  5 mg Oral BID  . aspirin  81 mg Oral Daily  . calcitRIOL  0.25 mcg Oral Q M,W,F-HD  . Chlorhexidine Gluconate Cloth  6 each Topical Daily  . darbepoetin (ARANESP) injection - DIALYSIS  100 mcg Intravenous Q Wed-HD  . HYDROcodone-acetaminophen  2 tablet Oral Q8H  . levothyroxine  175 mcg Oral QAC breakfast  . mouth rinse  15 mL Mouth Rinse BID  . metoprolol tartrate  12.5 mg Oral BID  . multivitamin  1 tablet Oral QHS  . pantoprazole  40 mg Oral Daily  . polyethylene glycol  17 g Oral Daily  . rosuvastatin  20 mg Oral q1800  . sevelamer carbonate  3,200 mg Oral TID WC  . sodium chloride  1,000 mL Intravenous Once  . sodium chloride flush  3 mL Intravenous Q12H    sodium chloride, acetaminophen, ALPRAZolam, ALPRAZolam, bisacodyl, meclizine, morphine injection, nitroGLYCERIN, ondansetron (ZOFRAN) IV, sevelamer carbonate, sodium chloride flush, zolpidem  Exam: Frail, thin adult female, not in distress No jvd Chest clear bilat RRR 3/6 harsh SEM Abd soft ntnd Ext no edema LUA AVF +bruit NF Ox 3  Dialysis: [facility] MWF 3h  60.5kg  2/2.25 bath   Hep 2000   LUA AVF  - Mircera 150 every 2 wks, not dosed yet  - Calcitriol 0.25 ug tiw      Assessment: 1. Acute inf MI - felt to be cardioembolic in setting of afib. No lesions to stent, nonobstruct CAD by cath.  2. Severe AS - cardiology not sure if she is candidate for intervention 3. ESRD HD MWF 4. Volume - stable, +1kg. No BP meds.  5. MBD cont binders/ D 6. Anemia of CKD - Hb 8.9, start ESA tomorrow 7. COPD - severe, still  smoking  Plan - HD Friday, UF to dry wt, darbe w HD tomorrow   Kelly Splinter MD [facility] pager [phone]   10/23/2016, 1:42 PM    Recent Labs Lab 10/21/16 1526 10/22/16 1345  NA 137 138  K 4.0 4.2  CL 100* 97*  CO2  --  32  GLUCOSE 116* 98  BUN 44* 45*  CREATININE 3.60* 4.48*  CALCIUM  --  8.3*  PHOS  --  6.1*    Recent Labs Lab 10/22/16 1345  ALBUMIN 2.9*    Recent Labs Lab 10/21/16 1520 10/21/16 1526 10/22/16 0837  WBC 3.9*  --  3.0*  NEUTROABS 2.9  --   --   HGB 9.5* 10.9* 8.9*  HCT 32.0* 32.0* 29.8*  MCV 90.9  --  91.1  PLT 100*  --  97*   Iron/TIBC/Ferritin/ %Sat    Component Value Date/Time   IRON 41 (L) 07/15/2013 0800   TIBC 89 (L) 07/15/2013 0800   FERRITIN 148 07/15/2013 0800   IRONPCTSAT 46 07/15/2013 0800

## 2016-10-23 NOTE — Progress Notes (Signed)
Progress Note  Patient Name: Anita [name] Date of Encounter: 10/23/2016  Primary Cardiologist: New, Dr Burt Knack  Subjective   C/o worsening of her chronic pain - neck/back. No further chest pain. No shortness of breath. Doesn't feel right. Palpitations yesterday, none today. Takes hydrocodone (?7.5 mg) at home. Takes Lopressor 12.5 mg bid at home. Does not feel ready for dc.   Inpatient Medications    Scheduled Meds: . amiodarone  400 mg Oral BID  . apixaban  2.5 mg Oral BID  . aspirin  81 mg Oral Daily  . calcitRIOL  0.25 mcg Oral Q M,W,F-HD  . Chlorhexidine Gluconate Cloth  6 each Topical Daily  . darbepoetin (ARANESP) injection - DIALYSIS  100 mcg Intravenous Q Wed-HD  . levothyroxine  175 mcg Oral QAC breakfast  . mouth rinse  15 mL Mouth Rinse BID  . megestrol  400 mg Oral Daily  . metoprolol tartrate  25 mg Oral BID  . multivitamin  1 tablet Oral QHS  . pantoprazole  40 mg Oral Daily  . polyethylene glycol  17 g Oral Daily  . rosuvastatin  20 mg Oral q1800  . sevelamer carbonate  3,200 mg Oral TID WC  . sodium chloride  1,000 mL Intravenous Once  . sodium chloride flush  3 mL Intravenous Q12H   Continuous Infusions:  PRN Meds: sodium chloride, acetaminophen, ALPRAZolam, ALPRAZolam, bisacodyl, HYDROcodone-acetaminophen, meclizine, morphine injection, nitroGLYCERIN, ondansetron (ZOFRAN) IV, sevelamer carbonate, sodium chloride flush, zolpidem   Vital Signs    Vitals:   10/23/16 0300 10/23/16 0400 10/23/16 0600 10/23/16 0806  BP:  (!) 115/52 (!) 116/49   Pulse: 62 (!) 51 (!) 51   Resp: 16 13 17    Temp:  97.7 F (36.5 C)  98.4 F (36.9 C)  TempSrc:  Oral  Oral  SpO2: 100% 100% 100%   Weight:      Height:        Intake/Output Summary (Last 24 hours) at 10/23/16 0823 Last data filed at 10/22/16 2200  Gross per 24 hour  Intake               60 ml  Output             2500 ml  Net            -2440 ml   Filed Weights   10/21/16 1722 10/22/16 1330 10/22/16  1647  Weight: 140 lb 10.5 oz (63.8 kg) 141 lb 12.1 oz (64.3 kg) 136 lb 3.9 oz (61.8 kg)    Telemetry    Sinus rhythm - Personally Reviewed  ECG    Wandering baseline limits interpretation, NSR, resolution of inferior ST elevation - Personally Reviewed  Physical Exam  Elderly appearing woman GEN: No acute distress.   Neck: No JVD - ecchymoses left neck Cardiac: RRR, 3/6 harsh late peaking systolic murmur LSB  Respiratory: Coarse BS bilaterally. GI: Soft, nontender, non-distended  MS: No edema; No deformity. Right groin site clear Neuro:  Nonfocal  Psych: Normal affect   Labs    Chemistry  Recent Labs Lab 10/21/16 1526 10/22/16 1345  NA 137 138  K 4.0 4.2  CL 100* 97*  CO2  --  32  GLUCOSE 116* 98  BUN 44* 45*  CREATININE 3.60* 4.48*  CALCIUM  --  8.3*  ALBUMIN  --  2.9*  GFRNONAA  --  9*  GFRAA  --  11*  ANIONGAP  --  9     Hematology  Recent Labs Lab Nov 01, 2016 1520 11-01-16 1526 10/22/16 0837  WBC 3.9*  --  3.0*  RBC 3.52*  --  3.27*  HGB 9.5* 10.9* 8.9*  HCT 32.0* 32.0* 29.8*  MCV 90.9  --  91.1  MCH 27.0  --  27.2  MCHC 29.7*  --  29.9*  RDW 14.3  --  14.4  PLT 100*  --  97*    Cardiac Enzymes  Recent Labs Lab 01-Nov-2016 2000 10/22/16 0200 10/22/16 0837  TROPONINI 1.09* 3.20* 2.41*     Recent Labs Lab 11/01/2016 1525  TROPIPOC 0.08     Radiology    No results found.  Cardiac Studies   2D Echo pending  CATH: 02-Nov-2022 1. Acute inferior MI, probable embolic event involving the RCA but no lesion identified. Collateral supply by the left coronary artery. 2. 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 Successful placement of a left IJ triple lumen catheter using ultrasound guidance  The patient is chronically ill. I don't think there are any percutaneous treatment options for  her coronary artery disease. She had an embolic event related atrial fibrillation. She also has severe calcific aortic stenosis and a porcelain aorta. Will check an echocardiogram. Would consider using warfarin for anticoagulation since she is on hemodialysis. This may be a better alternative than Eliquis. Will also need to address goals of care with her to make decisions about how aggressive she wants to be with her medical care. She has multiple severe comorbid conditions as outlined.  Patient Profile     66 y.o. female with multiple severe medical problems who presents with an acute inferior MI secondary to coronary embolism  Assessment & Plan    1. Paroxysmal atrial fibrillation: pt in AF with RVR at presentation, now converted to sinus rhtyhm on IV amiodarone. Was treated with Eliquis - while limited data on Eliquis in ESRD  Population, she had severe bleeding on warfarin an absolutely will not take this again. Will resume eliquis 2.5 mg BID. Converted amiodarone to oral 400 mg BID 03/07.  2. Acute inferior MI: secondary to embolism. Treat with ASA, beta-blocker. No ACE with ESRD. Statin-intolerant. With sinus brady, reduce Lopressor to home dose.  3. Aortic stenosis, suspect severe by exam. Echo today. Not sure she will be a candidate for treatment.   4. Acute on chronic pain syndrome. Refractory to hydrocodone, will confirm her home dose. Will give morphine as needed. Will schedule doses TID, she is only getting it once a day.  5. ESRD: appreciate renal team seeing her  6. Anticoagulation: CHADS2VASc= 6 (age x 1, female, CAD, CHF, HTN, DM). On Eliquis, but per pharmacist, should be 5 mg bid in HD pts.   7. Undernourished: pt on Megace, not taking consistently PTA. Side effect (per pharmacist) is thrombus, could Remeron instead. Discuss with MD.  Jonetta Speak, PA-C  10/23/2016, 8:23 AM    Patient seen, examined. Available data reviewed. Agree with findings, assessment, and  plan as outlined by Rosaria Ferries, PA-C. Alert, oriented chronically ill woman in no distress, lungs with diminished breath sounds throughout, heart regular rate and rhythm with grade 3/6 harsh late peaking systolic murmur, absent A2, abdomen soft and nontender, extremities without edema. The patient's echocardiogram is reviewed. She has severe aortic stenosis with a mean gradient of 42 mmHg. Considering her severe comorbid conditions, we'll have to discuss whether  she is even interested in considering treatment. Her only treatment option would be TAVR, and even that would carry significant risk for her considering all problems as outlined above. Will change her approximate dose to 5 mg twice daily. There is limited data in the end-stage renal disease population, but she did present with an embolic event and is unable to take warfarin (see previous discussion). Will transfer out of ICU today. Will increase hydrocodone back to her home dose to better deal with her chronic pain.  Sherren Mocha, M.D. 10/23/2016 12:33 PM

## 2016-10-23 NOTE — Care Management Note (Addendum)
Case Management Note  Patient Details  Name: Anita [name] MRN: [id number] Date of Birth: [birth year]  Subjective/Objective:  Pt lives with husband, states he is her caregiver, has home O2 through [facility], walker, wheelchair, BSC and shower chair.  Dr Karie Kirks is PCP and she has h/o home care services through [facility].  C/O extreme weakness but states she would not consider going to a SNF for rehab - wants to return home with husband.        Addendum 3/10 Carles Collet RN CM Patient with order to DC home. [facility] orders for OT PT RN. Referral made to [facility], clinical liaison [facility]. Spoke with patient and she states her husband will provide transportation home and oxygen tank from home for transport. No further CM needs/ orders at this time.                            Expected Discharge Plan:  [place]  Discharge planning Services  CM Consult  Status of Service:  In process, will continue to follow  Girard Cooter, RN 10/23/2016, 1:40 PM

## 2016-10-23 NOTE — Progress Notes (Signed)
  Echocardiogram 2D Echocardiogram has been performed.  Darlina Sicilian M 10/23/2016, 9:46 AM

## 2016-10-24 ENCOUNTER — Encounter (HOSPITAL_COMMUNITY): Payer: Self-pay

## 2016-10-24 DIAGNOSIS — I350 Nonrheumatic aortic (valve) stenosis: Secondary | ICD-10-CM

## 2016-10-24 LAB — BASIC METABOLIC PANEL
Anion gap: 8 (ref 5–15)
BUN: 27 mg/dL — AB (ref 6–20)
CO2: 30 mmol/L (ref 22–32)
Calcium: 8.5 mg/dL — ABNORMAL LOW (ref 8.9–10.3)
Chloride: 95 mmol/L — ABNORMAL LOW (ref 101–111)
Creatinine, Ser: 4.38 mg/dL — ABNORMAL HIGH (ref 0.44–1.00)
GFR calc Af Amer: 11 mL/min — ABNORMAL LOW (ref >60)
GFR calc non Af Amer: 10 mL/min — ABNORMAL LOW (ref >60)
GLUCOSE: 92 mg/dL (ref 65–99)
POTASSIUM: 3.8 mmol/L (ref 3.5–5.1)
Sodium: 133 mmol/L — ABNORMAL LOW (ref 135–145)

## 2016-10-24 LAB — CBC
HEMATOCRIT: 29 % — AB (ref 36.0–46.0)
Hemoglobin: 8.4 g/dL — ABNORMAL LOW (ref 12.0–15.0)
MCH: 27 pg (ref 26.0–34.0)
MCHC: 29 g/dL — AB (ref 30.0–36.0)
MCV: 93.2 fL (ref 78.0–100.0)
Platelets: 76 10*3/uL — ABNORMAL LOW (ref 150–400)
RBC: 3.11 MIL/uL — ABNORMAL LOW (ref 3.87–5.11)
RDW: 14.1 % (ref 11.5–15.5)
WBC: 2.5 10*3/uL — ABNORMAL LOW (ref 4.0–10.5)

## 2016-10-24 MED ORDER — PENTAFLUOROPROP-TETRAFLUOROETH EX AERO: 1.0000 "application " | INHALATION_SPRAY | CUTANEOUS | Status: DC | PRN

## 2016-10-24 MED ORDER — MORPHINE SULFATE (PF) 4 MG/ML IV SOLN
INTRAVENOUS | Status: AC
  Administered 2016-10-24: 4 mg
  Filled 2016-10-24: qty 1

## 2016-10-24 MED ORDER — HEPARIN SODIUM (PORCINE) 1000 UNIT/ML DIALYSIS: 2000.0000 [IU] | Freq: Once | INTRAMUSCULAR | Status: DC

## 2016-10-24 MED ORDER — HEPARIN SODIUM (PORCINE) 1000 UNIT/ML DIALYSIS: 1000.0000 [IU] | INTRAMUSCULAR | Status: DC | PRN

## 2016-10-24 MED ORDER — LIDOCAINE-PRILOCAINE 2.5-2.5 % EX CREA: 1.0000 "application " | TOPICAL_CREAM | CUTANEOUS | Status: DC | PRN

## 2016-10-24 MED ORDER — CALCITRIOL 0.25 MCG PO CAPS
ORAL_CAPSULE | ORAL | Status: AC
  Administered 2016-10-24: 10:00:00
  Filled 2016-10-24: qty 1

## 2016-10-24 MED ORDER — LIDOCAINE HCL (PF) 1 % IJ SOLN: 5.0000 mL | INTRAMUSCULAR | Status: DC | PRN

## 2016-10-24 MED ORDER — SODIUM CHLORIDE 0.9 % IV SOLN: 100.0000 mL | INTRAVENOUS | Status: DC | PRN

## 2016-10-24 MED ORDER — ALTEPLASE 2 MG IJ SOLR: 2.0000 mg | Freq: Once | INTRAMUSCULAR | Status: DC | PRN

## 2016-10-24 NOTE — Progress Notes (Signed)
Report called to RN.

## 2016-10-24 NOTE — Progress Notes (Signed)
  [facility] Progress Note   Subjective: no c/o's, on HD  Vitals:   10/24/16 1000 10/24/16 1017 10/24/16 1117 10/24/16 1156  BP: (!) 117/50 (!) 147/61 (!) 141/47 (!) 139/51  Pulse: (!) 58 (!) 57 (!) 57 (!) 57  Resp: 19 15  18   Temp:  97.8 F (36.6 C)  98.2 F (36.8 C)  TempSrc:  Oral Oral Oral  SpO2: 100% 100%  100%  Weight:  60.5 kg (133 lb 6.1 oz)    Height:        Inpatient medications: . amiodarone  400 mg Oral BID  . apixaban  5 mg Oral BID  . aspirin  81 mg Oral Daily  . calcitRIOL  0.25 mcg Oral Q M,W,F-HD  . Chlorhexidine Gluconate Cloth  6 each Topical Daily  . darbepoetin (ARANESP) injection - DIALYSIS  100 mcg Intravenous Q Wed-HD  . HYDROcodone-acetaminophen  2 tablet Oral Q8H  . levothyroxine  175 mcg Oral QAC breakfast  . mouth rinse  15 mL Mouth Rinse BID  . metoprolol tartrate  12.5 mg Oral BID  . multivitamin  1 tablet Oral QHS  . pantoprazole  40 mg Oral Daily  . polyethylene glycol  17 g Oral Daily  . rosuvastatin  20 mg Oral q1800  . sevelamer carbonate  3,200 mg Oral TID WC  . sodium chloride  1,000 mL Intravenous Once  . sodium chloride flush  3 mL Intravenous Q12H    sodium chloride, acetaminophen, ALPRAZolam, ALPRAZolam, bisacodyl, meclizine, morphine injection, nitroGLYCERIN, ondansetron (ZOFRAN) IV, sevelamer carbonate, sodium chloride flush, zolpidem  Exam: Thin and frail, no HD No jvd Chest w/o rales/ wheezing RRR loud 3/6 sem Abd soft, scaphoid, +bs Ext no edema LUA AVF+bruit  Dialysis: [facility] MWF 3h   60.5kg   400/800  2/2.25 bath   Hep 2000   LUA AVF  - Mircera 150 every 2 wks, hasn't rec'd dose yet  - Calcitriol 0.25 ug tiw po      Assessment: 1. Acute inferior MI - felt to be cardioembolic, no intervention needed. SP Cath.   2. Severe AS - per cardiology 3. ESRD HD today 4. Volume - is at dry wt 5. Anemia of CKD - no chgs 6. 2HPTH - no chgs 7. DM no meds 8. COPD/ smoker  Plan - HD today, min  UF   Kelly Splinter MD [facility] pager [phone]   10/24/2016, 12:24 PM    Recent Labs Lab 10/21/16 1526 10/22/16 1345 10/24/16 0415  NA 137 138 133*  K 4.0 4.2 3.8  CL 100* 97* 95*  CO2  --  32 30  GLUCOSE 116* 98 92  BUN 44* 45* 27*  CREATININE 3.60* 4.48* 4.38*  CALCIUM  --  8.3* 8.5*  PHOS  --  6.1*  --     Recent Labs Lab 10/22/16 1345  ALBUMIN 2.9*    Recent Labs Lab 10/21/16 1520 10/21/16 1526 10/22/16 0837 10/24/16 0415  WBC 3.9*  --  3.0* 2.5*  NEUTROABS 2.9  --   --   --   HGB 9.5* 10.9* 8.9* 8.4*  HCT 32.0* 32.0* 29.8* 29.0*  MCV 90.9  --  91.1 93.2  PLT 100*  --  97* 76*   Iron/TIBC/Ferritin/ %Sat    Component Value Date/Time   IRON 41 (L) 07/15/2013 0800   TIBC 89 (L) 07/15/2013 0800   FERRITIN 148 07/15/2013 0800   IRONPCTSAT 46 07/15/2013 0800

## 2016-10-24 NOTE — Progress Notes (Signed)
Progress Note  Patient Name: MINDI AKERSON Date of Encounter: 10/24/2016  Primary Cardiologist: new Burt Knack)  Subjective   Feels weak. No specific c/o. No CP or dyspnea.   Inpatient Medications    Scheduled Meds: . amiodarone  400 mg Oral BID  . apixaban  5 mg Oral BID  . aspirin  81 mg Oral Daily  . calcitRIOL  0.25 mcg Oral Q M,W,F-HD  . Chlorhexidine Gluconate Cloth  6 each Topical Daily  . darbepoetin (ARANESP) injection - DIALYSIS  100 mcg Intravenous Q Wed-HD  . HYDROcodone-acetaminophen  2 tablet Oral Q8H  . levothyroxine  175 mcg Oral QAC breakfast  . mouth rinse  15 mL Mouth Rinse BID  . metoprolol tartrate  12.5 mg Oral BID  . multivitamin  1 tablet Oral QHS  . pantoprazole  40 mg Oral Daily  . polyethylene glycol  17 g Oral Daily  . rosuvastatin  20 mg Oral q1800  . sevelamer carbonate  3,200 mg Oral TID WC  . sodium chloride  1,000 mL Intravenous Once  . sodium chloride flush  3 mL Intravenous Q12H   Continuous Infusions:  PRN Meds: sodium chloride, acetaminophen, ALPRAZolam, ALPRAZolam, bisacodyl, meclizine, morphine injection, nitroGLYCERIN, ondansetron (ZOFRAN) IV, sevelamer carbonate, sodium chloride flush, zolpidem   Vital Signs    Vitals:   10/24/16 1156 10/24/16 1200 10/24/16 1300 10/24/16 1400  BP: (!) 139/51 (!) 133/38    Pulse: (!) 57 (!) 56 (!) 59 (!) 58  Resp: 18 19 15 14   Temp: 98.2 F (36.8 C)     TempSrc: Oral     SpO2: 100% 100% 95% 99%  Weight:      Height:        Intake/Output Summary (Last 24 hours) at 10/24/16 1444 Last data filed at 10/24/16 1017  Gross per 24 hour  Intake              360 ml  Output             2700 ml  Net            -2340 ml   Filed Weights   10/22/16 1647 10/24/16 0700 10/24/16 1017  Weight: 136 lb 3.9 oz (61.8 kg) 139 lb 5.3 oz (63.2 kg) 133 lb 6.1 oz (60.5 kg)    Telemetry    Sinus Loletha Grayer - Personally Reviewed   Physical Exam  Elderly frail appearing woman in NAD GEN: No acute distress.     Neck: No JVD Cardiac: brady and regular with 4/6 harsh late-peaking systolic murmur, absent A2 Respiratory: Clear to auscultation bilaterally. GI: Soft, nontender, non-distended  MS: No edema; No deformity. Neuro:  Nonfocal  Psych: Normal affect   Labs    Chemistry Recent Labs Lab 10/21/16 1526 10/22/16 1345 10/24/16 0415  NA 137 138 133*  K 4.0 4.2 3.8  CL 100* 97* 95*  CO2  --  32 30  GLUCOSE 116* 98 92  BUN 44* 45* 27*  CREATININE 3.60* 4.48* 4.38*  CALCIUM  --  8.3* 8.5*  ALBUMIN  --  2.9*  --   GFRNONAA  --  9* 10*  GFRAA  --  11* 11*  ANIONGAP  --  9 8     Hematology Recent Labs Lab 10/21/16 1520 10/21/16 1526 10/22/16 0837 10/24/16 0415  WBC 3.9*  --  3.0* 2.5*  RBC 3.52*  --  3.27* 3.11*  HGB 9.5* 10.9* 8.9* 8.4*  HCT 32.0* 32.0* 29.8* 29.0*  MCV  90.9  --  91.1 93.2  MCH 27.0  --  27.2 27.0  MCHC 29.7*  --  29.9* 29.0*  RDW 14.3  --  14.4 14.1  PLT 100*  --  97* 76*    Cardiac Enzymes Recent Labs Lab 10/21/16 2000 10/22/16 0200 10/22/16 0837  TROPONINI 1.09* 3.20* 2.41*    Recent Labs Lab 10/21/16 1525  TROPIPOC 0.08     BNPNo results for input(s): BNP, PROBNP in the last 168 hours.   DDimer No results for input(s): DDIMER in the last 168 hours.   Radiology    No results found.  Cardiac Studies   2D Echo: Study Conclusions  - Left ventricle: The cavity size was normal. Wall thickness was   increased in a pattern of moderate LVH. Systolic function was   normal. The estimated ejection fraction was in the range of 55%   to 60%. Wall motion was normal; there were no regional wall   motion abnormalities. Features are consistent with a pseudonormal   left ventricular filling pattern, with concomitant abnormal   relaxation and increased filling pressure (grade 2 diastolic   dysfunction). Doppler parameters are consistent with high   ventricular filling pressure. - Aortic valve: Valve mobility was restricted. There was severe    stenosis. There was mild to moderate regurgitation. Valve area   (VTI): 0.65 cm^2. Valve area (Vmax): 0.6 cm^2. Valve area   (Vmean): 0.58 cm^2. - Mitral valve: Calcified annulus. There was mild regurgitation. - Left atrium: The atrium was mildly dilated. - Right ventricle: Systolic function was mildly reduced. - Right atrium: The atrium was mildly dilated. - Atrial septum: The septum bowed from right to left, consistent   with increased right atrial pressure. - Tricuspid valve: There was moderate regurgitation. - Pulmonary arteries: Systolic pressure was moderately increased.   PA peak pressure: 54 mm Hg (S).  Impressions:  - Normal LV systolic function; moderate LVH; grade 2 diastolic   dysfunction with elevated LV filling pressure; mild biatrial   enlargement; mildly reduced RV function; calcified aortic valve   with severe AS (mean gradient 42 mmHg) and mild to moderate AI;   mild MR; moderate TR with moderately elevated pulmonary pressure.  Patient Profile     66 y.o. female with multiple severe medical problems who presents with an acute inferior MI secondary to coronary embolism  Assessment & Plan    1. Paroxysmal atrial fibrillation: pt in AF with RVR at presentation, now converted to sinus rhtyhm on IV amiodarone. Unable to take warfarin. Continue Eliquis 5 mg twice daily. Notes she was taking 2.5 mg twice daily prior to admission when she presented with an embolic event. Continue amiodarone 400 mg twice daily 2 weeks, then reduce dose to 200 mg daily.  2. Acute inferior MI: secondary to embolism. Treat with ASA, beta-blocker. No ACE with ESRD. Statin-intolerant. With sinus brady, reduce Lopressor to home dose.  3. Aortic stenosis, severe: I had a lengthy discussion with the patient regarding her prognosis with severe aortic stenosis in the context of multiple other comorbid conditions. She is not interested in pursuing treatment of this. She would like to focus on comfort  and quality of life at this point.  4. Acute on chronic pain syndrome. Has increased her narcotic regimen back to her home dosing.  5. ESRD: appreciate renal team seeing her. Continue intermittent hemodialysis  6. Severe protein calorie malnutrition: pt on Megace, not taking consistently PTA. This medicine is discontinued because of potential for  thrombotic complications.  7. Disposition: The patient is waiting for transfer to a telemetry bed. We talked this afternoon about her wishes with respect to goals of care. She seems ready for a palliative approach to her care. She would like to continue with medical treatment of her cardiac problems. She would like to continue with dialysis as long as she tolerates this. She is not interested in further invasive procedures and she clearly states again that she wishes to be a DO NOT RESUSCITATE if any catastrophic event were to occur. I am going to ask the palliative medicine team to see her to investigate what resources might be available for her at discharge.  Deatra James, MD  10/24/2016, 2:44 PM

## 2016-10-25 DIAGNOSIS — Z992 Dependence on renal dialysis: Secondary | ICD-10-CM

## 2016-10-25 DIAGNOSIS — Z7189 Other specified counseling: Secondary | ICD-10-CM

## 2016-10-25 DIAGNOSIS — I480 Paroxysmal atrial fibrillation: Secondary | ICD-10-CM

## 2016-10-25 DIAGNOSIS — Z515 Encounter for palliative care: Secondary | ICD-10-CM

## 2016-10-25 DIAGNOSIS — N186 End stage renal disease: Secondary | ICD-10-CM

## 2016-10-25 DIAGNOSIS — R64 Cachexia: Secondary | ICD-10-CM

## 2016-10-25 LAB — CBC
HCT: 29.1 % — ABNORMAL LOW (ref 36.0–46.0)
Hemoglobin: 8.5 g/dL — ABNORMAL LOW (ref 12.0–15.0)
MCH: 27.2 pg (ref 26.0–34.0)
MCHC: 29.2 g/dL — ABNORMAL LOW (ref 30.0–36.0)
MCV: 93 fL (ref 78.0–100.0)
PLATELETS: 82 10*3/uL — AB (ref 150–400)
RBC: 3.13 MIL/uL — AB (ref 3.87–5.11)
RDW: 13.9 % (ref 11.5–15.5)
WBC: 2.4 10*3/uL — AB (ref 4.0–10.5)

## 2016-10-25 LAB — BASIC METABOLIC PANEL
Anion gap: 7 (ref 5–15)
BUN: 15 mg/dL (ref 6–20)
CO2: 29 mmol/L (ref 22–32)
CREATININE: 3.3 mg/dL — AB (ref 0.44–1.00)
Calcium: 8.6 mg/dL — ABNORMAL LOW (ref 8.9–10.3)
Chloride: 97 mmol/L — ABNORMAL LOW (ref 101–111)
GFR calc non Af Amer: 13 mL/min — ABNORMAL LOW (ref >60)
GFR, EST AFRICAN AMERICAN: 16 mL/min — AB (ref >60)
Glucose, Bld: 84 mg/dL (ref 65–99)
Potassium: 4 mmol/L (ref 3.5–5.1)
SODIUM: 133 mmol/L — AB (ref 135–145)

## 2016-10-25 MED ORDER — APIXABAN 5 MG PO TABS: 5.0000 mg | ORAL_TABLET | Freq: Two times a day (BID) | ORAL | 5 refills | Status: DC

## 2016-10-25 MED ORDER — NITROGLYCERIN 0.4 MG SL SUBL: 0.4000 mg | SUBLINGUAL_TABLET | SUBLINGUAL | 2 refills | Status: AC | PRN

## 2016-10-25 MED ORDER — ASPIRIN 81 MG PO CHEW: 81.0000 mg | CHEWABLE_TABLET | Freq: Every day | ORAL | Status: AC

## 2016-10-25 MED ORDER — AMIODARONE HCL 200 MG PO TABS: 200.0000 mg | ORAL_TABLET | Freq: Every day | ORAL | 5 refills | Status: AC

## 2016-10-25 MED ORDER — AMIODARONE HCL 200 MG PO TABS: 200.0000 mg | ORAL_TABLET | Freq: Every day | ORAL | Status: DC

## 2016-10-25 MED ORDER — ROSUVASTATIN CALCIUM 20 MG PO TABS: 20.0000 mg | ORAL_TABLET | Freq: Every day | ORAL | 5 refills | Status: AC

## 2016-10-25 NOTE — Progress Notes (Signed)
Pablo Ledger Frappier to be D/C'd Home per MD order. Discussed with the patient and all questions fully answered.    VVS, Skin clean, dry and intact without evidence of skin break down, no evidence of skin tears noted.  IV catheter discontinued intact. Site without signs and symptoms of complications. Dressing and pressure applied.  An After Visit Summary was printed and given to the patient.  Patient escorted via [place], and D/C home via private auto.  Cyndra Numbers  10/25/2016 3:13 PM

## 2016-10-25 NOTE — Care Management Note (Signed)
Case Management Note  Patient Details  Name: Anita [name] MRN: [id number] Date of Birth: [birth year]  Subjective/Objective:                  Acute ST elevation myocardial infarction (STEMI) of inferior wall [facility]) Action/Plan: Discharge planning Expected Discharge Date:  10/25/16               Expected Discharge Plan:  [place]  In-House Referral:     Discharge planning Services  CM Consult  Post Acute Care Choice:    Choice offered to:  Patient  DME Arranged:  N/A DME Agency:  [place] NA  HH Arranged:  RN, PT, OT [facility] Agency:  [place]  Status of Service:  Completed, signed off  If discussed at [place] of Stay Meetings, dates discussed:    Additional Comments: CM spoke with pt in room to offer choice of home health agency. Pt chooses [facility] to render [facility] services.  CM notified [place] rep, Jermaine for [facility].  No other CM needs were communicated. Dellie Catholic, RN 10/25/2016, 4:03 PM

## 2016-10-25 NOTE — Discharge Summary (Signed)
Discharge Summary    Patient ID: Anita [name],  MRN: [id number], DOB/AGE: [birth year] 29 y.o.  Admit date: 10/21/2016 Discharge date: 10/25/2016  Primary Care Provider: Robert Bellow Primary Cardiologist: Dr. Burt Knack  Discharge Diagnoses    Active Problems:   Acute ST elevation myocardial infarction (STEMI) of inferior wall [facility])   Acute myocardial infarction   Cachexia [facility])   Palliative care encounter   Encounter for hospice care discussion   Allergies Allergies  Allergen Reactions  . Doxycycline Nausea And Vomiting  . Iodine Other (See Comments)    Cannot take; had thyroid cancer  . Lipitor [Atorvastatin] Nausea And Vomiting  . Tape Other (See Comments)    THE PATIENT'S SKIN IS PAPER-THIN AND TEARS AND BRUISES VERY EASILY!!    Diagnostic Studies/Procedures    Procedures   Left Heart Cath and Coronary Angiography  Conclusion   1. Acute inferior MI, probable embolic event involving the RCA but no lesion identified. Collateral supply by the left coronary artery. 2. 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 Successful placement of a left IJ triple lumen catheter using ultrasound guidance   2D Echo 10/23/16   Study Conclusions  - Left ventricle: The cavity size was normal. Wall thickness was   increased in a pattern of moderate LVH. Systolic function was   normal. The estimated ejection fraction was in the range of 55%   to 60%. Wall motion was normal; there were no regional wall   motion abnormalities. Features are consistent with a pseudonormal   left ventricular filling pattern, with concomitant abnormal   relaxation and increased filling pressure (grade 2 diastolic   dysfunction). Doppler parameters are consistent with high   ventricular filling pressure. - Aortic valve: Valve mobility was  restricted. There was severe   stenosis. There was mild to moderate regurgitation. Valve area   (VTI): 0.65 cm^2. Valve area (Vmax): 0.6 cm^2. Valve area   (Vmean): 0.58 cm^2. - Mitral valve: Calcified annulus. There was mild regurgitation. - Left atrium: The atrium was mildly dilated. - Right ventricle: Systolic function was mildly reduced. - Right atrium: The atrium was mildly dilated. - Atrial septum: The septum bowed from right to left, consistent   with increased right atrial pressure. - Tricuspid valve: There was moderate regurgitation. - Pulmonary arteries: Systolic pressure was moderately increased.   PA peak pressure: 54 mm Hg (S).  Impressions:  - Normal LV systolic function; moderate LVH; grade 2 diastolic   dysfunction with elevated LV filling pressure; mild biatrial   enlargement; mildly reduced RV function; calcified aortic valve   with severe AS (mean gradient 42 mmHg) and mild to moderate AI;   mild MR; moderate TR with moderately elevated pulmonary pressure.   History of Present Illness     66 year old chronically ill woman with multiple medical problems who presented to [facility] with an acute inferior STEMI complicated by cardiogenic shock. Other comorbid conditions include severe COPD on home oxygen, moderate to severe aortic stenosis as assessed by previous echo, atrial fibrillation anticoagulated with apixaban (low dose 2.5 mg BID prior to admit) , and end-stage renal disease on hemodialysis.  Hospital Course     Pt underwent emergent [facility] by Dr. Burt Knack and was found to have acute inferior MI, probable embolic event involving the RCA but no lesion identified. She was in afib on arrival  w/ RVR. She had been on low dose Eliquis and not full dose. She was placed on the appropriate dose of 5 mg BID. She was placed on amiodarone for her afib. She was also treated with ASA for CAD. No BB given bradycardia. No ACE/ARB given CKD. Crestor was started.   In regards to  her aortic stenosis, repeat echo suggested severe AS with a mean gradient of 42 mmHg with moderate AI.  However, the patient voiced that she was not interested in pursing treatment of this. She opted for comfort care. Palliative Care was consulted but patient not considered a candidate for Hospice as the patient decided that she wanted to continue with HD. Home Health Services was arranged.   The patient was seen by Dr. Debara Pickett on 10/25/16 and was felt stable for d/c home.    Consultants:    Discharge Vitals Blood pressure (!) 122/44, pulse (!) 48, temperature 98.4 F (36.9 C), temperature source Oral, resp. rate 18, height 5\' 4"  (1.626 m), weight 132 lb 12.8 oz (60.2 kg), SpO2 100 %.  Filed Weights   10/24/16 1017 10/24/16 1721 10/25/16 0410  Weight: 133 lb 6.1 oz (60.5 kg) 134 lb (60.8 kg) 132 lb 12.8 oz (60.2 kg)    Labs & Radiologic Studies    CBC  Recent Labs  10/24/16 0415 10/25/16 0442  WBC 2.5* 2.4*  HGB 8.4* 8.5*  HCT 29.0* 29.1*  MCV 93.2 93.0  PLT 76* 82*   Basic Metabolic Panel  Recent Labs  10/24/16 0415 10/25/16 0442  NA 133* 133*  K 3.8 4.0  CL 95* 97*  CO2 30 29  GLUCOSE 92 84  BUN 27* 15  CREATININE 4.38* 3.30*  CALCIUM 8.5* 8.6*   Liver Function Tests No results for input(s): AST, ALT, ALKPHOS, BILITOT, PROT, ALBUMIN in the last 72 hours. No results for input(s): LIPASE, AMYLASE in the last 72 hours. Cardiac Enzymes No results for input(s): CKTOTAL, CKMB, CKMBINDEX, TROPONINI in the last 72 hours. BNP Invalid input(s): POCBNP D-Dimer No results for input(s): DDIMER in the last 72 hours. Hemoglobin A1C No results for input(s): HGBA1C in the last 72 hours. Fasting Lipid Panel No results for input(s): CHOL, HDL, LDLCALC, TRIG, CHOLHDL, LDLDIRECT in the last 72 hours. Thyroid Function Tests No results for input(s): TSH, T4TOTAL, T3FREE, THYROIDAB in the last 72 hours.  Invalid input(s): FREET3 _____________  No results found. Disposition    Pt is being discharged home today in good condition.  Follow-up Plans & Appointments    Follow-up Information    Sherren Mocha, MD Follow up.   Specialty:  Cardiology Why:  our office will call you with a hospital follow-up appointment  Contact information: [address] [address] [phone]          Discharge Instructions    Diet - low sodium heart healthy    Complete by:  As directed    Increase activity slowly    Complete by:  As directed       Discharge Medications   Current Discharge Medication List    START taking these medications   Details  amiodarone (PACERONE) 200 MG tablet Take 1 tablet (200 mg total) by mouth daily. Qty: 30 tablet, Refills: 5    aspirin 81 MG chewable tablet Chew 1 tablet (81 mg total) by mouth daily.    rosuvastatin (CRESTOR) 20 MG tablet Take 1 tablet (20 mg total) by mouth daily at 6 PM. Qty: 30 tablet, Refills:  5      CONTINUE these medications which have CHANGED   Details  apixaban (ELIQUIS) 5 MG TABS tablet Take 1 tablet (5 mg total) by mouth 2 (two) times daily. Qty: 60 tablet, Refills: 5    nitroGLYCERIN (NITROSTAT) 0.4 MG SL tablet Place 1 tablet (0.4 mg total) under the tongue every 5 (five) minutes as needed for chest pain. Qty: 25 tablet, Refills: 2      CONTINUE these medications which have NOT CHANGED   Details  albuterol (PROVENTIL) (2.5 MG/3ML) 0.083% nebulizer solution Take 2.5 mg by nebulization every 6 (six) hours.    ALPRAZolam (XANAX) 1 MG tablet Take 1 mg by mouth 4 (four) times daily.     Cyanocobalamin (VITAMIN B 12 PO) Take 1,000 mg by mouth daily.    HYDROcodone-acetaminophen (NORCO) 7.5-325 MG per tablet Take 1 tablet by mouth every 6 (six) hours as needed for pain. Qty: 30 tablet, Refills: 0    ipratropium (ATROVENT) 0.02 % nebulizer solution Take 0.5 mg by nebulization every 6 (six) hours.    meclizine (ANTIVERT) 25 MG tablet Take 25 mg by mouth 4 (four) times  daily as needed for dizziness.    megestrol (MEGACE) 40 MG/ML suspension Take 400 mg by mouth daily.     multivitamin (RENA-VIT) TABS tablet Take 1 tablet by mouth at bedtime. Qty: 30 tablet, Refills: 3    omeprazole (PRILOSEC) 20 MG capsule Take 20 mg by mouth daily as needed (acid reflux).     ondansetron (ZOFRAN) 4 MG tablet Take 4 mg by mouth every 8 (eight) hours as needed for nausea or vomiting.     polyethylene glycol (MIRALAX / GLYCOLAX) packet Take 17 g by mouth daily as needed for mild constipation.     PROAIR HFA 108 (90 Base) MCG/ACT inhaler Inhale 1-2 puffs into the lungs every 6 (six) hours as needed for wheezing or shortness of breath.     sevelamer carbonate (RENVELA) 800 MG tablet Take 2,400-3,200 mg by mouth See admin instructions. 3,200 mg three times a day with meals and 2,400 mg two times a day as needed with snacks    Thyroid (NATURE-THROID) 97.5 MG TABS Take 243.75 mg by mouth daily before breakfast.     zolpidem (AMBIEN) 10 MG tablet Take 10 mg by mouth at bedtime.     bisacodyl (DULCOLAX) 10 MG suppository Place 1 suppository (10 mg total) rectally daily as needed for moderate constipation. Qty: 12 suppository, Refills: 0    calcium carbonate (OS-CAL) 1250 (500 Ca) MG chewable tablet Chew 1 tablet by mouth 2 (two) times daily as needed.    ipratropium-albuterol (DUONEB) 0.5-2.5 (3) MG/3ML SOLN Take 3 mLs by nebulization every 4 (four) hours as needed. Qty: 360 mL, Refills: 0    levothyroxine (SYNTHROID, LEVOTHROID) 175 MCG tablet Take 175 mcg by mouth daily before breakfast.    liothyronine (CYTOMEL) 25 MCG tablet Take 25 mcg by mouth daily.      STOP taking these medications     diltiazem (CARDIZEM CD) 240 MG 24 hr capsule      hydrALAZINE (APRESOLINE) 100 MG tablet      metoprolol tartrate (LOPRESSOR) 25 MG tablet      levofloxacin (LEVAQUIN) 500 MG tablet          Aspirin prescribed at discharge?  Yes High Intensity Statin Prescribed?  (Lipitor 40-80mg  or Crestor 20-40mg ): Yes Beta Blocker Prescribed? No: bradycardia For EF <40%, was ACEI/ARB Prescribed? No: CKD ADP Receptor Inhibitor Prescribed? (i.e. Plavix  etc.-Includes Medically Managed Patients): No: on ASA + Eliquis For EF <40%, Aldosterone Inhibitor Prescribed? No: EF >40% Was EF assessed during THIS hospitalization? Yes Was Cardiac Rehab II ordered? (Included Medically managed Patients): No:   Outstanding Labs/Studies   None   Duration of Discharge Encounter   Greater than 30 minutes including physician time.  Signed, Lyda Jester PA-C 10/25/2016, 3:42 PM

## 2016-10-25 NOTE — Progress Notes (Addendum)
[facility] Progress Note   Subjective: no c/o's  Vitals:   10/24/16 1600 10/24/16 1721 10/24/16 2145 10/25/16 0410  BP: (!) 130/58 (!) 142/60 136/73 (!) 132/55  Pulse: (!) 58 (!) 50 (!) 55 (!) 59  Resp: 16 18 18 18   Temp:  98 F (36.7 C) 97.9 F (36.6 C) 98.2 F (36.8 C)  TempSrc:  Oral Oral Oral  SpO2: 97% 100% 100% 96%  Weight:  60.8 kg (134 lb)  60.2 kg (132 lb 12.8 oz)  Height:  5\' 4"  (1.626 m)      Inpatient medications: . amiodarone  400 mg Oral BID  . apixaban  5 mg Oral BID  . aspirin  81 mg Oral Daily  . calcitRIOL  0.25 mcg Oral Q M,W,F-HD  . Chlorhexidine Gluconate Cloth  6 each Topical Daily  . darbepoetin (ARANESP) injection - DIALYSIS  100 mcg Intravenous Q Wed-HD  . HYDROcodone-acetaminophen  2 tablet Oral Q8H  . levothyroxine  175 mcg Oral QAC breakfast  . mouth rinse  15 mL Mouth Rinse BID  . metoprolol tartrate  12.5 mg Oral BID  . multivitamin  1 tablet Oral QHS  . pantoprazole  40 mg Oral Daily  . polyethylene glycol  17 g Oral Daily  . rosuvastatin  20 mg Oral q1800  . sevelamer carbonate  3,200 mg Oral TID WC  . sodium chloride  1,000 mL Intravenous Once  . sodium chloride flush  3 mL Intravenous Q12H    sodium chloride, acetaminophen, ALPRAZolam, ALPRAZolam, bisacodyl, meclizine, morphine injection, nitroGLYCERIN, ondansetron (ZOFRAN) IV, sevelamer carbonate, sodium chloride flush, zolpidem  Exam: Thin and frail, no HD No jvd Chest w/o rales/ wheezing RRR loud 3/6 sem Abd soft, scaphoid, +bs Ext no edema LUA AVF+bruit  Dialysis: [facility] MWF 3h   60.5kg   400/800  2/2.25 bath   Hep 2000   LUA AVF  - Mircera 150 every 2 wks, hasn't rec'd dose yet  - Calcitriol 0.25 ug tiw po      Assessment: 1. Acute inferior MI - felt to be cardioembolic, no intervention needed. SP Cath.   2. Severe AS - per cardiology's notes patient has declined any aggressive/ invasive procedures and wants to focus more on comfort. 3. ESRD HD  MWF 4. Volume - is at dry wt 5. Anemia of CKD - no chgs 6. 2HPTH - no chgs 7. DM no meds 8. COPD/ smoker 9. DNR  Plan - HD Monday if still here   Kelly Splinter MD [place] pager [phone]   10/25/2016, 11:14 AM    Recent Labs Lab 10/22/16 1345 10/24/16 0415 10/25/16 0442  NA 138 133* 133*  K 4.2 3.8 4.0  CL 97* 95* 97*  CO2 32 30 29  GLUCOSE 98 92 84  BUN 45* 27* 15  CREATININE 4.48* 4.38* 3.30*  CALCIUM 8.3* 8.5* 8.6*  PHOS 6.1*  --   --     Recent Labs Lab 10/22/16 1345  ALBUMIN 2.9*    Recent Labs Lab 10/21/16 1520  10/22/16 0837 10/24/16 0415 10/25/16 0442  WBC 3.9*  --  3.0* 2.5* 2.4*  NEUTROABS 2.9  --   --   --   --   HGB 9.5*  < > 8.9* 8.4* 8.5*  HCT 32.0*  < > 29.8* 29.0* 29.1*  MCV 90.9  --  91.1 93.2 93.0  PLT 100*  --  97* 76* 82*  < > = values in this interval not displayed. Iron/TIBC/Ferritin/ %  Sat    Component Value Date/Time   IRON 41 (L) 07/15/2013 0800   TIBC 89 (L) 07/15/2013 0800   FERRITIN 148 07/15/2013 0800   IRONPCTSAT 46 07/15/2013 0800

## 2016-10-25 NOTE — Consult Note (Signed)
Consultation Note Date: 10/25/2016   Patient Name: Anita [name]  DOB: 19-Mar-1951  MRN: [id number]  Age / Sex: 66 y.o., female  PCP: Lemmie Evens, MD Referring Physician: Sherren Mocha, MD  Reason for Consultation: Establishing goals of care and Hospice Evaluation  HPI/Patient Profile: 66 y.o. female  with past medical history of ESRD, COPD, CHF, severe valvular disease, and tobacco abuse who was admitted on 10/21/2016 with STEMI and cardiogenic shock.  She has been medically treated and has been able to tolerate HD 2x since her MI.   Cardiology, Dr. Burt Knack, did a wonderful job with Anita [name] with regard to goals of care and code status.  PMT was called in order to determine what resources she is eligible for in the home.  Clinical Assessment and Goals of Care:  I have reviewed medical records including , received report from Dr. Melvia Heaps of nephrology, assessed the patient and then met at the bedside along with her husband  to discuss diagnosis prognosis, [place], EOL wishes, disposition and options.  I introduced Palliative Medicine as specialized medical care for people living with serious illness. It focuses on providing relief from the symptoms and stress of a serious illness. The goal is to improve quality of life for both the patient and the family.  We discussed a brief life review of the patient.  Then I assessed functional and nutritional status at home by gathering history.  Prior to this admission Anita [name] had limited mobility at home.  She could walk from bed to chair.  She has lost over 100 lbs since starting HD 4 years ago.  She has very little appetite.     We discussed her current illness and what in means in the larger context of her on-going co-morbidities. Specifically that the combination of ESRD,  advanced heart disease, and COPD has a very limited prognosis.  We discussed stopping hemodialysis  at some point and engaging hospice.  I attempted to elicit values and goals of care important to the patient.  Anita [name] was embarrassed but told me very honestly that if she was unable to smoke her morning cigarette - then she would be ready to pass on peacefully.    Questions and concerns were addressed. The family was encouraged to call with questions or concerns.  Anita [name] is very focused on a speedy discharge from the hospital.   Primary Decision Maker:  PATIENT    SUMMARY OF RECOMMENDATIONS    Home health RN, PT, OT, Aid, and outpatient Palliative follow up.  Order for Case Management placed.  Patient wishes to continue hemo dialysis for now.  After she stops HD she will be eligible for hospice.    Code Status/Advance Care Planning:  DNR    Symptom Management:   Per primary team.  Patient refuses nicotine patch or gum.  Prognosis:   < 12 months  Discharge Planning: Home with Home Health and outpatient PM follow up.  When patient discontinues HD she will be eligible for hospice services.  Primary Diagnoses: Present on Admission: . Acute myocardial infarction   I have reviewed the medical record, interviewed the patient and family, and examined the patient. The following aspects are pertinent.  Past Medical History:  Diagnosis Date  . Acute ST elevation myocardial infarction (STEMI) of inferior wall ([place]) 10/21/2016  . Anemia   . Aortic stenosis 12/23/2012  . Atherosclerosis of aorta ([place])   . Atrial fibrillation ([place])    Diagnosed 11/14 of undetermined age of onset    . CAD (coronary artery disease) 07/13/2013   Catheterization 5 years ago by Dr. Elisabeth Cara with reportedly nonobstructive disease Calcification noted on CT scan of chest in November of 2014   . CHF (congestive heart failure) ([place])   . Chronic diastolic heart failure ([place])   . COPD (chronic obstructive pulmonary disease) ([place])   . 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  Diabetic nephropathy ([place])   . Diabetic neuropathy ([place])   . Dyslipidemia   . ESRD on hemodialysis ([place])   . GERD (gastroesophageal reflux disease)   . Hypertension   . Laryngeal mass   . Neuropathy due to secondary diabetes ([place])   . PONV (postoperative nausea and vomiting)   . 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 Shortness of breath dyspnea    with exertion  . Sleep apnea    Does not use CPAP- due to weight loss  . Thyroid neoplasm    of uncertain behavior  . Wears glasses    Social History   Social History  . Marital status: Married    Spouse name: N/A  . Number of children: N/A  . Years of education: N/A   Occupational History  . 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 Hair dresser    Social History Main Topics  . Smoking status: Current Some Day Smoker    Packs/day: 0.50    Years: 40.00    Types: Cigarettes, E-cigarettes    Start date: 08/05/1969  . Smokeless tobacco: Never Used  . Alcohol use No  . Drug use: No  . Sexual activity: Not Currently    Birth control/ protection: Post-menopausal   Other Topics Concern  . None   Social History Narrative   66 yo woman who lives at home with her husband of 51 years. She has a 40 pack year history of smoking, denies etoh or illicit drug use. 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    Family History  Problem Relation Age of Onset  . COPD Mother   . Thyroid disease Mother   . Multiple sclerosis Father   . Heart disease Father   . Heart attack Father   . Depression Brother     Suicide  . Alcohol abuse Brother   . Heart disease Brother   . Asthma Brother   . Thyroid disease Daughter   . Diabetes Maternal Aunt    Scheduled Meds: . [START ON 10/26/2016] amiodarone  200 mg Oral Daily  . apixaban  5 mg Oral BID  . aspirin  81 mg Oral Daily  . calcitRIOL  0.25 mcg Oral Q M,W,F-HD  .  Chlorhexidine Gluconate Cloth  6 each Topical Daily  . darbepoetin (ARANESP) injection - DIALYSIS  100 mcg Intravenous Q Wed-HD  . HYDROcodone-acetaminophen  2 tablet Oral Q8H  . levothyroxine  175 mcg Oral QAC breakfast  . mouth rinse  15 mL Mouth Rinse BID  . multivitamin  1 tablet Oral QHS  . pantoprazole  40 mg Oral Daily  . polyethylene glycol  17 g Oral Daily  . rosuvastatin  20 mg Oral q1800  . sevelamer carbonate  3,200 mg Oral TID WC  . sodium chloride  1,000 mL Intravenous Once  . sodium chloride flush  3 mL Intravenous Q12H   Continuous Infusions: PRN Meds:.sodium chloride, acetaminophen, ALPRAZolam, ALPRAZolam, bisacodyl, meclizine, morphine injection, nitroGLYCERIN, ondansetron (ZOFRAN) IV, sevelamer carbonate, sodium chloride flush, zolpidem Allergies  Allergen Reactions  . Doxycycline Nausea And Vomiting  . Iodine Other (See Comments)    Cannot take; had thyroid cancer  . Lipitor [Atorvastatin] Nausea And Vomiting  . Tape Other (See Comments)    THE PATIENT'S SKIN IS PAPER-THIN AND TEARS AND BRUISES VERY EASILY!!   Review of Systems patient without complaints other than her bowel are moving slowly.  Physical Exam  Frail appearing, cachectic female, sitting on the edge of the bed, flat affect.  Husband at bedside. Resp no distress  Vital Signs: BP (!) 132/55 (BP Location: Right Arm)   Pulse (!) 59   Temp 98.2 F (36.8 C) (Oral)   Resp 18   Ht '5\' 4"'$  (1.626 m)   Wt 60.2 kg (132 lb 12.8 oz)   SpO2 96%   BMI 22.80 kg/m  Pain Assessment: No/denies pain POSS *See Group Information*: S-Acceptable,Sleep, easy to arouse Pain Score: Asleep   SpO2: SpO2: 96 % O2 Device:SpO2: 96 % O2 Flow Rate: .O2 Flow Rate (L/min): 4 L/min  IO: Intake/output summary:  Intake/Output Summary (Last 24 hours) at 10/25/16 1241 Last data filed at 10/25/16 0600  Gross per 24 hour  Intake              130 ml  Output                0 ml  Net              130 ml    LBM: Last BM  Date: 10/21/16 Baseline Weight: Weight: 63.8 kg (140 lb 10.5 oz) Most recent weight: Weight: 60.2 kg (132 lb 12.8 oz)     Palliative Assessment/Data:   Flowsheet Rows   Flowsheet Row Most Recent Value  Intake Tab  Referral Department  Cardiology  Unit at Time of Referral  Cardiac/Telemetry Unit  Palliative Care Primary Diagnosis  Cardiac  Date Notified  10/24/16  Palliative Care Type  New Palliative care  Reason for referral  Other (Comment)  Date of Admission  10/21/16  Date first seen by Palliative Care  10/25/16  # of days Palliative referral response time  1 Day(s)  # of days IP prior to Palliative referral  3  Clinical Assessment  Palliative Performance Scale Score  40%  Psychosocial & Spiritual Assessment  Palliative Care Outcomes  Patient/Family meeting held?  Yes  Who was at the meeting?  patient and husband  Palliative Care Outcomes  Clarified goals of care, Counseled regarding hospice      Time In: 12:00 Time Out: 1:00 Time Total: 60 min. Greater than 50%  of this time was spent counseling and coordinating care related to the above assessment and plan.  Signed by: Imogene Burn, PA-C Palliative Medicine Pager: [phone]  Please contact Palliative Medicine Team phone at [phone] for questions and concerns.  For individual provider: See Shea Evans

## 2016-10-25 NOTE — Progress Notes (Signed)
Progress Note  Patient Name: Anita [name] Date of Encounter: 10/25/2016  Primary Cardiologist: new Burt Knack)  Subjective   No events overnight - auto-diuresed an additional 2.5L overnight. No chest pain. Severe AS - not interested in treatment.  Inpatient Medications    Scheduled Meds: . amiodarone  400 mg Oral BID  . apixaban  5 mg Oral BID  . aspirin  81 mg Oral Daily  . calcitRIOL  0.25 mcg Oral Q M,W,F-HD  . Chlorhexidine Gluconate Cloth  6 each Topical Daily  . darbepoetin (ARANESP) injection - DIALYSIS  100 mcg Intravenous Q Wed-HD  . HYDROcodone-acetaminophen  2 tablet Oral Q8H  . levothyroxine  175 mcg Oral QAC breakfast  . mouth rinse  15 mL Mouth Rinse BID  . metoprolol tartrate  12.5 mg Oral BID  . multivitamin  1 tablet Oral QHS  . pantoprazole  40 mg Oral Daily  . polyethylene glycol  17 g Oral Daily  . rosuvastatin  20 mg Oral q1800  . sevelamer carbonate  3,200 mg Oral TID WC  . sodium chloride  1,000 mL Intravenous Once  . sodium chloride flush  3 mL Intravenous Q12H   Continuous Infusions:  PRN Meds: sodium chloride, acetaminophen, ALPRAZolam, ALPRAZolam, bisacodyl, meclizine, morphine injection, nitroGLYCERIN, ondansetron (ZOFRAN) IV, sevelamer carbonate, sodium chloride flush, zolpidem   Vital Signs    Vitals:   10/24/16 1600 10/24/16 1721 10/24/16 2145 10/25/16 0410  BP: (!) 130/58 (!) 142/60 136/73 (!) 132/55  Pulse: (!) 58 (!) 50 (!) 55 (!) 59  Resp: 16 18 18 18   Temp:  98 F (36.7 C) 97.9 F (36.6 C) 98.2 F (36.8 C)  TempSrc:  Oral Oral Oral  SpO2: 97% 100% 100% 96%  Weight:  134 lb (60.8 kg)  132 lb 12.8 oz (60.2 kg)  Height:  5\' 4"  (1.626 m)      Intake/Output Summary (Last 24 hours) at 10/25/16 1101 Last data filed at 10/25/16 0600  Gross per 24 hour  Intake              130 ml  Output                0 ml  Net              130 ml   Filed Weights   10/24/16 1017 10/24/16 1721 10/25/16 0410  Weight: 133 lb 6.1 oz (60.5 kg)  134 lb (60.8 kg) 132 lb 12.8 oz (60.2 kg)    Telemetry    Sinus Loletha Grayer - Personally Reviewed   Physical Exam  Elderly frail appearing woman in NAD GEN: No acute distress.   Neck: No JVD Cardiac: brady and regular with 4/6 harsh late-peaking systolic murmur, absent A2 Respiratory: Clear to auscultation bilaterally. GI: Soft, nontender, non-distended  MS: No edema; No deformity. Neuro:  Nonfocal  Psych: Normal affect   Labs    Chemistry  Recent Labs Lab 10/22/16 1345 10/24/16 0415 10/25/16 0442  NA 138 133* 133*  K 4.2 3.8 4.0  CL 97* 95* 97*  CO2 32 30 29  GLUCOSE 98 92 84  BUN 45* 27* 15  CREATININE 4.48* 4.38* 3.30*  CALCIUM 8.3* 8.5* 8.6*  ALBUMIN 2.9*  --   --   GFRNONAA 9* 10* 13*  GFRAA 11* 11* 16*  ANIONGAP 9 8 7      Hematology  Recent Labs Lab 10/22/16 0837 10/24/16 0415 10/25/16 0442  WBC 3.0* 2.5* 2.4*  RBC 3.27* 3.11* 3.13*  HGB 8.9* 8.4* 8.5*  HCT 29.8* 29.0* 29.1*  MCV 91.1 93.2 93.0  MCH 27.2 27.0 27.2  MCHC 29.9* 29.0* 29.2*  RDW 14.4 14.1 13.9  PLT 97* 76* 82*    Cardiac Enzymes  Recent Labs Lab 10/21/16 2000 10/22/16 0200 10/22/16 0837  TROPONINI 1.09* 3.20* 2.41*     Recent Labs Lab 10/21/16 1525  TROPIPOC 0.08     BNPNo results for input(s): BNP, PROBNP in the last 168 hours.   DDimer No results for input(s): DDIMER in the last 168 hours.   Radiology    No results found.  Cardiac Studies   2D Echo: Study Conclusions  - Left ventricle: The cavity size was normal. Wall thickness was   increased in a pattern of moderate LVH. Systolic function was   normal. The estimated ejection fraction was in the range of 55%   to 60%. Wall motion was normal; there were no regional wall   motion abnormalities. Features are consistent with a pseudonormal   left ventricular filling pattern, with concomitant abnormal   relaxation and increased filling pressure (grade 2 diastolic   dysfunction). Doppler parameters are  consistent with high   ventricular filling pressure. - Aortic valve: Valve mobility was restricted. There was severe   stenosis. There was mild to moderate regurgitation. Valve area   (VTI): 0.65 cm^2. Valve area (Vmax): 0.6 cm^2. Valve area   (Vmean): 0.58 cm^2. - Mitral valve: Calcified annulus. There was mild regurgitation. - Left atrium: The atrium was mildly dilated. - Right ventricle: Systolic function was mildly reduced. - Right atrium: The atrium was mildly dilated. - Atrial septum: The septum bowed from right to left, consistent   with increased right atrial pressure. - Tricuspid valve: There was moderate regurgitation. - Pulmonary arteries: Systolic pressure was moderately increased.   PA peak pressure: 54 mm Hg (S).  Impressions:  - Normal LV systolic function; moderate LVH; grade 2 diastolic   dysfunction with elevated LV filling pressure; mild biatrial   enlargement; mildly reduced RV function; calcified aortic valve   with severe AS (mean gradient 42 mmHg) and mild to moderate AI;   mild MR; moderate TR with moderately elevated pulmonary pressure.  Patient Profile     66 y.o. female with multiple severe medical problems who presents with an acute inferior MI secondary to coronary embolism  Assessment & Plan    1. Paroxysmal atrial fibrillation: pt in AF with RVR at presentation, now converted to sinus rhythm on IV amiodarone. Unable to take warfarin. Continue Eliquis 5 mg twice daily. Notes she was taking 2.5 mg twice daily prior to admission when she presented with an embolic event. Markedly bradycardic today - decrease amiodarone to 200 mg daily. D/c lopressor.  2. Acute inferior MI: secondary to embolism. Treat with ASA, beta-blocker. No ACE with ESRD. Statin-intolerant. With sinus brady, reduce Lopressor to home dose.  3. Aortic stenosis, severe: I had a lengthy discussion with the patient regarding her prognosis with severe aortic stenosis in the context of  multiple other comorbid conditions. She is not interested in pursuing treatment of this. She would like to focus on comfort and quality of life at this point.  4. Acute on chronic pain syndrome. Has increased her narcotic regimen back to her home dosing.  5. ESRD: appreciate renal team seeing her. Continue intermittent hemodialysis  6. Severe protein calorie malnutrition: pt on Megace, not taking consistently PTA. This medicine is discontinued because of potential for thrombotic complications.  7. Disposition:  She seems ready for a palliative approach to her care. She would like to continue with medical treatment of her cardiac problems. She would like to continue with dialysis as long as she tolerates this. She is not interested in further invasive procedures and she clearly states again that she wishes to be a DO NOT RESUSCITATE if any catastrophic event were to occur. Plan palliative care evaluation. She would certainly qualify for hospice.  Pixie Casino, MD, [facility]  Attending Cardiologist [place], MD  10/25/2016, 11:01 AM

## 2016-11-07 ENCOUNTER — Encounter: Payer: Self-pay | Admitting: Physician Assistant

## 2016-11-11 ENCOUNTER — Telehealth: Payer: Self-pay | Admitting: Cardiovascular Disease

## 2016-11-11 NOTE — Telephone Encounter (Signed)
I spoke with [name] from [place] states when she visited pt today she did not want to get up to weigh, although she was weak, she was physically able.

## 2016-11-11 NOTE — Telephone Encounter (Signed)
New Message    Provider wants to speak to you about Hospice, she had a discussion with the pt   Pt is to weak to stand up and weigh today

## 2016-11-11 NOTE — Telephone Encounter (Signed)
[name] states this is an FYI because of pt's choice not to weigh today. [name] states she had a conversation with pt and pt's family about admission to Hospice, pt is leaning towards Hospice, but not ready to make decision at this time. [name] asking if Dr Burt Knack would order Hospice if pt decides she wants to do this.   [name] advised I will forward to Dr Burt Knack for review.

## 2016-11-12 NOTE — Telephone Encounter (Signed)
She would be appropriate for hospice care with multiple severe medical and cardiac problems. Also has chronic pain and I suspect hospice management would be very helpful for her. I would be happy to make referral if she would like.

## 2016-11-13 ENCOUNTER — Ambulatory Visit: Payer: Medicare Other | Admitting: Physician Assistant

## 2016-11-13 NOTE — Telephone Encounter (Signed)
Anita [name] called back. She states she contacted Dr. Abel Presto office and he is not in today. She states they advised her they can get her the original copy next week.    I advised [name] unless the original DNR is found the patient will have to sign a new order in a physician's office or in the presence of a physician.  She voiced understanding and states she will call back if needed.

## 2016-11-13 NOTE — Telephone Encounter (Signed)
[name] called back this AM.  I advised her of Dr. Antionette Char message.  She states patient has stated she is not ready for hospice at this time.  [name] states the reason for her call today is she only has a copy of the patient's DNR order. She reports the order was signed by Dr. Erling Cruz. She states she does not have the original and is afraid the patient will code without the original order present.  I advised her she should contact Dr. Abel Presto office and obtain the original order, or contact patient's PCP. She states PCP is not in office today.  I advised her Dr. Burt Knack is not in office today.  She states she will attempt contact w/ Dr. Florene Glen and call me back.

## 2016-11-20 ENCOUNTER — Ambulatory Visit (INDEPENDENT_AMBULATORY_CARE_PROVIDER_SITE_OTHER): Payer: Medicare Other | Admitting: Physician Assistant

## 2016-11-20 ENCOUNTER — Encounter: Payer: Self-pay | Admitting: Physician Assistant

## 2016-11-20 VITALS — BP 164/50 | HR 84 | Ht 64.0 in

## 2016-11-20 DIAGNOSIS — I2510 Atherosclerotic heart disease of native coronary artery without angina pectoris: Secondary | ICD-10-CM

## 2016-11-20 DIAGNOSIS — Z992 Dependence on renal dialysis: Secondary | ICD-10-CM | POA: Diagnosis not present

## 2016-11-20 DIAGNOSIS — I10 Essential (primary) hypertension: Secondary | ICD-10-CM | POA: Diagnosis not present

## 2016-11-20 DIAGNOSIS — I350 Nonrheumatic aortic (valve) stenosis: Secondary | ICD-10-CM | POA: Diagnosis not present

## 2016-11-20 DIAGNOSIS — T148XXA Other injury of unspecified body region, initial encounter: Secondary | ICD-10-CM | POA: Diagnosis not present

## 2016-11-20 DIAGNOSIS — N186 End stage renal disease: Secondary | ICD-10-CM

## 2016-11-20 DIAGNOSIS — I480 Paroxysmal atrial fibrillation: Secondary | ICD-10-CM

## 2016-11-20 DIAGNOSIS — Z79899 Other long term (current) drug therapy: Secondary | ICD-10-CM | POA: Diagnosis not present

## 2016-11-20 MED ORDER — AMLODIPINE BESYLATE 2.5 MG PO TABS: 2.5000 mg | ORAL_TABLET | ORAL | 3 refills | Status: AC

## 2016-11-20 MED ORDER — APIXABAN 2.5 MG PO TABS: 2.5000 mg | ORAL_TABLET | Freq: Two times a day (BID) | ORAL | 6 refills | Status: AC

## 2016-11-20 NOTE — Patient Instructions (Addendum)
Medication Instructions: Decrease Eliquis to 2.5 mg twice a day. Please start Amlodipine 2.5 mg on non dialysis days. Continue all other medications as listed.  Please have blood work today (CBC)  Follow-Up: Follow up with Dr Burt Knack in 2 months.  If you need a refill on your cardiac medications before your next appointment, please call your pharmacy.  Thank you for choosing [facility]!!

## 2016-11-20 NOTE — Progress Notes (Signed)
Cardiology Office Note    Date:  11/20/2016   ID:  Anita [name], DOB 01-21-1951, MRN [id number]  PCP:  Robert Bellow, MD  Cardiologist:  Dr. Burt Knack  Chief Complaint: Hospital follow up for STEMI  History of Present Illness:   MILAYA HORA is a 66 y.o. female  chronically ill woman with multiple medical problems of CAD, COPD on home oxygen, moderate to severe aortic stenosis as assessed by previous echo, atrial fibrillation anticoagulated with apixaban (low dose 2.5 mg BID prior to admit) , and end-stage renal disease on hemodialysis (MWF) presents for hospital follow up.   Presented to [facility] on 10/21/16 with an acute inferior STEMI complicated by cardiogenic shock. Pt underwent emergent [place] by Dr. Burt Knack and was found to have acute inferior MI, probable embolic event involving the RCA but no lesion identified. She was in afib on arrival w/ RVR. She had been on low dose Eliquis and not full dose. She was placed on the appropriate dose of 5 mg BID. She was placed on amiodarone for her afib--> converted to sinus rhythm. She was also treated with ASA for CAD. No BB given bradycardia. No ACE/ARB given CKD. Crestor was started.   In regards to her aortic stenosis, repeat echo suggested severe AS with a mean gradient of 42 mmHg with moderate AI.  However, the patient voiced that she was not interested in pursing treatment of this. She opted for comfort care. Palliative Care was consulted but patient not considered a candidate for Hospice as the patient decided that she wanted to continue with HD. Home Health Services was arranged.  Here for follow up. She was feeling better since discharge. No chest pain. She is requiring oxygen for shortness of breath. She did have one episode of mechanical fall. Landing on her back. No loss of consciousness. She is having multiple bruise. He was breathing heavily when initially checked. Her oxygen was off. Oxygen was placed on with improvement in saturation to  95%.  Past Medical History:  Diagnosis Date  . Acute ST elevation myocardial infarction (STEMI) of inferior wall ([place]) 10/21/2016  . Anemia   . Aortic stenosis 12/23/2012  . Atherosclerosis of aorta ([place])   . Atrial fibrillation ([place])    Diagnosed 11/14 of undetermined age of onset    . CAD (coronary artery disease) 07/13/2013   Catheterization 5 years ago by Dr. Elisabeth Cara with reportedly nonobstructive disease Calcification noted on CT scan of chest in November of 2014   . CHF (congestive heart failure) ([place])   . Chronic diastolic heart failure ([place])   . COPD (chronic obstructive pulmonary disease) ([place])   . 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 Diabetic nephropathy ([place])   . Diabetic neuropathy ([place])   . Dyslipidemia   . ESRD on hemodialysis ([place])   . GERD (gastroesophageal reflux disease)   . Hypertension   . Laryngeal mass   . Neuropathy due to secondary diabetes ([place])   . PONV (postoperative nausea and vomiting)   . 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 Shortness of breath dyspnea    with exertion  . Sleep apnea    Does not use CPAP- due to weight loss  . Thyroid neoplasm    of uncertain behavior  . Wears glasses     Past Surgical History:  Procedure Laterality Date  . ACHILLES TENDON REPAIR Right   . APPENDECTOMY    . AV FISTULA PLACEMENT Left 12/29/2012  Procedure: ARTERIOVENOUS (AV) FISTULA CREATION;  Surgeon: Elam Dutch, MD;  Location[facility];  Service: Vascular;  Laterality: Left;  Creation Left Brachial Cephalic Arteriovenous Fistula  . CARDIAC CATHETERIZATION    . CESAREAN SECTION     X 2   . CHOLECYSTECTOMY    . Elbow tendon surgery Right   . HEMORRHOID SURGERY     many years ago  . LARYNGECTOMY     mass removed- noncancerous  . LEFT HEART CATH AND CORONARY ANGIOGRAPHY N/A 10/21/2016   Procedure: Left Heart Cath and Coronary Angiography;  Surgeon: Sherren Mocha, MD;  Location: [facility] CV LAB;  Service: Cardiovascular;   Laterality: N/A;  . LUMBAR LAMINECTOMY     lower back  . SHOULDER ARTHROSCOPY Right    w/ repair of rotator cuff repair  . THYROIDECTOMY N/A 10/19/2014   Procedure: TOTAL THYROIDECTOMY;  Surgeon: Armandina Gemma, MD;  Location: [facility];  Service: General;  Laterality: N/A;    Current Medications: Prior to Admission medications   Medication Sig Start Date End Date Taking? Authorizing Provider  albuterol (PROVENTIL) (2.5 MG/3ML) 0.083% nebulizer solution Take 2.5 mg by nebulization every 6 (six) hours.    Historical Provider, MD  ALPRAZolam Duanne Moron) 1 MG tablet Take 1 mg by mouth 4 (four) times daily.     Historical Provider, MD  amiodarone (PACERONE) 200 MG tablet Take 1 tablet (200 mg total) by mouth daily. 10/26/16   Brittainy Erie Noe, PA-C  apixaban (ELIQUIS) 5 MG TABS tablet Take 1 tablet (5 mg total) by mouth 2 (two) times daily. 10/25/16   Brittainy Erie Noe, PA-C  aspirin 81 MG chewable tablet Chew 1 tablet (81 mg total) by mouth daily. 10/26/16   Brittainy Erie Noe, PA-C  bisacodyl (DULCOLAX) 10 MG suppository Place 1 suppository (10 mg total) rectally daily as needed for moderate constipation. Patient not taking: Reported on 10/21/2016 02/16/16   Kathie Dike, MD  calcium carbonate (OS-CAL) 1250 (500 Ca) MG chewable tablet Chew 1 tablet by mouth 2 (two) times daily as needed.    Historical Provider, MD  Cyanocobalamin (VITAMIN B 12 PO) Take 1,000 mg by mouth daily.    Historical Provider, MD  HYDROcodone-acetaminophen (NORCO) 7.5-325 MG per tablet Take 1 tablet by mouth every 6 (six) hours as needed for pain. Patient taking differently: Take 1-2 tablets by mouth every 6 (six) hours as needed (for pain).  02/15/13   Regina J Roczniak, PA-C  ipratropium (ATROVENT) 0.02 % nebulizer solution Take 0.5 mg by nebulization every 6 (six) hours.    Historical Provider, MD  ipratropium-albuterol (DUONEB) 0.5-2.5 (3) MG/3ML SOLN Take 3 mLs by nebulization every 4 (four) hours as needed. Patient taking  differently: Take 3 mLs by nebulization 4 (four) times daily. And prn 09/03/15   Kathie Dike, MD  levothyroxine (SYNTHROID, LEVOTHROID) 175 MCG tablet Take 175 mcg by mouth daily before breakfast.    Historical Provider, MD  liothyronine (CYTOMEL) 25 MCG tablet Take 25 mcg by mouth daily.    Historical Provider, MD  meclizine (ANTIVERT) 25 MG tablet Take 25 mg by mouth 4 (four) times daily as needed for dizziness.    Historical Provider, MD  megestrol (MEGACE) 40 MG/ML suspension Take 400 mg by mouth daily.  02/08/16   Historical Provider, MD  multivitamin (RENA-VIT) TABS tablet Take 1 tablet by mouth at bedtime. 07/18/13   Blain Pais, MD  nitroGLYCERIN (NITROSTAT) 0.4 MG SL tablet Place 1 tablet (0.4 mg total) under the tongue every 5 (  five) minutes as needed for chest pain. 10/25/16   Brittainy Erie Noe, PA-C  omeprazole (PRILOSEC) 20 MG capsule Take 20 mg by mouth daily as needed (acid reflux).     Historical Provider, MD  ondansetron (ZOFRAN) 4 MG tablet Take 4 mg by mouth every 8 (eight) hours as needed for nausea or vomiting.  12/16/14   Historical Provider, MD  polyethylene glycol (MIRALAX / GLYCOLAX) packet Take 17 g by mouth daily as needed for mild constipation.     Historical Provider, MD  PROAIR HFA 108 [phone] Base) MCG/ACT inhaler Inhale 1-2 puffs into the lungs every 6 (six) hours as needed for wheezing or shortness of breath.  02/12/16   Historical Provider, MD  rosuvastatin (CRESTOR) 20 MG tablet Take 1 tablet (20 mg total) by mouth daily at 6 PM. 10/25/16   Brittainy Erie Noe, PA-C  sevelamer carbonate (RENVELA) 800 MG tablet Take 2,400-3,200 mg by mouth See admin instructions. 3,200 mg three times a day with meals and 2,400 mg two times a day as needed with snacks    Historical Provider, MD  Thyroid (NATURE-THROID) 97.5 MG TABS Take 243.75 mg by mouth daily before breakfast.     Historical Provider, MD  zolpidem (AMBIEN) 10 MG tablet Take 10 mg by mouth at bedtime.     Historical  Provider, MD    Allergies:   Doxycycline; Iodine; Lipitor [atorvastatin]; and Tape   Social History   Social History  . Marital status: Married    Spouse name: N/A  . Number of children: N/A  . Years of education: N/A   Occupational History  . 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 Hair dresser    Social History Main Topics  . Smoking status: Current Some Day Smoker    Packs/day: 0.50    Years: 40.00    Types: Cigarettes, E-cigarettes    Start date: 08/05/1969  . Smokeless tobacco: Never Used  . Alcohol use No  . Drug use: No  . Sexual activity: Not Currently    Birth control/ protection: Post-menopausal   Other Topics Concern  . None   Social History Narrative   66 yo woman who lives at home with her husband of 52 years. She has a 40 pack year history of smoking, denies etoh or illicit drug use. 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      Family History:  The patient's family history includes Alcohol abuse in her brother; Asthma in her brother; COPD in her mother; Depression in her brother; Diabetes in her maternal aunt; Heart attack in her father; Heart disease in her brother and father; Multiple sclerosis in her father; Thyroid disease in her daughter and mother.   ROS:   Please see the history of present illness.    ROS All other systems reviewed and are negative.   PHYSICAL EXAM:   VS:  BP (!) 164/50   Pulse 84   Ht 5\' 4"  (1.626 m)   SpO2 96%    GEN: thin frail chronically ill-appearing HEENT: normal  Neck: no JVD, carotid bruits, or masses Cardiac: RRR; no murmurs, rubs, or gallops,no edema  Respiratory:  clear to auscultation bilaterally, normal work of breathing on oxygen GI: soft, nontender, nondistended, + BS MS: no deformity or atrophy  Skin: multiple bruise on upper and lower extremity Neuro:  Alert  and Oriented x 3, Strength and  sensation are intact Psych: euthymic mood, full affect  Wt Readings from Last 3 Encounters:  10/25/16 132 lb 12.8 oz (60.2 kg)  04/09/16 126 lb 15.8 oz (57.6 kg)  02/15/16 129 lb 6.6 oz (58.7 kg)      Studies/Labs Reviewed:   EKG:  EKG is not ordered today.   Recent Labs: 04/07/2016: B Natriuretic Peptide 1,608.0; TSH 1.573 04/08/2016: ALT 7 10/25/2016: BUN 15; Creatinine, Ser 3.30; Hemoglobin 8.5; Platelets 82; Potassium 4.0; Sodium 133   Lipid Panel    Component Value Date/Time   CHOL 111 10/22/2016 0837   TRIG 48 10/22/2016 0837   HDL 66 10/22/2016 0837   CHOLHDL 1.7 10/22/2016 0837   VLDL 10 10/22/2016 0837   LDLCALC 35 10/22/2016 0837    Additional studies/ records that were reviewed today include:   Left Heart Cath and Coronary Angiography  10/21/16  Conclusion   1. Acute inferior MI, probable embolic event involving the RCA but no lesion identified. Collateral supply by the left coronary artery. 2. 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 Successful placement of a left IJ triple lumen catheter using ultrasound guidance   2D Echo 10/23/16   Study Conclusions  - Left ventricle: The cavity size was normal. Wall thickness was increased in a pattern of moderate LVH. Systolic function was normal. The estimated ejection fraction was in the range of 55% to 60%. Wall motion was normal; there were no regional wall motion abnormalities. Features are consistent with a pseudonormal left ventricular filling pattern, with concomitant abnormal relaxation and increased filling pressure (grade 2 diastolic dysfunction). Doppler parameters are consistent with high ventricular filling pressure. - Aortic valve: Valve mobility was restricted. There was severe stenosis. There was mild to moderate  regurgitation. Valve area (VTI): 0.65 cm^2. Valve area (Vmax): 0.6 cm^2. Valve area (Vmean): 0.58 cm^2. - Mitral valve: Calcified annulus. There was mild regurgitation. - Left atrium: The atrium was mildly dilated. - Right ventricle: Systolic function was mildly reduced. - Right atrium: The atrium was mildly dilated. - Atrial septum: The septum bowed from right to left, consistent with increased right atrial pressure. - Tricuspid valve: There was moderate regurgitation. - Pulmonary arteries: Systolic pressure was moderately increased. PA peak pressure: 54 mm Hg (S).  Impressions:  - Normal LV systolic function; moderate LVH; grade 2 diastolic dysfunction with elevated LV filling pressure; mild biatrial enlargement; mildly reduced RV function; calcified aortic valve with severe AS (mean gradient 42 mmHg) and mild to moderate AI; mild MR; moderate TR with moderately elevated pulmonary pressure.     ASSESSMENT & PLAN:     1. Paroxysmal atrial fibrillation - Sinus  on exam. Eliquis increased to 5mg  BID during admission. One episode of fall without LOC at home. She now has multiple bruises that has been extending. Will check CBC. Discussed with DOD  Dr. Radford Pax --> given CKD and weigh less than 60kg (130lb today) will reduce eliquis to 2.5mg  BID. Note, unable to tolerate Coumadin. Continue amiodarone 200 mg daily.  2. Recent inferior MI - Secondary to embolism.  Detailed cath report  As above. Continue  ASA. Statin-intolerant. With sinus brady -->  Lopressor discontinued in hospital.   3. Severe AS - She is not interested in pursuing treatment of this. She would like to focus on comfort and quality of life at this point.  4. HTN - BP of 164/50 today. BP runs in 100s during dialysis. Will start low-dose amlodipine during nondialysis days.   5,. ESRD on HD (MWF)   Medication Adjustments/Labs and Tests Ordered: Current medicines are reviewed at length with the  patient today.  Concerns regarding medicines are outlined above.  Medication changes, Labs and Tests ordered today are listed in the Patient Instructions below. Patient Instructions  Medication Instructions: Decrease Eliquis to 2.5 mg twice a day. Please start Amlodipine 2.5 mg on non dialysis days. Continue all other medications as listed.  Please have blood work today (CBC)  Follow-Up: Follow up with Dr Burt Knack in 2 months.  If you need a refill on your cardiac medications before your next appointment, please call your pharmacy.  Thank you for choosing [facility]!!        Jarrett Soho, [place]  11/20/2016 3:51 PM    [facility] [place], [place],   [address] Phone: [phone]; Fax: [phone]

## 2016-11-21 LAB — CBC
Hematocrit: 29.7 % — ABNORMAL LOW (ref 34.0–46.6)
Hemoglobin: 9.3 g/dL — ABNORMAL LOW (ref 11.1–15.9)
MCH: 27.6 pg (ref 26.6–33.0)
MCHC: 31.3 g/dL — AB (ref 31.5–35.7)
MCV: 88 fL (ref 79–97)
Platelets: 140 10*3/uL — ABNORMAL LOW (ref 150–379)
RBC: 3.37 x10E6/uL — ABNORMAL LOW (ref 3.77–5.28)
RDW: 17.9 % — AB (ref 12.3–15.4)
WBC: 3 10*3/uL — ABNORMAL LOW (ref 3.4–10.8)

## 2016-12-04 ENCOUNTER — Encounter: Payer: Self-pay | Admitting: *Deleted

## 2016-12-16 DEATH — deceased

## 2017-02-03 ENCOUNTER — Telehealth: Payer: Self-pay | Admitting: Cardiovascular Disease

## 2017-02-03 NOTE — Telephone Encounter (Signed)
Patient husband calling to inform our office that patient passed away in 02-Jan-2023

## 2017-02-03 NOTE — Telephone Encounter (Signed)
Updated In [facility].

## 2017-02-03 NOTE — Telephone Encounter (Signed)
I will forward this message to medical records to update the pt's chart.

## 2017-02-05 ENCOUNTER — Ambulatory Visit: Payer: Medicare Other | Admitting: Cardiovascular Disease
